# Patient Record
Sex: Female | Born: 1945 | Race: White | Hispanic: No | Marital: Married | Smoking: Never smoker
Health system: Southern US, Community
[De-identification: ages and names within clinical notes are randomized; demographics above are authoritative.]

## PROBLEM LIST (undated history)

## (undated) DIAGNOSIS — I671 Cerebral aneurysm, nonruptured: Secondary | ICD-10-CM

## (undated) DIAGNOSIS — Z9889 Other specified postprocedural states: Secondary | ICD-10-CM

## (undated) DIAGNOSIS — M1990 Unspecified osteoarthritis, unspecified site: Secondary | ICD-10-CM

## (undated) DIAGNOSIS — E119 Type 2 diabetes mellitus without complications: Secondary | ICD-10-CM

## (undated) DIAGNOSIS — F419 Anxiety disorder, unspecified: Secondary | ICD-10-CM

## (undated) DIAGNOSIS — D649 Anemia, unspecified: Secondary | ICD-10-CM

## (undated) DIAGNOSIS — I10 Essential (primary) hypertension: Secondary | ICD-10-CM

## (undated) DIAGNOSIS — I639 Cerebral infarction, unspecified: Secondary | ICD-10-CM

## (undated) DIAGNOSIS — K469 Unspecified abdominal hernia without obstruction or gangrene: Secondary | ICD-10-CM

## (undated) HISTORY — PX: HERNIA REPAIR: SHX51

---

## 1999-08-30 ENCOUNTER — Emergency Department (HOSPITAL_COMMUNITY): Admission: EM | Admit: 1999-08-30 | Discharge: 1999-08-30 | Payer: Self-pay | Admitting: Emergency Medicine

## 1999-09-02 ENCOUNTER — Emergency Department (HOSPITAL_COMMUNITY): Admission: EM | Admit: 1999-09-02 | Discharge: 1999-09-02 | Payer: Self-pay | Admitting: Emergency Medicine

## 1999-10-23 ENCOUNTER — Encounter: Payer: Self-pay | Admitting: Internal Medicine

## 1999-10-23 ENCOUNTER — Ambulatory Visit (HOSPITAL_COMMUNITY): Admission: RE | Admit: 1999-10-23 | Discharge: 1999-10-23 | Payer: Self-pay | Admitting: Internal Medicine

## 2002-05-28 ENCOUNTER — Encounter: Payer: Self-pay | Admitting: Internal Medicine

## 2005-01-20 ENCOUNTER — Ambulatory Visit: Payer: Self-pay | Admitting: Physical Medicine & Rehabilitation

## 2005-01-20 ENCOUNTER — Inpatient Hospital Stay (HOSPITAL_COMMUNITY): Admission: EM | Admit: 2005-01-20 | Discharge: 2005-01-29 | Payer: Self-pay | Admitting: Internal Medicine

## 2005-01-20 ENCOUNTER — Encounter: Payer: Self-pay | Admitting: *Deleted

## 2005-01-20 ENCOUNTER — Ambulatory Visit: Payer: Self-pay | Admitting: Internal Medicine

## 2005-01-21 ENCOUNTER — Encounter (INDEPENDENT_AMBULATORY_CARE_PROVIDER_SITE_OTHER): Payer: Self-pay | Admitting: Cardiology

## 2005-03-25 ENCOUNTER — Ambulatory Visit: Payer: Self-pay | Admitting: Internal Medicine

## 2005-04-08 ENCOUNTER — Ambulatory Visit: Payer: Self-pay | Admitting: Internal Medicine

## 2005-05-07 ENCOUNTER — Ambulatory Visit (HOSPITAL_COMMUNITY): Admission: RE | Admit: 2005-05-07 | Discharge: 2005-05-07 | Payer: Self-pay | Admitting: Interventional Radiology

## 2005-12-20 ENCOUNTER — Ambulatory Visit (HOSPITAL_COMMUNITY): Admission: RE | Admit: 2005-12-20 | Discharge: 2005-12-20 | Payer: Self-pay | Admitting: Interventional Radiology

## 2006-03-12 ENCOUNTER — Ambulatory Visit: Payer: Self-pay | Admitting: Internal Medicine

## 2006-11-19 ENCOUNTER — Ambulatory Visit (HOSPITAL_COMMUNITY): Admission: RE | Admit: 2006-11-19 | Discharge: 2006-11-19 | Payer: Self-pay | Admitting: Interventional Radiology

## 2006-12-09 ENCOUNTER — Encounter (HOSPITAL_BASED_OUTPATIENT_CLINIC_OR_DEPARTMENT_OTHER): Payer: Self-pay | Admitting: General Surgery

## 2006-12-10 ENCOUNTER — Inpatient Hospital Stay (HOSPITAL_COMMUNITY): Admission: EM | Admit: 2006-12-10 | Discharge: 2006-12-13 | Payer: Self-pay | Admitting: Emergency Medicine

## 2007-01-30 ENCOUNTER — Ambulatory Visit: Payer: Self-pay | Admitting: Internal Medicine

## 2007-01-30 DIAGNOSIS — I10 Essential (primary) hypertension: Secondary | ICD-10-CM | POA: Insufficient documentation

## 2007-01-30 DIAGNOSIS — Z8679 Personal history of other diseases of the circulatory system: Secondary | ICD-10-CM | POA: Insufficient documentation

## 2007-01-30 DIAGNOSIS — I671 Cerebral aneurysm, nonruptured: Secondary | ICD-10-CM | POA: Insufficient documentation

## 2007-01-30 LAB — CONVERTED CEMR LAB
Bilirubin Urine: NEGATIVE
Glucose, Urine, Semiquant: NEGATIVE
Nitrite: NEGATIVE
Protein, U semiquant: NEGATIVE
Specific Gravity, Urine: 1.01
Urobilinogen, UA: 0.2
WBC Urine, dipstick: NEGATIVE

## 2007-02-06 ENCOUNTER — Ambulatory Visit: Payer: Self-pay | Admitting: Internal Medicine

## 2007-02-06 LAB — CONVERTED CEMR LAB
Cholesterol, target level: 200 mg/dL
HDL goal, serum: 40 mg/dL
LDL Goal: 130 mg/dL

## 2007-02-11 LAB — CONVERTED CEMR LAB
ALT: 44 units/L — ABNORMAL HIGH (ref 0–35)
AST: 43 units/L — ABNORMAL HIGH (ref 0–37)
Albumin: 4.1 g/dL (ref 3.5–5.2)
Alkaline Phosphatase: 43 units/L (ref 39–117)
BUN: 5 mg/dL — ABNORMAL LOW (ref 6–23)
Basophils Absolute: 0 10*3/uL (ref 0.0–0.1)
Basophils Relative: 1.1 % — ABNORMAL HIGH (ref 0.0–1.0)
Bilirubin, Direct: 0.2 mg/dL (ref 0.0–0.3)
CO2: 28 meq/L (ref 19–32)
Calcium: 9.6 mg/dL (ref 8.4–10.5)
Chloride: 106 meq/L (ref 96–112)
Cholesterol: 168 mg/dL (ref 0–200)
Creatinine, Ser: 0.8 mg/dL (ref 0.4–1.2)
Eosinophils Absolute: 0.3 10*3/uL (ref 0.0–0.6)
Eosinophils Relative: 6 % — ABNORMAL HIGH (ref 0.0–5.0)
GFR calc Af Amer: 95 mL/min
GFR calc non Af Amer: 78 mL/min
Glucose, Bld: 103 mg/dL — ABNORMAL HIGH (ref 70–99)
HCT: 39 % (ref 36.0–46.0)
HDL: 68.4 mg/dL (ref >39.0)
Hemoglobin: 13.5 g/dL (ref 12.0–15.0)
LDL Cholesterol: 82 mg/dL (ref 0–99)
Lymphocytes Relative: 28.9 % (ref 12.0–46.0)
MCHC: 34.7 g/dL (ref 30.0–36.0)
MCV: 94.5 fL (ref 78.0–100.0)
Monocytes Absolute: 0.4 10*3/uL (ref 0.2–0.7)
Monocytes Relative: 9.1 % (ref 3.0–11.0)
Neutro Abs: 2.5 10*3/uL (ref 1.4–7.7)
Neutrophils Relative %: 54.9 % (ref 43.0–77.0)
Platelets: 202 10*3/uL (ref 150–400)
Potassium: 3.9 meq/L (ref 3.5–5.1)
RBC: 4.13 M/uL (ref 3.87–5.11)
RDW: 12 % (ref 11.5–14.6)
Sodium: 143 meq/L (ref 135–145)
TSH: 3.46 microintl units/mL (ref 0.35–5.50)
Total Bilirubin: 1.8 mg/dL — ABNORMAL HIGH (ref 0.3–1.2)
Total CHOL/HDL Ratio: 2.5
Total Protein: 6.8 g/dL (ref 6.0–8.3)
Triglycerides: 86 mg/dL (ref 0–149)
VLDL: 17 mg/dL (ref 0–40)
WBC: 4.5 10*3/uL (ref 4.5–10.5)

## 2007-05-26 ENCOUNTER — Ambulatory Visit: Payer: Self-pay | Admitting: Gastroenterology

## 2007-07-20 ENCOUNTER — Ambulatory Visit: Payer: Self-pay | Admitting: Gastroenterology

## 2007-07-20 ENCOUNTER — Encounter: Payer: Self-pay | Admitting: Internal Medicine

## 2007-07-20 DIAGNOSIS — K5730 Diverticulosis of large intestine without perforation or abscess without bleeding: Secondary | ICD-10-CM | POA: Insufficient documentation

## 2008-08-08 ENCOUNTER — Telehealth: Payer: Self-pay | Admitting: Internal Medicine

## 2008-10-10 ENCOUNTER — Ambulatory Visit: Payer: Self-pay | Admitting: Internal Medicine

## 2008-10-10 LAB — CONVERTED CEMR LAB
ALT: 28 units/L (ref 0–35)
Albumin: 4.2 g/dL (ref 3.5–5.2)
Basophils Absolute: 0 10*3/uL (ref 0.0–0.1)
Blood in Urine, dipstick: NEGATIVE
CO2: 24 meq/L (ref 19–32)
Chloride: 105 meq/L (ref 96–112)
Creatinine, Ser: 0.9 mg/dL (ref 0.4–1.2)
Eosinophils Absolute: 0.3 10*3/uL (ref 0.0–0.7)
Eosinophils Relative: 5.7 % — ABNORMAL HIGH (ref 0.0–5.0)
GFR calc non Af Amer: 67.73 mL/min (ref >60)
HCT: 40.1 % (ref 36.0–46.0)
Hemoglobin: 14.1 g/dL (ref 12.0–15.0)
Ketones, urine, test strip: NEGATIVE
LDL Cholesterol: 98 mg/dL (ref 0–99)
Lymphs Abs: 1 10*3/uL (ref 0.7–4.0)
Monocytes Absolute: 0.5 10*3/uL (ref 0.1–1.0)
Neutro Abs: 3.5 10*3/uL (ref 1.4–7.7)
Platelets: 188 10*3/uL (ref 150.0–400.0)
Specific Gravity, Urine: <1.005
Urobilinogen, UA: 0.2
VLDL: 22.4 mg/dL (ref 0.0–40.0)
WBC: 5.3 10*3/uL (ref 4.5–10.5)

## 2008-10-14 ENCOUNTER — Ambulatory Visit: Payer: Self-pay | Admitting: Internal Medicine

## 2008-10-14 DIAGNOSIS — R7309 Other abnormal glucose: Secondary | ICD-10-CM

## 2009-10-16 ENCOUNTER — Encounter: Payer: Self-pay | Admitting: Internal Medicine

## 2010-05-23 ENCOUNTER — Ambulatory Visit: Payer: Self-pay | Admitting: Internal Medicine

## 2010-05-23 LAB — CONVERTED CEMR LAB
BUN: 17 mg/dL (ref 6–23)
Basophils Relative: 0.5 % (ref 0.0–3.0)
Bilirubin Urine: NEGATIVE
Bilirubin, Direct: 0.2 mg/dL (ref 0.0–0.3)
Blood in Urine, dipstick: NEGATIVE
CO2: 21 meq/L (ref 19–32)
Calcium: 9 mg/dL (ref 8.4–10.5)
Cholesterol: 233 mg/dL — ABNORMAL HIGH (ref 0–200)
Creatinine, Ser: 0.7 mg/dL (ref 0.4–1.2)
Direct LDL: 125.3 mg/dL
Eosinophils Relative: 2.9 % (ref 0.0–5.0)
GFR calc non Af Amer: 85.77 mL/min (ref >60)
Glucose, Urine, Semiquant: NEGATIVE
HCT: 41.6 % (ref 36.0–46.0)
Ketones, urine, test strip: NEGATIVE
Lymphocytes Relative: 15 % (ref 12.0–46.0)
MCHC: 34.4 g/dL (ref 30.0–36.0)
MCV: 98.3 fL (ref 78.0–100.0)
Monocytes Absolute: 0.7 10*3/uL (ref 0.1–1.0)
Monocytes Relative: 9.8 % (ref 3.0–12.0)
Neutro Abs: 5 10*3/uL (ref 1.4–7.7)
Neutrophils Relative %: 71.8 % (ref 43.0–77.0)
Platelets: 205 10*3/uL (ref 150.0–400.0)
RDW: 13.3 % (ref 11.5–14.6)
Sodium: 134 meq/L — ABNORMAL LOW (ref 135–145)
Specific Gravity, Urine: 1.015
Urobilinogen, UA: 0.2
WBC Urine, dipstick: NEGATIVE

## 2010-05-28 ENCOUNTER — Ambulatory Visit: Payer: Self-pay | Admitting: Internal Medicine

## 2010-07-29 ENCOUNTER — Encounter: Payer: Self-pay | Admitting: Interventional Radiology

## 2010-08-07 NOTE — Assessment & Plan Note (Signed)
Summary: cpx//lch   Vital Signs:  Patient profile:   65 year old female Height:      61.5 inches Weight:      177 pounds BMI:     33.02 Temp:     99.0 degrees F oral Pulse rate:   80 / minute Pulse rhythm:   regular BP sitting:   134 / 88  (left arm) Cuff size:   regular  Vitals Entered By: Alfred Levins, CMA (2010/06/25 10:21 AM)  History of Present Illness: CPX has not lost weight as instructed.   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 One Two Times A Day--Needs Office Visit For Additional Refills 2)  Simvastatin 40 Mg  Tabs (Simvastatin) .... One By Mouth Daily 3)  Aspirin 325 Mg  Tbec (Aspirin) .... One Daily 4)  Benazepril Hcl 20 Mg  Tabs (Benazepril Hcl) .... One Daily--Needs Office Visit For Additional Refills 5)  Alprazolam 0.25 Mg Tabs (Alprazolam) .Marland Kitchen.. 1 By Mouth Once Daily As Needed Anxiety 6)  Rogaine Extra Strength 5 %  Soln (Minoxidil) .... Appy Three Times A Day  Allergies (verified): 1)  ! Keflex (Cephalexin) 2)  ! Phenobarbital 3)  ! Penicillin 4)  ! Erythromycin  Past History:  Past Medical History: Last updated: 02/06/2007 Hyperlipidemia Hypertension Cerebrovascular accident, hx of-hemorrhagic cerebral aneurysm abdominal hernia--strangulated hernia  Past Surgical History: Last updated: 08/17/2007 cerebral aneurysm-coiling after stroke strangulated hernia-abdomen-peritonitis required partial colectomy  Family History: Last updated: 06/25/2010 father deceased pnacreatic CA-84 yo mother dementia (living at age 52)  Social History: Last  updated: 02/06/2007 Married Alcohol use-no--she has drunk too much before Regular exercise-yes  Risk Factors: Exercise: yes (02/06/2007)  Risk Factors: Smoking Status: never (01/30/2007)  Family History: father deceased pnacreatic CA-84 yo mother dementia (living at age 40)  Physical Exam  General:  overweight female in no acute distress. HEENT exam atraumatic, normocephalic symmetric her muscles are intact. Neck supple without lymphadenopathy or thyromegaly. Chest auscultation. Cardiac exam S1-S2 are regular. Abdominal exam active bowel sounds, soft, nontender, nondistended. She is overweight. Extremities no clubbing cyanosis or edema. Neurologic exam she is alert and oriented without any motor or sensory deficits. speech is normal. Gait is normal.   Impression & Recommendations:  Problem # 1:  WELL ADULT EXAM (ICD-V70.0) health maint utd needs to exercise regularly lose weight low calorie diet.   Problem # 2:  HYPERGLYCEMIA (ICD-790.29) will need followup  Problem # 3:  CEREBROVASCULAR ACCIDENT, HX OF (ICD-V12.50) no recurrence.  Problem # 4:  DYSARTHRIA-STROKE (ICD-784.5) has essentially resolved.  Problem # 5:  HYPERTENSION (ICD-401.9) care control. Will dramatically improve with weight loss. Her updated medication list for this problem includes:    Labetalol Hcl 100 Mg Tabs (Labetalol hcl) ..... One two times a day--needs office visit for additional refills    Benazepril Hcl 20 Mg Tabs (Benazepril hcl) ..... One daily--needs office visit for additional refills  BP today: 134/88 Prior BP: 148/98 (10/14/2008)  Prior 10 Yr Risk Heart Disease: Not enough information (02/06/2007)  Labs Reviewed: K+: 4.3 (05/23/2010) Creat: : 0.7 (05/23/2010)   Chol: 233 (05/23/2010)   HDL:  99.30 (05/23/2010)   LDL: 98 (10/10/2008)   TG: 98.0 (05/23/2010) discussed weight loss at length. She'll followup with me in 3 months with a goal of a 10-15 pound weight lossduring that  time.  Complete Medication List: 1)  Labetalol Hcl 100 Mg Tabs (Labetalol hcl) .... One two times a day--needs office visit for additional refills 2)  Simvastatin 40 Mg Tabs (Simvastatin) .... One by mouth daily 3)  Aspirin 325 Mg Tbec (Aspirin) .... One daily 4)  Benazepril Hcl 20 Mg Tabs (Benazepril hcl) .... One daily--needs office visit for additional refills 5)  Alprazolam 0.25 Mg Tabs (Alprazolam) .Marland Kitchen.. 1 by mouth once daily as needed anxiety 6)  Rogaine Extra Strength 5 % Soln (Minoxidil) .... Appy three times a day  Preventive Care Screening  Mammogram:    Date:  07/08/2009    Next Due:  05/2012    Results:  normal   Pap Smear:    Date:  05/08/2010    Next Due:  05/2013    Results:  normal-pt's report   Colonoscopy:    Next Due:  07/2017   Patient Instructions: 1)  Please schedule a follow-up appointment in 3 months. 2)  labs one week prior to visit 3)  lipids---272.4 4)  lfts-995.2 5)  bmet-995.2 6)  A1C-250.02 7)       Orders Added: 1)  Est. Patient 40-64 years [99396]    Preventive Care Screening  Mammogram:    Date:  07/08/2009    Next Due:  05/2012    Results:  normal   Pap Smear:    Date:  05/08/2010    Next Due:  05/2013    Results:  normal-pt's report   Colonoscopy:    Next Due:  07/2017

## 2010-08-07 NOTE — Letter (Signed)
Summary: Medical Clearance for Exercise Program  Medical Clearance for Exercise Program   Imported By: Maryln Gottron 10/17/2009 15:39:06  _____________________________________________________________________  External Attachment:    Type:   Image     Comment:   External Document

## 2010-08-22 ENCOUNTER — Other Ambulatory Visit: Payer: Self-pay

## 2010-08-29 ENCOUNTER — Ambulatory Visit: Payer: Self-pay | Admitting: Internal Medicine

## 2010-09-25 ENCOUNTER — Other Ambulatory Visit: Payer: Self-pay

## 2010-10-02 ENCOUNTER — Ambulatory Visit: Payer: Self-pay | Admitting: Internal Medicine

## 2010-10-19 ENCOUNTER — Other Ambulatory Visit: Payer: Self-pay | Admitting: Internal Medicine

## 2010-10-22 ENCOUNTER — Other Ambulatory Visit: Payer: Self-pay | Admitting: *Deleted

## 2010-10-22 NOTE — Telephone Encounter (Signed)
Opened in error

## 2010-10-26 ENCOUNTER — Other Ambulatory Visit: Payer: Self-pay

## 2010-11-01 ENCOUNTER — Other Ambulatory Visit: Payer: Self-pay | Admitting: Internal Medicine

## 2010-11-02 ENCOUNTER — Ambulatory Visit: Payer: Self-pay | Admitting: Internal Medicine

## 2010-11-16 ENCOUNTER — Other Ambulatory Visit: Payer: Self-pay | Admitting: Internal Medicine

## 2010-11-20 NOTE — Op Note (Signed)
NAME:  [name] [name], [name] [name]           ACCOUNT NO.:  [id number]   MEDICAL RECORD NO.:  [id number]          PATIENT TYPE:  INP   LOCATION:  1527                         FACILITY:  [facility]   PHYSICIAN:  Leonie Man, M.D.   DATE OF BIRTH:  11/23/1945   DATE OF PROCEDURE:  12/09/2006  DATE OF DISCHARGE:  12/13/2006                               OPERATIVE REPORT   PREOPERATIVE DIAGNOSIS:  Strangulated umbilical hernia.   POSTOPERATIVE DIAGNOSIS:  Strangulated umbilical hernia.   PROCEDURE:  Exploratory laparotomy, small bowel resection with primary  anastomosis and primary repair of umbilical hernia.   SURGEON:  Leonie Man, M.D.   ASSISTANT:  Alfonse Ras, MD.   ANESTHESIA:  General.   INDICATIONS:  The patient is a very pleasant 65 year old female  presenting to the hospital with a three-day history of worsening  abdominal pain and an incarcerated umbilical hernia.  The patient had  the hernia for approximately four years, did not choose to have it  paired.  She comes in with nausea and vomiting over the past 24 hours.  I was called to evaluate the patient and noted her to have a large  incarcerated hernia with a rather darkened skin overlying the hernia.  The patient is prepared and taken to the operating room after risks and  potential benefits of surgery have been discussed with her.  All  questions answered, consent obtained.   PROCEDURE:  The patient is positioned supinely following the induction  of satisfactory general anesthesia and the abdomen is prepped and draped  to be included in a sterile operative field.  An elliptical incision is  carried down around the umbilicus, deepened through the skin and  subcutaneous tissue, carrying the dissection down to the fascia and the  anterior abdominal wall whereby this was further opened up as to release  the hernia.  Within the been the hernia sac there was a portion of  necrotic small intestine which was mobilized, brought  out into the wound  and this area was divided and transected back to normal small intestine  the intervening mesentery was taken between clamps and secured with ties  of 2-0 silk.  A functional end-to-end anastomosis was carried out with a  GIA and TA-60 stapling device.  Mesenteric defect closed with  interrupted 3-0 silk sutures.  This area was returned to the abdominal  cavity and the abdominal cavity was thoroughly irrigated with multiple  aliquots of normal saline.  No other areas of necrosis were found within  the abdomen.  We chose not to put any mesh repair in because of the  presence of necrotic bowel.  The fascia was then closed transversely  with interrupted sutures of #1 Novofil.  Subcutaneous  tissues were irrigated and the skin was closed with a running  subcuticular stitch of 4-0 Monocryl.  Sterile dressings were applied.  The anesthetic reversed.  The patient removed from the operating room to  the recovery room in stable condition.  Tolerated the procedure well.      Leonie Man, M.D.  Electronically Signed     PB/MEDQ  D:  12/31/2006  T:  12/31/2006  Job:  [id number]   cc:   [facility]

## 2010-11-20 NOTE — H&P (Signed)
NAME:  [name] [name], [name] [name]           ACCOUNT NO.:  [id number]   MEDICAL RECORD NO.:  [id number]          PATIENT TYPE:  OBV   LOCATION:  0110                         FACILITY:  [facility]   PHYSICIAN:  Alfonse Ras, MD   DATE OF BIRTH:  28-Nov-1945   DATE OF ADMISSION:  12/09/2006  DATE OF DISCHARGE:                              HISTORY & PHYSICAL   ADMISSION DIAGNOSES:  Incarcerated umbilical hernia.   REFERRING PHYSICIAN:  Bethann Berkshire, MD.   HISTORY OF PRESENT ILLNESS:  The patient is a very pleasant 65 year old  white female with a 3 to 4-day history of worsening abdominal discomfort  and umbilical hernia.  The patient has had this hernia for about 4 years  and has chosen not to have it repaired. She has had nausea and vomiting,  particularly over the last 24 hours.  She was seen by Dr. Valeria Batman,  and I was called to evaluate the patient.   PAST MEDICAL HISTORY:  Significant for CVA in July 2006, and she had a  coil angioplasty done at that time.   ALLERGIES:  IV DYE.   MEDICATIONS:  1. Labetalol 100 mg daily.  2. Simvastatin.  3. Benazepril.   PHYSICAL EXAMINATION:  VITAL SIGNS:  On admission, her blood pressure  was 77/34; however, now it is 142/80.  Temperature was 98.6 on  admission. Heart rate was 118 and is now down to 86.  Respiratory rate  is 16.  HEENT:  Exam benign, normocephalic and atraumatic.  Pupils equal, round,  and reactive to light.  NECK:  Supple and soft without thyromegaly or cervical adenopathy.  LUNGS:  Clear to auscultation and percussion x2.  ABDOMEN:  Shows a very large umbilical hernia which is incarcerated.  Even with conscious sedation, I am unable to reduce it.  EXTREMITIES:  No clubbing, cyanosis, or edema.   White blood cell count 9.1.  Electrolytes are all within normal limits.   KUB does not show evidence of small-bowel obstruction.   IMPRESSION:  Incarcerated umbilical hernia.   PLAN:  Repair with possible mesh during this  hospitalization.      Alfonse Ras, MD  Electronically Signed     KRE/MEDQ  D:  12/09/2006  T:  12/09/2006  Job:  [id number]

## 2010-11-20 NOTE — Discharge Summary (Signed)
NAME:  [name] [name], [name] [name]           ACCOUNT NO.:  [id number]   MEDICAL RECORD NO.:  [id number]          PATIENT TYPE:  INP   LOCATION:  1527                         FACILITY:  [facility]   PHYSICIAN:  Leonie Man, M.D.   DATE OF BIRTH:  Jan 14, 1946   DATE OF ADMISSION:  12/09/2006  DATE OF DISCHARGE:  12/13/2006                               DISCHARGE SUMMARY   ADMISSION DIAGNOSIS:  Strangulated umbilical hernia.   DISCHARGE DIAGNOSIS:  Strangulated umbilical hernia.   PROCEDURES IN HOSPITAL:  1. Small bowel resection.  2. Repair of strangulated umbilical hernia.   COMPLICATIONS:  None.   CONDITION ON DISCHARGE:  Improved.   HISTORY AND HOSPITAL COURSE:  The patient is a 65 year old female with a  history of worsening abdominal pain and known umbilical hernia, which  she chose not to get fixed over the past several years.  Upon admission,  the patient was noted to be having nausea and vomiting for the ensuing  24 hours.  On seeing the patient, she was noted to have a very red,  angry umbilicus, and she was taken to the operating room immediately for  emergent surgery.  At the time of surgery, she was noted to have  gangrenous bowel within the strangulated portion of the hernia.  This  was resected at the time of surgery, and the hernia was repaired.  Her  postoperative course has been benign with normal resumption of diet and  activity, and she is being discharged now to be followed up in the  office in two weeks.   DISCHARGE MEDICATIONS:  Mepergan one to two every four hours p.r.n.   ACTIVITY:  As tolerated.   DIET:  Unrestricted.      Leonie Man, M.D.  Electronically Signed     PB/MEDQ  D:  02/05/2007  T:  02/06/2007  Job:  [id number]

## 2010-11-20 NOTE — Assessment & Plan Note (Signed)
[facility]                         GASTROENTEROLOGY OFFICE NOTE   NAME:[name], [name] PAVONE                  MRN:          [id number]  DATE:05/26/2007                            DOB:          [birth year]    REFERRING PHYSICIAN:  Valetta Mole. Swords, MD   REASON FOR REFERRAL:  Dr. Cato Mulligan asked me to evaluate [name] [name] in  consultation regarding colorectal cancer screening with colonoscopy.   HISTORY OF PRESENT ILLNESS:  [name] [name] is a very pleasant, 65 year old  woman who had an incarcerated and strangulated umbilical hernia this  past summer. She was admitted with abdominal pain, nausea and vomiting  and was found to have an incarcerated umbilical hernia and went for  emergency surgery. She had 12 cm of small bowel resected, a primary  anastomosis and a repair of her umbilical hernia. Her postoperative  course was fairly routine although she did have a small hematoma and  some infection that required 2-3 weeks of antibiotics. The past 2-3  months she has felt very well. She has no troubles with constipation,  diarrhea or rectal bleeding. She never had a colonoscopy for colorectal  cancer screening.   REVIEW OF SYSTEMS:  Notable for an 80 pound weight loss in the past 2  years. This is all intentional. She had an aneurysm in her brain in 2006  and since then has really been very health conscious. The rest of her  review of systems essentially normal and is available on her nursing  intake sheet.   PAST MEDICAL HISTORY:  Cranial aneurysm which bled in 2006 treated with  coiling. Hypertension, elevated cholesterol, anxiety, umbilical hernia  repaired after strangulation.   CURRENT MEDICATIONS:  Labetalol, simvastatin, aspirin, benazepril and  alprazolam.   SOCIAL HISTORY:  Married with 2 children, works as a Runner, broadcasting/film/video, nonsmoker,  drinks wine occasionally.   FAMILY HISTORY:  Sister with Crohn's disease. No colon cancer in family.   PHYSICAL  EXAMINATION:  VITAL SIGNS:  5 foot 5 inches, 149 pounds, blood  pressure 108/68, pulse 72.  CONSTITUTIONAL:  Generally well-appearing.  NEUROLOGIC:  Alert and oriented x3.  EYES:  Extraocular movements intact.  MOUTH:  Oropharynx moist, no lesions.  NECK:  Supple, no lymphadenopathy.  CARDIOVASCULAR:  Heart regular rate and rhythm.  LUNGS:  Clear to auscultation bilaterally.  ABDOMEN:  Soft, nontender, nondistended, normal bowel sounds. Small  horizontal low abdominal incision is healing nicely.  SKIN:  No rashes or lesions on visible extremities.  EXTREMITIES:  No lower extremity edema.   ASSESSMENT/PLAN:  A 65 year old woman at routine risk for colorectal  cancer.   I do not think that her recent strangulated umbilical hernia surgery  should impact a colonoscopy. We will therefore arrange for her to have a  colonoscopy performed at her soonest convenience. She wants to wait  until after her son's wedding which is about 3 weeks from now. I see no  reason for any further lab tests or imaging studies prior to then.     Rachael Fee, MD  Electronically Signed    DPJ/MedQ  DD: 05/26/2007  DT: 05/27/2007  Job #:  [id number]   cc:   Valetta Mole. Swords, MD

## 2010-11-23 ENCOUNTER — Other Ambulatory Visit: Payer: Self-pay

## 2010-11-23 NOTE — Consult Note (Signed)
NAME:  [name] [name], [name] [name]           ACCOUNT NO.:  [id number]   MEDICAL RECORD NO.:  [id number]          PATIENT TYPE:  OUT   LOCATION:  XRAY                         FACILITY:  [facility]   PHYSICIAN:  Sanjeev K. Deveshwar, M.D.DATE OF BIRTH:  [birth year]   DATE OF CONSULTATION:  02/18/2005  DATE OF DISCHARGE:                                   CONSULTATION   This is a very pleasant 65 year old female who was admitted to [facility] on January 20, 2005, by Dr. Felicity Coyer after the patient presented with  stroke-like symptoms consisting of mild aphasia and right facial weakness as  well as some dysarthria.  A CT scan was performed that showed a possible  subacute left CVA.  The patient was seen by Dr. Orlin Hilding and admitted.  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  Dr. Corliss Skains was asked to see the patient in consultation. She was seen on  January 25, 2005, and arrangements were made for coiling of the aneurysm to be  performed January 28, 2005.  The patient did undergo coiling of the aneurysm on  the above date.  She tolerated this well.  The aneurysm was successfully  obliterated, and the patient was discharged from the hospital on January 29, 2005, in improved and stable condition.   On admission to the hospital initially, the patient's blood pressure was  severely elevated at 234/122.  This was treated during her hospital stay.  She was also noted to have some spasm of her cerebral arteries during her  angiogram, and this was treated with nimodipine.   PAST MEDICAL HISTORY:  Significant for:  1.  Hypertension.  2.  Hyperlipidemia.  3.  Above-noted aneurysm with possible CVA.   ALLERGIES:  PENICILLIN and ERYTHROMYCIN.   CURRENT MEDICATIONS:  1.  Labetalol 100 mg b.i.d.  2.  Aspirin 325 mg daily.  3.  Lotensin 10 mg daily.  4.  Nimodipine 60 mg q.4h.  5.  Zocor 20 mg at bedtime.   In the office today, the patient is accompanied by her husband.  She  states  she is doing well.  She still has some residual aphasia with some word-  finding problems on occasion.  Overall, she feels her speech is improving.  She did meet with a speech therapist who felt that no formal speech therapy  was indicated at this time; however, if the patient did not show  considerable improvement, she felt that speech therapy might be a  consideration in the not too distant future.   The patient apparently had appointment to see Dr. Cato Mulligan in followup;  however, she missed this as she was at the beach.  She has been having her  blood pressure monitored by a home health nurse, and apparently her blood  pressure has been under good control.  She is due to see Dr. Pearlean Brownie back  September 25. Dr. Corliss Skains has recommended that the patient have  transcranial Dopplers performed to look for further spasm and to see if the  patient could come off the nimodipine.   Dr. Corliss Skains also  thought the patient might benefit from MRI/MRA.  He also  recommended a repeat angiogram three months following her initial coiling on  July 24.  Overall, the patient was felt to be doing well at this time.  Called Dr. Marlis Edelson office today to confirm her followup appointment with  him.  We have also recommended that she reschedule with Dr. Cato Mulligan.   It should be noted that greater than 30 minutes was spent on this consult  today.      Markus.Osmond   DR/MEDQ  D:  02/18/2005  T:  02/18/2005  Job:  [id number]   cc:   Valetta Mole. Swords, M.D. [facility]   Pramod P. Pearlean Brownie, MD  Fax: [phone]

## 2010-11-23 NOTE — H&P (Signed)
NAME:  [name] [name], [name] [name] NO.:  [id number]   MEDICAL RECORD NO.:  [id number]          PATIENT TYPE:  EMS   LOCATION:  ED                           FACILITY[facility]  [facility]   PHYSICIAN:  Wanda Plump, MD [facility]    DATE OF BIRTH:  1945-07-23   DATE OF ADMISSION:  01/20/2005  DATE OF DISCHARGE:                                HISTORY & PHYSICAL   ADDENDUM:  I have discussed the case of [name] with the stroke team.  We will  transfer her to [facility] We will aim toward blood pressure goal of around  160/100 until we rule out tight intracranial arterial stenosis.       JEP/MEDQ  D:  01/20/2005  T:  01/21/2005  Job:  [id number]

## 2010-11-23 NOTE — Consult Note (Signed)
NAME:  [name] [name], [name] [name]           ACCOUNT NO.:  [id number]   MEDICAL RECORD NO.:  [id number]          PATIENT TYPE:  INP   LOCATION:  3022                         FACILITY:  [facility]   PHYSICIAN:  Sanjeev K. Deveshwar, M.D.DATE OF BIRTH:  1945/07/25   DATE OF CONSULTATION:  01/25/2005  DATE OF DISCHARGE:                                   CONSULTATION   BRIEF HISTORY:  This is a pleasant 65 year old female who has a previous  history of hypertension but has, otherwise, been fairly healthy.  She  presented to [facility] Emergency Room on January 20, 2005, and was  admitted by Dr. Drue Novel with stroke like symptoms consisting of mild aphasia,  right facial weakness, and dysarthria.  Her symptoms resolved within the  hour except for the aphasia, although in the emergency room, CT scan showed  a possible subacute left CVA.  The patient was seen in consultation by Dr.  Orlin Hilding who felt that the patient may have suffered a left CVA, as well.  The patient had an MRA as well as an MRI performed.  This showed marked  tortuosity of both proximal internal carotid arteries as well as the  proximal vertebral arteries, there was focal stenosis of the proximal left  A1 segment, there were atherosclerotic changes of the M1 segment with  diminished flow distally and focal hemorrhage at the M2 bifurcation.  The  patient was seen by Dr. 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  It was felt that this  could possibly represent vasospasm.  The findings were discussed with the  patient and a decision has been made to proceed with coiling of the  aneurysm.  This has been scheduled for January 28, 2005.   PAST MEDICAL HISTORY:   Significant for hypertension, the patient has  previously been on an anti-hypertensive medication, however, she had stopped  this on her own and at the time of admission, her blood pressure was  initially 234/122.  She has had a lipid profile during her stay which  reveals hyperlipidemia.  She had a 2D echo performed July 17 that reveals  ejection fraction of 55-65%.  There was also heavy mitral annular  calcification of the posterior mitral valve annulus and mild to moderate  calcification of the anterior mitral valve annulus.  She apparently is going  through menopause at this time, as well.   ALLERGIES:  Penicillin and erythromycin.   CURRENT MEDICATIONS:  Labetalol, aspirin, Lotensin, Nimodipine, and Zocor.   PAST SURGICAL HISTORY:  The patient has had no surgeries.   SOCIAL HISTORY:  The patient is married, she lives in [place].  She has  two children.  She has never smoked.  She drinks alcohol occasionally.  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   FAMILY HISTORY:  Both parents are alive and in their 12s, her mother has  Alzheimer's dementia, her father is healthy and still works.   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  The aphasia continues.   PHYSICAL EXAMINATION:  GENERAL:  Pleasant 65 year old white female in no acute distress.  VITAL SIGNS:  Blood pressure 145/86, pulse 70, respirations 20, temperature  98.2, oxygen saturation 97% on room air.  HEENT:  Unremarkable.  NECK:  No bruits, no jugular venous distention.  HEART:  Regular rate and rhythm without murmur.  LUNGS:  Clear.  ABDOMEN:  Slightly obese, soft, nontender.  EXTREMITIES:  Pulses to be weak but intact, there is no  significant edema.  SKIN:  Cool and dry.  NEUROLOGICAL:  The patient is alert and oriented, follows commands.  She has  some word finding difficulties.  Cranial nerves 2-12 are grossly intact.  Sensation is intact to light touch.  Motor strength is approximately 4-5/5,  equal and symmetrical, upper and lower extremities and right and left.  Cerebellar testing is intact.  Her airway is rated at a 2 to 3.  Her ASA  scale is a 2.   IMPRESSION:  1.  Recent stroke like symptoms resolved except for mild residual aphasia.  2.  Cerebral angiogram January 22, 2005, revealing a left middle cerebral      artery aneurysm with question of vasospasm, please see details as noted      above.  3.  History of hypertension recently untreated.  4.  Hyperlipidemia.  5.  Recent echo with normal ejection fraction with mitral annular      calcification.  6.  Menopause.  7.  Allergies to penicillin and erythromycin.   PLAN:  As noted, the risks and benefits of aneurysm coiling have been  discussed with the patient.  She has agreed to proceed.  This has been  scheduled for January 28, 2005, to be performed by Dr. Corliss Skains under general  anesthesia.      Markus.Osmond   DR/MEDQ  D:  01/25/2005  T:  01/25/2005  Job:  [id number]   cc:   Valetta Mole. Swords, M.D. [facility] A. Orlin Hilding, M.D.  [address]  [address]  [place]  [place] [address]  Fax: [phone]

## 2010-11-23 NOTE — Consult Note (Signed)
NAME:  [name] [name], [name] [name]           ACCOUNT NO.:  [id number]   MEDICAL RECORD NO.:  [id number]          PATIENT TYPE:  INP   LOCATION:  3102                         FACILITY:  [facility]   PHYSICIAN:  Gustavus Messing. Orlin Hilding, M.D.DATE OF BIRTH:  03-17-1946   DATE OF CONSULTATION:  01/20/2005  DATE OF DISCHARGE:                                   CONSULTATION   CHIEF COMPLAINT:  Right facial droop and garbled speech.   HISTORY OF PRESENT ILLNESS:  Ms. Uber is a 65 year old right-handed white  woman with a history of hypertension which has not been treated for the last  2 years with about a 24-hour history of right facial weakness and garbled  speech and problems with writing.  She denies headache, vision changes, or  extremity weakness.   REVIEW OF SYSTEMS:  Positive for some flu-like symptoms earlier this week.   PAST MEDICAL HISTORY:  Significant for the hypertension which has not been  treated for the last week.  She has obesity.  No history of diabetes,  coronary artery disease.  No surgery, no previous stroke.   MEDICATIONS:  She is not taking any routinely.   ALLERGIES:  PENICILLIN, possibly ERYTHROMYCIN.   SOCIAL HISTORY:  She is married.  She does drink alcohol, two drinks per  day.  No cigarette use.   FAMILY HISTORY:  Noncontributory.   PHYSICAL EXAMINATION:  VITAL SIGNS:  Temperature 99.4, pulse 80,  respirations 18, blood pressure 212/120 currently.  HEENT:  Head is normocephalic and atraumatic.  NECK:  Supple without bruits.  HEART:  Regular rate and rhythm.  NEUROLOGY:  Mental status; she is awake and alert.  She has fluent speech,  but with many paraphasic errors.  Poor name, poor repetition, and mildly  impaired comprehension.  Clearly aphasic.  Cranial nerves; her pupils are  equal and reactive.  Visual fields are full.  Extraocular movements are  intact.  Facial sensation is normal at present without any obvious facial  droop.  Hearing is intact.  Palate is  symmetric and tongue is midline.  Motor examination; there is no drift in the upper or lower extremities.  She  has normal rapid eye movement.  No orbiting.  She has normal strength of 5/5  strength in all four extremities.  Deep tendon reflexes are 1+ with  downgoing toes bilaterally.  Coordination; finger-to-nose and heel-to-shin  are intact.  Sensory is normal.   CT scan of the brain shows no definite acute abnormalities, but there is a  question of a posterior left temporal hypodensity.   IMPRESSION:  1.  Left brain event, likely infarct with aphasia.  No weakness.  2.  Hypertension.  It may be that she has a tight stenosis in the distal      left ICA or MCA and requires high perfusion pressures.   RECOMMENDATIONS:  Aspirin 325 mg daily until workup is complete.  MRI, MRA,  two-dimensional echocardiogram.  I would not be too aggressive at lowering  the blood pressure as she may require higher perfusion pressures, but would  certainly address this more aggressively once the etiology of her stroke  is  obvious.       CAW/MEDQ  D:  01/20/2005  T:  01/21/2005  Job:  [id number]

## 2010-11-23 NOTE — Discharge Summary (Signed)
NAME:  KEIANNA, SIGNER           ACCOUNT NO.:  [id number]   MEDICAL RECORD NO.:  [id number]          PATIENT TYPE:  INP   LOCATION:  3107                         FACILITY:  [facility]   PHYSICIAN:  Rene Paci, M.D. LHCDATE OF BIRTH:  19-Feb-1946   DATE OF ADMISSION:  01/20/2005  DATE OF DISCHARGE:  01/29/2005                                 DISCHARGE SUMMARY   DISCHARGE DIAGNOSES:  1.  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  2.  Dyslipidemia.  3.  Hypertension.   HISTORY OF PRESENT ILLNESS:  The patient is a 65 year old female with past  medical history including hypertension who presented to [facility] Emergency  Room on January 20, 2005, with stroke-like symptoms consisting of mild aphagia,  right facial weakness and dysarthria.  The patient underwent the CT scan in  the ED which showed possible subacute left CVA.  The patient also described  at the time of admission flu-like symptoms for 7-8 days prior to admission.  The patient was admitted for further evaluation.   PAST MEDICAL HISTORY:  Hypertension.   HOSPITAL COURSE:  #1 - LMCA BRANCH INFARCT FROM LMCA ANEURYSM STATUS POST  EMBOLIZATION January 28, 2005.  Patient underwent CT of the head in the  emergency room which showed an area of low attenuation in the posterior left  temporal region consistent with acute to subacute ischemia.  A neurology  consult was obtained, and the patient also underwent an MRA of the head and  neck which revealed a diffuse irregularity of the left M2 segments with  attenuating flow in the distal M2-M3 branches with focal hemorrhage of the  M2 bifurcation.  Neurology felt that the patient likely had an embolic LMCA  branch infarct.  However, a 2D echo was performed which should not reveal  any thrombus and revealed a normal ejection fraction of 55-65%.  The patient  underwent carotid artery Duplex which showed no stenosis.  Interventional  radiology was  consulted and an angiogram was performed which revealed a 9  mmx67mm saccular aneurysm in the left MCA trifurcation with irregularity in  the fundus.   It was suggested that the patient undergo an embolization of the aneurysm.  However, it was necessary for the patient to wait 7-10 days from time of  initial event prior to embolization.  As a result, the patient was  maintained for optimization of blood pressure control and close monitoring.  The patient underwent embolization on January 28, 2005, with Dr. Corliss Skains from  Interventional Radiology.  At time of discharge, the patient has no focal  neurological deficits.   #2 - HYPERTENSION.  The patient was placed on Labetalol, Lotensin and  Nimodipine for blood pressure control.  Per recommendation of Neurology, the  patient is to be maintained on current medications for blood pressure  management.  Systolic blood pressure goal is 120-140.  In addition, the  patient will be discharged home on aspirin per request of Neurology.   #3 - DYSLIPIDEMIA.  The patient was started on Statin.  Will be discharged  home on Zocor  20 mg q.h.s.   DISCHARGE MEDICATIONS:  1.  Labetalol 100 mg p.o. b.i.d., prescription given for 60 tablets and one      refill.  2.  Aspirin 325 mg p.o. daily.  3.  Lotensin 10 mg p.o. daily.  Prescription given for 30 tablets with one      refill.  4.  Nimodipine 60 mg p.o. q.4h.  Prescription given for 180 tablets with one      refill.  5.  Zocor 20 mg p.o. q.h.s.  Prescription given for 30 tablets with one      refill.   LABORATORY DATA:  At time of discharge, hemoglobin 11.8, hematocrit 34.2,  white blood cell count 6.3, platelets 280,000.  BUN 4, creatinine 0.9,  glucose 136.   FOLLOWUP:  1.  The patient is to follow up with Dr. Corliss Skains on August 14 at 2:00 p.m.  2.  The patient is to follow up with Dr. Birdie Sons on August 10 at 10:15      a.m.  3.  The patient is to follow up with Dr. Pearlean Brownie from Neurology on  September      25 at 12 noon.  She is to arrive at 11:30 a.m. for that appointment.   DISCHARGE INSTRUCTIONS:  The patient will need very close monitoring of her  blood pressure as an outpatient.       MSO/MEDQ  D:  01/29/2005  T:  01/29/2005  Job:  [id number]   cc:   Grandville Silos. Corliss Skains, M.D.  [address] [place]  [place]  [place] [address]  Fax: [phone]   Pramod P. Pearlean Brownie, MD  Fax: [phone]   Valetta Mole. Swords, M.D. [facility]

## 2010-11-23 NOTE — H&P (Signed)
NAME:  [name], [name] NO.:  [id number]   MEDICAL RECORD NO.:  [id number]          PATIENT TYPE:  EMS   LOCATION:  ED                           FACILITY[facility]  [facility]   PHYSICIAN:  Wanda Plump, MD [facility]    DATE OF BIRTH:  [birth year]   DATE OF ADMISSION:  01/20/2005  DATE OF DISCHARGE:                                HISTORY & PHYSICAL   PRIMARY CARE PHYSICIAN:  Dr. Cato Mulligan at [facility].   CHIEF COMPLAINT:  Stroke.   HISTORY OF PRESENT ILLNESS:  [name] is a 65 year old white female who  came to the emergency room for evaluation of a possible stroke.  She is here  with her husband.  They state that, yesterday, she developed right facial  weakness along with difficulty finding her words, what seems to be  expressive aphagia.  The facial weakness resolved within an hour and the  difficulty finding her words improved gradually to a point today that it is  almost gone.  At the emergency room, she was found to have an acute to  subacute stroke on the CT and she will be admitted.   PAST MEDICAL HISTORY:  1.  Hypertension.  The E-chart showed that she had a negative captopril      renal scan in 2001.  2.  She is G2, P2.  3.  She is on her menopause.   FAMILY HISTORY:  1.  Sister has diabetes and cardiovascular diseases.  2.  Grandmother had breast cancer.  3.  No history of colon cancer.  4.  Questionable history of lung cancer.   SOCIAL HISTORY:  Does not smoke and drinks socially.   REVIEW OF SYSTEMS:  The patient and her husband had flu-like symptoms for  the last 7-8 days characterized by fever, on and off, nausea.  They had  vomiting and diarrhea but that had subsided.  Overall, she is feeling  better.  She did take naproxen yesterday for above symptoms.  She denies any  recent headache, dizziness, diplopia or bladder or bowel incontinence.  No  chest pain, shortness of breath or cough.   MEDICATIONS:  She was supposed to be on hypertensive medicine  but she quit a  long time ago.   ALLERGIES:  1.  PENICILLIN.  2.  Questionable ERYTHROMYCIN allergies.   PHYSICAL EXAMINATION:  VITAL SIGNS:  The patient has a temperature of 100.4.  Pulse initially was 102, after labetalol is 74.  Blood pressure initially  was 234/122 and repeated blood pressures is 186/121.  Respirations 20.  O2  saturation 100% on room air.  GENERAL:  The patient is alert, oriented, and in no apparent distress.  NECK:  She has good carotid pulses bilaterally.  LUNGS:  Clear to auscultation bilaterally.  CARDIOVASCULAR:  Regular rate and rhythm without a murmur.  ABDOMEN:  Not distended, soft, good bowel sounds and no organomegaly.  EXTREMITIES:  She has good bilateral femoral and pedal pulses.  No edema.  NEUROLOGIC:  Extraocular movements are intact.  Pupils are equal and  reactive to light and accommodation.  Motor exam is normal including  face  and tongue.  Her memory seems intact.  Her speech seems normal at this time.   LABORATORY DATA:  CT of the head showed wedge-like changes at the left  temporal area consistent with acute to subacute ischemic stroke with some  edema.  White count is 6.2, hemoglobin 14.3, platelets 259, creatinine 0.9,  potassium 3.2.  Blood sugar 114.  LFTs are normal.  INR is normal.  EKG is  negative.   ASSESSMENT AND PLAN:  1.  The patient has developed a stroke.  She will be admitted.  We will get      an echo and carotid ultrasound as well as a fasting lipid profile.  Will      start aspirin at some point.  Will get her blood pressure under better      control.  At this time, the short-term goal will be to be around 140/90.  2.  Hypertension.  The patient is aware that from now on she needs good      blood pressure control to prevent another stroke.  Long-term blood      pressure goal is 120/80.  3.  She does have a low-grade temperature with recent flu-like symptoms      that, according to the patient, are resolving.   Consequently, we will      simply monitor her temperature.  The white count is normal.  4.  Will consult neurology today.       JEP/MEDQ  D:  01/20/2005  T:  01/21/2005  Job:  [id number]   cc:   Valetta Mole. Swords, M.D. [facility]

## 2010-11-30 ENCOUNTER — Ambulatory Visit: Payer: Self-pay | Admitting: Internal Medicine

## 2011-04-25 LAB — CBC
HCT: 36.9
HCT: 50.5 — ABNORMAL HIGH
MCHC: 34.9
MCV: 93.7
Platelets: 181
RBC: 5.39 — ABNORMAL HIGH
RDW: 11.9
WBC: 9.1

## 2011-04-25 LAB — DIFFERENTIAL
Basophils Absolute: 0
Eosinophils Relative: 1
Lymphocytes Relative: 16
Lymphocytes Relative: 6 — ABNORMAL LOW
Lymphs Abs: 0.5 — ABNORMAL LOW
Monocytes Absolute: 0.7
Monocytes Relative: 8
Neutro Abs: 6.5
Neutro Abs: 7.4

## 2011-04-25 LAB — CULTURE, BLOOD (ROUTINE X 2)

## 2011-04-25 LAB — COMPREHENSIVE METABOLIC PANEL
AST: 39 — ABNORMAL HIGH
Albumin: 3.1 — ABNORMAL LOW
Alkaline Phosphatase: 29 — ABNORMAL LOW
BUN: 19
BUN: 36 — ABNORMAL HIGH
CO2: 21
Calcium: 8.8
Chloride: 91 — ABNORMAL LOW
Creatinine, Ser: 1.18
Creatinine, Ser: 3.25 — ABNORMAL HIGH
GFR calc non Af Amer: 15 — ABNORMAL LOW
Potassium: 4.3
Total Bilirubin: 2.4 — ABNORMAL HIGH
Total Protein: 5.5 — ABNORMAL LOW

## 2011-06-21 ENCOUNTER — Other Ambulatory Visit: Payer: Self-pay | Admitting: Internal Medicine

## 2011-07-25 ENCOUNTER — Other Ambulatory Visit: Payer: Self-pay

## 2011-08-05 ENCOUNTER — Encounter: Payer: Self-pay | Admitting: Internal Medicine

## 2011-12-05 ENCOUNTER — Other Ambulatory Visit: Payer: Self-pay | Admitting: Internal Medicine

## 2012-01-13 ENCOUNTER — Other Ambulatory Visit: Payer: Self-pay

## 2012-01-13 ENCOUNTER — Ambulatory Visit: Payer: Self-pay | Admitting: Internal Medicine

## 2012-01-20 ENCOUNTER — Inpatient Hospital Stay (HOSPITAL_COMMUNITY)
Admission: EM | Admit: 2012-01-20 | Discharge: 2012-01-28 | DRG: 581 | Disposition: A | Discharge status: Home / Self Care | Payer: BC Managed Care – PPO | Attending: Internal Medicine | Admitting: Internal Medicine

## 2012-01-20 ENCOUNTER — Inpatient Hospital Stay (HOSPITAL_COMMUNITY): Payer: BC Managed Care – PPO

## 2012-01-20 ENCOUNTER — Emergency Department (HOSPITAL_COMMUNITY): Payer: BC Managed Care – PPO

## 2012-01-20 ENCOUNTER — Encounter (HOSPITAL_COMMUNITY): Payer: Self-pay | Admitting: Emergency Medicine

## 2012-01-20 ENCOUNTER — Encounter: Payer: Self-pay | Admitting: Internal Medicine

## 2012-01-20 DIAGNOSIS — E876 Hypokalemia: Secondary | ICD-10-CM | POA: Diagnosis present

## 2012-01-20 DIAGNOSIS — K439 Ventral hernia without obstruction or gangrene: Secondary | ICD-10-CM | POA: Diagnosis present

## 2012-01-20 DIAGNOSIS — I959 Hypotension, unspecified: Secondary | ICD-10-CM

## 2012-01-20 DIAGNOSIS — A419 Sepsis, unspecified organism: Principal | ICD-10-CM

## 2012-01-20 DIAGNOSIS — R109 Unspecified abdominal pain: Secondary | ICD-10-CM

## 2012-01-20 DIAGNOSIS — R6520 Severe sepsis without septic shock: Secondary | ICD-10-CM | POA: Diagnosis present

## 2012-01-20 DIAGNOSIS — R112 Nausea with vomiting, unspecified: Secondary | ICD-10-CM

## 2012-01-20 DIAGNOSIS — D649 Anemia, unspecified: Secondary | ICD-10-CM

## 2012-01-20 DIAGNOSIS — Z8673 Personal history of transient ischemic attack (TIA), and cerebral infarction without residual deficits: Secondary | ICD-10-CM

## 2012-01-20 DIAGNOSIS — I671 Cerebral aneurysm, nonruptured: Secondary | ICD-10-CM

## 2012-01-20 DIAGNOSIS — I10 Essential (primary) hypertension: Secondary | ICD-10-CM

## 2012-01-20 DIAGNOSIS — W08XXXA Fall from other furniture, initial encounter: Secondary | ICD-10-CM | POA: Diagnosis present

## 2012-01-20 DIAGNOSIS — N19 Unspecified kidney failure: Secondary | ICD-10-CM

## 2012-01-20 DIAGNOSIS — D62 Acute posthemorrhagic anemia: Secondary | ICD-10-CM

## 2012-01-20 DIAGNOSIS — E870 Hyperosmolality and hypernatremia: Secondary | ICD-10-CM | POA: Diagnosis present

## 2012-01-20 DIAGNOSIS — E872 Acidosis, unspecified: Secondary | ICD-10-CM

## 2012-01-20 DIAGNOSIS — E039 Hypothyroidism, unspecified: Secondary | ICD-10-CM | POA: Diagnosis present

## 2012-01-20 DIAGNOSIS — R6521 Severe sepsis with septic shock: Secondary | ICD-10-CM

## 2012-01-20 DIAGNOSIS — K432 Incisional hernia without obstruction or gangrene: Secondary | ICD-10-CM

## 2012-01-20 DIAGNOSIS — K56609 Unspecified intestinal obstruction, unspecified as to partial versus complete obstruction: Secondary | ICD-10-CM

## 2012-01-20 DIAGNOSIS — E785 Hyperlipidemia, unspecified: Secondary | ICD-10-CM

## 2012-01-20 DIAGNOSIS — Z8679 Personal history of other diseases of the circulatory system: Secondary | ICD-10-CM

## 2012-01-20 DIAGNOSIS — N179 Acute kidney failure, unspecified: Secondary | ICD-10-CM

## 2012-01-20 DIAGNOSIS — R7309 Other abnormal glucose: Secondary | ICD-10-CM

## 2012-01-20 DIAGNOSIS — K5730 Diverticulosis of large intestine without perforation or abscess without bleeding: Secondary | ICD-10-CM

## 2012-01-20 DIAGNOSIS — E878 Other disorders of electrolyte and fluid balance, not elsewhere classified: Secondary | ICD-10-CM | POA: Diagnosis present

## 2012-01-20 DIAGNOSIS — Z79899 Other long term (current) drug therapy: Secondary | ICD-10-CM

## 2012-01-20 DIAGNOSIS — K565 Intestinal adhesions [bands], unspecified as to partial versus complete obstruction: Secondary | ICD-10-CM | POA: Diagnosis present

## 2012-01-20 HISTORY — DX: Essential (primary) hypertension: I10

## 2012-01-20 HISTORY — DX: Cerebral aneurysm, nonruptured: I67.1

## 2012-01-20 HISTORY — DX: Other specified postprocedural states: Z98.890

## 2012-01-20 LAB — BASIC METABOLIC PANEL
BUN: 40 mg/dL — ABNORMAL HIGH (ref 6–23)
Chloride: 93 mEq/L — ABNORMAL LOW (ref 96–112)
GFR calc Af Amer: 15 mL/min — ABNORMAL LOW (ref >90)
GFR calc non Af Amer: 13 mL/min — ABNORMAL LOW (ref >90)
Glucose, Bld: 143 mg/dL — ABNORMAL HIGH (ref 70–99)
Potassium: 3.2 mEq/L — ABNORMAL LOW (ref 3.5–5.1)
Sodium: 136 mEq/L (ref 135–145)

## 2012-01-20 LAB — URINALYSIS, ROUTINE W REFLEX MICROSCOPIC
Specific Gravity, Urine: 1.017 (ref 1.005–1.030)
Urobilinogen, UA: 0.2 mg/dL (ref 0.0–1.0)
pH: 5.5 (ref 5.0–8.0)

## 2012-01-20 LAB — CARDIAC PANEL(CRET KIN+CKTOT+MB+TROPI)
CK, MB: 7.5 ng/mL (ref 0.3–4.0)
Total CK: 374 U/L — ABNORMAL HIGH (ref 7–177)
Troponin I: <0.3 ng/mL (ref <0.30)

## 2012-01-20 LAB — COMPREHENSIVE METABOLIC PANEL
ALT: 21 U/L (ref 0–35)
AST: 50 U/L — ABNORMAL HIGH (ref 0–37)
CO2: 18 mEq/L — ABNORMAL LOW (ref 19–32)
Calcium: 10.2 mg/dL (ref 8.4–10.5)
Chloride: 80 mEq/L — ABNORMAL LOW (ref 96–112)
GFR calc non Af Amer: 13 mL/min — ABNORMAL LOW (ref >90)
Potassium: 3.4 mEq/L — ABNORMAL LOW (ref 3.5–5.1)
Sodium: 131 mEq/L — ABNORMAL LOW (ref 135–145)

## 2012-01-20 LAB — LACTIC ACID, PLASMA: Lactic Acid, Venous: 10.8 mmol/L — ABNORMAL HIGH (ref 0.5–2.2)

## 2012-01-20 LAB — CBC WITH DIFFERENTIAL/PLATELET
Basophils Absolute: 0 10*3/uL (ref 0.0–0.1)
Eosinophils Relative: 0 % (ref 0–5)
HCT: 32.6 % — ABNORMAL LOW (ref 36.0–46.0)
Hemoglobin: 9.5 g/dL — ABNORMAL LOW (ref 12.0–15.0)
Lymphocytes Relative: 8 % — ABNORMAL LOW (ref 12–46)
MCH: 23.7 pg — ABNORMAL LOW (ref 26.0–34.0)
MCHC: 29.1 g/dL — ABNORMAL LOW (ref 30.0–36.0)
MCV: 81.3 fL (ref 78.0–100.0)
RBC: 4.01 MIL/uL (ref 3.87–5.11)

## 2012-01-20 LAB — URINE MICROSCOPIC-ADD ON

## 2012-01-20 LAB — GLUCOSE, CAPILLARY: Glucose-Capillary: 148 mg/dL — ABNORMAL HIGH (ref 70–99)

## 2012-01-20 MED ORDER — SODIUM CHLORIDE 0.9 % IV SOLN
Freq: Once | INTRAVENOUS | Status: AC
  Administered 2012-01-20: 999 mL/h via INTRAVENOUS

## 2012-01-20 MED ORDER — POTASSIUM CHLORIDE 10 MEQ/50ML IV SOLN
10.0000 meq | INTRAVENOUS | Status: AC
  Administered 2012-01-20 – 2012-01-21 (×6): 10 meq via INTRAVENOUS
  Filled 2012-01-20 (×6): qty 50

## 2012-01-20 MED ORDER — ONDANSETRON HCL 4 MG/2ML IJ SOLN
4.0000 mg | Freq: Once | INTRAMUSCULAR | Status: AC
  Administered 2012-01-20: 4 mg via INTRAVENOUS

## 2012-01-20 MED ORDER — ONDANSETRON HCL 4 MG/2ML IJ SOLN
4.0000 mg | Freq: Once | INTRAMUSCULAR | Status: AC
  Administered 2012-01-20: 4 mg via INTRAVENOUS
  Filled 2012-01-20: qty 2

## 2012-01-20 MED ORDER — CHLORHEXIDINE GLUCONATE 0.12 % MT SOLN
15.0000 mL | Freq: Two times a day (BID) | OROMUCOSAL | Status: DC
  Administered 2012-01-20 – 2012-01-21 (×2): 15 mL via OROMUCOSAL
  Filled 2012-01-20 (×2): qty 15

## 2012-01-20 MED ORDER — SODIUM CHLORIDE 0.9 % IV SOLN: 250.0000 mL | INTRAVENOUS | Status: DC | PRN

## 2012-01-20 MED ORDER — PANTOPRAZOLE SODIUM 40 MG IV SOLR: 40.0000 mg | Freq: Every day | INTRAVENOUS | Status: DC

## 2012-01-20 MED ORDER — SODIUM CHLORIDE 0.9 % IV SOLN
INTRAVENOUS | Status: DC
  Administered 2012-01-20: 18:00:00 via INTRAVENOUS
  Administered 2012-01-20: 100 mL/h via INTRAVENOUS
  Administered 2012-01-21 (×2): via INTRAVENOUS
  Administered 2012-01-22: 100 mL/h via INTRAVENOUS
  Administered 2012-01-23: 20:00:00 via INTRAVENOUS

## 2012-01-20 MED ORDER — PANTOPRAZOLE SODIUM 40 MG IV SOLR
40.0000 mg | INTRAVENOUS | Status: DC
  Administered 2012-01-20: 40 mg via INTRAVENOUS
  Filled 2012-01-20 (×2): qty 40

## 2012-01-20 MED ORDER — DOPAMINE-DEXTROSE 3.2-5 MG/ML-% IV SOLN
2.0000 ug/kg/min | Freq: Once | INTRAVENOUS | Status: AC
  Administered 2012-01-20: 5 ug/kg/min via INTRAVENOUS
  Filled 2012-01-20: qty 250

## 2012-01-20 MED ORDER — SODIUM CHLORIDE 0.9 % IV SOLN
250.0000 mg | Freq: Two times a day (BID) | INTRAVENOUS | Status: DC
  Administered 2012-01-20 – 2012-01-22 (×4): 250 mg via INTRAVENOUS
  Filled 2012-01-20 (×6): qty 250

## 2012-01-20 MED ORDER — SODIUM CHLORIDE 0.9 % IV BOLUS (SEPSIS): 2000.0000 mL | Freq: Once | INTRAVENOUS | Status: DC

## 2012-01-20 MED ORDER — ONDANSETRON HCL 4 MG/2ML IJ SOLN
INTRAMUSCULAR | Status: AC
  Filled 2012-01-20: qty 2

## 2012-01-20 MED ORDER — SODIUM CHLORIDE 0.9 % IV SOLN
500.0000 mg | Freq: Three times a day (TID) | INTRAVENOUS | Status: DC
  Filled 2012-01-20 (×3): qty 500

## 2012-01-20 MED ORDER — SODIUM CHLORIDE 0.9 % IV BOLUS (SEPSIS)
2000.0000 mL | Freq: Once | INTRAVENOUS | Status: AC
  Administered 2012-01-20: 2000 mL via INTRAVENOUS

## 2012-01-20 MED ORDER — BIOTENE DRY MOUTH MT LIQD: 15.0000 mL | Freq: Two times a day (BID) | OROMUCOSAL | Status: DC

## 2012-01-20 MED ORDER — NOREPINEPHRINE BITARTRATE 1 MG/ML IJ SOLN
2.0000 ug/min | INTRAVENOUS | Status: DC
  Administered 2012-01-20: 12 ug/min via INTRAVENOUS
  Administered 2012-01-20: 8 ug/min via INTRAVENOUS
  Administered 2012-01-21: 12 ug/min via INTRAVENOUS
  Filled 2012-01-20 (×5): qty 4

## 2012-01-20 MED ORDER — PROMETHAZINE HCL 25 MG/ML IJ SOLN
12.5000 mg | INTRAMUSCULAR | Status: AC
  Administered 2012-01-20: 25 mg via INTRAVENOUS
  Filled 2012-01-20: qty 1

## 2012-01-20 NOTE — ED Notes (Signed)
Dr Preston Fleeting into speak to pt about kidney function and staying in er due to low bp

## 2012-01-20 NOTE — Procedures (Signed)
I reviewed indication with pt, discussed risks & supervised procedure  Khanh Cordner V.

## 2012-01-20 NOTE — ED Notes (Signed)
Pt placed on oxygen [place] (2L) stating at 99%

## 2012-01-20 NOTE — ED Notes (Signed)
Pt undressed, in gown, on monitor, continuous pulse oximetry and blood pressure cuff 

## 2012-01-20 NOTE — ED Notes (Signed)
Left central line placed by steve minor orders noted for admit to ICU pt still vomiting green bile  phenergan ordered from pharm

## 2012-01-20 NOTE — Consult Note (Signed)
Her abdomen is nontender.  I think lab abnormalities and bp are likely due to significant dehydration.  I think further resuscitation by ccm, ng tube, npo, check films and labs in am is best course.  i think this is adhesive and not hernias.  We discussed role of operation for no improvement or worsening.

## 2012-01-20 NOTE — ED Notes (Addendum)
Pt has 20left forearm and has had ~400cc bolus from ems pt staes did take 2 of her bp meds this am and a xanax

## 2012-01-20 NOTE — Consult Note (Signed)
Reason for Consult:Abdomial pain with N/V Referring Physician: R. Amarissa [name] is an 66 y.o. female.  HPI:65/F adm 7/15 with shock, lactic acidosis & acute renal failure on benazepril.  She presented with vomiting x 4ds, husband found her in a couch after a fall, with garbled speech, she reports lightheadedness but no LOC.She hit her head once and hit her knee another time. Husband thought that she had a left facial droop and that her speech was slurred.  This resolved by the time of ED arrival. Found to be hypotenisve, required dopamine after 3 L fluid. Labs showed lactate 10, BUN/ cr 40/3.4 Patient has s/p of abdominal surgery for " resection" not clear if this was large or small bowel resection. Surgery was 6 years ago per patient. Patient states that she has had a 2 week history of N/V and reports that the last time she passed flatus was [place]. No recent BM reported. We are asked toi see the patient for evaluation of her SBO.   CT results: Dilated loops of small bowel concerning for small bowel  obstruction, though an exact transition point is not identified  presumably secondary to either an adhesion or adjacent ventral wall  abdominal hernias.  2. Scattered foci of nondependent air within dilated loopd of  small bowel within the right mid hemiabdomen, while presumably  intraluminal, a small amount of pneumatosis is not excluded. No  definite portal venous gas or pneumoperitoneum.    Past Medical History  Diagnosis Date  . Hypertension   . H/O brain surgery   . Aneurysm of anterior cerebral artery     Past Surgical History  Procedure Date  . Bowel resection     No family history on file.  Social History:  reports that she has never smoked. She does not have any smokeless tobacco history on file. She reports that she drinks alcohol. Her drug history not on file.  Allergies:  Allergies  Allergen Reactions  . Cephalexin Rash  . Penicillins Rash    Medications:  I have reviewed the patient's current medications.  Results for orders placed during the hospital encounter of 01/20/12 (from the past 48 hour(s))  CBC WITH DIFFERENTIAL     Status: Abnormal   Collection Time   01/20/12 11:01 AM      Component Value Range Comment   WBC 10.6 (*) 4.0 - 10.5 K/uL    RBC 4.01  3.87 - 5.11 MIL/uL    Hemoglobin 9.5 (*) 12.0 - 15.0 g/dL    HCT 16.1 (*) 09.6 - 46.0 %    MCV 81.3  78.0 - 100.0 fL    MCH 23.7 (*) 26.0 - 34.0 pg    MCHC 29.1 (*) 30.0 - 36.0 g/dL    RDW 04.5 (*) 40.9 - 15.5 %    Platelets 303  150 - 400 K/uL    Neutrophils Relative 81 (*) 43 - 77 %    Neutro Abs 8.6 (*) 1.7 - 7.7 K/uL    Lymphocytes Relative 8 (*) 12 - 46 %    Lymphs Abs 0.8  0.7 - 4.0 K/uL    Monocytes Relative 11  3 - 12 %    Monocytes Absolute 1.2 (*) 0.1 - 1.0 K/uL    Eosinophils Relative 0  0 - 5 %    Eosinophils Absolute 0.0  0.0 - 0.7 K/uL    Basophils Relative 0  0 - 1 %    Basophils Absolute 0.0  0.0 - 0.1  K/uL   COMPREHENSIVE METABOLIC PANEL     Status: Abnormal   Collection Time   01/20/12 11:01 AM      Component Value Range Comment   Sodium 131 (*) 135 - 145 mEq/L    Potassium 3.4 (*) 3.5 - 5.1 mEq/L    Chloride 80 (*) 96 - 112 mEq/L    CO2 18 (*) 19 - 32 mEq/L    Glucose, Bld 155 (*) 70 - 99 mg/dL    BUN 40 (*) 6 - 23 mg/dL    Creatinine, Ser 1.61 (*) 0.50 - 1.10 mg/dL    Calcium 09.6  8.4 - 10.5 mg/dL    Total Protein 8.6 (*) 6.0 - 8.3 g/dL    Albumin 3.6  3.5 - 5.2 g/dL    AST 50 (*) 0 - 37 U/L    ALT 21  0 - 35 U/L    Alkaline Phosphatase 79  39 - 117 U/L    Total Bilirubin 2.3 (*) 0.3 - 1.2 mg/dL    GFR calc non Af Amer 13 (*) >90 mL/min    GFR calc Af Amer 15 (*) >90 mL/min   TROPONIN I     Status: Normal   Collection Time   01/20/12 11:01 AM      Component Value Range Comment   Troponin I <0.30  <0.30 ng/mL   LACTIC ACID, PLASMA     Status: Abnormal   Collection Time   01/20/12 11:05 AM      Component Value Range Comment   Lactic Acid, Venous  10.8 (*) 0.5 - 2.2 mmol/L   CARDIAC PANEL(CRET KIN+CKTOT+MB+TROPI)     Status: Abnormal   Collection Time   01/20/12  1:52 PM      Component Value Range Comment   Total CK 374 (*) 7 - 177 U/L    CK, MB 7.5 (*) 0.3 - 4.0 ng/mL    Troponin I <0.30  <0.30 ng/mL    Relative Index 2.0  0.0 - 2.5   BASIC METABOLIC PANEL     Status: Abnormal   Collection Time   01/20/12  3:03 PM      Component Value Range Comment   Sodium 136  135 - 145 mEq/L    Potassium 3.2 (*) 3.5 - 5.1 mEq/L    Chloride 93 (*) 96 - 112 mEq/L DELTA CHECK NOTED   CO2 23  19 - 32 mEq/L    Glucose, Bld 143 (*) 70 - 99 mg/dL    BUN 40 (*) 6 - 23 mg/dL    Creatinine, Ser 0.45 (*) 0.50 - 1.10 mg/dL    Calcium 8.2 (*) 8.4 - 10.5 mg/dL    GFR calc non Af Amer 13 (*) >90 mL/min    GFR calc Af Amer 15 (*) >90 mL/min   LACTIC ACID, PLASMA     Status: Normal   Collection Time   01/20/12  3:04 PM      Component Value Range Comment   Lactic Acid, Venous 2.1  0.5 - 2.2 mmol/L   GLUCOSE, CAPILLARY     Status: Abnormal   Collection Time   01/20/12  4:19 PM      Component Value Range Comment   Glucose-Capillary 148 (*) 70 - 99 mg/dL     Ct Abdomen Pelvis Wo Contrast  01/20/2012  *RADIOLOGY REPORT*  Clinical Data: Nausea and vomiting for 4 days, post fall, abdominal distension, reducible hernia, shock, lactic acidosis  CT ABDOMEN AND PELVIS WITHOUT CONTRAST  Technique:  Multidetector CT imaging of the abdomen and pelvis was performed following the standard protocol without intravenous contrast.  Comparison: Chest radiograph - earlier same day; abdominal radiographic series - 12/09/2006  Findings:  The lack of intravenous contrast limits the ability to evaluate solid abdominal organs.  Normal hepatic contour. There is an ill defined approximate 1.6 x 2.1 cm hypoattenuating lesion with the dome of the left lobe of the liver (image 90, series 2) which is too small which is incompletely evaluated without intravenous contrast.  Normal  noncontrast appearance of the gallbladder.  No ascites.  Normal noncontrast appearance of the bilateral kidneys.  No urinary obstruction.  Note is made of opacities measuring approximately 6 mm (image 62, series two) and 4 mm (image 51) adjacent to the expected location of the right ureter.  These findings are without associated upstream ureterectasis and favored to be within the adjacent right gonadal vein (gonadal veins phleboliths).  The urinary bladder is decompressed with a Foley catheter.  Normal noncontrast appearance of the bilateral adrenal glands and pancreas.  Scattered punctate calcifications with an otherwise normal-appearing spleen, favored to be the sequela of prior granulomas infection.  There are two adjacent anterior ventral wall abdominal hernias, one of which demonstrates a wide neck measuring approximately 8.7 cm in greatest transverse axial dimension (image 59) the other of which is narrow neck measuring approximately 3.5 cm (image 52).  Both of these hernias containing dilated loops of small bowel however do not definitely appear to be a transition point.  Of note, the narrow neck hernia does have a minimal amount of mesenteric stranding about its origin (image 52).  The wide neck hernia does contain a short segment of nondilated transverse colon.  The small bowel is diffusely distended to the level of the cecum. The colon is largely decompressed to the level of the cecum (which is incidentally noted within the midline of the lower abdomen (image 59, series 2). Overall findings are compatible small bowel obstruction, though exact transition point is not identified. Postsurgical change within a loop of small bowel within the anterior aspect of the right mid hemiabdomen (images 37 through 44).  There are multiple scattered foci of air within a dilated loop of small bowel within the right mid hemiabdomen.  No pneumoperitoneum or portal venous gas.  Scattered atherosclerotic calcifications within  a mildly tortuous but normal caliber abdominal aorta.  There is a cluster of dilated presumable venous collaterals within the lower abdomen (image 57, series 2) of uncertain etiology or clinical significance.  Scattered colonic diverticulosis without evidence of diverticulitis.  Limited visualization of the lower thorax demonstrates small bilateral pleural effusions, left greater than right. Calcified granuloma within the left lower lobe (image four), the sequela of prior granulomatous infection.  Minimal bibasilar dependent opacities favored to represent atelectasis.  Normal heart size. Small amount of pericardial fluid, presumably physiologic. Extensive calcifications within the mitral valve annulus.  No acute or aggressive osseous abnormalities.  Moderate multilevel lumbar spine degenerative change, worst at L3 - L4, L4 - L5 and L5 - S1.  Bilateral moderate to severe facet degenerative change at L5 - S1.  IMPRESSION:  1. Dilated loops of small bowel concerning for small bowel obstruction, though an exact transition point is not identified presumably secondary to either an adhesion or adjacent ventral wall abdominal hernias. 2.  Scattered foci of nondependent air within dilated loopd of small bowel within the right mid hemiabdomen, while presumably intraluminal, a small amount of pneumatosis is not excluded.  No definite portal venous gas or pneumoperitoneum.  3. Indeterminate 2.1 cm hypoattenuating lesion within the dome of the left lobe the liver, a fully evaluated without intravenous contrast.  Comparison to prior examinations (if available) is recommended.  If no comparisons exist, further evaluation with non emergent contrast enhanced CT or MRI may be performed as clinically indicated.  4.  Likely right-sided gonadal vein phleboliths without evidence of urinary obstruction.  5.  Dilated presumably venous collaterals within the inferior aspect of the abdomen of uncertain etiology or clinical significance.  6.   Lumbar spine degenerative change.  Above findings discussed with Dr. Jamesetta So at [phone].  Original Report Authenticated By: Waynard Reeds, M.D.   Ct Head Wo Contrast  01/20/2012  *RADIOLOGY REPORT*  Clinical Data: Fall.  Slurred speech.  Left facial droop.  CT HEAD WITHOUT CONTRAST  Technique:  Contiguous axial images were obtained from the base of the skull through the vertex without contrast.  Comparison: 01/20/2005.  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 Taking this limitation into account, no evidence of intracranial hemorrhage or CT evidence of large acute infarct.  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  Mild global atrophy without hydrocephalus.  No skull fracture.  Vascular calcifications.  Orbital structures appear to be grossly intact.  Visualized sinuses and mastoid air cells are clear.  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  Taking this limitation into account, no evidence of intracranial hemorrhage or CT evidence of large acute infarct.  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  Original Report Authenticated By: Fuller Canada, M.D.   Dg Chest Port 1 View  01/20/2012  *RADIOLOGY REPORT*  Clinical Data: Central line placement, history hypertension  PORTABLE CHEST - 1 VIEW  Comparison: Portable exam 1347 hours compared to 01/20/2012  Findings: Left jugular line, tip projecting over SVC. Upper normal heart size. Mitral annular calcification noted. Mediastinal contours and pulmonary vascularity normal. Question mild atelectasis at left base. Lungs otherwise clear. No pneumothorax. Bones appear demineralized.  IMPRESSION: No pneumothorax following left jugular line placement. Left basilar atelectasis.  Original Report Authenticated By: Lollie Marrow, M.D.   Dg Chest Portable 1  View  01/20/2012  *RADIOLOGY REPORT*  Clinical Data: Dizziness, hypotension  PORTABLE CHEST - 1 VIEW  Comparison: Portable exam 1052 hours repeated at 1058 hours compared to 12/09/2006  Findings: Rotated to left on both images. Upper normal heart size with mitral annular calcification. Atherosclerotic calcification aorta. Mediastinal contours and pulmonary vascularity normal. Left basilar atelectasis. Lungs otherwise clear. No pleural effusion or pneumothorax. Bones demineralized.  IMPRESSION: Left basilar atelectasis.  Original Report Authenticated By: Lollie Marrow, M.D.    Review of Systems  Constitutional: Negative.   HENT: Negative.   Eyes: Negative.   Respiratory: Negative.   Cardiovascular: Negative.   Gastrointestinal: Positive for nausea, vomiting and constipation. Negative for heartburn, abdominal pain, diarrhea, blood in stool and melena.  Genitourinary: Negative.   Musculoskeletal: Negative.   Skin: Negative.   Neurological: Negative.   Endo/Heme/Allergies: Negative.   Psychiatric/Behavioral: Negative.    Blood pressure 88/41, pulse 96, temperature 97.8 F (36.6 C), temperature source Oral, resp. rate 21, height 5' 1.42" (1.56 m), weight 177 lb 0.5 oz (80.3 kg), SpO2 97.00%. Physical Exam  Constitutional: She is oriented to person, place, and time. She appears well-developed and well-nourished. No distress.  HENT:  Head:  Normocephalic and atraumatic.  Mouth/Throat: No oropharyngeal exudate.  Eyes: Pupils are equal, round, and reactive to light. Right eye exhibits no discharge. Left eye exhibits no discharge. No scleral icterus.  Neck: Normal range of motion. Neck supple. No JVD present. No tracheal deviation present. No thyromegaly present.  Cardiovascular: Normal rate, regular rhythm and normal heart sounds.  Exam reveals no gallop and no friction rub.   No murmur heard. Respiratory: Effort normal and breath sounds normal. No stridor. No respiratory distress. She has no  wheezes. She has no rales. She exhibits no tenderness.  GI: Soft. She exhibits distension. She exhibits no mass. There is no tenderness. There is no rebound and no guarding.  Musculoskeletal: Normal range of motion. She exhibits no edema and no tenderness.  Lymphadenopathy:    She has no cervical adenopathy.  Neurological: She is alert and oriented to person, place, and time.  Skin: Skin is warm and dry. No rash noted. She is not diaphoretic. No erythema. No pallor.  Psychiatric: She has a normal mood and affect.    Assessment/Plan: 1. S/P parital lobe infarct 2. HTN, Hyperlipidemia 3. S/P bowel resection  Plan:  1.Medical management per CCM 2. NPO, NG tube to intermittent sxn. 3. Abdominal films in am.   Delesa Kawa 01/20/2012, 4:55 PM

## 2012-01-20 NOTE — Progress Notes (Signed)
ANTIBIOTIC CONSULT NOTE - INITIAL  Pharmacy Consult for adjustment of antbx for renal function  Indication: possible shock/renal failure  Allergies  Allergen Reactions  . Cephalexin Rash  . Penicillins Rash    Patient Measurements:   Vital Signs: Temp: 97.8 F (36.6 C) (07/15 1036) Temp src: Oral (07/15 1036) BP: 88/41 mmHg (07/15 1348) Pulse Rate: 94  (07/15 1348) Intake/Output from previous day:   Intake/Output from this shift:    Labs:  Basename 01/20/12 1101  WBC 10.6*  HGB 9.5*  PLT 303  LABCREA --  CREATININE 3.41*   Estimated Creatinine Clearance: 16.4 ml/min (by C-G formula based on Cr of 3.41). No results found for this basename: VANCOTROUGH:2,VANCOPEAK:2,VANCORANDOM:2,GENTTROUGH:2,GENTPEAK:2,GENTRANDOM:2,TOBRATROUGH:2,TOBRAPEAK:2,TOBRARND:2,AMIKACINPEAK:2,AMIKACINTROU:2,AMIKACIN:2, in the last 72 hours   Microbiology: No results found for this or any previous visit (from the past 720 hour(s)).  Medical History: Past Medical History  Diagnosis Date  . Hypertension   . H/O brain surgery   . Aneurysm of anterior cerebral artery     Medications:  Scheduled:    . sodium chloride   Intravenous Once  . DOPamine  2-20 mcg/kg/min Intravenous Once  . imipenem-cilastatin  500 mg Intravenous Q8H  . ondansetron (ZOFRAN) IV  4 mg Intravenous Once  . ondansetron (ZOFRAN) IV  4 mg Intravenous Once  . pantoprazole (PROTONIX) IV  40 mg Intravenous Q24H  . pantoprazole (PROTONIX) IV  40 mg Intravenous QHS  . promethazine  12.5 mg Intravenous To ER   Assessment: 66 yr old female presenting with shock,lactic acidosis an acute renal failure on primaxin.     Plan:  Change Primaxin to 250mg  IV q12hrs. F/u culture data, renal func  Marylouise Stacks 01/20/2012,1:49 PM

## 2012-01-20 NOTE — ED Notes (Signed)
Blood cultures drawn.

## 2012-01-20 NOTE — H&P (Signed)
Name: [name] [name] MRN: [id number] DOB: [birth year]    LOS: 0  Referring Provider:  Preston Fleeting, ED Reason for Referral:  shock, lactic acidosis & acute renal failure   PULMONARY / CRITICAL CARE MEDICINE  HPI:   65/F adm 7/15 with shock, lactic acidosis & acute renal failure on benazepril. She presented with vomiting x 4ds, husband found her in a couch after a fall, with garbled speech, she reports lightheadedness but no LOC.She hit her head once and hit her knee another time. Husband thought that she had a left facial droop and that her speech was slurred. This resolved by the time of ED arrival. Found to be hypotenisve, required dopamine after 3 L fluid. Labs showed lactate 10, BUN/ cr 40/3.4  PMH - reducible ventral hernia, aneurysm s/p coil 2006  Past Medical History  Diagnosis Date  . Hypertension   . H/O brain surgery   . Aneurysm of anterior cerebral artery    Past Surgical History  Procedure Date  . Bowel resection    Prior to Admission medications   Medication Sig Start Date End Date Taking? Authorizing Provider  ALPRAZolam (XANAX) 0.25 MG tablet Take 0.25 mg by mouth daily as needed. For anxiety   Yes Historical Provider, MD  benazepril (LOTENSIN) 20 MG tablet Take 20 mg by mouth daily.   Yes Historical Provider, MD  labetalol (NORMODYNE) 100 MG tablet Take 100 mg by mouth 2 (two) times daily.   Yes Historical Provider, MD  simvastatin (ZOCOR) 40 MG tablet Take 40 mg by mouth every evening.   Yes Historical Provider, MD   Allergies Allergies  Allergen Reactions  . Cephalexin Rash  . Penicillins Rash    Family History No family history on file. Social History  reports that she has never smoked. She does not have any smokeless tobacco history on file. She reports that she drinks alcohol. Her drug history not on file.  Review Of Systems:   Constitutional: negative for anorexia, fevers and sweats  Eyes: negative for irritation, redness and visual disturbance    Ears, nose, mouth, throat, and face: negative for earaches, epistaxis, nasal congestion and sore throat  Respiratory: negative for cough, dyspnea on exertion, sputum and wheezing  Cardiovascular: negative for chest pain, dyspnea, lower extremity edema, orthopnea, palpitations and syncope  Gastrointestinal: negative for abdominal pain, constipation, diarrhea, melena, nausea and vomiting  Genitourinary:negative for dysuria, frequency and hematuria  Hematologic/lymphatic: negative for bleeding, easy bruising and lymphadenopathy  Musculoskeletal:negative for arthralgias, muscle weakness and stiff joints  Neurological: negative for coordination problems, gait problems, headaches and weakness , POS for lightheadedness, fall Endocrine: negative for diabetic symptoms including polydipsia, polyuria and weight loss  Events Since Admission:   Current Status:  Vital Signs: Temp:  [97.8 F (36.6 C)] 97.8 F (36.6 C) (07/15 1036) Pulse Rate:  [77-88] 81  (07/15 1200) Resp:  [13-20] 19  (07/15 1200) BP: (59-83)/(30-40) 83/39 mmHg (07/15 1200) SpO2:  [96 %-100 %] 99 % (07/15 1200) Weight:  [177 lb 0.5 oz (80.3 kg)] 177 lb 0.5 oz (80.3 kg) (07/15 1127)  Physical Examination: General:  [id number] Neuro:  intact HEENT: no adenopathy Neck:  n jvd Cardiovascular:  hsr rrr Lungs:  cta Abdomen:  Distended, old incisional herina Musculoskeletal:  Intact  Skin:  Lt facial bruise, lt knee bruise and rt abrasions   Active Problems:  * No active hospital problems. *    ASSESSMENT AND PLAN  PULMONARY No results found for this basename: PHART:5,PCO2:5,PCO2ART:5,PO2ART:5,HCO3:5,O2SAT:5 in the last  168 hours   A:  No acute issue P:   monitor  CARDIOVASCULAR  Lab 01/20/12 1105 01/20/12 1101  TROPONINI -- <0.30  LATICACIDVEN 10.8* --  PROBNP -- --   ECG:  SINUS RHYTHM ~ normal P axis, V-rate 50- 99 EARLY PRECORDIAL R/S TRANSITION ~ QRS area positive in V2 BORDERLINE T ABNORMALITIES, INFERIOR  LEADS ~ T flat/neg, II III aVF PROLONGED QT INTERVAL ~ QTc >522mS  Lines: 7/15 Lt I J CVL>>  A: Shock from presumed dehydration with continued antihypertensive use in setting of 4 days of nausea. P:  Place cvl Pressors Admit to ICU Fluids Stop antihypertensives Rpt EKG in am for Qtc - avoid qt prolonging drugs  RENAL  Lab 01/20/12 1101  NA 131*  K 3.4*  CL 80*  CO2 18*  BUN 40*  CREATININE 3.41*  CALCIUM 10.2  MG --  PHOS --   Intake/Output    None    Foley:  7/15  A:  Acute renal failure P:   Place cvl Hydrate Stop all antihypertensives incl benazepril Renal adjust all meds  GASTROINTESTINAL  Lab 01/20/12 1101  AST 50*  ALT 21  ALKPHOS 79  BILITOT 2.3*  PROT 8.6*  ALBUMIN 3.6    A: Abd distension, lactic acidosis, hx of adhesion with blockage P:   -ct abd without IV contrast, doubt dead bowel, rpt lactate  HEMATOLOGIC  Lab 01/20/12 1101  HGB 9.5*  HCT 32.6*  PLT 303  INR --  APTT --   A: Anemia P:  -moniotor  INFECTIOUS  Lab 01/20/12 1101  WBC 10.6*  PROCALCITON --   Cultures: 7/15 pan culture Antibiotics: 7/15 primaxin with renal adjustment.   A:  Note elevated WBC P:   See flows  ENDOCRINE No results found for this basename: GLUCAP:5 in the last 168 hours A: Hypothyroidism P:   Check TSH Check cortisol level  NEUROLOGIC  A:  Dizziness most likely related to hypotension but has Hx of SAH with IR coiling 4 years ago P:   -CT head for completeness, non focal exam  BEST PRACTICE / DISPOSITION Level of Care:  ICU Primary Service: PCCM Consultants:  GI Code Status:  Full Diet:  NPO DVT Px: PAS GI Px:  PPI Skin Integrity:  Rt knee abrasion Social / Family:  Husband updated at bedside  [facility] Minor ACNP Adolph Pollack PCCM Pager [phone] till 3 pm If no answer page [phone] 01/20/2012, 12:55 PM   Independently examined pt, evaluated data & formulated above care plan with NP  Cyril Mourning MD. FCCP. [facility] Pager [phone] If no response call [phone]

## 2012-01-20 NOTE — ED Provider Notes (Signed)
History     CSN: [id number]  Arrival date & time 01/20/12  1026   First MD Initiated Contact with Patient 01/20/12 1028      Chief Complaint  Patient presents with  . Dizziness    (Consider location/radiation/quality/duration/timing/severity/associated sxs/prior treatment) The history is provided by the patient.   66 year old female has had nausea and vomiting for the last 4 days. She has a history of bowel resection and has hernia that intermittently causes problems with vomiting. Today, as she felt lightheaded and fell twice. She denies passing out. She hit her head once and hit her knee another time. Husband thought that she had a left facial droop and that her speech was slurred. Those have resolved. In triage, she was noted to be hypotensive. She denies chest pain, heaviness, tightness, pressure. Denies palpitations. She denies abdominal pain.  Past Medical History  Diagnosis Date  . Hypertension     No past surgical history on file.  No family history on file.  History  Substance Use Topics  . Smoking status: Not on file  . Smokeless tobacco: Not on file  . Alcohol Use:     OB History    Grav Para Term Preterm Abortions TAB SAB Ect Mult Living                  Review of Systems  All other systems reviewed and are negative.    Allergies  Cephalexin and Penicillins  Home Medications   Current Outpatient Rx  Name Route Sig Dispense Refill  . ALPRAZOLAM 0.25 MG PO TABS Oral Take 0.25 mg by mouth daily as needed. For anxiety      BP 62/40  Pulse 88  Temp 97.8 F (36.6 C) (Oral)  Resp 20  SpO2 96%  Physical Exam  Nursing note and vitals reviewed.  66 year old female is resting comfortably and is in no acute distress. Vital signs are significant for hypotension with blood pressure 62/40. Oxygen saturation is 96% which is normal. Head is normocephalic. Ecchymosis is seen on the left side of the forehead. PERRLA, EOMI. Neck is nontender and supple without  adenopathy or bruit. Lungs are clear without rales, wheezes, rhonchi. Heart has regular rate rhythm without murmur. Abdomen is soft, flat, nontender without hepatosplenomegaly. A ventral hernia is present which is reducible and nontender. Extremities have full range of motion, no cyanosis or edema. Minor ecchymosis is seen over the anterior aspect of the left knee. Skin is warm and dry without other rash. Neurologic: Mental status is normal, cranial nerves are intact, there are no motor or sensory deficits.  ED Course  Procedures (including critical care time)  Results for orders placed during the hospital encounter of 01/20/12  CBC WITH DIFFERENTIAL      Component Value Range   WBC 10.6 (*) 4.0 - 10.5 K/uL   RBC 4.01  3.87 - 5.11 MIL/uL   Hemoglobin 9.5 (*) 12.0 - 15.0 g/dL   HCT 29.5 (*) 28.4 - 13.2 %   MCV 81.3  78.0 - 100.0 fL   MCH 23.7 (*) 26.0 - 34.0 pg   MCHC 29.1 (*) 30.0 - 36.0 g/dL   RDW 44.0 (*) 10.2 - 72.5 %   Platelets 303  150 - 400 K/uL   Neutrophils Relative 81 (*) 43 - 77 %   Neutro Abs 8.6 (*) 1.7 - 7.7 K/uL   Lymphocytes Relative 8 (*) 12 - 46 %   Lymphs Abs 0.8  0.7 - 4.0 K/uL  Monocytes Relative 11  3 - 12 %   Monocytes Absolute 1.2 (*) 0.1 - 1.0 K/uL   Eosinophils Relative 0  0 - 5 %   Eosinophils Absolute 0.0  0.0 - 0.7 K/uL   Basophils Relative 0  0 - 1 %   Basophils Absolute 0.0  0.0 - 0.1 K/uL  COMPREHENSIVE METABOLIC PANEL      Component Value Range   Sodium 131 (*) 135 - 145 mEq/L   Potassium 3.4 (*) 3.5 - 5.1 mEq/L   Chloride 80 (*) 96 - 112 mEq/L   CO2 18 (*) 19 - 32 mEq/L   Glucose, Bld 155 (*) 70 - 99 mg/dL   BUN 40 (*) 6 - 23 mg/dL   Creatinine, Ser 1.61 (*) 0.50 - 1.10 mg/dL   Calcium 09.6  8.4 - 04.5 mg/dL   Total Protein 8.6 (*) 6.0 - 8.3 g/dL   Albumin 3.6  3.5 - 5.2 g/dL   AST 50 (*) 0 - 37 U/L   ALT 21  0 - 35 U/L   Alkaline Phosphatase 79  39 - 117 U/L   Total Bilirubin 2.3 (*) 0.3 - 1.2 mg/dL   GFR calc non Af Amer 13 (*) >90  mL/min   GFR calc Af Amer 15 (*) >90 mL/min  TROPONIN I      Component Value Range   Troponin I <0.30  <0.30 ng/mL  LACTIC ACID, PLASMA      Component Value Range   Lactic Acid, Venous 10.8 (*) 0.5 - 2.2 mmol/L   Dg Chest Portable 1 View  01/20/2012  *RADIOLOGY REPORT*  Clinical Data: Dizziness, hypotension  PORTABLE CHEST - 1 VIEW  Comparison: Portable exam 1052 hours repeated at 1058 hours compared to 12/09/2006  Findings: Rotated to left on both images. Upper normal heart size with mitral annular calcification. Atherosclerotic calcification aorta. Mediastinal contours and pulmonary vascularity normal. Left basilar atelectasis. Lungs otherwise clear. No pleural effusion or pneumothorax. Bones demineralized.  IMPRESSION: Left basilar atelectasis.  Original Report Authenticated By: Lollie Marrow, M.D.      Date: 01/20/2012  Rate: 89  Rhythm: normal sinus rhythm  QRS Axis: normal  Intervals: QT prolonged  ST/T Wave abnormalities: normal  Conduction Disutrbances:none  Narrative Interpretation: Prolonged QT interval. When compared with ECG of 01/21/2005, QT interval has lengthened.  Old EKG Reviewed: changes noted    1. Hypotension   2. Renal failure   3. Lactic acidosis   4. Anemia   5. Incisional hernia    CRITICAL CARE Performed by: WUJWJ,XBJYN   Total critical care time: 120 minutes  Critical care time was exclusive of separately billable procedures and treating other patients.  Critical care was necessary to treat or prevent imminent or life-threatening deterioration.  Critical care was time spent personally by me on the following activities: development of treatment plan with patient and/or surrogate as well as nursing, discussions with consultants, evaluation of patient's response to treatment, examination of patient, obtaining history from patient or surrogate, ordering and performing treatments and interventions, ordering and review of laboratory studies, ordering and  review of radiographic studies, pulse oximetry and re-evaluation of patient's condition.    MDM  Hypotension with nausea and vomiting. Hypertension is most likely secondary to dehydration from vomiting. Compensatory tachycardia is being blocked by beta-blockade from labetalol. There is no evidence of bowel ischemia since the hernia it was reducible and abdomen is nontender. She'll be given IV fluids to correct her blood pressure. She could conceivably  have had a TIA although I am not certain that her symptoms were not purely do to hypotension.  She is being given aggressive IV hydration and blood pressure has only come up to 75 systolic. This is after 2-1/2 L have been given. She is going to be given additional fluid and is started on dopamine drip. Blood work has come back showing anemia and renal failure. BUN/creatinine ratio is only slightly higher than 10 which would seem to indicate this is intrinsic renal disease rather than prerenal azotemia. Anemia would certainly be consistent with renal disease. Her medications include an ACE inhibitor which may be making her renal failure worse. Anion gap is markedly elevated at 32, some of which may be due 2 renal failure and some from lactic acidosis. Although lactate is significantly elevated and she has nausea, she has no abdominal pain or tenderness that would suggest ischemic bowel. She was also noted to have a reversed A/G ratio of which could indicate multiple myeloma which could cause anemia and renal failure. Case is discussed with critical care who will evaluate the patient and she will be admitted to intensive care. Blood pressure is currently in the mid 80s systolic.     Dione Booze, MD 01/20/12 1229

## 2012-01-20 NOTE — Procedures (Signed)
Central Venous Catheter Insertion Procedure Note [name] [name] [id number] 1945-08-29  Procedure: Insertion of Central Venous Catheter Indications: Drug and/or fluid administration  Procedure Details Consent: Risks of procedure as well as the alternatives and risks of each were explained to the (patient/caregiver).  Consent for procedure obtained. Time Out: Verified patient identification, verified procedure, site/side was marked, verified correct patient position, special equipment/implants available, medications/allergies/relevent history reviewed, required imaging and test results available.  Performed  Maximum sterile technique was used including antiseptics, cap, gloves, gown, hand hygiene, mask and sheet. Skin prep: Chlorhexidine; local anesthetic administered A antimicrobial bonded/coated triple lumen catheter was placed in the left internal jugular vein using the Seldinger technique. Ultrasound guidance used.yes Catheter placed to 20 cm. Blood aspirated via all 3 ports and then flushed x 3. Line sutured x 2 and dressing applied.  Evaluation Blood flow good Complications: No apparent complications Patient did tolerate procedure well. Chest X-ray ordered to verify placement.  CXR: pending.  [name] [name] [name] ACNP [facility] PCCM Pager [phone] till 3 pm If no answer page [phone] 01/20/2012, 1:37 PM

## 2012-01-20 NOTE — Progress Notes (Signed)
Name: [name] [name] MRN: [id number] DOB: [birth year]  ELECTRONIC ICU PHYSICIAN NOTE  Problem:  Persistent hypotension on levophed   Intake/Output Summary (Last 24 hours) at 01/20/12 1926 Last data filed at 01/20/12 1800  Gross per 24 hour  Intake 161.17 ml  Output   2600 ml  Net -2438.83 ml    CVP:  [1 mmHg] 1 mmHg   Therefore not keeping up with GI losses with > 2.5 liters of UGI output  Intervention:  NS x 2liters then recheck cvp  Sandrea Hughs 01/20/2012, 7:25 PM

## 2012-01-20 NOTE — ED Notes (Signed)
Pt states has had n/v x 4 days and today she fell ( has bump over left eye ) when husband found her she had slurred speech and  Left facial droop which has since resolved bu pt still has residual slurred speech. Pt is pale and cool to touch has good grips and neuro pt is aaox4 cbg  was 188

## 2012-01-21 ENCOUNTER — Inpatient Hospital Stay (HOSPITAL_COMMUNITY): Payer: BC Managed Care – PPO

## 2012-01-21 DIAGNOSIS — I959 Hypotension, unspecified: Secondary | ICD-10-CM

## 2012-01-21 DIAGNOSIS — K56609 Unspecified intestinal obstruction, unspecified as to partial versus complete obstruction: Secondary | ICD-10-CM | POA: Diagnosis present

## 2012-01-21 DIAGNOSIS — K432 Incisional hernia without obstruction or gangrene: Secondary | ICD-10-CM

## 2012-01-21 LAB — BASIC METABOLIC PANEL
BUN: 42 mg/dL — ABNORMAL HIGH (ref 6–23)
CO2: 19 mEq/L (ref 19–32)
Calcium: 7.7 mg/dL — ABNORMAL LOW (ref 8.4–10.5)
Creatinine, Ser: 3.51 mg/dL — ABNORMAL HIGH (ref 0.50–1.10)

## 2012-01-21 LAB — CBC
HCT: 25.8 % — ABNORMAL LOW (ref 36.0–46.0)
MCH: 23.7 pg — ABNORMAL LOW (ref 26.0–34.0)
MCV: 81.4 fL (ref 78.0–100.0)
Platelets: 201 10*3/uL (ref 150–400)
RBC: 3.17 MIL/uL — ABNORMAL LOW (ref 3.87–5.11)

## 2012-01-21 LAB — URINE CULTURE: Colony Count: NO GROWTH

## 2012-01-21 LAB — MAGNESIUM: Magnesium: 1.8 mg/dL (ref 1.5–2.5)

## 2012-01-21 LAB — CARDIAC PANEL(CRET KIN+CKTOT+MB+TROPI): Relative Index: 0.9 (ref 0.0–2.5)

## 2012-01-21 MED ORDER — CHLORHEXIDINE GLUCONATE 0.12 % MT SOLN: 15.0000 mL | Freq: Two times a day (BID) | OROMUCOSAL | Status: DC

## 2012-01-21 MED ORDER — INSULIN ASPART 100 UNIT/ML ~~LOC~~ SOLN: 0.0000 [IU] | SUBCUTANEOUS | Status: DC

## 2012-01-21 MED ORDER — BIOTENE DRY MOUTH MT LIQD
15.0000 mL | Freq: Two times a day (BID) | OROMUCOSAL | Status: DC
  Administered 2012-01-21: 15 mL via OROMUCOSAL

## 2012-01-21 MED ORDER — CHLORHEXIDINE GLUCONATE 0.12 % MT SOLN
15.0000 mL | Freq: Two times a day (BID) | OROMUCOSAL | Status: DC
  Administered 2012-01-21 – 2012-01-26 (×10): 15 mL via OROMUCOSAL
  Filled 2012-01-21 (×14): qty 15

## 2012-01-21 MED ORDER — BIOTENE DRY MOUTH MT LIQD
15.0000 mL | Freq: Two times a day (BID) | OROMUCOSAL | Status: DC
  Administered 2012-01-23 – 2012-01-24 (×4): 15 mL via OROMUCOSAL

## 2012-01-21 MED ORDER — SODIUM CHLORIDE 0.9 % IV BOLUS (SEPSIS)
1000.0000 mL | Freq: Once | INTRAVENOUS | Status: AC
  Administered 2012-01-21: 1000 mL via INTRAVENOUS

## 2012-01-21 NOTE — Progress Notes (Signed)
She feels better after large volume decompression of bilious material, abdomen nontender this am hernias present but nontender, no flatus still I think we can give her another 24 hours to see if this resolves, if not may need to go to or tomorrow

## 2012-01-21 NOTE — Progress Notes (Signed)
INITIAL ADULT NUTRITION ASSESSMENT Date: 01/21/2012   Time: 1:51 PM  Reason for Assessment: Nutrition Risk report (difficulty swallowing, vomiting)  INTERVENTION:  None at this time.    RD to monitor for ability to advance diet and follow-up to add supplements as needed.  No indication for TPN at this time; if remains NPO for 8-9 more days, may need to consider TPN.   DOCUMENTATION CODES Per approved criteria  -Obesity Unspecified     ASSESSMENT: Female 66 y.o.  Dx: SBO, likely secondary to adhesions; Shock, lactic acidosis & acute renal failure   Hx:  Past Medical History  Diagnosis Date  . Hypertension   . H/O brain surgery   . Aneurysm of anterior cerebral artery    Past Surgical History  Procedure Date  . Bowel resection     Related Meds:  Scheduled Meds:   . antiseptic oral rinse  15 mL Mouth Rinse q12n4p  . chlorhexidine  15 mL Mouth Rinse BID  . imipenem-cilastatin  250 mg Intravenous Q12H  . insulin aspart  0-15 Units Subcutaneous Q4H  . potassium chloride  10 mEq Intravenous Q1 Hr x 6  . sodium chloride  1,000 mL Intravenous Once  . sodium chloride  2,000 mL Intravenous Once  . DISCONTD: antiseptic oral rinse  15 mL Mouth Rinse q12n4p  . DISCONTD: chlorhexidine  15 mL Mouth Rinse BID  . DISCONTD: imipenem-cilastatin  500 mg Intravenous Q8H  . DISCONTD: pantoprazole (PROTONIX) IV  40 mg Intravenous Q24H  . DISCONTD: pantoprazole (PROTONIX) IV  40 mg Intravenous QHS  . DISCONTD: sodium chloride  2,000 mL Intravenous Once  . DISCONTD: sodium chloride  2,000 mL Intravenous Once   Continuous Infusions:   . sodium chloride 100 mL/hr at 01/21/12 1211  . DISCONTD: norepinephrine (LEVOPHED) Adult infusion Stopped (01/21/12 1037)   PRN Meds:.sodium chloride   Ht: 5\' 5"  (165.1 cm)  Wt: 214 lb 8.1 oz (97.3 kg)  Ideal Wt: 56.8 kg % Ideal Wt: 171%  Wt Readings from Last 12 Encounters:  01/21/12 214 lb 8.1 oz (97.3 kg)  05/28/10 177 lb (80.287 kg)    10/14/08 177 lb (80.287 kg)  02/06/07 152 lb (68.947 kg)    Usual Wt: 177 lb (1.5 years ago) % Usual Wt: 136%  Body mass index is 35.70 kg/(m^2).  Food/Nutrition Related Hx: Trouble swallowing, vomiting per admission nutrition screen; class 2 obesity with BMI=35.7.  Labs:  CMP     Component Value Date/Time   NA 134* 01/21/2012 0512   K 3.9 01/21/2012 0512   CL 101 01/21/2012 0512   CO2 19 01/21/2012 0512   GLUCOSE 159* 01/21/2012 0512   BUN 42* 01/21/2012 0512   CREATININE 3.51* 01/21/2012 0512   CALCIUM 7.7* 01/21/2012 0512   PROT 8.6* 01/20/2012 1101   ALBUMIN 3.6 01/20/2012 1101   AST 50* 01/20/2012 1101   ALT 21 01/20/2012 1101   ALKPHOS 79 01/20/2012 1101   BILITOT 2.3* 01/20/2012 1101   GFRNONAA 13* 01/21/2012 0512   GFRAA 15* 01/21/2012 0512    CBG (last 3)   Basename 01/20/12 1619  GLUCAP 148*    Sodium  Date/Time Value Range Status  01/21/2012  5:12 AM 134* 135 - 145 mEq/L Final  01/20/2012  3:03 PM 136  135 - 145 mEq/L Final   Magnesium  Date/Time Value Range Status  01/21/2012  5:12 AM 1.8  1.5 - 2.5 mg/dL Final   Potassium  Date/Time Value Range Status  01/21/2012  5:12 AM 3.9  3.5 - 5.1 mEq/L Final  01/20/2012  3:03 PM 3.2* 3.5 - 5.1 mEq/L Final  01/20/2012 11:01 AM 3.4* 3.5 - 5.1 mEq/L Final     Intake/Output Summary (Last 24 hours) at 01/21/12 1359 Last data filed at 01/21/12 1300  Gross per 24 hour  Intake 5071.07 ml  Output   5470 ml  Net -398.93 ml    Diet Order: NPO   IVF:    sodium chloride Last Rate: 100 mL/hr at 01/21/12 1211  DISCONTD: norepinephrine (LEVOPHED) Adult infusion Last Rate: Stopped (01/21/12 1037)    Estimated Nutritional Needs:   Kcal: 1700-1800 Protein: 85-100 grams Fluid: 1.8-2 liters  Patient remains NPO due to suspected SBO related to adhesions.  Patient well-nourished on admission.  Surgery is following, if symptoms do not resolve by tomorrow, may need surgery.  NUTRITION DIAGNOSIS: -Inadequate oral intake  (NI-2.1).  Status: Ongoing  RELATED TO: altered GI function  AS EVIDENCED BY: NPO status  MONITORING/EVALUATION(Goals):  Goal:  Intake to meet >90% of estimated nutrition needs.  Monitor:  Diet advancement, labs, weight trend.  EDUCATION NEEDS: -Education not appropriate at this time   Joaquin Courts, RD, CNSC, [place] Pager# [phone] After Hours Pager# [phone] 01/21/2012, 1:51 PM

## 2012-01-21 NOTE — Progress Notes (Signed)
Subjective: Resting comfortably this morning, no further c/o of N/V, NG remains in place.  Output recorded as (2600 ml initially and then another 1100 ml over past 24 hrs)   Objective: Vital signs in last 24 hours: Temp:  [97.8 F (36.6 C)-99.3 F (37.4 C)] 99.3 F (37.4 C) (07/16 0410) Pulse Rate:  [77-106] 91  (07/16 0715) Resp:  [13-26] 17  (07/16 0715) BP: (59-134)/(28-115) 120/59 mmHg (07/16 0715) SpO2:  [93 %-100 %] 97 % (07/16 0715) Weight:  [177 lb 0.5 oz (80.3 kg)-214 lb 8.1 oz (97.3 kg)] 214 lb 8.1 oz (97.3 kg) (07/16 0500) Last BM Date: 01/17/12  Intake/Output from previous day: 07/15 0701 - 07/16 0700 In: 3349.7 [I.V.:849.7; IV Piggyback:2500] Out: 4360 [Urine:660; Emesis/NG output:3700] Intake/Output this shift:    General appearance: alert, cooperative, appears stated age, no distress and moderately obese Abdomen: remains soft, there is a firm area that s tender to palpation in the umbilical area, no pulsation or erythema noted. Abdominal films have been done and report is pending. Chest: CTA Extremities: no edema  Lab Results:   Basename 01/21/12 0512 01/20/12 1101  WBC 8.4 10.6*  HGB 7.5* 9.5*  HCT 25.8* 32.6*  PLT 201 303   BMET  Basename 01/21/12 0512 01/20/12 1503  NA 134* 136  K 3.9 3.2*  CL 101 93*  CO2 19 23  GLUCOSE 159* 143*  BUN 42* 40*  CREATININE 3.51* 3.49*  CALCIUM 7.7* 8.2*   PT/INR No results found for this basename: LABPROT:2,INR:2 in the last 72 hours ABG No results found for this basename: PHART:2,PCO2:2,PO2:2,HCO3:2 in the last 72 hours  Studies/Results: Ct Abdomen Pelvis Wo Contrast  01/20/2012  *RADIOLOGY REPORT*  Clinical Data: Nausea and vomiting for 4 days, post fall, abdominal distension, reducible hernia, shock, lactic acidosis  CT ABDOMEN AND PELVIS WITHOUT CONTRAST  Technique:  Multidetector CT imaging of the abdomen and pelvis was performed following the standard protocol without intravenous contrast.  Comparison:  Chest radiograph - earlier same day; abdominal radiographic series - 12/09/2006  Findings:  The lack of intravenous contrast limits the ability to evaluate solid abdominal organs.  Normal hepatic contour. There is an ill defined approximate 1.6 x 2.1 cm hypoattenuating lesion with the dome of the left lobe of the liver (image 90, series 2) which is too small which is incompletely evaluated without intravenous contrast.  Normal noncontrast appearance of the gallbladder.  No ascites.  Normal noncontrast appearance of the bilateral kidneys.  No urinary obstruction.  Note is made of opacities measuring approximately 6 mm (image 62, series two) and 4 mm (image 51) adjacent to the expected location of the right ureter.  These findings are without associated upstream ureterectasis and favored to be within the adjacent right gonadal vein (gonadal veins phleboliths).  The urinary bladder is decompressed with a Foley catheter.  Normal noncontrast appearance of the bilateral adrenal glands and pancreas.  Scattered punctate calcifications with an otherwise normal-appearing spleen, favored to be the sequela of prior granulomas infection.  There are two adjacent anterior ventral wall abdominal hernias, one of which demonstrates a wide neck measuring approximately 8.7 cm in greatest transverse axial dimension (image 59) the other of which is narrow neck measuring approximately 3.5 cm (image 52).  Both of these hernias containing dilated loops of small bowel however do not definitely appear to be a transition point.  Of note, the narrow neck hernia does have a minimal amount of mesenteric stranding about its origin (image 52).  The  wide neck hernia does contain a short segment of nondilated transverse colon.  The small bowel is diffusely distended to the level of the cecum. The colon is largely decompressed to the level of the cecum (which is incidentally noted within the midline of the lower abdomen (image 59, series 2). Overall  findings are compatible small bowel obstruction, though exact transition point is not identified. Postsurgical change within a loop of small bowel within the anterior aspect of the right mid hemiabdomen (images 37 through 44).  There are multiple scattered foci of air within a dilated loop of small bowel within the right mid hemiabdomen.  No pneumoperitoneum or portal venous gas.  Scattered atherosclerotic calcifications within a mildly tortuous but normal caliber abdominal aorta.  There is a cluster of dilated presumable venous collaterals within the lower abdomen (image 57, series 2) of uncertain etiology or clinical significance.  Scattered colonic diverticulosis without evidence of diverticulitis.  Limited visualization of the lower thorax demonstrates small bilateral pleural effusions, left greater than right. Calcified granuloma within the left lower lobe (image four), the sequela of prior granulomatous infection.  Minimal bibasilar dependent opacities favored to represent atelectasis.  Normal heart size. Small amount of pericardial fluid, presumably physiologic. Extensive calcifications within the mitral valve annulus.  No acute or aggressive osseous abnormalities.  Moderate multilevel lumbar spine degenerative change, worst at L3 - L4, L4 - L5 and L5 - S1.  Bilateral moderate to severe facet degenerative change at L5 - S1.  IMPRESSION:  1. Dilated loops of small bowel concerning for small bowel obstruction, though an exact transition point is not identified presumably secondary to either an adhesion or adjacent ventral wall abdominal hernias. 2.  Scattered foci of nondependent air within dilated loopd of small bowel within the right mid hemiabdomen, while presumably intraluminal, a small amount of pneumatosis is not excluded.  No definite portal venous gas or pneumoperitoneum.  3. Indeterminate 2.1 cm hypoattenuating lesion within the dome of the left lobe the liver, a fully evaluated without intravenous  contrast.  Comparison to prior examinations (if available) is recommended.  If no comparisons exist, further evaluation with non emergent contrast enhanced CT or MRI may be performed as clinically indicated.  4.  Likely right-sided gonadal vein phleboliths without evidence of urinary obstruction.  5.  Dilated presumably venous collaterals within the inferior aspect of the abdomen of uncertain etiology or clinical significance.  6.  Lumbar spine degenerative change.  Above findings discussed with Dr. Jamesetta So at [phone].  Original Report Authenticated By: Waynard Reeds, M.D.   Ct Head Wo Contrast  01/20/2012  *RADIOLOGY REPORT*  Clinical Data: Fall.  Slurred speech.  Left facial droop.  CT HEAD WITHOUT CONTRAST  Technique:  Contiguous axial images were obtained from the base of the skull through the vertex without contrast.  Comparison: 01/20/2005.  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 Taking this limitation into account, no evidence of intracranial hemorrhage or CT evidence of large acute infarct.  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  Mild global atrophy without hydrocephalus.  No skull fracture.  Vascular calcifications.  Orbital structures appear to be grossly intact.  Visualized sinuses and mastoid air cells are clear.  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  Taking this limitation into account, no evidence of intracranial hemorrhage or CT evidence of large acute infarct.  Remote posterior left temporal -  parietal lobe infarct.  Original Report Authenticated By: Fuller Canada, M.D.   Dg Chest Port 1 View  01/20/2012  *RADIOLOGY REPORT*  Clinical Data: Central line placement, history hypertension  PORTABLE CHEST - 1 VIEW  Comparison: Portable exam 1347 hours compared to 01/20/2012  Findings: Left jugular line,  tip projecting over SVC. Upper normal heart size. Mitral annular calcification noted. Mediastinal contours and pulmonary vascularity normal. Question mild atelectasis at left base. Lungs otherwise clear. No pneumothorax. Bones appear demineralized.  IMPRESSION: No pneumothorax following left jugular line placement. Left basilar atelectasis.  Original Report Authenticated By: Lollie Marrow, M.D.   Dg Chest Portable 1 View  01/20/2012  *RADIOLOGY REPORT*  Clinical Data: Dizziness, hypotension  PORTABLE CHEST - 1 VIEW  Comparison: Portable exam 1052 hours repeated at 1058 hours compared to 12/09/2006  Findings: Rotated to left on both images. Upper normal heart size with mitral annular calcification. Atherosclerotic calcification aorta. Mediastinal contours and pulmonary vascularity normal. Left basilar atelectasis. Lungs otherwise clear. No pleural effusion or pneumothorax. Bones demineralized.  IMPRESSION: Left basilar atelectasis.  Original Report Authenticated By: Lollie Marrow, M.D.   Dg Abd Portable 1v  01/20/2012  *RADIOLOGY REPORT*  Clinical Data: A NG tube placement.  PORTABLE ABDOMEN - 1 VIEW  Comparison: CT of abdomen and pelvis 01/20/2012.  Findings: Tip of nasogastric tube is in the stomach, and the side port is within the proximal stomach.  Multiple dilated loops of gas- filled small bowel are noted, measuring up to 5.4 cm in diameter. The abdomen is incompletely visualized on this single-view examination.  No gross evidence of pneumoperitoneum is identified at this time.  IMPRESSION: 1.  Feeding tube is located within the stomach. 2.  Findings again consistent with small bowel obstruction.  Original Report Authenticated By: Florencia Reasons, M.D.    Anti-infectives: Anti-infectives     Start     Dose/Rate Route Frequency Ordered Stop   01/20/12 1500   imipenem-cilastatin (PRIMAXIN) 250 mg in sodium chloride 0.9 % 100 mL IVPB        250 mg 200 mL/hr over 30 Minutes Intravenous Every 12  hours 01/20/12 1400     01/20/12 1400   imipenem-cilastatin (PRIMAXIN) 500 mg in sodium chloride 0.9 % 100 mL IVPB  Status:  Discontinued        500 mg 200 mL/hr over 30 Minutes Intravenous 3 times per day 01/20/12 1331 01/20/12 1358          Assessment/Plan: s/p * No surgery found * 1. SBO  2. Acute renal failure 3. S/P parital lobe infarct 4. HTN, Hyperlipidemia  5. S/P bowel resection  Plan:  1. Continue with NPO, NG to Low intermittent sxn 2. Await abdominal film results 3. Medical management per CCM for her other medical issues.   LOS: 1 day    Piccola A[place] 01/21/2012

## 2012-01-21 NOTE — Progress Notes (Signed)
Name: [name] [name] MRN: [id number] DOB: [birth year]    LOS: 1  Referring Provider:  Preston Fleeting, ED Reason for Referral:  Shock, lactic acidosis & acute renal failure   PULMONARY / CRITICAL CARE MEDICINE  Brief patient description: 65/F admitted 7/15 with shock, lactic acidosis & acute renal failure on benazepril. She presented with vomiting x 4ds, husband found her in a couch after a fall, with garbled speech, she reports lightheadedness but no LOC.She hit her head once and hit her knee another time. Husband thought that she had a left facial droop and that her speech was slurred. This resolved by the time of ED arrival. Found to be hypotenisve, required dopamine after 3 L fluid. Labs showed lactate 10, BUN/ cr 40/3.4  Events Since Admission: 7/15  Admitted for shock, lactic acidosis and acute renal failure  Current Status:  Reports feeling much better.  Now is off pressors.  Vital Signs: Temp:  [97.8 F (36.6 C)-99.3 F (37.4 C)] 99.1 F (37.3 C) (07/16 1124) Pulse Rate:  [87-106] 93  (07/16 1200) Resp:  [16-26] 25  (07/16 1200) BP: (65-134)/(38-115) 101/56 mmHg (07/16 1200) SpO2:  [95 %-100 %] 100 % (07/16 1200) Weight:  [91.5 kg (201 lb 11.5 oz)-97.3 kg (214 lb 8.1 oz)] 97.3 kg (214 lb 8.1 oz) (07/16 0500)  Physical Examination: General:  No acute distress Neuro:  Awake, alert, cooperative HEENT: PERRL Neck:  No JVD Cardiovascular:  RRR, no murmurs Lungs:  CTAB Abdomen:  Distended, old incisional hernia, mildly tender, no rebound Musculoskeletal:  Intact  Skin:  Lt facial bruise, lt knee bruise and rt abrasions   Active Problems:  HYPERLIPIDEMIA  HYPERTENSION  Septic shock  Acute renal failure  Small bowel obstruction  ASSESSMENT AND PLAN  PULMONARY No results found for this basename: PHART:5,PCO2:5,PCO2ART:5,PO2ART:5,HCO3:5,O2SAT:5 in the last 168 hours  A:  No acute issue P:   Supplemental oxygen, keep SpO2>92%  CARDIOVASCULAR  Lab 01/21/12 0512 01/20/12  2126 01/20/12 1504 01/20/12 1352 01/20/12 1105 01/20/12 1101  TROPONINI <0.30 <0.30 -- <0.30 -- <0.30  LATICACIDVEN -- -- 2.1 -- 10.8* --  PROBNP -- -- -- -- -- --   ECG:  7/15 >>> NSR, no ST-T changes Lines: 7/15 Lt I J CVL>>  A: Shock from presumed dehydration with continued antihypertensive use in setting of large volume emesis.  Resolved.  Now normotensive, off pressors.  History of hyperlipidemia / hypertension. P:  Telemetry Hold preadmission Rx  RENAL  Lab 01/21/12 0512 01/20/12 1503 01/20/12 1101  NA 134* 136 131*  K 3.9 3.2* --  CL 101 93* 80*  CO2 19 23 18*  BUN 42* 40* 40*  CREATININE 3.51* 3.49* 3.41*  CALCIUM 7.7* 8.2* 10.2  MG 1.8 -- --  PHOS -- -- --   Intake/Output      07/15 0701 - 07/16 0700 07/16 0701 - 07/17 0700   I.V. (mL/kg) 994.7 (10.2) 376.4 (3.9)   IV Piggyback 2500    Total Intake(mL/kg) 3494.7 (35.9) 376.4 (3.9)   Urine (mL/kg/hr) 660 (0.3) 585 (1)   Emesis/NG output 3750 475   Total Output 4410 1060   Net -915.3 -683.6         Foley:  7/15  A:  Acute renal failure without hyperkalemia, likely AKI. P:   IVF Trend BMP  GASTROINTESTINAL  Lab 01/20/12 1101  AST 50*  ALT 21  ALKPHOS 79  BILITOT 2.3*  PROT 8.6*  ALBUMIN 3.6   CT abdomen:  7/15 >>> Dilated loops of  small bowel concerning for small bowel obstruction, though an exact transition point is not identified presumably secondary to either an adhesion or adjacent ventral wall abdominal hernias. Scattered foci of nondependent air within dilated loopd of small bowel within the right mid hemiabdomen, while presumably intraluminal, a small amount of pneumatosis is not excluded. No definite portal venous gas or pneumoperitoneum. Indeterminate 2.1 cm hypoattenuating lesion within the dome of the left lobe the liver, a fully evaluated without intravenous contrast. Comparison to prior examinations (if available) is recommended. If no comparisons exist, further evaluation with non emergent  contrast enhanced CT or MRI may be performed as clinically indicated. Likely right-sided gonadal vein phleboliths without evidence of urinary obstruction. Dilated presumably venous collaterals within the inferior aspect of the abdomen of uncertain etiology or clinical significance. Lumbar spine degenerative change.  A: SBO, likely secondary to adhesions.  Hernias.  P:   NPO NGT to Sx Surgery following Possible OR tomorrow  HEMATOLOGIC  Lab 01/21/12 0512 01/20/12 1101  HGB 7.5* 9.5*  HCT 25.8* 32.6*  PLT 201 303  INR -- --  APTT -- --   A: Anemia P:  Trend CBC  INFECTIOUS  Lab 01/21/12 0512 01/20/12 1101  WBC 8.4 10.6*  PROCALCITON -- --   Cultures: 7/15  Blood >>> 7/15  Urine >>>  Antibiotics: 7/15 Primaxin >>>  A:  Possible peritonitis. P:   Antibiotics / cultures as above  ENDOCRINE  Lab 01/20/12 1619  GLUCAP 148*   A: Hyperglycemia. 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 Taking this limitation into account, no evidence of intracranial hemorrhage or CT evidence of large acute infarct. 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  A:  Dizziness most likely related to hypotension but has Hx of SAH with IR coiling 4 years ago, resolved.  Acute encephalopathy resolved. P:   Monitor  BEST PRACTICE / DISPOSITION Level of Care:  ICU Primary Service: PCCM Consultants:  Surgery Code Status:  Full Diet:  NPO DVT Px: SCDs GI Px: Not indicated Skin Integrity:  Rt knee abrasion Social / Family:  Family not available for rounds  Orlean Bradford, M.D., F.C.C.P. Pulmonary and Critical Care Medicine [facility] Cell: [phone] Pager: [phone]

## 2012-01-22 ENCOUNTER — Encounter (HOSPITAL_COMMUNITY): Payer: Self-pay | Admitting: Anesthesiology

## 2012-01-22 ENCOUNTER — Inpatient Hospital Stay (HOSPITAL_COMMUNITY): Payer: BC Managed Care – PPO

## 2012-01-22 ENCOUNTER — Inpatient Hospital Stay (HOSPITAL_COMMUNITY): Payer: BC Managed Care – PPO | Admitting: Anesthesiology

## 2012-01-22 ENCOUNTER — Encounter (HOSPITAL_COMMUNITY)
Admission: EM | Disposition: A | Discharge status: Home / Self Care | Payer: Self-pay | Source: Home / Self Care | Attending: Internal Medicine

## 2012-01-22 DIAGNOSIS — K565 Intestinal adhesions [bands], unspecified as to partial versus complete obstruction: Secondary | ICD-10-CM

## 2012-01-22 DIAGNOSIS — K430 Incisional hernia with obstruction, without gangrene: Secondary | ICD-10-CM

## 2012-01-22 HISTORY — PX: LAPAROTOMY: SHX154

## 2012-01-22 LAB — BASIC METABOLIC PANEL
CO2: 22 mEq/L (ref 19–32)
Chloride: 107 mEq/L (ref 96–112)
GFR calc non Af Amer: 35 mL/min — ABNORMAL LOW (ref >90)
Glucose, Bld: 93 mg/dL (ref 70–99)
Potassium: 3.4 mEq/L — ABNORMAL LOW (ref 3.5–5.1)
Sodium: 140 mEq/L (ref 135–145)

## 2012-01-22 LAB — CBC
Hemoglobin: 7 g/dL — ABNORMAL LOW (ref 12.0–15.0)
RBC: 3.01 MIL/uL — ABNORMAL LOW (ref 3.87–5.11)
WBC: 6.8 10*3/uL (ref 4.0–10.5)

## 2012-01-22 LAB — GLUCOSE, CAPILLARY: Glucose-Capillary: 119 mg/dL — ABNORMAL HIGH (ref 70–99)

## 2012-01-22 SURGERY — LAPAROTOMY, EXPLORATORY
Anesthesia: General | Site: Abdomen | Wound class: Clean

## 2012-01-22 MED ORDER — BACITRACIN ZINC 500 UNIT/GM EX OINT
TOPICAL_OINTMENT | CUTANEOUS | Status: AC
  Filled 2012-01-22: qty 15

## 2012-01-22 MED ORDER — LACTATED RINGERS IV SOLN
INTRAVENOUS | Status: DC | PRN
  Administered 2012-01-22 (×2): via INTRAVENOUS

## 2012-01-22 MED ORDER — SUCCINYLCHOLINE CHLORIDE 20 MG/ML IJ SOLN
INTRAMUSCULAR | Status: DC | PRN
  Administered 2012-01-22: 100 mg via INTRAVENOUS

## 2012-01-22 MED ORDER — LACTATED RINGERS IV SOLN
INTRAVENOUS | Status: DC
  Administered 2012-01-22: 11:00:00 via INTRAVENOUS

## 2012-01-22 MED ORDER — ONDANSETRON HCL 4 MG/2ML IJ SOLN: 4.0000 mg | Freq: Four times a day (QID) | INTRAMUSCULAR | Status: DC | PRN

## 2012-01-22 MED ORDER — ROCURONIUM BROMIDE 100 MG/10ML IV SOLN
INTRAVENOUS | Status: DC | PRN
  Administered 2012-01-22: 10 mg via INTRAVENOUS
  Administered 2012-01-22: 30 mg via INTRAVENOUS

## 2012-01-22 MED ORDER — MIDAZOLAM HCL 5 MG/5ML IJ SOLN
INTRAMUSCULAR | Status: DC | PRN
  Administered 2012-01-22: 2 mg via INTRAVENOUS

## 2012-01-22 MED ORDER — MORPHINE SULFATE (PF) 1 MG/ML IV SOLN
INTRAVENOUS | Status: AC
  Filled 2012-01-22: qty 25

## 2012-01-22 MED ORDER — SODIUM CHLORIDE 0.9 % IJ SOLN: 9.0000 mL | INTRAMUSCULAR | Status: DC | PRN

## 2012-01-22 MED ORDER — ONDANSETRON HCL 4 MG/2ML IJ SOLN
INTRAMUSCULAR | Status: DC | PRN
  Administered 2012-01-22: 4 mg via INTRAVENOUS

## 2012-01-22 MED ORDER — 0.9 % SODIUM CHLORIDE (POUR BTL) OPTIME
TOPICAL | Status: DC | PRN
  Administered 2012-01-22 (×2): 1000 mL
  Administered 2012-01-22: 2000 mL

## 2012-01-22 MED ORDER — POTASSIUM CHLORIDE 10 MEQ/50ML IV SOLN
10.0000 meq | INTRAVENOUS | Status: AC
  Administered 2012-01-22 (×3): 10 meq via INTRAVENOUS
  Filled 2012-01-22 (×3): qty 50

## 2012-01-22 MED ORDER — ONDANSETRON HCL 4 MG/2ML IJ SOLN: 4.0000 mg | Freq: Once | INTRAMUSCULAR | Status: AC | PRN

## 2012-01-22 MED ORDER — HYDROMORPHONE HCL PF 1 MG/ML IJ SOLN
0.2500 mg | INTRAMUSCULAR | Status: DC | PRN
  Administered 2012-01-22 (×4): 0.5 mg via INTRAVENOUS

## 2012-01-22 MED ORDER — PHENYLEPHRINE HCL 10 MG/ML IJ SOLN
INTRAMUSCULAR | Status: DC | PRN
  Administered 2012-01-22 (×2): 80 ug via INTRAVENOUS

## 2012-01-22 MED ORDER — DIPHENHYDRAMINE HCL 12.5 MG/5ML PO ELIX
12.5000 mg | ORAL_SOLUTION | Freq: Four times a day (QID) | ORAL | Status: DC | PRN
  Filled 2012-01-22: qty 5

## 2012-01-22 MED ORDER — GLYCOPYRROLATE 0.2 MG/ML IJ SOLN
INTRAMUSCULAR | Status: DC | PRN
  Administered 2012-01-22: .7 mg via INTRAVENOUS

## 2012-01-22 MED ORDER — HYDROMORPHONE HCL PF 1 MG/ML IJ SOLN
INTRAMUSCULAR | Status: AC
  Filled 2012-01-22: qty 1

## 2012-01-22 MED ORDER — MORPHINE SULFATE (PF) 1 MG/ML IV SOLN
INTRAVENOUS | Status: DC
  Administered 2012-01-22: 3 mg via INTRAVENOUS
  Administered 2012-01-22: 18:00:00 via INTRAVENOUS
  Administered 2012-01-23: 25 mL via INTRAVENOUS
  Administered 2012-01-23: 22:00:00 via INTRAVENOUS
  Administered 2012-01-23: 10.5 mg via INTRAVENOUS
  Administered 2012-01-24: 8.3 mg via INTRAVENOUS
  Administered 2012-01-24: 4.5 mg via INTRAVENOUS
  Administered 2012-01-24: 4.9 mg via INTRAVENOUS
  Administered 2012-01-24: 1.5 mg via INTRAVENOUS
  Administered 2012-01-24: 9 mg via INTRAVENOUS
  Administered 2012-01-24: 4.5 mg via INTRAVENOUS
  Administered 2012-01-24: 2 mg via INTRAVENOUS
  Administered 2012-01-25: 1.5 mL via INTRAVENOUS
  Filled 2012-01-22 (×3): qty 25

## 2012-01-22 MED ORDER — NEOSTIGMINE METHYLSULFATE 1 MG/ML IJ SOLN
INTRAMUSCULAR | Status: DC | PRN
  Administered 2012-01-22: 4 mg via INTRAVENOUS

## 2012-01-22 MED ORDER — DIPHENHYDRAMINE HCL 50 MG/ML IJ SOLN: 12.5000 mg | Freq: Four times a day (QID) | INTRAMUSCULAR | Status: DC | PRN

## 2012-01-22 MED ORDER — LIDOCAINE HCL (CARDIAC) 20 MG/ML IV SOLN
INTRAVENOUS | Status: DC | PRN
  Administered 2012-01-22: 100 mg via INTRAVENOUS

## 2012-01-22 MED ORDER — PROPOFOL 10 MG/ML IV EMUL
INTRAVENOUS | Status: DC | PRN
  Administered 2012-01-22: 150 mg via INTRAVENOUS

## 2012-01-22 MED ORDER — FENTANYL CITRATE 0.05 MG/ML IJ SOLN
INTRAMUSCULAR | Status: DC | PRN
  Administered 2012-01-22 (×3): 50 ug via INTRAVENOUS
  Administered 2012-01-22: 100 ug via INTRAVENOUS

## 2012-01-22 MED ORDER — PANTOPRAZOLE SODIUM 40 MG IV SOLR
40.0000 mg | Freq: Every day | INTRAVENOUS | Status: DC
  Administered 2012-01-22 – 2012-01-26 (×5): 40 mg via INTRAVENOUS
  Filled 2012-01-22 (×7): qty 40

## 2012-01-22 MED ORDER — ALBUMIN HUMAN 5 % IV SOLN
INTRAVENOUS | Status: DC | PRN
  Administered 2012-01-22 (×2): via INTRAVENOUS

## 2012-01-22 MED ORDER — NALOXONE HCL 0.4 MG/ML IJ SOLN: 0.4000 mg | INTRAMUSCULAR | Status: DC | PRN

## 2012-01-22 MED ORDER — SODIUM CHLORIDE 0.9 % IV SOLN
500.0000 mg | Freq: Three times a day (TID) | INTRAVENOUS | Status: DC
  Administered 2012-01-22 – 2012-01-24 (×4): 500 mg via INTRAVENOUS
  Filled 2012-01-22 (×8): qty 500

## 2012-01-22 SURGICAL SUPPLY — 52 items
BLADE SURG ROTATE 9660 (MISCELLANEOUS) IMPLANT
CANISTER SUCTION 2500CC (MISCELLANEOUS) ×3 IMPLANT
CHLORAPREP W/TINT 26ML (MISCELLANEOUS) ×2 IMPLANT
CLOTH BEACON ORANGE TIMEOUT ST (SAFETY) ×2 IMPLANT
COVER SURGICAL LIGHT HANDLE (MISCELLANEOUS) ×2 IMPLANT
DRAIN CHANNEL 19F RND (DRAIN) ×2 IMPLANT
DRAPE LAPAROSCOPIC ABDOMINAL (DRAPES) ×2 IMPLANT
DRAPE WARM FLUID 44X44 (DRAPE) ×2 IMPLANT
DRSG PAD ABDOMINAL 8X10 ST (GAUZE/BANDAGES/DRESSINGS) ×1 IMPLANT
ELECT BLADE 6.5 EXT (BLADE) IMPLANT
ELECT REM PT RETURN 9FT ADLT (ELECTROSURGICAL) ×2
ELECTRODE REM PT RTRN 9FT ADLT (ELECTROSURGICAL) ×1 IMPLANT
EVACUATOR SILICONE 100CC (DRAIN) ×2 IMPLANT
GLOVE BIO SURGEON STRL SZ7 (GLOVE) ×3 IMPLANT
GLOVE BIO SURGEON STRL SZ7.5 (GLOVE) ×1 IMPLANT
GLOVE BIOGEL PI IND STRL 6.5 (GLOVE) IMPLANT
GLOVE BIOGEL PI IND STRL 7.0 (GLOVE) IMPLANT
GLOVE BIOGEL PI IND STRL 7.5 (GLOVE) ×1 IMPLANT
GLOVE BIOGEL PI INDICATOR 6.5 (GLOVE) ×2
GLOVE BIOGEL PI INDICATOR 7.0 (GLOVE) ×2
GLOVE BIOGEL PI INDICATOR 7.5 (GLOVE) ×2
GLOVE ECLIPSE 6.5 STRL STRAW (GLOVE) ×3 IMPLANT
GLOVE SURG SS PI 7.0 STRL IVOR (GLOVE) ×3 IMPLANT
GOWN STRL NON-REIN LRG LVL3 (GOWN DISPOSABLE) ×8 IMPLANT
KIT BASIN OR (CUSTOM PROCEDURE TRAY) ×2 IMPLANT
KIT ROOM TURNOVER OR (KITS) ×2 IMPLANT
LIGASURE IMPACT 36 18CM CVD LR (INSTRUMENTS) IMPLANT
NS IRRIG 1000ML POUR BTL (IV SOLUTION) ×6 IMPLANT
PACK GENERAL/GYN (CUSTOM PROCEDURE TRAY) ×2 IMPLANT
PAD ARMBOARD 7.5X6 YLW CONV (MISCELLANEOUS) ×2 IMPLANT
SPECIMEN JAR LARGE (MISCELLANEOUS) IMPLANT
SPONGE GAUZE 4X4 12PLY (GAUZE/BANDAGES/DRESSINGS) ×2 IMPLANT
SPONGE LAP 18X18 X RAY DECT (DISPOSABLE) IMPLANT
STAPLER VISISTAT 35W (STAPLE) ×2 IMPLANT
SUCTION POOLE TIP (SUCTIONS) ×2 IMPLANT
SUT ETHILON 2 0 FS 18 (SUTURE) ×2 IMPLANT
SUT NOVA 1 T20/GS 25DT (SUTURE) ×4 IMPLANT
SUT PDS AB 1 TP1 96 (SUTURE) ×4 IMPLANT
SUT SILK 2 0 (SUTURE) ×2
SUT SILK 2 0 SH CR/8 (SUTURE) ×2 IMPLANT
SUT SILK 2-0 18XBRD TIE 12 (SUTURE) ×1 IMPLANT
SUT SILK 3 0 (SUTURE) ×2
SUT SILK 3 0 SH CR/8 (SUTURE) ×2 IMPLANT
SUT SILK 3-0 18XBRD TIE 12 (SUTURE) ×1 IMPLANT
SUT VIC AB 3-0 SH 27 (SUTURE)
SUT VIC AB 3-0 SH 27X BRD (SUTURE) IMPLANT
TAPE CLOTH SURG 6X10 WHT LF (GAUZE/BANDAGES/DRESSINGS) ×1 IMPLANT
TOWEL OR 17X24 6PK STRL BLUE (TOWEL DISPOSABLE) ×2 IMPLANT
TOWEL OR 17X26 10 PK STRL BLUE (TOWEL DISPOSABLE) ×2 IMPLANT
TRAY FOLEY CATH 14FRSI W/METER (CATHETERS) ×1 IMPLANT
WATER STERILE IRR 1000ML POUR (IV SOLUTION) ×1 IMPLANT
YANKAUER SUCT BULB TIP NO VENT (SUCTIONS) ×1 IMPLANT

## 2012-01-22 NOTE — Op Note (Addendum)
Preoperative diagnosis: #1 small bowel obstruction #2 incisional hernia Postoperative diagnosis: Incarcerated incisional hernia with small bowel obstruction Procedure: #1 exploratory laparotomy with lysis of adhesions x1 hour #2primary ventral hernia repair with suture Surgeon: Dr. Harden Mo Assistant: Ms. Magnus Ivan Anesthesia: Gen. Estimated blood loss: 50 cc Specimens: None Complications: None Sponge and needle count was correct x2 at end of operation Disposition to ICU in stable condition  Indications: This is a 66 year old female presented 48 hours ago with nausea and vomiting as well as profound dehydration and renal failure. Her CT scans show what appeared to be an adhesive bowel obstruction. She also has 2 hernias that were apparent on her exam as well as her CT scan. She is improved from the last 48 hours she and I discussed today due to the fact if she is no improved with her bowel obstruction and I'm concerned these may involve her hernias. I recommended going to the operating room for laparotomy today. We discussed risk and benefits of surgery prior to beginning.  Procedure: After informed consent was obtained the patient was taken to the operative. She has sequential compression devices placed. She was then placed under general endotracheal anesthesia without complication. She had a Foley catheter and a nasogastric tube already in place. Her abdomen was then prepped and draped in the standard sterile surgical fashion. Surgical timeout was performed.  She had a prior transverse incision around her umbilicus. I made a midline incision. I then carried this out to her fascia taking care to avoid the hernias I entered into a peritoneum superiorly where her hernias were not. I then proceeded to dissect down and identify what was a fairly large Swiss cheese defect around her umbilicus and her prior incision. There was both small bowel and colon incarcerated in this. I think she  actually had a colonic obstruction in her right colon that was incarcerated including the terminal ileum. This was reduced with some difficulty. I spent about an hour lysing adhesions. Once I had done this I then inspected her entire bowel from the ligament of Treitz all the way through her transverse colon. This was all intact and viable. She had a couple small areas of bleeding that I oversewed with 3-0 silk sutures. The bowel had a couple of dusky spots on it but was viable. There was also fluid in her abdomen when I opened. I was concerned that due to her current state as well as the operative findings even though that I did not enter the bowel that I did not want to place a piece of mesh. She understands that she is likely have another hernia but I felt that it safer thing to do would be to posterior primarily and not place a piece of mesh due to the risk of infection. I then irrigated copiously. I ran her bowel a couple of times and there were no evidence of any injury. I then placed the omentum overlying the bowel. I then made flaps on both sides overlying the external oblique. I then close her fascia primarily without any tension with #1 Novafil in an interrupted fashion. I then obtained hemostasis. More irrigation was performed. I placed 2 19 [place] Blake drains and secured these with 2-0 nylon sutures. I then approximated her subcutaneous tissue with 3-0 Vicryl sutures and closed with staples. Bacitracin was placed over the wounds. I then placed bandages and an abdominal binder. She was extubated and transferred to recovery.

## 2012-01-22 NOTE — Anesthesia Preprocedure Evaluation (Signed)
Anesthesia Evaluation  Patient identified by MRN, date of birth, ID band Patient awake    Reviewed: Allergy & Precautions, H&P , NPO status , Patient's Chart, lab work & pertinent test results  Airway Mallampati: I TM Distance: >3 FB Neck ROM: full    Dental   Pulmonary          Cardiovascular hypertension, Rhythm:regular Rate:Normal     Neuro/Psych    GI/Hepatic   Endo/Other    Renal/GU      Musculoskeletal   Abdominal   Peds  Hematology   Anesthesia Other Findings Bowel obstruction  Reproductive/Obstetrics                           Anesthesia Physical Anesthesia Plan  ASA: III  Anesthesia Plan: General   Post-op Pain Management:    Induction: Intravenous  Airway Management Planned: Oral ETT  Additional Equipment:   Intra-op Plan:   Post-operative Plan: Possible Post-op intubation/ventilation  Informed Consent: I have reviewed the patients History and Physical, chart, labs and discussed the procedure including the risks, benefits and alternatives for the proposed anesthesia with the patient or authorized representative who has indicated his/her understanding and acceptance.     Plan Discussed with: CRNA, Anesthesiologist and Surgeon  Anesthesia Plan Comments:         Anesthesia Quick Evaluation

## 2012-01-22 NOTE — Progress Notes (Signed)
Name: [name] [name] MRN: [id number] DOB: 10/23/1945    LOS: 2  Referring Provider:  Preston Fleeting, ED Reason for Referral:  Shock, lactic acidosis & acute renal failure   PULMONARY / CRITICAL CARE MEDICINE  Brief patient description: 65/F admitted 7/15 with shock, lactic acidosis & acute renal failure on benazepril. She presented with vomiting x 4ds, husband found her in a couch after a fall, with garbled speech, she reports lightheadedness but no LOC.She hit her head once and hit her knee another time. Husband thought that she had a left facial droop and that her speech was slurred. This resolved by the time of ED arrival. Found to be hypotenisve, required dopamine after 3 L fluid. Labs showed lactate 10, BUN/ cr 40/3.4  Events Since Admission: 7/15  Admitted for shock, lactic acidosis and acute renal failure 7/17  OR  Current Status:  No acute overnight events.  Per surgery OR later on today.  Vital Signs: Temp:  [99 F (37.2 C)-100.2 F (37.9 C)] 99 F (37.2 C) (07/17 0746) Pulse Rate:  [92-105] 96  (07/17 0700) Resp:  [18-25] 18  (07/17 0700) BP: (93-125)/(43-66) 125/53 mmHg (07/17 0700) SpO2:  [96 %-100 %] 100 % (07/17 0700) Weight:  [91.173 kg (201 lb)] 91.173 kg (201 lb) (07/17 0444)  Physical Examination: General:  Comfortable, no distress Neuro:  Awake, alert, cooperative, non-focal HEENT: PERRL, NGT / bilious fluid Neck:  No JVD Cardiovascular:  RRR, no murmurs Lungs:  Clear bilateral air entry Abdomen:  Distended, old incisional hernia, mildly tender, no rebound Musculoskeletal:  Intact  Skin:  Lt facial bruise, lt knee bruise and rt abrasions   Active Problems:  HYPERLIPIDEMIA  HYPERTENSION  Septic shock  Acute renal failure  Small bowel obstruction  ASSESSMENT AND PLAN  PULMONARY No results found for this basename: PHART:5,PCO2:5,PCO2ART:5,PO2ART:5,HCO3:5,O2SAT:5 in the last 168 hours  A:  No acute issue P:   Supplemental oxygen, keep  SpO2>92%  CARDIOVASCULAR  Lab 01/21/12 0512 01/20/12 2126 01/20/12 1504 01/20/12 1352 01/20/12 1105 01/20/12 1101  TROPONINI <0.30 <0.30 -- <0.30 -- <0.30  LATICACIDVEN -- -- 2.1 -- 10.8* --  PROBNP -- -- -- -- -- --   ECG:  7/15 >>> NSR, no ST-T changes Lines: 7/15 Lt I J CVL>>  A: Shock from presumed dehydration with continued antihypertensive use in setting of large volume emesis.  Resolved.  Now normotensive, off pressors.  History of hyperlipidemia / hypertension. P:  Telemetry Hold preadmission Rx  RENAL  Lab 01/22/12 0445 01/21/12 0512 01/20/12 1503 01/20/12 1101  NA 140 134* 136 131*  K 3.4* 3.9 -- --  CL 107 101 93* 80*  CO2 22 19 23  18*  BUN 30* 42* 40* 40*  CREATININE 1.54* 3.51* 3.49* 3.41*  CALCIUM 8.2* 7.7* 8.2* 10.2  MG -- 1.8 -- --  PHOS -- -- -- --   Intake/Output      07/16 0701 - 07/17 0700 07/17 0701 - 07/18 0700   I.V. (mL/kg) 3376.4 (37) 100 (1.1)   IV Piggyback 250 100   Total Intake(mL/kg) 3626.4 (39.8) 200 (2.2)   Urine (mL/kg/hr) 1710 (0.8) 150   Emesis/NG output 2425    Total Output 4135 150   Net -508.6 +50         Foley:  7/15 >>>  A:  Acute renal failure without hyperkalemia, likely AKI, improving with hydration. Hypokalemia. P:   K replacement IVF Trend BMP  GASTROINTESTINAL  Lab 01/20/12 1101  AST 50*  ALT 21  ALKPHOS 79  BILITOT 2.3*  PROT 8.6*  ALBUMIN 3.6   CT abdomen:  7/15 >>> Dilated loops of small bowel concerning for small bowel obstruction, though an exact transition point is not identified presumably secondary to either an adhesion or adjacent ventral wall abdominal hernias. Scattered foci of nondependent air within dilated loopd of small bowel within the right mid hemiabdomen, while presumably intraluminal, a small amount of pneumatosis is not excluded. No definite portal venous gas or pneumoperitoneum. Indeterminate 2.1 cm hypoattenuating lesion within the dome of the left lobe the liver, a fully evaluated without  intravenous contrast. Comparison to prior examinations (if available) is recommended. If no comparisons exist, further evaluation with non emergent contrast enhanced CT or MRI may be performed as clinically indicated. Likely right-sided gonadal vein phleboliths without evidence of urinary obstruction. Dilated presumably venous collaterals within the inferior aspect of the abdomen of uncertain etiology or clinical significance. Lumbar spine degenerative change.  A: SBO, likely secondary to adhesions.  Hernias.  P:   NPO NGT to Sx Surgery following OR later on today  HEMATOLOGIC  Lab 01/22/12 0445 01/21/12 0512 01/20/12 1101  HGB 7.0* 7.5* 9.5*  HCT 25.1* 25.8* 32.6*  PLT 184 201 303  INR -- -- --  APTT -- -- --   A: Anemia P:  Trend CBC May need transfusion expecting surgical blood loss Type and screen  INFECTIOUS  Lab 01/22/12 0445 01/21/12 0512 01/20/12 1101  WBC 6.8 8.4 10.6*  PROCALCITON -- -- --   Cultures: 7/15  Blood >>> 7/15  Urine >>>  Antibiotics: 7/15 Primaxin >>>  A:  Possible peritonitis. P:   Antibiotics / cultures as above  ENDOCRINE  Lab 01/22/12 0358 01/22/12 0026 01/21/12 1951 01/21/12 1531 01/20/12 1619  GLUCAP 87 93 96 114* 148*   A: Hyperglycemia, improved. 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 Taking this limitation into account, no evidence of intracranial hemorrhage or CT evidence of large acute infarct. 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  A:  Dizziness most likely related to hypotension but has Hx of SAH with IR coiling 4 years ago, resolved.  Acute encephalopathy resolved. P:   Monitor  [facility] PRACTICE / DISPOSITION Level of Care:  ICU as expected to go to OR today Primary Service: PCCM Consultants:  Surgery Code Status:  Full Diet:  NPO DVT Px: SCDs GI Px: Not indicated Skin Integrity:   Rt knee abrasion Social / Family:  Family not available for rounds  Orlean Bradford, M.D., F.C.C.P. Pulmonary and Critical Care Medicine [facility] Cell: [phone] Pager: [phone]

## 2012-01-22 NOTE — Progress Notes (Signed)
ANTIBIOTIC CONSULT NOTE - FOLLOW UP  Pharmacy Consult for Primaxin Indication: empiric abdominal source  Allergies  Allergen Reactions  . Cephalexin Rash  . Penicillins Rash    Patient Measurements: Height: 5\' 5"  (165.1 cm) Weight: 201 lb (91.173 kg) IBW/kg (Calculated) : 57   Vital Signs: Temp: 97.8 F (36.6 C) (07/17 1127) Temp src: Oral (07/17 1127) BP: 122/64 mmHg (07/17 1000) Pulse Rate: 93  (07/17 1000) Intake/Output from previous day: 07/16 0701 - 07/17 0700 In: 3626.4 [I.V.:3376.4; IV Piggyback:250] Out: 4135 [Urine:1710; Emesis/NG output:2425] Intake/Output from this shift: Total I/O In: 330 [I.V.:200; NG/GT:30; IV Piggyback:100] Out: 150 [Urine:150]  Labs:  Basename 01/22/12 0445 01/21/12 0512 01/20/12 1503 01/20/12 1101  WBC 6.8 8.4 -- 10.6*  HGB 7.0* 7.5* -- 9.5*  PLT 184 201 -- 303  LABCREA -- -- -- --  CREATININE 1.54* 3.51* 3.49* --   Estimated Creatinine Clearance: 41.7 ml/min (by C-G formula based on Cr of 1.54). No results found for this basename: VANCOTROUGH:2,VANCOPEAK:2,VANCORANDOM:2,GENTTROUGH:2,GENTPEAK:2,GENTRANDOM:2,TOBRATROUGH:2,TOBRAPEAK:2,TOBRARND:2,AMIKACINPEAK:2,AMIKACINTROU:2,AMIKACIN:2, in the last 72 hours   Microbiology: Recent Results (from the past 720 hour(s))  CULTURE, BLOOD (ROUTINE X 2)     Status: Normal (Preliminary result)   Collection Time   01/20/12  1:50 PM      Component Value Range Status Comment   Specimen Description BLOOD ARM LEFT   Final    Special Requests BOTTLES DRAWN AEROBIC AND ANAEROBIC 10CC   Final    Culture  Setup Time 01/20/2012 23:17   Final    Culture     Final    Value:        BLOOD CULTURE RECEIVED NO GROWTH TO DATE CULTURE WILL BE HELD FOR 5 DAYS BEFORE ISSUING A FINAL NEGATIVE REPORT   Report Status PENDING   Incomplete   CULTURE, BLOOD (ROUTINE X 2)     Status: Normal (Preliminary result)   Collection Time   01/20/12  2:03 PM      Component Value Range Status Comment   Specimen Description  BLOOD HAND LEFT   Final    Special Requests BOTTLES DRAWN AEROBIC ONLY 10CC   Final    Culture  Setup Time 01/20/2012 23:18   Final    Culture     Final    Value:        BLOOD CULTURE RECEIVED NO GROWTH TO DATE CULTURE WILL BE HELD FOR 5 DAYS BEFORE ISSUING A FINAL NEGATIVE REPORT   Report Status PENDING   Incomplete   SURGICAL PCR SCREEN     Status: Normal   Collection Time   01/20/12  4:34 PM      Component Value Range Status Comment   MRSA, PCR NEGATIVE  NEGATIVE Final    Staphylococcus aureus NEGATIVE  NEGATIVE Final   URINE CULTURE     Status: Normal   Collection Time   01/20/12  7:15 PM      Component Value Range Status Comment   Specimen Description URINE, CATHETERIZED   Final    Special Requests NONE   Final    Culture  Setup Time 01/20/2012 20:07   Final    Colony Count NO GROWTH   Final    Culture NO GROWTH   Final    Report Status 01/21/2012 FINAL   Final     Anti-infectives     Start     Dose/Rate Route Frequency Ordered Stop   01/22/12 1400   imipenem-cilastatin (PRIMAXIN) 500 mg in sodium chloride 0.9 % 100 mL IVPB  500 mg 200 mL/hr over 30 Minutes Intravenous 3 times per day 01/22/12 1147     01/20/12 1500   imipenem-cilastatin (PRIMAXIN) 250 mg in sodium chloride 0.9 % 100 mL IVPB  Status:  Discontinued        250 mg 200 mL/hr over 30 Minutes Intravenous Every 12 hours 01/20/12 1400 01/22/12 1147   01/20/12 1400   imipenem-cilastatin (PRIMAXIN) 500 mg in sodium chloride 0.9 % 100 mL IVPB  Status:  Discontinued        500 mg 200 mL/hr over 30 Minutes Intravenous 3 times per day 01/20/12 1331 01/20/12 1358          Assessment: 66 yo F admitted 7/15 with shock.  To OR today for SBO.  Started on Primaxin empirically for abdominal source.  Cultures have not revealed anything.  Renal function continues to improve.    Goal of Therapy:  resolution of infection  Plan:  1.  Increase Primaxin to 500 mg IV q8hr 2.  F/up cultures, renal function  Rolland Porter, Pharm.D., BCPS Clinical Pharmacist Pager: [phone] 01/22/2012,11:49 AM

## 2012-01-22 NOTE — Anesthesia Procedure Notes (Signed)
Procedure Name: Intubation Date/Time: 01/22/2012 12:28 PM Performed by: Rossie Muskrat L Pre-anesthesia Checklist: Patient identified, Timeout performed, Emergency Drugs available, Suction available and Patient being monitored Patient Re-evaluated:Patient Re-evaluated prior to inductionOxygen Delivery Method: Circle system utilized Preoxygenation: Pre-oxygenation with 100% oxygen Intubation Type: IV induction Ventilation: Mask ventilation without difficulty Laryngoscope Size: Miller and 2 Tube type: Oral Number of attempts: 1 Airway Equipment and Method: Stylet Placement Confirmation: ETT inserted through vocal cords under direct vision,  breath sounds checked- equal and bilateral and positive ETCO2 Secured at: 21 cm Tube secured with: Tape Dental Injury: Teeth and Oropharynx as per pre-operative assessment

## 2012-01-22 NOTE — Transfer of Care (Signed)
Immediate Anesthesia Transfer of Care Note  Patient: [name] [name]  Procedure(s) Performed: Procedure(s) (LRB): EXPLORATORY LAPAROTOMY (N/A)  Patient Location: PACU  Anesthesia Type: General  Level of Consciousness: awake, alert, oriented  Airway & Oxygen Therapy: Patient Spontanous Breathing and Patient connected to face mask oxygen  Post-op Assessment: Report given to PACU RN, Post -op Vital signs reviewed and stable and Patient moving all extremities  Post vital signs: Reviewed and stable  Complications: No apparent anesthesia complications

## 2012-01-22 NOTE — Anesthesia Postprocedure Evaluation (Signed)
  Anesthesia Post-op Note  Patient: [name] [name]  Procedure(s) Performed: Procedure(s) (LRB): EXPLORATORY LAPAROTOMY (N/A)  Patient Location: PACU  Anesthesia Type: General  Level of Consciousness: awake, alert  and oriented  Airway and Oxygen Therapy: Patient Spontanous Breathing  Post-op Pain: mild  Post-op Assessment: Post-op Vital signs reviewed and Patient's Cardiovascular Status Stable  Post-op Vital Signs: stable  Complications: No apparent anesthesia complications

## 2012-01-22 NOTE — Progress Notes (Signed)
Report given to shelly rn as caregiver 

## 2012-01-22 NOTE — Progress Notes (Signed)
Call placed to [facility] for request for pain medication for pt.

## 2012-01-22 NOTE — Progress Notes (Signed)
Subjective: Feels ok, still with bilious ng output, no flatus/bm  Objective: Vital signs in last 24 hours: Temp:  [98.1 F (36.7 C)-100.2 F (37.9 C)] 99 F (37.2 C) (07/17 0746) Pulse Rate:  [92-105] 96  (07/17 0700) Resp:  [18-25] 18  (07/17 0700) BP: (93-125)/(43-66) 125/53 mmHg (07/17 0700) SpO2:  [96 %-100 %] 100 % (07/17 0700) Weight:  [201 lb (91.173 kg)] 201 lb (91.173 kg) (07/17 0444) Last BM Date: 01/17/12  Intake/Output from previous day: 07/16 0701 - 07/17 0700 In: 3626.4 [I.V.:3376.4; IV Piggyback:250] Out: 4135 [Urine:1710; Emesis/NG output:2425] Intake/Output this shift:    General appearance: no distress GI: distended, bottom hernia reducible, top hernia not reducible but not really tender, no real bs  Lab Results:   Basename 01/22/12 0445 01/21/12 0512  WBC 6.8 8.4  HGB 7.0* 7.5*  HCT 25.1* 25.8*  PLT 184 201   BMET  Basename 01/22/12 0445 01/21/12 0512  NA 140 134*  K 3.4* 3.9  CL 107 101  CO2 22 19  GLUCOSE 93 159*  BUN 30* 42*  CREATININE 1.54* 3.51*  CALCIUM 8.2* 7.7*   PT/INR No results found for this basename: LABPROT:2,INR:2 in the last 72 hours ABG No results found for this basename: PHART:2,PCO2:2,PO2:2,HCO3:2 in the last 72 hours  Studies/Results: Ct Abdomen Pelvis Wo Contrast  01/20/2012  *RADIOLOGY REPORT*  Clinical Data: Nausea and vomiting for 4 days, post fall, abdominal distension, reducible hernia, shock, lactic acidosis  CT ABDOMEN AND PELVIS WITHOUT CONTRAST  Technique:  Multidetector CT imaging of the abdomen and pelvis was performed following the standard protocol without intravenous contrast.  Comparison: Chest radiograph - earlier same day; abdominal radiographic series - 12/09/2006  Findings:  The lack of intravenous contrast limits the ability to evaluate solid abdominal organs.  Normal hepatic contour. There is an ill defined approximate 1.6 x 2.1 cm hypoattenuating lesion with the dome of the left lobe of the liver  (image 90, series 2) which is too small which is incompletely evaluated without intravenous contrast.  Normal noncontrast appearance of the gallbladder.  No ascites.  Normal noncontrast appearance of the bilateral kidneys.  No urinary obstruction.  Note is made of opacities measuring approximately 6 mm (image 62, series two) and 4 mm (image 51) adjacent to the expected location of the right ureter.  These findings are without associated upstream ureterectasis and favored to be within the adjacent right gonadal vein (gonadal veins phleboliths).  The urinary bladder is decompressed with a Foley catheter.  Normal noncontrast appearance of the bilateral adrenal glands and pancreas.  Scattered punctate calcifications with an otherwise normal-appearing spleen, favored to be the sequela of prior granulomas infection.  There are two adjacent anterior ventral wall abdominal hernias, one of which demonstrates a wide neck measuring approximately 8.7 cm in greatest transverse axial dimension (image 59) the other of which is narrow neck measuring approximately 3.5 cm (image 52).  Both of these hernias containing dilated loops of small bowel however do not definitely appear to be a transition point.  Of note, the narrow neck hernia does have a minimal amount of mesenteric stranding about its origin (image 52).  The wide neck hernia does contain a short segment of nondilated transverse colon.  The small bowel is diffusely distended to the level of the cecum. The colon is largely decompressed to the level of the cecum (which is incidentally noted within the midline of the lower abdomen (image 59, series 2). Overall findings are compatible small bowel obstruction,  though exact transition point is not identified. Postsurgical change within a loop of small bowel within the anterior aspect of the right mid hemiabdomen (images 37 through 44).  There are multiple scattered foci of air within a dilated loop of small bowel within the right  mid hemiabdomen.  No pneumoperitoneum or portal venous gas.  Scattered atherosclerotic calcifications within a mildly tortuous but normal caliber abdominal aorta.  There is a cluster of dilated presumable venous collaterals within the lower abdomen (image 57, series 2) of uncertain etiology or clinical significance.  Scattered colonic diverticulosis without evidence of diverticulitis.  Limited visualization of the lower thorax demonstrates small bilateral pleural effusions, left greater than right. Calcified granuloma within the left lower lobe (image four), the sequela of prior granulomatous infection.  Minimal bibasilar dependent opacities favored to represent atelectasis.  Normal heart size. Small amount of pericardial fluid, presumably physiologic. Extensive calcifications within the mitral valve annulus.  No acute or aggressive osseous abnormalities.  Moderate multilevel lumbar spine degenerative change, worst at L3 - L4, L4 - L5 and L5 - S1.  Bilateral moderate to severe facet degenerative change at L5 - S1.  IMPRESSION:  1. Dilated loops of small bowel concerning for small bowel obstruction, though an exact transition point is not identified presumably secondary to either an adhesion or adjacent ventral wall abdominal hernias. 2.  Scattered foci of nondependent air within dilated loopd of small bowel within the right mid hemiabdomen, while presumably intraluminal, a small amount of pneumatosis is not excluded.  No definite portal venous gas or pneumoperitoneum.  3. Indeterminate 2.1 cm hypoattenuating lesion within the dome of the left lobe the liver, a fully evaluated without intravenous contrast.  Comparison to prior examinations (if available) is recommended.  If no comparisons exist, further evaluation with non emergent contrast enhanced CT or MRI may be performed as clinically indicated.  4.  Likely right-sided gonadal vein phleboliths without evidence of urinary obstruction.  5.  Dilated presumably  venous collaterals within the inferior aspect of the abdomen of uncertain etiology or clinical significance.  6.  Lumbar spine degenerative change.  Above findings discussed with Dr. Jamesetta So at [phone].  Original Report Authenticated By: Waynard Reeds, M.D.   Ct Head Wo Contrast  01/20/2012  *RADIOLOGY REPORT*  Clinical Data: Fall.  Slurred speech.  Left facial droop.  CT HEAD WITHOUT CONTRAST  Technique:  Contiguous axial images were obtained from the base of the skull through the vertex without contrast.  Comparison: 01/20/2005.  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 Taking this limitation into account, no evidence of intracranial hemorrhage or CT evidence of large acute infarct.  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  Mild global atrophy without hydrocephalus.  No skull fracture.  Vascular calcifications.  Orbital structures appear to be grossly intact.  Visualized sinuses and mastoid air cells are clear.  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  Taking this limitation into account, no evidence of intracranial hemorrhage or CT evidence of large acute infarct.  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  Original Report Authenticated By: Fuller Canada, M.D.   Dg Chest Port 1 View  01/20/2012  *RADIOLOGY REPORT*  Clinical Data: Central line placement, history hypertension  PORTABLE CHEST - 1 VIEW  Comparison: Portable exam 1347 hours compared to 01/20/2012  Findings: Left jugular line, tip projecting over SVC. Upper  normal heart size. Mitral annular calcification noted. Mediastinal contours and pulmonary vascularity normal. Question mild atelectasis at left base. Lungs otherwise clear. No pneumothorax. Bones appear demineralized.  IMPRESSION: No pneumothorax following left jugular line placement. Left  basilar atelectasis.  Original Report Authenticated By: Lollie Marrow, M.D.   Dg Chest Portable 1 View  01/20/2012  *RADIOLOGY REPORT*  Clinical Data: Dizziness, hypotension  PORTABLE CHEST - 1 VIEW  Comparison: Portable exam 1052 hours repeated at 1058 hours compared to 12/09/2006  Findings: Rotated to left on both images. Upper normal heart size with mitral annular calcification. Atherosclerotic calcification aorta. Mediastinal contours and pulmonary vascularity normal. Left basilar atelectasis. Lungs otherwise clear. No pleural effusion or pneumothorax. Bones demineralized.  IMPRESSION: Left basilar atelectasis.  Original Report Authenticated By: Lollie Marrow, M.D.   Dg Abd Portable 1v  01/20/2012  *RADIOLOGY REPORT*  Clinical Data: A NG tube placement.  PORTABLE ABDOMEN - 1 VIEW  Comparison: CT of abdomen and pelvis 01/20/2012.  Findings: Tip of nasogastric tube is in the stomach, and the side port is within the proximal stomach.  Multiple dilated loops of gas- filled small bowel are noted, measuring up to 5.4 cm in diameter. The abdomen is incompletely visualized on this single-view examination.  No gross evidence of pneumoperitoneum is identified at this time.  IMPRESSION: 1.  Feeding tube is located within the stomach. 2.  Findings again consistent with small bowel obstruction.  Original Report Authenticated By: Florencia Reasons, M.D.   Dg Abd Portable 2v  01/22/2012  *RADIOLOGY REPORT*  Clinical Data: 66 year old female with bowel obstruction.  Renal failure. Nausea and vomiting.  PORTABLE ABDOMEN - 2 VIEW  Comparison: 01/21/2012 and earlier.  Findings: Portable upright view of the abdomen.  Left pleural effusion.  Enteric tube in place, side hole the level of the gastric body.  No pneumoperitoneum.  Continued disproportionate small bowel gas.  Small bowel loops now measure up to 50 mm diameter (unchanged).  Overall, the gas pattern is not significantly changed since 01/20/2012. No acute osseous  abnormality identified.  IMPRESSION: 1.  Gas pattern compatible with partial small bowel obstruction and unchanged since 01/20/2012.  No free air. 2.  Left pleural effusion and left lung base pulmonary opacity. 3.  NG tube in place at the level of the stomach.  Original Report Authenticated By: Harley Hallmark, M.D.   Dg Abd Portable 2v  01/21/2012  *RADIOLOGY REPORT*  Clinical Data: Abdominal pain.  Bowel obstruction.  PORTABLE ABDOMEN - 2 VIEW  Comparison: 01/20/2012  Findings: NG tube remains in stable position within the stomach. Decreasing distention of small bowel loops.  No evidence of free air.  No organomegaly.  Degenerative changes in the thoracic spine. No acute bony abnormality.  IMPRESSION: Slight decreased gaseous distention of small bowel.  Original Report Authenticated By: Cyndie Chime, M.D.    Anti-infectives: Anti-infectives     Start     Dose/Rate Route Frequency Ordered Stop   01/20/12 1500   imipenem-cilastatin (PRIMAXIN) 250 mg in sodium chloride 0.9 % 100 mL IVPB        250 mg 200 mL/hr over 30 Minutes Intravenous Every 12 hours 01/20/12 1400     01/20/12 1400   imipenem-cilastatin (PRIMAXIN) 500 mg in sodium chloride 0.9 % 100 mL IVPB  Status:  Discontinued        500 mg 200 mL/hr over 30 Minutes Intravenous 3 times per day 01/20/12 1331 01/20/12 1358  Assessment/Plan: SBO  Clinically she has complete sbo that I cannot definitively say is not related to hernia.  I think it is adhesive in nature. She has had prior sbr for perforation and infection postop.  I think she needs to go to or today and I recommended this to her.  She is agreeable.  We discussed indication being ongoing obstruction without improvement as well as hernias.  Risks include but are not limited to bleeding, infection, reoperation, bowel resection, fistula, anastomotic leak, likely recurrent hernia as I will probably not repair with mesh. She understands goal is to relieve obstruction and  not to fix hernias today.  Husband is on way in and will discuss with him when he arrives.   LOS: 2 days    [facility] 01/22/2012

## 2012-01-22 NOTE — Progress Notes (Signed)
Call to dr. Noreene Larsson who said he would sign patient out and ok for pt to go to 2100

## 2012-01-23 ENCOUNTER — Encounter (HOSPITAL_COMMUNITY): Payer: Self-pay | Admitting: General Surgery

## 2012-01-23 DIAGNOSIS — D649 Anemia, unspecified: Secondary | ICD-10-CM

## 2012-01-23 LAB — BASIC METABOLIC PANEL
BUN: 18 mg/dL (ref 6–23)
Creatinine, Ser: 1.03 mg/dL (ref 0.50–1.10)
GFR calc Af Amer: 66 mL/min — ABNORMAL LOW (ref >90)
GFR calc non Af Amer: 57 mL/min — ABNORMAL LOW (ref >90)

## 2012-01-23 LAB — GLUCOSE, CAPILLARY
Glucose-Capillary: 101 mg/dL — ABNORMAL HIGH (ref 70–99)
Glucose-Capillary: 111 mg/dL — ABNORMAL HIGH (ref 70–99)

## 2012-01-23 LAB — CBC: Platelets: 188 10*3/uL (ref 150–400)

## 2012-01-23 MED ORDER — METOPROLOL TARTRATE 1 MG/ML IV SOLN
2.5000 mg | Freq: Four times a day (QID) | INTRAVENOUS | Status: DC
  Administered 2012-01-23 – 2012-01-24 (×6): 2.5 mg via INTRAVENOUS
  Filled 2012-01-23 (×3): qty 5
  Filled 2012-01-23: qty 10
  Filled 2012-01-23 (×4): qty 5

## 2012-01-23 NOTE — Progress Notes (Signed)
1 Day Post-Op  Subjective: Feels ok, tachycardic, no flatus/bm  Objective: Vital signs in last 24 hours: Temp:  [96.8 F (36 C)-99.9 F (37.7 C)] 99.3 F (37.4 C) (07/18 0425) Pulse Rate:  [90-127] 122  (07/18 0100) Resp:  [12-31] 17  (07/18 0100) BP: (110-173)/(60-97) 123/64 mmHg (07/18 0100) SpO2:  [85 %-100 %] 98 % (07/18 0100) Last BM Date: 01/17/12  Intake/Output from previous day: 07/17 0701 - 07/18 0700 In: 3740 [I.V.:3000; NG/GT:30; IV Piggyback:710] Out: 1490 [Urine:1340; Drains:50; Blood:100] Intake/Output this shift:    General appearance: no distress GI: mildly distended, no bs, drains with serosang fluid, dressing dry  Lab Results:   [facility] 01/23/12 0550 01/22/12 0445  WBC 6.0 6.8  HGB 7.1* 7.0*  HCT 25.5* 25.1*  PLT 188 184   BMET  Basename 01/23/12 0550 01/22/12 0445  NA 141 140  K 4.0 3.4*  CL 109 107  CO2 22 22  GLUCOSE 106* 93  BUN 18 30*  CREATININE 1.03 1.54*  CALCIUM 8.2* 8.2*   PT/INR No results found for this basename: LABPROT:2,INR:2 in the last 72 hours ABG No results found for this basename: PHART:2,PCO2:2,PO2:2,HCO3:2 in the last 72 hours  Studies/Results: Dg Abd Portable 2v  01/22/2012  *RADIOLOGY REPORT*  Clinical Data: 66 year old female with bowel obstruction.  Renal failure. Nausea and vomiting.  PORTABLE ABDOMEN - 2 VIEW  Comparison: 01/21/2012 and earlier.  Findings: Portable upright view of the abdomen.  Left pleural effusion.  Enteric tube in place, side hole the level of the gastric body.  No pneumoperitoneum.  Continued disproportionate small bowel gas.  Small bowel loops now measure up to 50 mm diameter (unchanged).  Overall, the gas pattern is not significantly changed since 01/20/2012. No acute osseous abnormality identified.  IMPRESSION: 1.  Gas pattern compatible with partial small bowel obstruction and unchanged since 01/20/2012.  No free air. 2.  Left pleural effusion and left lung base pulmonary opacity. 3.  NG tube  in place at the level of the stomach.  Original Report Authenticated By: Harley Hallmark, M.D.     Assessment/Plan: POD 1 exlap, loa for sbo 1. Cont iv pain meds 2. Npo, ngt until ileus resolves 3. May start pharm proph for dvt today   LOS: 3 days    [facility] 01/23/2012

## 2012-01-23 NOTE — Progress Notes (Signed)
Name: [name] [name] MRN: [id number] DOB: Aug 19, 1945    LOS: 3  Referring Provider:  Preston Fleeting, ED Reason for Referral:  Shock, lactic acidosis & acute renal failure   PULMONARY / CRITICAL CARE MEDICINE  Brief patient description: 65/F admitted 7/15 with shock, lactic acidosis & acute renal failure on benazepril. She presented with vomiting x 4ds, husband found her in a couch after a fall, with garbled speech, she reports lightheadedness but no LOC.She hit her head once and hit her knee another time. Husband thought that she had a left facial droop and that her speech was slurred. This resolved by the time of ED arrival. Found to be hypotenisve, required dopamine after 3 L fluid. Labs showed lactate 10, BUN/ cr 40/3.4  Events Since Admission: 7/15  Admitted for shock, lactic acidosis and acute renal failure 7/17  Lysis of adhesions, extubated in PACU  Current Status:  No acute overnight events.  Per surgery OR later on today.  Vital Signs: Temp:  [96.8 F (36 C)-99.9 F (37.7 C)] 98.9 F (37.2 C) (07/18 0801) Pulse Rate:  [90-133] 118  (07/18 1000) Resp:  [12-31] 17  (07/18 1000) BP: (107-173)/(55-97) 115/78 mmHg (07/18 1000) SpO2:  [85 %-100 %] 98 % (07/18 1000) Weight:  [94.2 kg (207 lb 10.8 oz)] 94.2 kg (207 lb 10.8 oz) (07/18 0600)  Physical Examination: General:  Comfortable, no distress Neuro:  Awake, alert, cooperative, non-focal HEENT: PERRL, NGT / decreased output Neck:  No JVD Cardiovascular:  RRR, no murmurs Lungs:  Clear bilateral air entry Abdomen:  Surgical dressing c/d/i, diminished breath sounds Musculoskeletal:  Intact  Skin:  Lt facial bruise, lt knee bruise and rt abrasions   Active Problems:  HYPERLIPIDEMIA  HYPERTENSION  Septic shock  Acute renal failure  Small bowel obstruction  ASSESSMENT AND PLAN  PULMONARY No results found for this basename: PHART:5,PCO2:5,PCO2ART:5,PO2ART:5,HCO3:5,O2SAT:5 in the last 168 hours  A:  No acute issue P:     Supplemental oxygen, keep SpO2>92%  CARDIOVASCULAR  Lab 01/21/12 0512 01/20/12 2126 01/20/12 1504 01/20/12 1352 01/20/12 1105 01/20/12 1101  TROPONINI <0.30 <0.30 -- <0.30 -- <0.30  LATICACIDVEN -- -- 2.1 -- 10.8* --  PROBNP -- -- -- -- -- --   ECG:  7/15 >>> NSR, no ST-T changes Lines: 7/15 Lt I J CVL>>  A: Shock from dehydration in the setting of large volume emesis / taking HTN Rx .  Resolved.  History of hyperlipidemia / hypertension. Tachycardia. P:  Start Metoprolol 2.5 mg IV q6h (on beta blocker preadmission)  RENAL  Lab 01/23/12 0550 01/22/12 0445 01/21/12 0512 01/20/12 1503 01/20/12 1101  NA 141 140 134* 136 131*  K 4.0 3.4* -- -- --  CL 109 107 101 93* 80*  CO2 22 22 19 23  18*  BUN 18 30* 42* 40* 40*  CREATININE 1.03 1.54* 3.51* 3.49* 3.41*  CALCIUM 8.2* 8.2* 7.7* 8.2* 10.2  MG -- -- 1.8 -- --  PHOS -- -- -- -- --   Intake/Output      07/17 0701 - 07/18 0700 07/18 0701 - 07/19 0700   I.V. (mL/kg) 3200 (34) 319.5 (3.4)   NG/GT 30 30   IV Piggyback 710    Total Intake(mL/kg) 3940 (41.8) 349.5 (3.7)   Urine (mL/kg/hr) 1400 (0.6) 135   Emesis/NG output     Drains 70    Blood 100    Total Output 1570 135   Net +2370 +214.5         Foley:  7/15 >>>  A:  Acute renal failure without hyperkalemia, likely AKI, resolved. P:   Maintenance IVF Trend BMP  GASTROINTESTINAL  Lab 01/20/12 1101  AST 50*  ALT 21  ALKPHOS 79  BILITOT 2.3*  PROT 8.6*  ALBUMIN 3.6   CT abdomen:  7/15 >>> Dilated loops of small bowel concerning for small bowel obstruction, though an exact transition point is not identified presumably secondary to either an adhesion or adjacent ventral wall abdominal hernias. Scattered foci of nondependent air within dilated loopd of small bowel within the right mid hemiabdomen, while presumably intraluminal, a small amount of pneumatosis is not excluded. No definite portal venous gas or pneumoperitoneum. Indeterminate 2.1 cm hypoattenuating lesion  within the dome of the left lobe the liver, a fully evaluated without intravenous contrast. Comparison to prior examinations (if available) is recommended. If no comparisons exist, further evaluation with non emergent contrast enhanced CT or MRI may be performed as clinically indicated. Likely right-sided gonadal vein phleboliths without evidence of urinary obstruction. Dilated presumably venous collaterals within the inferior aspect of the abdomen of uncertain etiology or clinical significance. Lumbar spine degenerative change.  A: SBO, s/p lisis of adhesions.  Ventral hernia, s/p repair. P:   NPO NGT to Sx Surgery following  HEMATOLOGIC  Lab 01/23/12 0550 01/22/12 0445 01/21/12 0512 01/20/12 1101  HGB 7.1* 7.0* 7.5* 9.5*  HCT 25.5* 25.1* 25.8* 32.6*  PLT 188 184 201 303  INR -- -- -- --  APTT -- -- -- --   A: Anemia, stable. P:  Trend CBC  INFECTIOUS  Lab 01/23/12 0550 01/22/12 0445 01/21/12 0512 01/20/12 1101  WBC 6.0 6.8 8.4 10.6*  PROCALCITON -- -- -- --   Cultures: 7/15  Blood >>> 7/15  Urine >>>  Antibiotics: 7/15 Primaxin >>>  A:  Possible peritonitis. P:   Antibiotics / cultures as above Duration of therapy per Surgery  ENDOCRINE  Lab 01/23/12 0800 01/23/12 0424 01/23/12 0024 01/22/12 1956 01/22/12 1626  GLUCAP 111* 101* 112* 103* 119*   A: Hyperglycemia, improved. 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 Taking this limitation into account, no evidence of intracranial hemorrhage or CT evidence of large acute infarct. 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  A:  Dizziness most likely related to hypotension but has Hx of SAH with IR coiling 4 years ago, resolved.  Acute encephalopathy resolved. P:   Monitor  BEST PRACTICE / DISPOSITION Level of Care:  Downgrade to SDU Primary Service: TRH. PCCM will sign  off. Consultants:  Surgery Code Status:  Full Diet:  NPO DVT Px: SCDs GI Px: Not indicated Skin Integrity:  Rt knee abrasion Social / Family:  Family updated in rounds.  Orlean Bradford, M.D., F.C.C.P. Pulmonary and Critical Care Medicine [facility] Cell: [phone] Pager: [phone]

## 2012-01-24 DIAGNOSIS — I10 Essential (primary) hypertension: Secondary | ICD-10-CM

## 2012-01-24 DIAGNOSIS — K5730 Diverticulosis of large intestine without perforation or abscess without bleeding: Secondary | ICD-10-CM

## 2012-01-24 DIAGNOSIS — D62 Acute posthemorrhagic anemia: Secondary | ICD-10-CM | POA: Diagnosis not present

## 2012-01-24 LAB — CBC
MCV: 85.5 fL (ref 78.0–100.0)
Platelets: 177 10*3/uL (ref 150–400)
Platelets: 185 10*3/uL (ref 150–400)
RBC: 2.76 MIL/uL — ABNORMAL LOW (ref 3.87–5.11)
RBC: 3.52 MIL/uL — ABNORMAL LOW (ref 3.87–5.11)
RDW: 18.2 % — ABNORMAL HIGH (ref 11.5–15.5)
RDW: 16.8 % — ABNORMAL HIGH (ref 11.5–15.5)
WBC: 5.9 10*3/uL (ref 4.0–10.5)
WBC: 6.6 10*3/uL (ref 4.0–10.5)

## 2012-01-24 LAB — GLUCOSE, CAPILLARY
Glucose-Capillary: 100 mg/dL — ABNORMAL HIGH (ref 70–99)
Glucose-Capillary: 90 mg/dL (ref 70–99)
Glucose-Capillary: 91 mg/dL (ref 70–99)
Glucose-Capillary: 92 mg/dL (ref 70–99)
Glucose-Capillary: 94 mg/dL (ref 70–99)

## 2012-01-24 LAB — PREPARE RBC (CROSSMATCH)

## 2012-01-24 LAB — BASIC METABOLIC PANEL
CO2: 23 mEq/L (ref 19–32)
Chloride: 113 mEq/L — ABNORMAL HIGH (ref 96–112)
GFR calc Af Amer: 84 mL/min — ABNORMAL LOW (ref >90)
Potassium: 3.9 mEq/L (ref 3.5–5.1)
Sodium: 146 mEq/L — ABNORMAL HIGH (ref 135–145)

## 2012-01-24 MED ORDER — SODIUM CHLORIDE 0.45 % IV SOLN
INTRAVENOUS | Status: DC
  Administered 2012-01-24 – 2012-01-25 (×2): via INTRAVENOUS
  Administered 2012-01-25: 100 mL via INTRAVENOUS
  Administered 2012-01-25: 06:00:00 via INTRAVENOUS
  Administered 2012-01-26: 50 mL via INTRAVENOUS

## 2012-01-24 MED ORDER — SODIUM CHLORIDE 0.9 % IV SOLN
500.0000 mg | Freq: Four times a day (QID) | INTRAVENOUS | Status: DC
  Administered 2012-01-24 – 2012-01-27 (×11): 500 mg via INTRAVENOUS
  Filled 2012-01-24 (×17): qty 500

## 2012-01-24 MED ORDER — METOPROLOL TARTRATE 1 MG/ML IV SOLN
5.0000 mg | Freq: Four times a day (QID) | INTRAVENOUS | Status: DC
  Administered 2012-01-24 – 2012-01-26 (×6): 5 mg via INTRAVENOUS
  Filled 2012-01-24 (×10): qty 5

## 2012-01-24 NOTE — Care Management Note (Signed)
    Page 1 of 1   01/28/2012     3:37:44 PM   CARE MANAGEMENT NOTE 01/28/2012  Patient:  [name] [name], [name] [name]   Account Number:  [id number]  Date Initiated:  01/21/2012  Documentation initiated by:  [name] [name]  Subjective/Objective Assessment:   Shock -  Has spouse.     Action/Plan:   pt eval- no pt needs.   Anticipated DC Date:  01/28/2012   Anticipated DC Plan:  HOME/SELF CARE      DC Planning Services  CM consult      Choice offered to / List presented to:             Status of service:  Completed, signed off Medicare Important Message given?   (If response is "NO", the following Medicare IM given date fields will be blank) Date Medicare IM given:   Date Additional Medicare IM given:    Discharge Disposition:  HOME/SELF CARE  Per UR Regulation:  Reviewed for med. necessity/level of care/duration of stay  If discussed at Long Length of Stay Meetings, dates discussed:    Comments:  Contact:  [name] [name], [name] [name] [phone]  01/28/12 15:36 Letha Cape RN BSN  [phone] patient dc to home, per physical therapy , pt has no pt needs.  01-24-12 11am [name] [name], RNBSN   [phone] Confirmed lives with husband - awake alert and very jolly. Family in room.  Independent prior to admission - No needs at this time.  Blood infusing - has NG tube in.

## 2012-01-24 NOTE — Progress Notes (Signed)
[facility] PROGRESS NOTE  KARE DADO ZOX:[id number] DOB: 11-Jan-1946 DOA: 01/20/2012 PCP: Judie Petit, MD  Assessment/Plan:   *Septic shock Due to bowel obstruction and possible peritonitis Cont Imipenem per surgery.     Small bowel obstruction S/p lysis of adhesions on 7/17 Being managed by surgery   Acute renal failure Due to sepsis, poor po intake, hypotension- now resolved   Anemia due to blood loss, acute Replaced with 2 units PRBC so far- hgb improved - will need iron profile prior to d/c- will hold off on it for now in case she receives more blood   HTN (hypertension) bP rising- will increase Metoprolol to 5 mg Q 6hr with holding parameters   HYPERLIPIDEMIA PO meds on hold due to post op ileus  Hypernatremia and hyperchloremia Mild- will switch to 1/2 NS- may need to change to D5- f/u Bmet  Code Status: Full code Family Communication: daughter and son at bedside Disposition Plan: transfer to surgical floor   Brief narrative: 66/F admitted 7/15 with shock, lactic acidosis & acute renal failure on benazepril. She presented with vomiting x 4ds, husband found her in a couch after a fall, with garbled speech, she reports lightheadedness but no LOC.She hit her head once and hit her knee another time. Husband thought that she had a left facial droop and that her speech was slurred. This resolved by the time of ED arrival. Found to be hypotenisve, required dopamine after 3 L fluid. Labs showed lactate 10, BUN/ cr 40/3.4   Consultants:  Surgery   Procedures: 7/17 Lysis of adhesions, extubated in PACU   Antibiotics: 7/15 Primaxin >>>  HPI/Subjective: Pt in good spirits. Pan is controlled. Has received 2 units of blood- no dyspnea currently. No new cough.   Objective: Filed Vitals:   01/24/12 1453 01/24/12 1559 01/24/12 2000 01/24/12 2130  BP: 161/90   179/96  Pulse: 93   90  Temp: 97.7 F (36.5 C)   99.2 F (37.3 C)  TempSrc: Oral   Oral   Resp: 20 18 22 18   Height: 5\' 5"  (1.651 m)     Weight:      SpO2: 98%  96% 95%    Intake/Output Summary (Last 24 hours) at 01/24/12 2239 Last data filed at 01/24/12 1850  Gross per 24 hour  Intake 2948.42 ml  Output   1350 ml  Net 1598.42 ml    Exam:   General:  Alert/ no distress  Cardiovascular: RRR, no murmurs  Respiratory: mild expiratory wheeze  Abdomen: soft, staple present in vertical incision which is clean and healing well, BS negative, mild diffuse tenderness  Ext- no c/c/e  Data Reviewed: Basic Metabolic Panel:  Lab 01/24/12 8119 01/23/12 0550 01/22/12 0445 01/21/12 0512 01/20/12 1503  NA 146* 141 140 134* 136  K 3.9 4.0 3.4* 3.9 3.2*  CL 113* 109 107 101 93*  CO2 23 22 22 19 23   GLUCOSE 105* 106* 93 159* 143*  BUN 12 18 30* 42* 40*  CREATININE 0.84 1.03 1.54* 3.51* 3.49*  CALCIUM 8.5 8.2* 8.2* 7.7* 8.2*  MG -- -- -- 1.8 --  PHOS -- -- -- -- --   Liver Function Tests:  Lab 01/20/12 1101  AST 50*  ALT 21  ALKPHOS 79  BILITOT 2.3*  PROT 8.6*  ALBUMIN 3.6   No results found for this basename: LIPASE:5,AMYLASE:5 in the last 168 hours No results found for this basename: AMMONIA:5 in the last 168 hours CBC:  Lab 01/24/12 1347  01/24/12 0730 01/23/12 0550 01/22/12 0445 01/21/12 0512 01/20/12 1101  WBC 6.6 5.9 6.0 6.8 8.4 --  NEUTROABS -- -- -- -- -- 8.6*  HGB 8.7* 6.5* 7.1* 7.0* 7.5* --  HCT 29.8* 23.6* 25.5* 25.1* 25.8* --  MCV 84.7 85.5 84.7 83.4 81.4 --  PLT 185 177 188 184 201 --   Cardiac Enzymes:  Lab 01/21/12 0512 01/20/12 2126 01/20/12 1352 01/20/12 1101  CKTOTAL 814* 812* 374* --  CKMB 7.7* 9.6* 7.5* --  CKMBINDEX -- -- -- --  TROPONINI <0.30 <0.30 <0.30 <0.30   BNP (last 3 results) No results found for this basename: PROBNP:3 in the last 8760 hours CBG:  Lab 01/24/12 1207 01/24/12 0756 01/24/12 0345 01/23/12 2346 01/23/12 2029  GLUCAP 100* 91 94 92 90    Recent Results (from the past 240 hour(s))  CULTURE, BLOOD (ROUTINE  X 2)     Status: Normal (Preliminary result)   Collection Time   01/20/12  1:50 PM      Component Value Range Status Comment   Specimen Description BLOOD ARM LEFT   Final    Special Requests BOTTLES DRAWN AEROBIC AND ANAEROBIC 10CC   Final    Culture  Setup Time 01/20/2012 23:17   Final    Culture     Final    Value:        BLOOD CULTURE RECEIVED NO GROWTH TO DATE CULTURE WILL BE HELD FOR 5 DAYS BEFORE ISSUING A FINAL NEGATIVE REPORT   Report Status PENDING   Incomplete   CULTURE, BLOOD (ROUTINE X 2)     Status: Normal (Preliminary result)   Collection Time   01/20/12  2:03 PM      Component Value Range Status Comment   Specimen Description BLOOD HAND LEFT   Final    Special Requests BOTTLES DRAWN AEROBIC ONLY 10CC   Final    Culture  Setup Time 01/20/2012 23:18   Final    Culture     Final    Value:        BLOOD CULTURE RECEIVED NO GROWTH TO DATE CULTURE WILL BE HELD FOR 5 DAYS BEFORE ISSUING A FINAL NEGATIVE REPORT   Report Status PENDING   Incomplete   SURGICAL PCR SCREEN     Status: Normal   Collection Time   01/20/12  4:34 PM      Component Value Range Status Comment   MRSA, PCR NEGATIVE  NEGATIVE Final    Staphylococcus aureus NEGATIVE  NEGATIVE Final   URINE CULTURE     Status: Normal   Collection Time   01/20/12  7:15 PM      Component Value Range Status Comment   Specimen Description URINE, CATHETERIZED   Final    Special Requests NONE   Final    Culture  Setup Time 01/20/2012 20:07   Final    Colony Count NO GROWTH   Final    Culture NO GROWTH   Final    Report Status 01/21/2012 FINAL   Final      Studies: Ct Abdomen Pelvis Wo Contrast  01/20/2012  *RADIOLOGY REPORT*  Clinical Data: Nausea and vomiting for 4 days, post fall, abdominal distension, reducible hernia, shock, lactic acidosis  CT ABDOMEN AND PELVIS WITHOUT CONTRAST  Technique:  Multidetector CT imaging of the abdomen and pelvis was performed following the standard protocol without intravenous contrast.   Comparison: Chest radiograph - earlier same day; abdominal radiographic series - 12/09/2006  Findings:  The lack of intravenous  contrast limits the ability to evaluate solid abdominal organs.  Normal hepatic contour. There is an ill defined approximate 1.6 x 2.1 cm hypoattenuating lesion with the dome of the left lobe of the liver (image 90, series 2) which is too small which is incompletely evaluated without intravenous contrast.  Normal noncontrast appearance of the gallbladder.  No ascites.  Normal noncontrast appearance of the bilateral kidneys.  No urinary obstruction.  Note is made of opacities measuring approximately 6 mm (image 62, series two) and 4 mm (image 51) adjacent to the expected location of the right ureter.  These findings are without associated upstream ureterectasis and favored to be within the adjacent right gonadal vein (gonadal veins phleboliths).  The urinary bladder is decompressed with a Foley catheter.  Normal noncontrast appearance of the bilateral adrenal glands and pancreas.  Scattered punctate calcifications with an otherwise normal-appearing spleen, favored to be the sequela of prior granulomas infection.  There are two adjacent anterior ventral wall abdominal hernias, one of which demonstrates a wide neck measuring approximately 8.7 cm in greatest transverse axial dimension (image 59) the other of which is narrow neck measuring approximately 3.5 cm (image 52).  Both of these hernias containing dilated loops of small bowel however do not definitely appear to be a transition point.  Of note, the narrow neck hernia does have a minimal amount of mesenteric stranding about its origin (image 52).  The wide neck hernia does contain a short segment of nondilated transverse colon.  The small bowel is diffusely distended to the level of the cecum. The colon is largely decompressed to the level of the cecum (which is incidentally noted within the midline of the lower abdomen (image 59, series  2). Overall findings are compatible small bowel obstruction, though exact transition point is not identified. Postsurgical change within a loop of small bowel within the anterior aspect of the right mid hemiabdomen (images 37 through 44).  There are multiple scattered foci of air within a dilated loop of small bowel within the right mid hemiabdomen.  No pneumoperitoneum or portal venous gas.  Scattered atherosclerotic calcifications within a mildly tortuous but normal caliber abdominal aorta.  There is a cluster of dilated presumable venous collaterals within the lower abdomen (image 57, series 2) of uncertain etiology or clinical significance.  Scattered colonic diverticulosis without evidence of diverticulitis.  Limited visualization of the lower thorax demonstrates small bilateral pleural effusions, left greater than right. Calcified granuloma within the left lower lobe (image four), the sequela of prior granulomatous infection.  Minimal bibasilar dependent opacities favored to represent atelectasis.  Normal heart size. Small amount of pericardial fluid, presumably physiologic. Extensive calcifications within the mitral valve annulus.  No acute or aggressive osseous abnormalities.  Moderate multilevel lumbar spine degenerative change, worst at L3 - L4, L4 - L5 and L5 - S1.  Bilateral moderate to severe facet degenerative change at L5 - S1.  IMPRESSION:  1. Dilated loops of small bowel concerning for small bowel obstruction, though an exact transition point is not identified presumably secondary to either an adhesion or adjacent ventral wall abdominal hernias. 2.  Scattered foci of nondependent air within dilated loopd of small bowel within the right mid hemiabdomen, while presumably intraluminal, a small amount of pneumatosis is not excluded.  No definite portal venous gas or pneumoperitoneum.  3. Indeterminate 2.1 cm hypoattenuating lesion within the dome of the left lobe the liver, a fully evaluated without  intravenous contrast.  Comparison to prior examinations (if available)  is recommended.  If no comparisons exist, further evaluation with non emergent contrast enhanced CT or MRI may be performed as clinically indicated.  4.  Likely right-sided gonadal vein phleboliths without evidence of urinary obstruction.  5.  Dilated presumably venous collaterals within the inferior aspect of the abdomen of uncertain etiology or clinical significance.  6.  Lumbar spine degenerative change.  Above findings discussed with Dr. Jamesetta So at [phone].  Original Report Authenticated By: Waynard Reeds, M.D.   Ct Head Wo Contrast  01/20/2012  *RADIOLOGY REPORT*  Clinical Data: Fall.  Slurred speech.  Left facial droop.  CT HEAD WITHOUT CONTRAST  Technique:  Contiguous axial images were obtained from the base of the skull through the vertex without contrast.  Comparison: 01/20/2005.  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 Taking this limitation into account, no evidence of intracranial hemorrhage or CT evidence of large acute infarct.  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  Mild global atrophy without hydrocephalus.  No skull fracture.  Vascular calcifications.  Orbital structures appear to be grossly intact.  Visualized sinuses and mastoid air cells are clear.  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  Taking this limitation into account, no evidence of intracranial hemorrhage or CT evidence of large acute infarct.  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  Original Report Authenticated By: Fuller Canada, M.D.   Dg Chest Port 1 View  01/20/2012  *RADIOLOGY REPORT*  Clinical Data: Central line placement, history hypertension  PORTABLE CHEST - 1 VIEW  Comparison: Portable exam 1347 hours compared to 01/20/2012  Findings: Left  jugular line, tip projecting over SVC. Upper normal heart size. Mitral annular calcification noted. Mediastinal contours and pulmonary vascularity normal. Question mild atelectasis at left base. Lungs otherwise clear. No pneumothorax. Bones appear demineralized.  IMPRESSION: No pneumothorax following left jugular line placement. Left basilar atelectasis.  Original Report Authenticated By: Lollie Marrow, M.D.   Dg Chest Portable 1 View  01/20/2012  *RADIOLOGY REPORT*  Clinical Data: Dizziness, hypotension  PORTABLE CHEST - 1 VIEW  Comparison: Portable exam 1052 hours repeated at 1058 hours compared to 12/09/2006  Findings: Rotated to left on both images. Upper normal heart size with mitral annular calcification. Atherosclerotic calcification aorta. Mediastinal contours and pulmonary vascularity normal. Left basilar atelectasis. Lungs otherwise clear. No pleural effusion or pneumothorax. Bones demineralized.  IMPRESSION: Left basilar atelectasis.  Original Report Authenticated By: Lollie Marrow, M.D.   Dg Abd Portable 1v  01/20/2012  *RADIOLOGY REPORT*  Clinical Data: A NG tube placement.  PORTABLE ABDOMEN - 1 VIEW  Comparison: CT of abdomen and pelvis 01/20/2012.  Findings: Tip of nasogastric tube is in the stomach, and the side port is within the proximal stomach.  Multiple dilated loops of gas- filled small bowel are noted, measuring up to 5.4 cm in diameter. The abdomen is incompletely visualized on this single-view examination.  No gross evidence of pneumoperitoneum is identified at this time.  IMPRESSION: 1.  Feeding tube is located within the stomach. 2.  Findings again consistent with small bowel obstruction.  Original Report Authenticated By: Florencia Reasons, M.D.   Dg Abd Portable 2v  01/22/2012  *RADIOLOGY REPORT*  Clinical Data: 66 year old female with bowel obstruction.  Renal failure. Nausea and vomiting.  PORTABLE ABDOMEN - 2 VIEW  Comparison: 01/21/2012 and earlier.  Findings: Portable  upright view of  the abdomen.  Left pleural effusion.  Enteric tube in place, side hole the level of the gastric body.  No pneumoperitoneum.  Continued disproportionate small bowel gas.  Small bowel loops now measure up to 50 mm diameter (unchanged).  Overall, the gas pattern is not significantly changed since 01/20/2012. No acute osseous abnormality identified.  IMPRESSION: 1.  Gas pattern compatible with partial small bowel obstruction and unchanged since 01/20/2012.  No free air. 2.  Left pleural effusion and left lung base pulmonary opacity. 3.  NG tube in place at the level of the stomach.  Original Report Authenticated By: Harley Hallmark, M.D.   Dg Abd Portable 2v  01/21/2012  *RADIOLOGY REPORT*  Clinical Data: Abdominal pain.  Bowel obstruction.  PORTABLE ABDOMEN - 2 VIEW  Comparison: 01/20/2012  Findings: NG tube remains in stable position within the stomach. Decreasing distention of small bowel loops.  No evidence of free air.  No organomegaly.  Degenerative changes in the thoracic spine. No acute bony abnormality.  IMPRESSION: Slight decreased gaseous distention of small bowel.  Original Report Authenticated By: Cyndie Chime, M.D.    Scheduled Meds:    . antiseptic oral rinse  15 mL Mouth Rinse q12n4p  . chlorhexidine  15 mL Mouth Rinse BID  . imipenem-cilastatin  500 mg Intravenous Q6H  . metoprolol  2.5 mg Intravenous Q6H  . morphine   Intravenous Q4H  . pantoprazole (PROTONIX) IV  40 mg Intravenous QHS  . DISCONTD: imipenem-cilastatin  500 mg Intravenous Q8H  . DISCONTD: insulin aspart  0-15 Units Subcutaneous Q4H   Continuous Infusions:    . sodium chloride 125 mL/hr at 01/24/12 1315  . DISCONTD: sodium chloride 100 mL/hr at 01/23/12 1951    Time spent: 35 min    [facility]  [facility] Pager [phone]. If 8PM-8AM, please contact night-coverage at [email or web address], password [facility] 01/24/2012, 10:39 PM  LOS: 4 days

## 2012-01-24 NOTE — Progress Notes (Signed)
ANTIBIOTIC CONSULT NOTE - FOLLOW UP  Pharmacy Consult for Imipenem Indication: Empiric Abdominal Source  Allergies  Allergen Reactions  . Cephalexin Rash  . Penicillins Rash   Admit Complaint: 66 yo F admitted 7/15 with shock (presumed dehyration w/ continued antihypertensives and nausea), lactic acidosis, and ARF. Pharmacy to dose Imipenem.  Assessment: Anticoagulation: DVT Px, SCDs, Consider pharmacological methodology once ABLA stabilizes, for transfuion today  Infectious Disease: Empiric abdominal source, WBC wnl, , afeb  Primaxin 7/15>>  7/15 Blood x 2: NGTD 7/15 Urine: Neg  Cardiovascular: HTN, trop neg (CK elevated), BP up to 148, but mostly at goal, HR 80-90s, start iv metoprolol 2.5 mg iv q6h (on labetolol po PTA - IV started for SCIP) -  Endo: No hx, glucose good on bmet  GI/Nutr: h/o bowel resection; CT w/ SBO and adhesions or hernias; OR 7/17 for exp lap w/ LOA  Neuro: Reducible ventral hernia, aneurysm s/p coil 2006  Renal: ARI - SCr improved   Heme/Onc: H/H/plt ongoing decline, ABLA, for transfusion PTA Meds: Xanax prn, Lotensin, labetalol, Zocor BP: SCDs, MC, ptx iv  Plan:  - Increase Primaxin 500mg  IV change to q6h given ongoing renal function improvement.  Patient Measurements: Height: 5\' 5"  (165.1 cm) Weight: 200 lb 13.4 oz (91.1 kg) IBW/kg (Calculated) : 57   Vital Signs: Temp: 99 F (37.2 C) (07/19 1045) Temp src: Oral (07/19 1045) BP: 132/81 mmHg (07/19 1100) Pulse Rate: 90  (07/19 1100) Intake/Output from previous day: 07/18 0701 - 07/19 0700 In: 2441.5 [I.V.:2231.5; NG/GT:110; IV Piggyback:100] Out: 2340 [Urine:675; Emesis/NG output:1600; Drains:65] Intake/Output from this shift: Total I/O In: 863 [I.V.:160; Blood:703] Out: 10 [Drains:10]  Labs:  [facility] 01/24/12 0730 01/23/12 0550 01/22/12 0445  WBC 5.9 6.0 6.8  HGB 6.5* 7.1* 7.0*  PLT 177 188 184  LABCREA -- -- --  CREATININE 0.84 1.03 1.54*   Estimated Creatinine  Clearance: 76.4 ml/min (by C-G formula based on Cr of 0.84). No results found for this basename: VANCOTROUGH:2,VANCOPEAK:2,VANCORANDOM:2,GENTTROUGH:2,GENTPEAK:2,GENTRANDOM:2,TOBRATROUGH:2,TOBRAPEAK:2,TOBRARND:2,AMIKACINPEAK:2,AMIKACINTROU:2,AMIKACIN:2, in the last 72 hours   Microbiology: Recent Results (from the past 720 hour(s))  CULTURE, BLOOD (ROUTINE X 2)     Status: Normal (Preliminary result)   Collection Time   01/20/12  1:50 PM      Component Value Range Status Comment   Specimen Description BLOOD ARM LEFT   Final    Special Requests BOTTLES DRAWN AEROBIC AND ANAEROBIC 10CC   Final    Culture  Setup Time 01/20/2012 23:17   Final    Culture     Final    Value:        BLOOD CULTURE RECEIVED NO GROWTH TO DATE CULTURE WILL BE HELD FOR 5 DAYS BEFORE ISSUING A FINAL NEGATIVE REPORT   Report Status PENDING   Incomplete   CULTURE, BLOOD (ROUTINE X 2)     Status: Normal (Preliminary result)   Collection Time   01/20/12  2:03 PM      Component Value Range Status Comment   Specimen Description BLOOD HAND LEFT   Final    Special Requests BOTTLES DRAWN AEROBIC ONLY 10CC   Final    Culture  Setup Time 01/20/2012 23:18   Final    Culture     Final    Value:        BLOOD CULTURE RECEIVED NO GROWTH TO DATE CULTURE WILL BE HELD FOR 5 DAYS BEFORE ISSUING A FINAL NEGATIVE REPORT   Report Status PENDING   Incomplete   SURGICAL PCR SCREEN  Status: Normal   Collection Time   01/20/12  4:34 PM      Component Value Range Status Comment   MRSA, PCR NEGATIVE  NEGATIVE Final    Staphylococcus aureus NEGATIVE  NEGATIVE Final   URINE CULTURE     Status: Normal   Collection Time   01/20/12  7:15 PM      Component Value Range Status Comment   Specimen Description URINE, CATHETERIZED   Final    Special Requests NONE   Final    Culture  Setup Time 01/20/2012 20:07   Final    Colony Count NO GROWTH   Final    Culture NO GROWTH   Final    Report Status 01/21/2012 FINAL   Final     Anti-infectives      Start     Dose/Rate Route Frequency Ordered Stop   01/22/12 1400   imipenem-cilastatin (PRIMAXIN) 500 mg in sodium chloride 0.9 % 100 mL IVPB        500 mg 200 mL/hr over 30 Minutes Intravenous 3 times per day 01/22/12 1147     01/20/12 1500   imipenem-cilastatin (PRIMAXIN) 250 mg in sodium chloride 0.9 % 100 mL IVPB  Status:  Discontinued        250 mg 200 mL/hr over 30 Minutes Intravenous Every 12 hours 01/20/12 1400 01/22/12 1147   01/20/12 1400   imipenem-cilastatin (PRIMAXIN) 500 mg in sodium chloride 0.9 % 100 mL IVPB  Status:  Discontinued        500 mg 200 mL/hr over 30 Minutes Intravenous 3 times per day 01/20/12 1331 01/20/12 1358          Thank you for allowing pharmacy to be a part of this patients care team.  Lovenia Kim Pharm.D., BCPS Clinical Pharmacist 01/24/2012 11:39 AM Pager: [phone] Phone: [phone]

## 2012-01-24 NOTE — Progress Notes (Signed)
2 Days Post-Op  Subjective: No flatus/bm, ng output clear, no real complaints  Objective: Vital signs in last 24 hours: Temp:  [98.5 F (36.9 C)-98.9 F (37.2 C)] 98.5 F (36.9 C) (07/18 1600) Pulse Rate:  [89-122] 97  (07/19 0600) Resp:  [9-28] 15  (07/19 0600) BP: (113-151)/(55-121) 125/65 mmHg (07/19 0600) SpO2:  [98 %-100 %] 98 % (07/19 0600) Weight:  [200 lb 13.4 oz (91.1 kg)] 200 lb 13.4 oz (91.1 kg) (07/19 0500) Last BM Date: 01/17/12  Intake/Output from previous day: 07/18 0701 - 07/19 0700 In: 2341.5 [I.V.:2131.5; NG/GT:110; IV Piggyback:100] Out: 2340 [Urine:675; Emesis/NG output:1600; Drains:65] Intake/Output this shift:    General appearance: no distress GI: soft, approp tender, no bs wound clean, drains serosang  Lab Results:   Basename 01/23/12 0550 01/22/12 0445  WBC 6.0 6.8  HGB 7.1* 7.0*  HCT 25.5* 25.1*  PLT 188 184   BMET  Basename 01/23/12 0550 01/22/12 0445  NA 141 140  K 4.0 3.4*  CL 109 107  CO2 22 22  GLUCOSE 106* 93  BUN 18 30*  CREATININE 1.03 1.54*  CALCIUM 8.2* 8.2*   Studies/Results: No results found.  Anti-infectives: Anti-infectives     Start     Dose/Rate Route Frequency Ordered Stop   01/22/12 1400   imipenem-cilastatin (PRIMAXIN) 500 mg in sodium chloride 0.9 % 100 mL IVPB        500 mg 200 mL/hr over 30 Minutes Intravenous 3 times per day 01/22/12 1147     01/20/12 1500   imipenem-cilastatin (PRIMAXIN) 250 mg in sodium chloride 0.9 % 100 mL IVPB  Status:  Discontinued        250 mg 200 mL/hr over 30 Minutes Intravenous Every 12 hours 01/20/12 1400 01/22/12 1147   01/20/12 1400   imipenem-cilastatin (PRIMAXIN) 500 mg in sodium chloride 0.9 % 100 mL IVPB  Status:  Discontinued        500 mg 200 mL/hr over 30 Minutes Intravenous 3 times per day 01/20/12 1331 01/20/12 1358          Assessment/Plan: POD 2 ex lap/loa 1. pca 2. pulm toilet, oob 3. Npo, ngt until bowel function returns 4. scds lovenox    LOS: 4  days    [facility] 01/24/2012

## 2012-01-24 NOTE — Progress Notes (Signed)
CRITICAL VALUE ALERT  Critical value received:  Hbg 6.5  Date of notification:  01/24/12  Time of notification:  0750  Critical value read back:yes  Nurse who received alert:  D. Robb Matar   MD notified (1st page):  A. Rennis Harding  Time of first page:  0800  MD notified (2nd page):  Time of second page:  Responding MD:  A. Rennis Harding  Time MD responded:  [phone]

## 2012-01-25 DIAGNOSIS — K56609 Unspecified intestinal obstruction, unspecified as to partial versus complete obstruction: Secondary | ICD-10-CM

## 2012-01-25 LAB — TYPE AND SCREEN
ABO/RH(D): O NEG
Antibody Screen: NEGATIVE

## 2012-01-25 LAB — BASIC METABOLIC PANEL
BUN: 9 mg/dL (ref 6–23)
Chloride: 107 mEq/L (ref 96–112)
GFR calc Af Amer: >90 mL/min (ref >90)
GFR calc non Af Amer: >90 mL/min (ref >90)
Glucose, Bld: 89 mg/dL (ref 70–99)
Potassium: 3.6 mEq/L (ref 3.5–5.1)
Sodium: 143 mEq/L (ref 135–145)

## 2012-01-25 LAB — CBC
HCT: 30.4 % — ABNORMAL LOW (ref 36.0–46.0)
Hemoglobin: 9 g/dL — ABNORMAL LOW (ref 12.0–15.0)
MCHC: 29.6 g/dL — ABNORMAL LOW (ref 30.0–36.0)
WBC: 6.6 10*3/uL (ref 4.0–10.5)

## 2012-01-25 MED ORDER — MORPHINE SULFATE 2 MG/ML IJ SOLN
1.0000 mg | INTRAMUSCULAR | Status: DC | PRN
  Administered 2012-01-25 – 2012-01-26 (×3): 1 mg via INTRAVENOUS
  Filled 2012-01-25 (×3): qty 1

## 2012-01-25 MED ORDER — WHITE PETROLATUM GEL
Status: AC
  Filled 2012-01-25: qty 5

## 2012-01-25 NOTE — Progress Notes (Signed)
3 Days Post-Op  Subjective: Ng accidentally out last night, no flatus/bm but no n/v  Objective: Vital signs in last 24 hours: Temp:  [97.7 F (36.5 C)-99.3 F (37.4 C)] 99.3 F (37.4 C) (07/20 0446) Pulse Rate:  [80-98] 80  (07/20 0446) Resp:  [11-22] 20  (07/20 0800) BP: (131-179)/(62-139) 162/80 mmHg (07/20 0446) SpO2:  [93 %-99 %] 98 % (07/20 0800) Last BM Date: 01/17/12  Intake/Output from previous day: 07/19 0701 - 07/20 0700 In: 3598.4 [I.V.:2695.4; Blood:703; IV Piggyback:200] Out: 1510 [Urine:1100; Emesis/NG output:320; Drains:90] Intake/Output this shift:    General appearance: no distress GI: approp tender, drains with expected output, few bs, wound clean without infection  Lab Results:   Basename 01/25/12 0500 01/24/12 1347  WBC 6.6 6.6  HGB 9.0* 8.7*  HCT 30.4* 29.8*  PLT 188 185   BMET  Basename 01/25/12 0500 01/24/12 0730  NA 143 146*  K 3.6 3.9  CL 107 113*  CO2 22 23  GLUCOSE 89 105*  BUN 9 12  CREATININE 0.65 0.84  CALCIUM 8.2* 8.5    Assessment/Plan: POD 3 ex lap/loa  1. pca  2. pulm toilet, oob, needs to walk today 3. Npo, will leave ng out as she pulled it and give her trial 4. scds lovenox   LOS: 5 days    [facility] 01/25/2012

## 2012-01-25 NOTE — Progress Notes (Signed)
PATIENT DETAILS Name: [name] [name] Age: 66 y.o. Sex: female Date of Birth: 1945-07-25 Admit Date: 01/20/2012 Admitting Physician Calvert Cantor, MD AVW:[id number] Sherilyn Cooter, MD  Subjective: No major issues overnight-except for some sun downing last pm  Assessment/Plan: Principal Problem:  *Septic shock -2/2 to bowel obstruction/peritonitis -c/w Imipenem for now -resolved  Active Problems: Bowel Obstruction -s/p laparotomy and lysis of adhesions on 7/17 -still NPO -claims no flatus or BM yet -d/c PCA morphine today -will defer post-op management to [facility]  ARF -resolved -2/2 sepsis, poor po intake, hypotension  Anemia due to blood loss, acute  Replaced with 2 units PRBC so far- hgb steady-monitor periodically  HTN -Moderate control -c/w IV Lopressor Q6 hrs   HYPERLIPIDEMIA -resume Statins when able  Disposition: Remain inpatient  DVT Prophylaxis: Prophylactic Lovenox   Code Status: Full code   Procedures:  Laparotomy on 7/17  CONSULTS:  general surgery  PHYSICAL EXAM: Vital signs in last 24 hours: Filed Vitals:   01/24/12 2316 01/25/12 0400 01/25/12 0446 01/25/12 0800  BP:   162/80   Pulse:   80   Temp:   99.3 F (37.4 C)   TempSrc:   Oral   Resp: 18 15 20 20   Height:      Weight:      SpO2: 97% 99% 99% 98%    Weight change:  Body mass index is 33.42 kg/(m^2).   Gen Exam: Awake and alert with clear speech.   Neck: Supple, No JVD.   Chest: B/L Clear.   CVS: S1 S2 Regular, no murmurs.  Abdomen: soft, BS +, non tender, non distended. Incision site looks clean Extremities: no edema, lower extremities warm to touch. Neurologic: Non Focal.   Skin: No Rash.   Wounds: N/A.   Intake/Output from previous day:  Intake/Output Summary (Last 24 hours) at 01/25/12 1113 Last data filed at 01/25/12 0505  Gross per 24 hour  Intake 2435.42 ml  Output   1500 ml  Net 935.42 ml     LAB RESULTS: CBC  Lab 01/25/12 0500 01/24/12 1347 01/24/12  0730 01/23/12 0550 01/22/12 0445 01/20/12 1101  WBC 6.6 6.6 5.9 6.0 6.8 --  HGB 9.0* 8.7* 6.5* 7.1* 7.0* --  HCT 30.4* 29.8* 23.6* 25.5* 25.1* --  PLT 188 185 177 188 184 --  MCV 83.7 84.7 85.5 84.7 83.4 --  MCH 24.8* 24.7* 23.6* 23.6* 23.3* --  MCHC 29.6* 29.2* 27.5* 27.8* 27.9* --  RDW 17.1* 16.8* 18.2* 19.0* 19.2* --  LYMPHSABS -- -- -- -- -- 0.8  MONOABS -- -- -- -- -- 1.2*  EOSABS -- -- -- -- -- 0.0  BASOSABS -- -- -- -- -- 0.0  BANDABS -- -- -- -- -- --    Chemistries   Lab 01/25/12 0500 01/24/12 0730 01/23/12 0550 01/22/12 0445 01/21/12 0512  NA 143 146* 141 140 134*  K 3.6 3.9 4.0 3.4* 3.9  CL 107 113* 109 107 101  CO2 22 23 22 22 19   GLUCOSE 89 105* 106* 93 159*  BUN 9 12 18  30* 42*  CREATININE 0.65 0.84 1.03 1.54* 3.51*  CALCIUM 8.2* 8.5 8.2* 8.2* 7.7*  MG -- -- -- -- 1.8    CBG:  Lab 01/24/12 1207 01/24/12 0756 01/24/12 0345 01/23/12 2346 01/23/12 2029  GLUCAP 100* 91 94 92 90    GFR Estimated Creatinine Clearance: 80.2 ml/min (by C-G formula based on Cr of 0.65).  Coagulation profile No results found for this basename: INR:5,PROTIME:5 in the last  168 hours  Cardiac Enzymes  Lab 01/21/12 0512 01/20/12 2126 01/20/12 1352  CKMB 7.7* 9.6* 7.5*  TROPONINI <0.30 <0.30 <0.30  MYOGLOBIN -- -- --    No components found with this basename: POCBNP:3 No results found for this basename: DDIMER:2 in the last 72 hours No results found for this basename: HGBA1C:2 in the last 72 hours No results found for this basename: CHOL:2,HDL:2,LDLCALC:2,TRIG:2,CHOLHDL:2,LDLDIRECT:2 in the last 72 hours No results found for this basename: TSH,T4TOTAL,FREET3,T3FREE,THYROIDAB in the last 72 hours No results found for this basename: VITAMINB12:2,FOLATE:2,FERRITIN:2,TIBC:2,IRON:2,RETICCTPCT:2 in the last 72 hours No results found for this basename: LIPASE:2,AMYLASE:2 in the last 72 hours  Urine Studies No results found for this basename:  UACOL:2,UAPR:2,USPG:2,UPH:2,UTP:2,UGL:2,UKET:2,UBIL:2,UHGB:2,UNIT:2,UROB:2,ULEU:2,UEPI:2,UWBC:2,URBC:2,UBAC:2,CAST:2,CRYS:2,UCOM:2,BILUA:2 in the last 72 hours  MICROBIOLOGY: Recent Results (from the past 240 hour(s))  CULTURE, BLOOD (ROUTINE X 2)     Status: Normal (Preliminary result)   Collection Time   01/20/12  1:50 PM      Component Value Range Status Comment   Specimen Description BLOOD ARM LEFT   Final    Special Requests BOTTLES DRAWN AEROBIC AND ANAEROBIC 10CC   Final    Culture  Setup Time 01/20/2012 23:17   Final    Culture     Final    Value:        BLOOD CULTURE RECEIVED NO GROWTH TO DATE CULTURE WILL BE HELD FOR 5 DAYS BEFORE ISSUING A FINAL NEGATIVE REPORT   Report Status PENDING   Incomplete   CULTURE, BLOOD (ROUTINE X 2)     Status: Normal (Preliminary result)   Collection Time   01/20/12  2:03 PM      Component Value Range Status Comment   Specimen Description BLOOD HAND LEFT   Final    Special Requests BOTTLES DRAWN AEROBIC ONLY 10CC   Final    Culture  Setup Time 01/20/2012 23:18   Final    Culture     Final    Value:        BLOOD CULTURE RECEIVED NO GROWTH TO DATE CULTURE WILL BE HELD FOR 5 DAYS BEFORE ISSUING A FINAL NEGATIVE REPORT   Report Status PENDING   Incomplete   SURGICAL PCR SCREEN     Status: Normal   Collection Time   01/20/12  4:34 PM      Component Value Range Status Comment   MRSA, PCR NEGATIVE  NEGATIVE Final    Staphylococcus aureus NEGATIVE  NEGATIVE Final   URINE CULTURE     Status: Normal   Collection Time   01/20/12  7:15 PM      Component Value Range Status Comment   Specimen Description URINE, CATHETERIZED   Final    Special Requests NONE   Final    Culture  Setup Time 01/20/2012 20:07   Final    Colony Count NO GROWTH   Final    Culture NO GROWTH   Final    Report Status 01/21/2012 FINAL   Final     RADIOLOGY STUDIES/RESULTS: Ct Abdomen Pelvis Wo Contrast  01/20/2012  *RADIOLOGY REPORT*  Clinical Data: Nausea and vomiting for 4  days, post fall, abdominal distension, reducible hernia, shock, lactic acidosis  CT ABDOMEN AND PELVIS WITHOUT CONTRAST  Technique:  Multidetector CT imaging of the abdomen and pelvis was performed following the standard protocol without intravenous contrast.  Comparison: Chest radiograph - earlier same day; abdominal radiographic series - 12/09/2006  Findings:  The lack of intravenous contrast limits the ability to evaluate solid abdominal organs.  Normal hepatic contour. There is an ill defined approximate 1.6 x 2.1 cm hypoattenuating lesion with the dome of the left lobe of the liver (image 90, series 2) which is too small which is incompletely evaluated without intravenous contrast.  Normal noncontrast appearance of the gallbladder.  No ascites.  Normal noncontrast appearance of the bilateral kidneys.  No urinary obstruction.  Note is made of opacities measuring approximately 6 mm (image 62, series two) and 4 mm (image 51) adjacent to the expected location of the right ureter.  These findings are without associated upstream ureterectasis and favored to be within the adjacent right gonadal vein (gonadal veins phleboliths).  The urinary bladder is decompressed with a Foley catheter.  Normal noncontrast appearance of the bilateral adrenal glands and pancreas.  Scattered punctate calcifications with an otherwise normal-appearing spleen, favored to be the sequela of prior granulomas infection.  There are two adjacent anterior ventral wall abdominal hernias, one of which demonstrates a wide neck measuring approximately 8.7 cm in greatest transverse axial dimension (image 59) the other of which is narrow neck measuring approximately 3.5 cm (image 52).  Both of these hernias containing dilated loops of small bowel however do not definitely appear to be a transition point.  Of note, the narrow neck hernia does have a minimal amount of mesenteric stranding about its origin (image 52).  The wide neck hernia does contain a  short segment of nondilated transverse colon.  The small bowel is diffusely distended to the level of the cecum. The colon is largely decompressed to the level of the cecum (which is incidentally noted within the midline of the lower abdomen (image 59, series 2). Overall findings are compatible small bowel obstruction, though exact transition point is not identified. Postsurgical change within a loop of small bowel within the anterior aspect of the right mid hemiabdomen (images 37 through 44).  There are multiple scattered foci of air within a dilated loop of small bowel within the right mid hemiabdomen.  No pneumoperitoneum or portal venous gas.  Scattered atherosclerotic calcifications within a mildly tortuous but normal caliber abdominal aorta.  There is a cluster of dilated presumable venous collaterals within the lower abdomen (image 57, series 2) of uncertain etiology or clinical significance.  Scattered colonic diverticulosis without evidence of diverticulitis.  Limited visualization of the lower thorax demonstrates small bilateral pleural effusions, left greater than right. Calcified granuloma within the left lower lobe (image four), the sequela of prior granulomatous infection.  Minimal bibasilar dependent opacities favored to represent atelectasis.  Normal heart size. Small amount of pericardial fluid, presumably physiologic. Extensive calcifications within the mitral valve annulus.  No acute or aggressive osseous abnormalities.  Moderate multilevel lumbar spine degenerative change, worst at L3 - L4, L4 - L5 and L5 - S1.  Bilateral moderate to severe facet degenerative change at L5 - S1.  IMPRESSION:  1. Dilated loops of small bowel concerning for small bowel obstruction, though an exact transition point is not identified presumably secondary to either an adhesion or adjacent ventral wall abdominal hernias. 2.  Scattered foci of nondependent air within dilated loopd of small bowel within the right mid  hemiabdomen, while presumably intraluminal, a small amount of pneumatosis is not excluded.  No definite portal venous gas or pneumoperitoneum.  3. Indeterminate 2.1 cm hypoattenuating lesion within the dome of the left lobe the liver, a fully evaluated without intravenous contrast.  Comparison to prior examinations (if available) is recommended.  If no comparisons exist, further evaluation with  non emergent contrast enhanced CT or MRI may be performed as clinically indicated.  4.  Likely right-sided gonadal vein phleboliths without evidence of urinary obstruction.  5.  Dilated presumably venous collaterals within the inferior aspect of the abdomen of uncertain etiology or clinical significance.  6.  Lumbar spine degenerative change.  Above findings discussed with Dr. Jamesetta So at [phone].  Original Report Authenticated By: Waynard Reeds, M.D.   Ct Head Wo Contrast  01/20/2012  *RADIOLOGY REPORT*  Clinical Data: Fall.  Slurred speech.  Left facial droop.  CT HEAD WITHOUT CONTRAST  Technique:  Contiguous axial images were obtained from the base of the skull through the vertex without contrast.  Comparison: 01/20/2005.  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 Taking this limitation into account, no evidence of intracranial hemorrhage or CT evidence of large acute infarct.  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  Mild global atrophy without hydrocephalus.  No skull fracture.  Vascular calcifications.  Orbital structures appear to be grossly intact.  Visualized sinuses and mastoid air cells are clear.  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  Taking this limitation into account, no evidence of intracranial hemorrhage or CT evidence of large acute infarct.  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  Original Report  Authenticated By: Fuller Canada, M.D.   Dg Chest Port 1 View  01/20/2012  *RADIOLOGY REPORT*  Clinical Data: Central line placement, history hypertension  PORTABLE CHEST - 1 VIEW  Comparison: Portable exam 1347 hours compared to 01/20/2012  Findings: Left jugular line, tip projecting over SVC. Upper normal heart size. Mitral annular calcification noted. Mediastinal contours and pulmonary vascularity normal. Question mild atelectasis at left base. Lungs otherwise clear. No pneumothorax. Bones appear demineralized.  IMPRESSION: No pneumothorax following left jugular line placement. Left basilar atelectasis.  Original Report Authenticated By: Lollie Marrow, M.D.   Dg Chest Portable 1 View  01/20/2012  *RADIOLOGY REPORT*  Clinical Data: Dizziness, hypotension  PORTABLE CHEST - 1 VIEW  Comparison: Portable exam 1052 hours repeated at 1058 hours compared to 12/09/2006  Findings: Rotated to left on both images. Upper normal heart size with mitral annular calcification. Atherosclerotic calcification aorta. Mediastinal contours and pulmonary vascularity normal. Left basilar atelectasis. Lungs otherwise clear. No pleural effusion or pneumothorax. Bones demineralized.  IMPRESSION: Left basilar atelectasis.  Original Report Authenticated By: Lollie Marrow, M.D.   Dg Abd Portable 1v  01/20/2012  *RADIOLOGY REPORT*  Clinical Data: A NG tube placement.  PORTABLE ABDOMEN - 1 VIEW  Comparison: CT of abdomen and pelvis 01/20/2012.  Findings: Tip of nasogastric tube is in the stomach, and the side port is within the proximal stomach.  Multiple dilated loops of gas- filled small bowel are noted, measuring up to 5.4 cm in diameter. The abdomen is incompletely visualized on this single-view examination.  No gross evidence of pneumoperitoneum is identified at this time.  IMPRESSION: 1.  Feeding tube is located within the stomach. 2.  Findings again consistent with small bowel obstruction.  Original Report Authenticated By:  Florencia Reasons, M.D.   Dg Abd Portable 2v  01/22/2012  *RADIOLOGY REPORT*  Clinical Data: 66 year old female with bowel obstruction.  Renal failure. Nausea and vomiting.  PORTABLE ABDOMEN - 2 VIEW  Comparison: 01/21/2012 and earlier.  Findings: Portable upright view of the abdomen.  Left pleural effusion.  Enteric tube in  place, side hole the level of the gastric body.  No pneumoperitoneum.  Continued disproportionate small bowel gas.  Small bowel loops now measure up to 50 mm diameter (unchanged).  Overall, the gas pattern is not significantly changed since 01/20/2012. No acute osseous abnormality identified.  IMPRESSION: 1.  Gas pattern compatible with partial small bowel obstruction and unchanged since 01/20/2012.  No free air. 2.  Left pleural effusion and left lung base pulmonary opacity. 3.  NG tube in place at the level of the stomach.  Original Report Authenticated By: Harley Hallmark, M.D.   Dg Abd Portable 2v  01/21/2012  *RADIOLOGY REPORT*  Clinical Data: Abdominal pain.  Bowel obstruction.  PORTABLE ABDOMEN - 2 VIEW  Comparison: 01/20/2012  Findings: NG tube remains in stable position within the stomach. Decreasing distention of small bowel loops.  No evidence of free air.  No organomegaly.  Degenerative changes in the thoracic spine. No acute bony abnormality.  IMPRESSION: Slight decreased gaseous distention of small bowel.  Original Report Authenticated By: Cyndie Chime, M.D.    MEDICATIONS: Scheduled Meds:   . antiseptic oral rinse  15 mL Mouth Rinse q12n4p  . chlorhexidine  15 mL Mouth Rinse BID  . imipenem-cilastatin  500 mg Intravenous Q6H  . metoprolol  5 mg Intravenous Q6H  . morphine   Intravenous Q4H  . pantoprazole (PROTONIX) IV  40 mg Intravenous QHS  . DISCONTD: imipenem-cilastatin  500 mg Intravenous Q8H  . DISCONTD: insulin aspart  0-15 Units Subcutaneous Q4H  . DISCONTD: metoprolol  2.5 mg Intravenous Q6H   Continuous Infusions:   . sodium chloride 125 mL/hr  at 01/25/12 0627  . DISCONTD: sodium chloride 100 mL/hr at 01/23/12 1951   PRN Meds:.diphenhydrAMINE, diphenhydrAMINE, naloxone, ondansetron, sodium chloride  Antibiotics: Anti-infectives     Start     Dose/Rate Route Frequency Ordered Stop   01/24/12 1200   imipenem-cilastatin (PRIMAXIN) 500 mg in sodium chloride 0.9 % 100 mL IVPB        500 mg 200 mL/hr over 30 Minutes Intravenous 4 times per day 01/24/12 1141     01/22/12 1400   imipenem-cilastatin (PRIMAXIN) 500 mg in sodium chloride 0.9 % 100 mL IVPB  Status:  Discontinued        500 mg 200 mL/hr over 30 Minutes Intravenous 3 times per day 01/22/12 1147 01/24/12 1141   01/20/12 1500   imipenem-cilastatin (PRIMAXIN) 250 mg in sodium chloride 0.9 % 100 mL IVPB  Status:  Discontinued        250 mg 200 mL/hr over 30 Minutes Intravenous Every 12 hours 01/20/12 1400 01/22/12 1147   01/20/12 1400   imipenem-cilastatin (PRIMAXIN) 500 mg in sodium chloride 0.9 % 100 mL IVPB  Status:  Discontinued        500 mg 200 mL/hr over 30 Minutes Intravenous 3 times per day 01/20/12 1331 01/20/12 1358           Jeoffrey Massed, MD  [facility] Pager:[phone]  If 7PM-7AM, please contact night-coverage [email or web address] Password [email or web address] 01/25/2012, 11:13 AM   LOS: 5 days

## 2012-01-25 NOTE — Progress Notes (Signed)
Wasted 18 ml of morphine from PCA pump. Wasted medication in sink, observed by RN Radonna Ricker. Saysha Menta Ronette Deter RN/ Valeda Malm

## 2012-01-26 DIAGNOSIS — E785 Hyperlipidemia, unspecified: Secondary | ICD-10-CM

## 2012-01-26 LAB — CBC
HCT: 34.2 % — ABNORMAL LOW (ref 36.0–46.0)
Hemoglobin: 10.4 g/dL — ABNORMAL LOW (ref 12.0–15.0)
MCH: 25.4 pg — ABNORMAL LOW (ref 26.0–34.0)
MCHC: 30.4 g/dL (ref 30.0–36.0)
RBC: 4.1 MIL/uL (ref 3.87–5.11)

## 2012-01-26 LAB — CULTURE, BLOOD (ROUTINE X 2): Culture: NO GROWTH

## 2012-01-26 LAB — CLOSTRIDIUM DIFFICILE BY PCR: Toxigenic C. Difficile by PCR: NEGATIVE

## 2012-01-26 MED ORDER — LABETALOL HCL 100 MG PO TABS
100.0000 mg | ORAL_TABLET | Freq: Two times a day (BID) | ORAL | Status: DC
  Administered 2012-01-26 – 2012-01-28 (×5): 100 mg via ORAL
  Filled 2012-01-26 (×8): qty 1

## 2012-01-26 MED ORDER — SIMVASTATIN 40 MG PO TABS
40.0000 mg | ORAL_TABLET | Freq: Every evening | ORAL | Status: DC
  Administered 2012-01-27: 40 mg via ORAL
  Filled 2012-01-26 (×4): qty 1

## 2012-01-26 MED ORDER — ENOXAPARIN SODIUM 40 MG/0.4ML ~~LOC~~ SOLN
40.0000 mg | SUBCUTANEOUS | Status: DC
  Administered 2012-01-26 – 2012-01-27 (×2): 40 mg via SUBCUTANEOUS
  Filled 2012-01-26 (×3): qty 0.4

## 2012-01-26 MED ORDER — BENAZEPRIL HCL 20 MG PO TABS
20.0000 mg | ORAL_TABLET | Freq: Every day | ORAL | Status: DC
  Administered 2012-01-26 – 2012-01-28 (×3): 20 mg via ORAL
  Filled 2012-01-26 (×4): qty 1

## 2012-01-26 MED ORDER — ALPRAZOLAM 0.25 MG PO TABS: 0.2500 mg | ORAL_TABLET | Freq: Every day | ORAL | Status: DC | PRN

## 2012-01-26 MED ORDER — HYDRALAZINE HCL 20 MG/ML IJ SOLN
10.0000 mg | Freq: Four times a day (QID) | INTRAMUSCULAR | Status: DC | PRN
  Administered 2012-01-26: 10 mg via INTRAVENOUS
  Filled 2012-01-26: qty 0.5

## 2012-01-26 MED ORDER — SACCHAROMYCES BOULARDII 250 MG PO CAPS
250.0000 mg | ORAL_CAPSULE | Freq: Two times a day (BID) | ORAL | Status: DC
  Administered 2012-01-26 – 2012-01-28 (×5): 250 mg via ORAL
  Filled 2012-01-26 (×8): qty 1

## 2012-01-26 NOTE — Progress Notes (Signed)
PATIENT DETAILS Name: [name] [name] Age: 66 y.o. Sex: female Date of Birth: 03/19/1946 Admit Date: 01/20/2012 Admitting Physician Calvert Cantor, MD ZOX:[id number] Sherilyn Cooter, MD  Subjective: No major issues overnight-except for some sun downing last pm  Assessment/Plan: Principal Problem:  *Septic shock -2/2 to bowel obstruction/peritonitis -c/w Imipenem for now-?probably to d/c-will wait for [facility]CS input -resolved  Active Problems: Bowel Obstruction -s/p laparotomy and lysis of adhesions on 7/17 -now having loose stools-will likely start clears today.PCR C Diff neg -NG tube pulled out accidentally 7/20-tolerating it well. -d/c PCA morphine 7/20 -will defer post-op management to [facility]  ARF -resolved -2/2 sepsis, poor po intake, hypotension  Anemia due to blood loss, acute  Replaced with 2 units PRBC so far- hgb steady-monitor periodically  HTN -Moderate control -c/w IV Lopressor Q6 hrs -as needed Hydralazine   HYPERLIPIDEMIA -resume Statins when able  Disposition: Remain inpatient  DVT Prophylaxis: Prophylactic Lovenox   Code Status: Full code   Procedures:  Laparotomy on 7/17  CONSULTS:  general surgery  PHYSICAL EXAM: Vital signs in last 24 hours: Filed Vitals:   01/26/12 0223 01/26/12 0248 01/26/12 0454 01/26/12 0635  BP: 182/98 179/96 182/97 175/98  Pulse: 81  86   Temp: 98.8 F (37.1 C)  98.7 F (37.1 C)   TempSrc: Oral  Oral   Resp: 20  18   Height:      Weight:   95.7 kg (210 lb 15.7 oz)   SpO2: 95%  97%     Weight change:  Body mass index is 35.11 kg/(m^2).   Gen Exam: Awake and alert with clear speech.   Neck: Supple, No JVD.   Chest: B/L Clear.   CVS: S1 S2 Regular, no murmurs.  Abdomen: soft, BS +, non tender, non distended. Incision site looks clean Extremities: no edema, lower extremities warm to touch. Neurologic: Non Focal.   Skin: No Rash.   Wounds: N/A.   Intake/Output from previous day:  Intake/Output Summary (Last  24 hours) at 01/26/12 1020 Last data filed at 01/26/12 0000  Gross per 24 hour  Intake 2219.17 ml  Output     35 ml  Net 2184.17 ml     LAB RESULTS: CBC  Lab 01/26/12 0919 01/25/12 0500 01/24/12 1347 01/24/12 0730 01/23/12 0550 01/20/12 1101  WBC 7.1 6.6 6.6 5.9 6.0 --  HGB 10.4* 9.0* 8.7* 6.5* 7.1* --  HCT 34.2* 30.4* 29.8* 23.6* 25.5* --  PLT 252 188 185 177 188 --  MCV 83.4 83.7 84.7 85.5 84.7 --  MCH 25.4* 24.8* 24.7* 23.6* 23.6* --  MCHC 30.4 29.6* 29.2* 27.5* 27.8* --  RDW 17.7* 17.1* 16.8* 18.2* 19.0* --  LYMPHSABS -- -- -- -- -- 0.8  MONOABS -- -- -- -- -- 1.2*  EOSABS -- -- -- -- -- 0.0  BASOSABS -- -- -- -- -- 0.0  BANDABS -- -- -- -- -- --    Chemistries   Lab 01/25/12 0500 01/24/12 0730 01/23/12 0550 01/22/12 0445 01/21/12 0512  NA 143 146* 141 140 134*  K 3.6 3.9 4.0 3.4* 3.9  CL 107 113* 109 107 101  CO2 22 23 22 22 19   GLUCOSE 89 105* 106* 93 159*  BUN 9 12 18  30* 42*  CREATININE 0.65 0.84 1.03 1.54* 3.51*  CALCIUM 8.2* 8.5 8.2* 8.2* 7.7*  MG -- -- -- -- 1.8    CBG:  Lab 01/24/12 1207 01/24/12 0756 01/24/12 0345 01/23/12 2346 01/23/12 2029  GLUCAP 100* 91 94 92 90  GFR Estimated Creatinine Clearance: 82.4 ml/min (by C-G formula based on Cr of 0.65).  Coagulation profile No results found for this basename: INR:5,PROTIME:5 in the last 168 hours  Cardiac Enzymes  Lab 01/21/12 0512 01/20/12 2126 01/20/12 1352  CKMB 7.7* 9.6* 7.5*  TROPONINI <0.30 <0.30 <0.30  MYOGLOBIN -- -- --    No components found with this basename: POCBNP:3 No results found for this basename: DDIMER:2 in the last 72 hours No results found for this basename: HGBA1C:2 in the last 72 hours No results found for this basename: CHOL:2,HDL:2,LDLCALC:2,TRIG:2,CHOLHDL:2,LDLDIRECT:2 in the last 72 hours No results found for this basename: TSH,T4TOTAL,FREET3,T3FREE,THYROIDAB in the last 72 hours No results found for this basename:  VITAMINB12:2,FOLATE:2,FERRITIN:2,TIBC:2,IRON:2,RETICCTPCT:2 in the last 72 hours No results found for this basename: LIPASE:2,AMYLASE:2 in the last 72 hours  Urine Studies No results found for this basename: UACOL:2,UAPR:2,USPG:2,UPH:2,UTP:2,UGL:2,UKET:2,UBIL:2,UHGB:2,UNIT:2,UROB:2,ULEU:2,UEPI:2,UWBC:2,URBC:2,UBAC:2,CAST:2,CRYS:2,UCOM:2,BILUA:2 in the last 72 hours  MICROBIOLOGY: Recent Results (from the past 240 hour(s))  CULTURE, BLOOD (ROUTINE X 2)     Status: Normal (Preliminary result)   Collection Time   01/20/12  1:50 PM      Component Value Range Status Comment   Specimen Description BLOOD ARM LEFT   Final    Special Requests BOTTLES DRAWN AEROBIC AND ANAEROBIC 10CC   Final    Culture  Setup Time 01/20/2012 23:17   Final    Culture     Final    Value:        BLOOD CULTURE RECEIVED NO GROWTH TO DATE CULTURE WILL BE HELD FOR 5 DAYS BEFORE ISSUING A FINAL NEGATIVE REPORT   Report Status PENDING   Incomplete   CULTURE, BLOOD (ROUTINE X 2)     Status: Normal (Preliminary result)   Collection Time   01/20/12  2:03 PM      Component Value Range Status Comment   Specimen Description BLOOD HAND LEFT   Final    Special Requests BOTTLES DRAWN AEROBIC ONLY 10CC   Final    Culture  Setup Time 01/20/2012 23:18   Final    Culture     Final    Value:        BLOOD CULTURE RECEIVED NO GROWTH TO DATE CULTURE WILL BE HELD FOR 5 DAYS BEFORE ISSUING A FINAL NEGATIVE REPORT   Report Status PENDING   Incomplete   SURGICAL PCR SCREEN     Status: Normal   Collection Time   01/20/12  4:34 PM      Component Value Range Status Comment   MRSA, PCR NEGATIVE  NEGATIVE Final    Staphylococcus aureus NEGATIVE  NEGATIVE Final   URINE CULTURE     Status: Normal   Collection Time   01/20/12  7:15 PM      Component Value Range Status Comment   Specimen Description URINE, CATHETERIZED   Final    Special Requests NONE   Final    Culture  Setup Time 01/20/2012 20:07   Final    Colony Count NO GROWTH   Final     Culture NO GROWTH   Final    Report Status 01/21/2012 FINAL   Final   CLOSTRIDIUM DIFFICILE BY PCR     Status: Normal   Collection Time   01/26/12  8:25 AM      Component Value Range Status Comment   C difficile by pcr NEGATIVE  NEGATIVE Final     RADIOLOGY STUDIES/RESULTS: Ct Abdomen Pelvis Wo Contrast  01/20/2012  *RADIOLOGY REPORT*  Clinical Data: Nausea and vomiting  for 4 days, post fall, abdominal distension, reducible hernia, shock, lactic acidosis  CT ABDOMEN AND PELVIS WITHOUT CONTRAST  Technique:  Multidetector CT imaging of the abdomen and pelvis was performed following the standard protocol without intravenous contrast.  Comparison: Chest radiograph - earlier same day; abdominal radiographic series - 12/09/2006  Findings:  The lack of intravenous contrast limits the ability to evaluate solid abdominal organs.  Normal hepatic contour. There is an ill defined approximate 1.6 x 2.1 cm hypoattenuating lesion with the dome of the left lobe of the liver (image 90, series 2) which is too small which is incompletely evaluated without intravenous contrast.  Normal noncontrast appearance of the gallbladder.  No ascites.  Normal noncontrast appearance of the bilateral kidneys.  No urinary obstruction.  Note is made of opacities measuring approximately 6 mm (image 62, series two) and 4 mm (image 51) adjacent to the expected location of the right ureter.  These findings are without associated upstream ureterectasis and favored to be within the adjacent right gonadal vein (gonadal veins phleboliths).  The urinary bladder is decompressed with a Foley catheter.  Normal noncontrast appearance of the bilateral adrenal glands and pancreas.  Scattered punctate calcifications with an otherwise normal-appearing spleen, favored to be the sequela of prior granulomas infection.  There are two adjacent anterior ventral wall abdominal hernias, one of which demonstrates a wide neck measuring approximately 8.7 cm in  greatest transverse axial dimension (image 59) the other of which is narrow neck measuring approximately 3.5 cm (image 52).  Both of these hernias containing dilated loops of small bowel however do not definitely appear to be a transition point.  Of note, the narrow neck hernia does have a minimal amount of mesenteric stranding about its origin (image 52).  The wide neck hernia does contain a short segment of nondilated transverse colon.  The small bowel is diffusely distended to the level of the cecum. The colon is largely decompressed to the level of the cecum (which is incidentally noted within the midline of the lower abdomen (image 59, series 2). Overall findings are compatible small bowel obstruction, though exact transition point is not identified. Postsurgical change within a loop of small bowel within the anterior aspect of the right mid hemiabdomen (images 37 through 44).  There are multiple scattered foci of air within a dilated loop of small bowel within the right mid hemiabdomen.  No pneumoperitoneum or portal venous gas.  Scattered atherosclerotic calcifications within a mildly tortuous but normal caliber abdominal aorta.  There is a cluster of dilated presumable venous collaterals within the lower abdomen (image 57, series 2) of uncertain etiology or clinical significance.  Scattered colonic diverticulosis without evidence of diverticulitis.  Limited visualization of the lower thorax demonstrates small bilateral pleural effusions, left greater than right. Calcified granuloma within the left lower lobe (image four), the sequela of prior granulomatous infection.  Minimal bibasilar dependent opacities favored to represent atelectasis.  Normal heart size. Small amount of pericardial fluid, presumably physiologic. Extensive calcifications within the mitral valve annulus.  No acute or aggressive osseous abnormalities.  Moderate multilevel lumbar spine degenerative change, worst at L3 - L4, L4 - L5 and L5 -  S1.  Bilateral moderate to severe facet degenerative change at L5 - S1.  IMPRESSION:  1. Dilated loops of small bowel concerning for small bowel obstruction, though an exact transition point is not identified presumably secondary to either an adhesion or adjacent ventral wall abdominal hernias. 2.  Scattered foci of nondependent air  within dilated loopd of small bowel within the right mid hemiabdomen, while presumably intraluminal, a small amount of pneumatosis is not excluded.  No definite portal venous gas or pneumoperitoneum.  3. Indeterminate 2.1 cm hypoattenuating lesion within the dome of the left lobe the liver, a fully evaluated without intravenous contrast.  Comparison to prior examinations (if available) is recommended.  If no comparisons exist, further evaluation with non emergent contrast enhanced CT or MRI may be performed as clinically indicated.  4.  Likely right-sided gonadal vein phleboliths without evidence of urinary obstruction.  5.  Dilated presumably venous collaterals within the inferior aspect of the abdomen of uncertain etiology or clinical significance.  6.  Lumbar spine degenerative change.  Above findings discussed with Dr. Jamesetta So at [phone].  Original Report Authenticated By: Waynard Reeds, M.D.   Ct Head Wo Contrast  01/20/2012  *RADIOLOGY REPORT*  Clinical Data: Fall.  Slurred speech.  Left facial droop.  CT HEAD WITHOUT CONTRAST  Technique:  Contiguous axial images were obtained from the base of the skull through the vertex without contrast.  Comparison: 01/20/2005.  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 Taking this limitation into account, no evidence of intracranial hemorrhage or CT evidence of large acute infarct.  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  Mild global atrophy without hydrocephalus.  No skull fracture.  Vascular calcifications.  Orbital structures appear to  be grossly intact.  Visualized sinuses and mastoid air cells are clear.  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  Taking this limitation into account, no evidence of intracranial hemorrhage or CT evidence of large acute infarct.  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  Original Report Authenticated By: Fuller Canada, M.D.   Dg Chest Port 1 View  01/20/2012  *RADIOLOGY REPORT*  Clinical Data: Central line placement, history hypertension  PORTABLE CHEST - 1 VIEW  Comparison: Portable exam 1347 hours compared to 01/20/2012  Findings: Left jugular line, tip projecting over SVC. Upper normal heart size. Mitral annular calcification noted. Mediastinal contours and pulmonary vascularity normal. Question mild atelectasis at left base. Lungs otherwise clear. No pneumothorax. Bones appear demineralized.  IMPRESSION: No pneumothorax following left jugular line placement. Left basilar atelectasis.  Original Report Authenticated By: Lollie Marrow, M.D.   Dg Chest Portable 1 View  01/20/2012  *RADIOLOGY REPORT*  Clinical Data: Dizziness, hypotension  PORTABLE CHEST - 1 VIEW  Comparison: Portable exam 1052 hours repeated at 1058 hours compared to 12/09/2006  Findings: Rotated to left on both images. Upper normal heart size with mitral annular calcification. Atherosclerotic calcification aorta. Mediastinal contours and pulmonary vascularity normal. Left basilar atelectasis. Lungs otherwise clear. No pleural effusion or pneumothorax. Bones demineralized.  IMPRESSION: Left basilar atelectasis.  Original Report Authenticated By: Lollie Marrow, M.D.   Dg Abd Portable 1v  01/20/2012  *RADIOLOGY REPORT*  Clinical Data: A NG tube placement.  PORTABLE ABDOMEN - 1 VIEW  Comparison: CT of abdomen and pelvis 01/20/2012.  Findings: Tip of nasogastric tube is in the stomach, and the side port is within the proximal stomach.   Multiple dilated loops of gas- filled small bowel are noted, measuring up to 5.4 cm in diameter. The abdomen is incompletely visualized on this single-view examination.  No gross evidence of pneumoperitoneum is identified at this time.  IMPRESSION: 1.  Feeding tube is located within the stomach. 2.  Findings again consistent with small bowel obstruction.  Original Report Authenticated By: Florencia Reasons, M.D.   Dg Abd Portable 2v  01/22/2012  *RADIOLOGY REPORT*  Clinical Data: 66 year old female with bowel obstruction.  Renal failure. Nausea and vomiting.  PORTABLE ABDOMEN - 2 VIEW  Comparison: 01/21/2012 and earlier.  Findings: Portable upright view of the abdomen.  Left pleural effusion.  Enteric tube in place, side hole the level of the gastric body.  No pneumoperitoneum.  Continued disproportionate small bowel gas.  Small bowel loops now measure up to 50 mm diameter (unchanged).  Overall, the gas pattern is not significantly changed since 01/20/2012. No acute osseous abnormality identified.  IMPRESSION: 1.  Gas pattern compatible with partial small bowel obstruction and unchanged since 01/20/2012.  No free air. 2.  Left pleural effusion and left lung base pulmonary opacity. 3.  NG tube in place at the level of the stomach.  Original Report Authenticated By: Harley Hallmark, M.D.   Dg Abd Portable 2v  01/21/2012  *RADIOLOGY REPORT*  Clinical Data: Abdominal pain.  Bowel obstruction.  PORTABLE ABDOMEN - 2 VIEW  Comparison: 01/20/2012  Findings: NG tube remains in stable position within the stomach. Decreasing distention of small bowel loops.  No evidence of free air.  No organomegaly.  Degenerative changes in the thoracic spine. No acute bony abnormality.  IMPRESSION: Slight decreased gaseous distention of small bowel.  Original Report Authenticated By: Cyndie Chime, M.D.    MEDICATIONS: Scheduled Meds:    . antiseptic oral rinse  15 mL Mouth Rinse q12n4p  . chlorhexidine  15 mL Mouth Rinse BID    . enoxaparin (LOVENOX) injection  40 mg Subcutaneous Q24H  . imipenem-cilastatin  500 mg Intravenous Q6H  . metoprolol  5 mg Intravenous Q6H  . pantoprazole (PROTONIX) IV  40 mg Intravenous QHS  . white petrolatum      . DISCONTD: morphine   Intravenous Q4H   Continuous Infusions:    . sodium chloride 100 mL/hr at 01/25/12 2336   PRN Meds:.hydrALAZINE, morphine injection, ondansetron, DISCONTD: diphenhydrAMINE, DISCONTD: diphenhydrAMINE, DISCONTD: naloxone, DISCONTD: sodium chloride  Antibiotics: Anti-infectives     Start     Dose/Rate Route Frequency Ordered Stop   01/24/12 1200   imipenem-cilastatin (PRIMAXIN) 500 mg in sodium chloride 0.9 % 100 mL IVPB        500 mg 200 mL/hr over 30 Minutes Intravenous 4 times per day 01/24/12 1141     01/22/12 1400   imipenem-cilastatin (PRIMAXIN) 500 mg in sodium chloride 0.9 % 100 mL IVPB  Status:  Discontinued        500 mg 200 mL/hr over 30 Minutes Intravenous 3 times per day 01/22/12 1147 01/24/12 1141   01/20/12 1500   imipenem-cilastatin (PRIMAXIN) 250 mg in sodium chloride 0.9 % 100 mL IVPB  Status:  Discontinued        250 mg 200 mL/hr over 30 Minutes Intravenous Every 12 hours 01/20/12 1400 01/22/12 1147   01/20/12 1400   imipenem-cilastatin (PRIMAXIN) 500 mg in sodium chloride 0.9 % 100 mL IVPB  Status:  Discontinued        500 mg 200 mL/hr over 30 Minutes Intravenous 3 times per day 01/20/12 1331 01/20/12 1358           Jeoffrey Massed, MD  [facility] Pager:[phone]  If 7PM-7AM, please contact night-coverage [email or web address] Password [email or web address] 01/26/2012, 10:20 AM   LOS: 6 days

## 2012-01-26 NOTE — Progress Notes (Signed)
Patient ID: [name] [name], female   DOB: Feb 24, 1946, 66 y.o.   MRN: [id number] [facility] Surgery Progress Note:   4 Days Post-Op  Subjective: Mental status is clear.  Sitting up and eating ice.  Passed much flatus last night Objective: Vital signs in last 24 hours: Temp:  [98.6 F (37 C)-99.6 F (37.6 C)] 98.7 F (37.1 C) (07/21 0454) Pulse Rate:  [80-88] 86  (07/21 0454) Resp:  [18-20] 18  (07/21 0454) BP: (166-182)/(77-98) 175/98 mmHg (07/21 0635) SpO2:  [94 %-97 %] 97 % (07/21 0454) Weight:  [210 lb 15.7 oz (95.7 kg)] 210 lb 15.7 oz (95.7 kg) (07/21 0454)  Intake/Output from previous day: 07/20 0701 - 07/21 0700 In: 2219.2 [I.V.:1719.2; IV Piggyback:500] Out: 260 [Emesis/NG output:225; Drains:35] Intake/Output this shift:    Physical Exam: Work of breathing is  Normal.  Abdomen is not painful.   Lab Results:  Results for orders placed during the hospital encounter of 01/20/12 (from the past 48 hour(s))  GLUCOSE, CAPILLARY     Status: Abnormal   Collection Time   01/24/12 12:07 PM      Component Value Range Comment   Glucose-Capillary 100 (*) 70 - 99 mg/dL   CBC     Status: Abnormal   Collection Time   01/24/12  1:47 PM      Component Value Range Comment   WBC 6.6  4.0 - 10.5 K/uL    RBC 3.52 (*) 3.87 - 5.11 MIL/uL    Hemoglobin 8.7 (*) 12.0 - 15.0 g/dL POST TRANSFUSION SPECIMEN   HCT 29.8 (*) 36.0 - 46.0 %    MCV 84.7  78.0 - 100.0 fL    MCH 24.7 (*) 26.0 - 34.0 pg    MCHC 29.2 (*) 30.0 - 36.0 g/dL    RDW 40.9 (*) 81.1 - 15.5 %    Platelets 185  150 - 400 K/uL   CBC     Status: Abnormal   Collection Time   01/25/12  5:00 AM      Component Value Range Comment   WBC 6.6  4.0 - 10.5 K/uL    RBC 3.63 (*) 3.87 - 5.11 MIL/uL    Hemoglobin 9.0 (*) 12.0 - 15.0 g/dL    HCT 91.4 (*) 78.2 - 46.0 %    MCV 83.7  78.0 - 100.0 fL    MCH 24.8 (*) 26.0 - 34.0 pg    MCHC 29.6 (*) 30.0 - 36.0 g/dL    RDW 95.6 (*) 21.3 - 15.5 %    Platelets 188  150 - 400 K/uL   BASIC  METABOLIC PANEL     Status: Abnormal   Collection Time   01/25/12  5:00 AM      Component Value Range Comment   Sodium 143  135 - 145 mEq/L    Potassium 3.6  3.5 - 5.1 mEq/L    Chloride 107  96 - 112 mEq/L    CO2 22  19 - 32 mEq/L    Glucose, Bld 89  70 - 99 mg/dL    BUN 9  6 - 23 mg/dL    Creatinine, Ser 0.86  0.50 - 1.10 mg/dL    Calcium 8.2 (*) 8.4 - 10.5 mg/dL    GFR calc non Af Amer >90  >90 mL/min    GFR calc Af Amer >90  >90 mL/min   CLOSTRIDIUM DIFFICILE BY PCR     Status: Normal   Collection Time   01/26/12  8:25 AM  Component Value Range Comment   C difficile by pcr NEGATIVE  NEGATIVE   CBC     Status: Abnormal   Collection Time   01/26/12  9:19 AM      Component Value Range Comment   WBC 7.1  4.0 - 10.5 K/uL    RBC 4.10  3.87 - 5.11 MIL/uL    Hemoglobin 10.4 (*) 12.0 - 15.0 g/dL    HCT 16.1 (*) 09.6 - 46.0 %    MCV 83.4  78.0 - 100.0 fL    MCH 25.4 (*) 26.0 - 34.0 pg    MCHC 30.4  30.0 - 36.0 g/dL    RDW 04.5 (*) 40.9 - 15.5 %    Platelets 252  150 - 400 K/uL     Radiology/Results: No results found.  Anti-infectives: Anti-infectives     Start     Dose/Rate Route Frequency Ordered Stop   01/24/12 1200   imipenem-cilastatin (PRIMAXIN) 500 mg in sodium chloride 0.9 % 100 mL IVPB        500 mg 200 mL/hr over 30 Minutes Intravenous 4 times per day 01/24/12 1141     01/22/12 1400   imipenem-cilastatin (PRIMAXIN) 500 mg in sodium chloride 0.9 % 100 mL IVPB  Status:  Discontinued        500 mg 200 mL/hr over 30 Minutes Intravenous 3 times per day 01/22/12 1147 01/24/12 1141   01/20/12 1500   imipenem-cilastatin (PRIMAXIN) 250 mg in sodium chloride 0.9 % 100 mL IVPB  Status:  Discontinued        250 mg 200 mL/hr over 30 Minutes Intravenous Every 12 hours 01/20/12 1400 01/22/12 1147   01/20/12 1400   imipenem-cilastatin (PRIMAXIN) 500 mg in sodium chloride 0.9 % 100 mL IVPB  Status:  Discontinued        500 mg 200 mL/hr over 30 Minutes Intravenous 3 times per  day 01/20/12 1331 01/20/12 1358          Assessment/Plan: Problem List: Patient Active Problem List  Diagnosis  . HYPERLIPIDEMIA  . HYPERTENSION  . ANEURYSM, NON-RUPTURED CEREBRAL  . DIVERTICULOSIS OF COLON  . HYPERGLYCEMIA  . CEREBROVASCULAR ACCIDENT, HX OF  . Septic shock  . Acute renal failure  . Small bowel obstruction  . Anemia due to blood loss, acute  . HTN (hypertension)    Begin clear liquid diet. Doing well 4 Days Post-Op    LOS: 6 days   Matt B. Daphine Deutscher, MD, [facility] [phone] beeper [phone]  01/26/2012 10:59 AM

## 2012-01-26 NOTE — Evaluation (Signed)
Physical Therapy Evaluation Patient Details Name: [name] [name] MRN: [id number] DOB: 1945/11/05 Today's Date: 01/26/2012 Time: 4010-2725 PT Time Calculation (min): 13 min  PT Assessment / Plan / Recommendation Clinical Impression  Patient is a 66 yo female s/p laparotomy due to bowel obstruction.  Patient did fairly well with mobility today.  Anticipate she will progress well with increased mobillity.  Do not anticipate any f/u PT or equipment needs.  Will follow acutely for mobility/gait training and education    PT Assessment  Patient needs continued PT services    Follow Up Recommendations  No PT follow up;Supervision/Assistance - 24 hour    Barriers to Discharge None      Equipment Recommendations  None recommended by PT    Recommendations for Other Services     Frequency Min 3X/week    Precautions / Restrictions Precautions Precautions: None Restrictions Weight Bearing Restrictions: No         Mobility  Transfers Transfers: Sit to Stand;Stand to Sit Sit to Stand: 5: Supervision;With upper extremity assist Stand to Sit: 5: Supervision;With upper extremity assist;With armrests;To chair/3-in-1 Details for Transfer Assistance: Cues for safe hand placement for transitions. Ambulation/Gait Ambulation/Gait Assistance: 5: Supervision Ambulation Distance (Feet): 180 Feet Assistive device: None (Occasionally uses rail in hallway) Ambulation/Gait Assistance Details: Slow steady gait.  Shuffling steps at times.  Cues to try to stand tall - flexed trunk due to pain with surgery Gait Pattern: Step-through pattern;Decreased stride length;Shuffle;Trunk flexed    Exercises     PT Diagnosis: Difficulty walking;Generalized weakness;Acute pain  PT Problem List: Decreased strength;Decreased activity tolerance;Decreased mobility;Pain PT Treatment Interventions: Gait training;Functional mobility training;Therapeutic activities;Patient/family education   PT Goals Acute Rehab PT  Goals PT Goal Formulation: With patient Time For Goal Achievement: 02/02/12 Potential to Achieve Goals: Good Pt will Roll Supine to Left Side: Independently PT Goal: Rolling Supine to Left Side - Progress: Goal set today Pt will go Supine/Side to Sit: Independently PT Goal: Supine/Side to Sit - Progress: Goal set today Pt will go Sit to Supine/Side: Independently PT Goal: Sit to Supine/Side - Progress: Goal set today Pt will go Sit to Stand: Independently;with upper extremity assist PT Goal: Sit to Stand - Progress: Goal set today Pt will go Stand to Sit: Independently;with upper extremity assist PT Goal: Stand to Sit - Progress: Goal set today Pt will Ambulate: >150 feet;Independently PT Goal: Ambulate - Progress: Goal set today  Visit Information  Last PT Received On: 01/26/12 Assistance Needed: +1    Subjective Data  Subjective: This was wonderful (walking). 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 Communication Communication: No difficulties    Cognition  Overall Cognitive Status: Appears within functional limits for tasks assessed/performed Arousal/Alertness: Awake/alert Orientation Level: Appears intact for tasks assessed Behavior During Session: [facility] for tasks performed    Extremity/Trunk Assessment Right Upper Extremity  Assessment RUE ROM/Strength/Tone: Within functional levels Left Upper Extremity Assessment LUE ROM/Strength/Tone: Within functional levels Right Lower Extremity Assessment RLE ROM/Strength/Tone: [facility]  for tasks assessed Left Lower Extremity Assessment LLE ROM/Strength/Tone: [facility] for tasks assessed   Balance    End of Session PT - End of Session Equipment Utilized During Treatment: Gait belt (Abdominal binder in place) Activity Tolerance: Patient limited by fatigue Patient left: in chair;with call bell/phone within reach;with family/visitor present Nurse Communication: Mobility status  GP     Vena Austria 01/26/2012, 11:10 AM  Durenda Hurt. Renaldo Fiddler, P [facility] Pager [phone]

## 2012-01-27 MED ORDER — PANTOPRAZOLE SODIUM 40 MG PO TBEC
40.0000 mg | DELAYED_RELEASE_TABLET | Freq: Every day | ORAL | Status: DC
  Administered 2012-01-27 – 2012-01-28 (×2): 40 mg via ORAL
  Filled 2012-01-27 (×2): qty 1

## 2012-01-27 MED ORDER — OXYCODONE-ACETAMINOPHEN 5-325 MG PO TABS: 1.0000 | ORAL_TABLET | ORAL | Status: DC | PRN

## 2012-01-27 NOTE — Progress Notes (Signed)
PATIENT DETAILS Name: [name] [name] Age: 65 y.o. Sex: female Date of Birth: 1946-06-12 Admit Date: 01/20/2012 Admitting Physician Calvert Cantor, MD EAV:[id number] Sherilyn Cooter, MD  Subjective: 3 loose BM's yesterday  Assessment/Plan: Principal Problem:  *Septic shock -2/2 to bowel obstruction/peritonitis -will stop Imipenem today -resolved  Active Problems: Bowel Obstruction -s/p laparotomy and lysis of adhesions on 7/17 -now having loose stools-PCR C Diff neg -diet advanced -NG tube pulled out accidentally 7/20-tolerating it well. -d/c PCA morphine 7/20 -will defer post-op management to [facility]  ARF -resolved -2/2 sepsis, poor po intake, hypotension  Anemia due to blood loss, acute  Replaced with 2 units PRBC so far- hgb steady-monitor periodically  HTN -Moderate control -controlled with Lotensin and Labetolol -as needed Hydralazine   HYPERLIPIDEMIA -resume Statins   Disposition: Remain inpatient  DVT Prophylaxis: Prophylactic Lovenox   Code Status: Full code   Procedures:  Laparotomy on 7/17  CONSULTS:  general surgery  PHYSICAL EXAM: Vital signs in last 24 hours: Filed Vitals:   01/26/12 1500 01/26/12 1616 01/26/12 2100 01/27/12 0523  BP: 128/77 139/80 124/72 124/72  Pulse: 80  101 90  Temp: 98.4 F (36.9 C)  98.8 F (37.1 C) 99 F (37.2 C)  TempSrc: Oral  Oral Oral  Resp: 18  18 18   Height:      Weight:      SpO2: 96%  93% 95%    Weight change:  Body mass index is 35.11 kg/(m^2).   Gen Exam: Awake and alert with clear speech.   Neck: Supple, No JVD.   Chest: B/L Clear.   CVS: S1 S2 Regular, no murmurs.  Abdomen: soft, BS +, non tender, non distended. Incision site looks clean Extremities: no edema, lower extremities warm to touch. Neurologic: Non Focal.   Skin: No Rash.   Wounds: N/A.   Intake/Output from previous day:  Intake/Output Summary (Last 24 hours) at 01/27/12 1227 Last data filed at 01/27/12 0903  Gross per 24 hour    Intake    720 ml  Output    180 ml  Net    540 ml     LAB RESULTS: CBC  Lab 01/26/12 0919 01/25/12 0500 01/24/12 1347 01/24/12 0730 01/23/12 0550  WBC 7.1 6.6 6.6 5.9 6.0  HGB 10.4* 9.0* 8.7* 6.5* 7.1*  HCT 34.2* 30.4* 29.8* 23.6* 25.5*  PLT 252 188 185 177 188  MCV 83.4 83.7 84.7 85.5 84.7  MCH 25.4* 24.8* 24.7* 23.6* 23.6*  MCHC 30.4 29.6* 29.2* 27.5* 27.8*  RDW 17.7* 17.1* 16.8* 18.2* 19.0*  LYMPHSABS -- -- -- -- --  MONOABS -- -- -- -- --  EOSABS -- -- -- -- --  BASOSABS -- -- -- -- --  BANDABS -- -- -- -- --    Chemistries   Lab 01/25/12 0500 01/24/12 0730 01/23/12 0550 01/22/12 0445 01/21/12 0512  NA 143 146* 141 140 134*  K 3.6 3.9 4.0 3.4* 3.9  CL 107 113* 109 107 101  CO2 22 23 22 22 19   GLUCOSE 89 105* 106* 93 159*  BUN 9 12 18  30* 42*  CREATININE 0.65 0.84 1.03 1.54* 3.51*  CALCIUM 8.2* 8.5 8.2* 8.2* 7.7*  MG -- -- -- -- 1.8    CBG:  Lab 01/24/12 1207 01/24/12 0756 01/24/12 0345 01/23/12 2346 01/23/12 2029  GLUCAP 100* 91 94 92 90    GFR Estimated Creatinine Clearance: 82.4 ml/min (by C-G formula based on Cr of 0.65).  Coagulation profile No results found for this basename:  INR:5,PROTIME:5 in the last 168 hours  Cardiac Enzymes  Lab 01/21/12 0512 01/20/12 2126 01/20/12 1352  CKMB 7.7* 9.6* 7.5*  TROPONINI <0.30 <0.30 <0.30  MYOGLOBIN -- -- --    No components found with this basename: POCBNP:3 No results found for this basename: DDIMER:2 in the last 72 hours No results found for this basename: HGBA1C:2 in the last 72 hours No results found for this basename: CHOL:2,HDL:2,LDLCALC:2,TRIG:2,CHOLHDL:2,LDLDIRECT:2 in the last 72 hours No results found for this basename: TSH,T4TOTAL,FREET3,T3FREE,THYROIDAB in the last 72 hours No results found for this basename: VITAMINB12:2,FOLATE:2,FERRITIN:2,TIBC:2,IRON:2,RETICCTPCT:2 in the last 72 hours No results found for this basename: LIPASE:2,AMYLASE:2 in the last 72 hours  Urine Studies No results  found for this basename: UACOL:2,UAPR:2,USPG:2,UPH:2,UTP:2,UGL:2,UKET:2,UBIL:2,UHGB:2,UNIT:2,UROB:2,ULEU:2,UEPI:2,UWBC:2,URBC:2,UBAC:2,CAST:2,CRYS:2,UCOM:2,BILUA:2 in the last 72 hours  MICROBIOLOGY: Recent Results (from the past 240 hour(s))  CULTURE, BLOOD (ROUTINE X 2)     Status: Normal   Collection Time   01/20/12  1:50 PM      Component Value Range Status Comment   Specimen Description BLOOD ARM LEFT   Final    Special Requests BOTTLES DRAWN AEROBIC AND ANAEROBIC 10CC   Final    Culture  Setup Time 01/20/2012 23:17   Final    Culture NO GROWTH 5 DAYS   Final    Report Status 01/26/2012 FINAL   Final   CULTURE, BLOOD (ROUTINE X 2)     Status: Normal   Collection Time   01/20/12  2:03 PM      Component Value Range Status Comment   Specimen Description BLOOD HAND LEFT   Final    Special Requests BOTTLES DRAWN AEROBIC ONLY 10CC   Final    Culture  Setup Time 01/20/2012 23:18   Final    Culture NO GROWTH 5 DAYS   Final    Report Status 01/26/2012 FINAL   Final   SURGICAL PCR SCREEN     Status: Normal   Collection Time   01/20/12  4:34 PM      Component Value Range Status Comment   MRSA, PCR NEGATIVE  NEGATIVE Final    Staphylococcus aureus NEGATIVE  NEGATIVE Final   URINE CULTURE     Status: Normal   Collection Time   01/20/12  7:15 PM      Component Value Range Status Comment   Specimen Description URINE, CATHETERIZED   Final    Special Requests NONE   Final    Culture  Setup Time 01/20/2012 20:07   Final    Colony Count NO GROWTH   Final    Culture NO GROWTH   Final    Report Status 01/21/2012 FINAL   Final   CLOSTRIDIUM DIFFICILE BY PCR     Status: Normal   Collection Time   01/26/12  8:25 AM      Component Value Range Status Comment   C difficile by pcr NEGATIVE  NEGATIVE Final     RADIOLOGY STUDIES/RESULTS: Ct Abdomen Pelvis Wo Contrast  01/20/2012  *RADIOLOGY REPORT*  Clinical Data: Nausea and vomiting for 4 days, post fall, abdominal distension, reducible hernia,  shock, lactic acidosis  CT ABDOMEN AND PELVIS WITHOUT CONTRAST  Technique:  Multidetector CT imaging of the abdomen and pelvis was performed following the standard protocol without intravenous contrast.  Comparison: Chest radiograph - earlier same day; abdominal radiographic series - 12/09/2006  Findings:  The lack of intravenous contrast limits the ability to evaluate solid abdominal organs.  Normal hepatic contour. There is an ill defined approximate 1.6 x 2.1  cm hypoattenuating lesion with the dome of the left lobe of the liver (image 90, series 2) which is too small which is incompletely evaluated without intravenous contrast.  Normal noncontrast appearance of the gallbladder.  No ascites.  Normal noncontrast appearance of the bilateral kidneys.  No urinary obstruction.  Note is made of opacities measuring approximately 6 mm (image 62, series two) and 4 mm (image 51) adjacent to the expected location of the right ureter.  These findings are without associated upstream ureterectasis and favored to be within the adjacent right gonadal vein (gonadal veins phleboliths).  The urinary bladder is decompressed with a Foley catheter.  Normal noncontrast appearance of the bilateral adrenal glands and pancreas.  Scattered punctate calcifications with an otherwise normal-appearing spleen, favored to be the sequela of prior granulomas infection.  There are two adjacent anterior ventral wall abdominal hernias, one of which demonstrates a wide neck measuring approximately 8.7 cm in greatest transverse axial dimension (image 59) the other of which is narrow neck measuring approximately 3.5 cm (image 52).  Both of these hernias containing dilated loops of small bowel however do not definitely appear to be a transition point.  Of note, the narrow neck hernia does have a minimal amount of mesenteric stranding about its origin (image 52).  The wide neck hernia does contain a short segment of nondilated transverse colon.  The small  bowel is diffusely distended to the level of the cecum. The colon is largely decompressed to the level of the cecum (which is incidentally noted within the midline of the lower abdomen (image 59, series 2). Overall findings are compatible small bowel obstruction, though exact transition point is not identified. Postsurgical change within a loop of small bowel within the anterior aspect of the right mid hemiabdomen (images 37 through 44).  There are multiple scattered foci of air within a dilated loop of small bowel within the right mid hemiabdomen.  No pneumoperitoneum or portal venous gas.  Scattered atherosclerotic calcifications within a mildly tortuous but normal caliber abdominal aorta.  There is a cluster of dilated presumable venous collaterals within the lower abdomen (image 57, series 2) of uncertain etiology or clinical significance.  Scattered colonic diverticulosis without evidence of diverticulitis.  Limited visualization of the lower thorax demonstrates small bilateral pleural effusions, left greater than right. Calcified granuloma within the left lower lobe (image four), the sequela of prior granulomatous infection.  Minimal bibasilar dependent opacities favored to represent atelectasis.  Normal heart size. Small amount of pericardial fluid, presumably physiologic. Extensive calcifications within the mitral valve annulus.  No acute or aggressive osseous abnormalities.  Moderate multilevel lumbar spine degenerative change, worst at L3 - L4, L4 - L5 and L5 - S1.  Bilateral moderate to severe facet degenerative change at L5 - S1.  IMPRESSION:  1. Dilated loops of small bowel concerning for small bowel obstruction, though an exact transition point is not identified presumably secondary to either an adhesion or adjacent ventral wall abdominal hernias. 2.  Scattered foci of nondependent air within dilated loopd of small bowel within the right mid hemiabdomen, while presumably intraluminal, a small amount of  pneumatosis is not excluded.  No definite portal venous gas or pneumoperitoneum.  3. Indeterminate 2.1 cm hypoattenuating lesion within the dome of the left lobe the liver, a fully evaluated without intravenous contrast.  Comparison to prior examinations (if available) is recommended.  If no comparisons exist, further evaluation with non emergent contrast enhanced CT or MRI may be performed as clinically  indicated.  4.  Likely right-sided gonadal vein phleboliths without evidence of urinary obstruction.  5.  Dilated presumably venous collaterals within the inferior aspect of the abdomen of uncertain etiology or clinical significance.  6.  Lumbar spine degenerative change.  Above findings discussed with Dr. Jamesetta So at [phone].  Original Report Authenticated By: Waynard Reeds, M.D.   Ct Head Wo Contrast  01/20/2012  *RADIOLOGY REPORT*  Clinical Data: Fall.  Slurred speech.  Left facial droop.  CT HEAD WITHOUT CONTRAST  Technique:  Contiguous axial images were obtained from the base of the skull through the vertex without contrast.  Comparison: 01/20/2005.  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 Taking this limitation into account, no evidence of intracranial hemorrhage or CT evidence of large acute infarct.  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  Mild global atrophy without hydrocephalus.  No skull fracture.  Vascular calcifications.  Orbital structures appear to be grossly intact.  Visualized sinuses and mastoid air cells are clear.  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  Taking this limitation into account, no evidence of intracranial hemorrhage or CT evidence of large acute infarct.  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  Original Report Authenticated By: Fuller Canada, M.D.   Dg Chest Port 1  View  01/20/2012  *RADIOLOGY REPORT*  Clinical Data: Central line placement, history hypertension  PORTABLE CHEST - 1 VIEW  Comparison: Portable exam 1347 hours compared to 01/20/2012  Findings: Left jugular line, tip projecting over SVC. Upper normal heart size. Mitral annular calcification noted. Mediastinal contours and pulmonary vascularity normal. Question mild atelectasis at left base. Lungs otherwise clear. No pneumothorax. Bones appear demineralized.  IMPRESSION: No pneumothorax following left jugular line placement. Left basilar atelectasis.  Original Report Authenticated By: Lollie Marrow, M.D.   Dg Chest Portable 1 View  01/20/2012  *RADIOLOGY REPORT*  Clinical Data: Dizziness, hypotension  PORTABLE CHEST - 1 VIEW  Comparison: Portable exam 1052 hours repeated at 1058 hours compared to 12/09/2006  Findings: Rotated to left on both images. Upper normal heart size with mitral annular calcification. Atherosclerotic calcification aorta. Mediastinal contours and pulmonary vascularity normal. Left basilar atelectasis. Lungs otherwise clear. No pleural effusion or pneumothorax. Bones demineralized.  IMPRESSION: Left basilar atelectasis.  Original Report Authenticated By: Lollie Marrow, M.D.   Dg Abd Portable 1v  01/20/2012  *RADIOLOGY REPORT*  Clinical Data: A NG tube placement.  PORTABLE ABDOMEN - 1 VIEW  Comparison: CT of abdomen and pelvis 01/20/2012.  Findings: Tip of nasogastric tube is in the stomach, and the side port is within the proximal stomach.  Multiple dilated loops of gas- filled small bowel are noted, measuring up to 5.4 cm in diameter. The abdomen is incompletely visualized on this single-view examination.  No gross evidence of pneumoperitoneum is identified at this time.  IMPRESSION: 1.  Feeding tube is located within the stomach. 2.  Findings again consistent with small bowel obstruction.  Original Report Authenticated By: Florencia Reasons, M.D.   Dg Abd Portable 2v  01/22/2012   *RADIOLOGY REPORT*  Clinical Data: 66 year old female with bowel obstruction.  Renal failure. Nausea and vomiting.  PORTABLE ABDOMEN - 2 VIEW  Comparison: 01/21/2012 and earlier.  Findings: Portable upright view of the abdomen.  Left pleural effusion.  Enteric tube in place, side hole the level of the gastric body.  No pneumoperitoneum.  Continued disproportionate small bowel gas.  Small bowel loops now measure up to 50 mm diameter (unchanged).  Overall, the gas pattern is not significantly changed since 01/20/2012. No acute osseous abnormality identified.  IMPRESSION: 1.  Gas pattern compatible with partial small bowel obstruction and unchanged since 01/20/2012.  No free air. 2.  Left pleural effusion and left lung base pulmonary opacity. 3.  NG tube in place at the level of the stomach.  Original Report Authenticated By: Harley Hallmark, M.D.   Dg Abd Portable 2v  01/21/2012  *RADIOLOGY REPORT*  Clinical Data: Abdominal pain.  Bowel obstruction.  PORTABLE ABDOMEN - 2 VIEW  Comparison: 01/20/2012  Findings: NG tube remains in stable position within the stomach. Decreasing distention of small bowel loops.  No evidence of free air.  No organomegaly.  Degenerative changes in the thoracic spine. No acute bony abnormality.  IMPRESSION: Slight decreased gaseous distention of small bowel.  Original Report Authenticated By: Cyndie Chime, M.D.    MEDICATIONS: Scheduled Meds:    . benazepril  20 mg Oral Daily  . enoxaparin (LOVENOX) injection  40 mg Subcutaneous Q24H  . labetalol  100 mg Oral BID  . pantoprazole  40 mg Oral Q1200  . saccharomyces boulardii  250 mg Oral BID  . simvastatin  40 mg Oral QPM  . DISCONTD: antiseptic oral rinse  15 mL Mouth Rinse q12n4p  . DISCONTD: chlorhexidine  15 mL Mouth Rinse BID  . DISCONTD: imipenem-cilastatin  500 mg Intravenous Q6H  . DISCONTD: pantoprazole (PROTONIX) IV  40 mg Intravenous QHS   Continuous Infusions:    . DISCONTD: sodium chloride 50 mL (01/26/12  1128)   PRN Meds:.ALPRAZolam, hydrALAZINE, morphine injection, ondansetron, oxyCODONE-acetaminophen  Antibiotics: Anti-infectives     Start     Dose/Rate Route Frequency Ordered Stop   01/24/12 1200   imipenem-cilastatin (PRIMAXIN) 500 mg in sodium chloride 0.9 % 100 mL IVPB  Status:  Discontinued        500 mg 200 mL/hr over 30 Minutes Intravenous 4 times per day 01/24/12 1141 01/27/12 0958   01/22/12 1400   imipenem-cilastatin (PRIMAXIN) 500 mg in sodium chloride 0.9 % 100 mL IVPB  Status:  Discontinued        500 mg 200 mL/hr over 30 Minutes Intravenous 3 times per day 01/22/12 1147 01/24/12 1141   01/20/12 1500   imipenem-cilastatin (PRIMAXIN) 250 mg in sodium chloride 0.9 % 100 mL IVPB  Status:  Discontinued        250 mg 200 mL/hr over 30 Minutes Intravenous Every 12 hours 01/20/12 1400 01/22/12 1147   01/20/12 1400   imipenem-cilastatin (PRIMAXIN) 500 mg in sodium chloride 0.9 % 100 mL IVPB  Status:  Discontinued        500 mg 200 mL/hr over 30 Minutes Intravenous 3 times per day 01/20/12 1331 01/20/12 1358           Jeoffrey Massed, MD  [facility] Pager:[phone]  If 7PM-7AM, please contact night-coverage [email or web address] Password [facility] 01/27/2012, 12:27 PM   LOS: 7 days

## 2012-01-27 NOTE — Progress Notes (Signed)
5 Days Post-Op  Subjective: Looks great.  Having BM'S.  C Diff negative. Objective: Vital signs in last 24 hours: Temp:  [98.4 F (36.9 C)-99 F (37.2 C)] 99 F (37.2 C) (07/22 0523) Pulse Rate:  [80-113] 90  (07/22 0523) Resp:  [18] 18  (07/22 0523) BP: (124-158)/(72-100) 124/72 mmHg (07/22 0523) SpO2:  [93 %-96 %] 95 % (07/22 0523) Last BM Date: 01/26/12  Intake/Output from previous day: 07/21 0701 - 07/22 0700 In: 580 [P.O.:180; IV Piggyback:400] Out: -  Intake/Output this shift: Total I/O In: -  Out: 70 [Drains:70]  Incision/Wound:clean dry intact soft non tender.  Lab Results:   Basename 01/26/12 0919 01/25/12 0500  WBC 7.1 6.6  HGB 10.4* 9.0*  HCT 34.2* 30.4*  PLT 252 188   BMET  Basename 01/25/12 0500  NA 143  K 3.6  CL 107  CO2 22  GLUCOSE 89  BUN 9  CREATININE 0.65  CALCIUM 8.2*   PT/INR No results found for this basename: LABPROT:2,INR:2 in the last 72 hours ABG No results found for this basename: PHART:2,PCO2:2,PO2:2,HCO3:2 in the last 72 hours  Studies/Results: No results found.  Anti-infectives: Anti-infectives     Start     Dose/Rate Route Frequency Ordered Stop   01/24/12 1200   imipenem-cilastatin (PRIMAXIN) 500 mg in sodium chloride 0.9 % 100 mL IVPB        500 mg 200 mL/hr over 30 Minutes Intravenous 4 times per day 01/24/12 1141     01/22/12 1400   imipenem-cilastatin (PRIMAXIN) 500 mg in sodium chloride 0.9 % 100 mL IVPB  Status:  Discontinued        500 mg 200 mL/hr over 30 Minutes Intravenous 3 times per day 01/22/12 1147 01/24/12 1141   01/20/12 1500   imipenem-cilastatin (PRIMAXIN) 250 mg in sodium chloride 0.9 % 100 mL IVPB  Status:  Discontinued        250 mg 200 mL/hr over 30 Minutes Intravenous Every 12 hours 01/20/12 1400 01/22/12 1147   01/20/12 1400   imipenem-cilastatin (PRIMAXIN) 500 mg in sodium chloride 0.9 % 100 mL IVPB  Status:  Discontinued        500 mg 200 mL/hr over 30 Minutes Intravenous 3 times per day  01/20/12 1331 01/20/12 1358          Assessment/Plan: s/p Procedure(s) (LRB): EXPLORATORY LAPAROTOMY (N/A) Advance diet  LOS: 7 days    [name] [name] A. 01/27/2012

## 2012-01-28 ENCOUNTER — Other Ambulatory Visit: Payer: Self-pay | Admitting: Internal Medicine

## 2012-01-28 MED ORDER — OXYCODONE-ACETAMINOPHEN 5-325 MG PO TABS: 1.0000 | ORAL_TABLET | Freq: Four times a day (QID) | ORAL | Status: AC | PRN

## 2012-01-28 MED ORDER — SACCHAROMYCES BOULARDII 250 MG PO CAPS: 250.0000 mg | ORAL_CAPSULE | Freq: Two times a day (BID) | ORAL | Status: AC

## 2012-01-28 NOTE — Progress Notes (Signed)
Physical Therapy Treatment Patient Details Name: [name] [name] MRN: [id number] DOB: Nov 06, 1945 Today's Date: 01/28/2012 Time: 8295-6213 PT Time Calculation (min): 17 min  PT Assessment / Plan / Recommendation Comments on Treatment Session  Pt s/p SBO and laparotomy who is progressing with mobility but continues to be fatigued with long hall ambulation. Pt encouraged to ambulate 3x/day and continue bil LE HEP. Pt very pleasant and eager to return home.     Follow Up Recommendations       Barriers to Discharge        Equipment Recommendations       Recommendations for Other Services    Frequency     Plan Discharge plan remains appropriate;Frequency remains appropriate    Precautions / Restrictions Precautions Precautions: None   Pertinent Vitals/Pain No pain    Mobility  Bed Mobility Bed Mobility: Not assessed Transfers Transfers: Sit to Stand;Stand to Sit Sit to Stand: 6: Modified independent (Device/Increase time);From chair/3-in-1;From toilet Stand to Sit: To chair/3-in-1;To toilet;6: Modified independent (Device/Increase time) Ambulation/Gait Ambulation/Gait Assistance: 5: Supervision Ambulation Distance (Feet): 300 Feet Assistive device: None Ambulation/Gait Assistance Details: increased sway with gt with steady gait but pt fatigues and 2/4 dyspnea with hall ambulation Gait Pattern: Step-through pattern;Decreased stride length Stairs: Yes Stairs Assistance: 6: Modified independent (Device/Increase time) Stair Management Technique: One rail Right Number of Stairs: 3     Exercises General Exercises - Lower Extremity Long Arc Quad: AROM;Both;Other reps (comment);Seated (25reps) Hip Flexion/Marching: AROM;Both;Other reps (comment);Seated (25reps)   PT Diagnosis:    PT Problem List:   PT Treatment Interventions:     PT Goals Acute Rehab PT Goals PT Goal: Sit to Stand - Progress: Met PT Goal: Stand to Sit - Progress: Met PT Goal: Ambulate - Progress:  Progressing toward goal  Visit Information  Last PT Received On: 01/28/12 Assistance Needed: +1    Subjective Data  Subjective: I'm ready to go home   Cognition  Overall Cognitive Status: Appears within functional limits for tasks assessed/performed Arousal/Alertness: Awake/alert Orientation Level: Appears intact for tasks assessed Behavior During Session: [facility] for tasks performed    Balance     End of Session PT - End of Session Equipment Utilized During Treatment: Other (comment) (abdominal binder) Activity Tolerance: Patient tolerated treatment well Patient left: in chair;with call bell/phone within reach   GP     Delorse Lek 01/28/2012, 9:27 AM Delaney Meigs, PT [phone]

## 2012-01-28 NOTE — Progress Notes (Signed)
Discharge instructions  reviewed with patient  Verbalize and understands. Teaching was done with patient on emptying JP drains verbalize and understands with return demonstration. Staples intact to abdomen with abdominal binder intact. Skin with ecchymotic areas scattered arms , knees, and legs.

## 2012-01-28 NOTE — Discharge Summary (Signed)
PATIENT DETAILS Name: [name] [name] Age: 66 y.o. Sex: female Date of Birth: Feb 16, 1946 MRN: [id number]. Admit Date: 01/20/2012 Admitting Physician: Calvert Cantor, MD WUJ:[id number],NWGNF Sherilyn Cooter, MD  Recommendations for Outpatient Follow-up:  1. [facility] [place] [facility]-will call for followup appointment within a week 2. Followup with primary care practitioner as noted below  PRIMARY DISCHARGE DIAGNOSIS:  Principal Problem:  *Septic shock Active Problems:  HYPERLIPIDEMIA  HYPERTENSION  Acute renal failure  Small bowel obstruction  Anemia due to blood loss, acute  HTN (hypertension)      PAST MEDICAL HISTORY: Past Medical History  Diagnosis Date  . Hypertension   . H/O brain surgery   . Aneurysm of anterior cerebral artery     DISCHARGE MEDICATIONS: Medication List  As of 01/28/2012  9:36 AM   TAKE these medications         ALPRAZolam 0.25 MG tablet   Commonly known as: XANAX   Take 0.25 mg by mouth daily as needed. For anxiety      benazepril 20 MG tablet   Commonly known as: LOTENSIN   Take 20 mg by mouth daily.      labetalol 100 MG tablet   Commonly known as: NORMODYNE   Take 100 mg by mouth 2 (two) times daily.      oxyCODONE-acetaminophen 5-325 MG per tablet   Commonly known as: PERCOCET/ROXICET   Take 1 tablet by mouth every 6 (six) hours as needed.      saccharomyces boulardii 250 MG capsule   Commonly known as: FLORASTOR   Take 1 capsule (250 mg total) by mouth 2 (two) times daily.      simvastatin 40 MG tablet   Commonly known as: ZOCOR   Take 40 mg by mouth every evening.           BRIEF HPI:  See H&P, Labs, Consult and Test reports for all details in brief, patient was admitted for vomiting. Apparently patient was vomiting 4 days prior to admission, on the day of admission her husband found her on the couch after a fall with garbled speech. In the ED patient was found to be hypotensive and in acute renal failure. She was subsequently seen by  pulmonary critical care medicine and admitted to the ICU.  CONSULTATIONS:   pulmonary/intensive care and general surgery  PERTINENT RADIOLOGIC STUDIES: Ct Abdomen Pelvis Wo Contrast  01/20/2012  *RADIOLOGY REPORT*  Clinical Data: Nausea and vomiting for 4 days, post fall, abdominal distension, reducible hernia, shock, lactic acidosis  CT ABDOMEN AND PELVIS WITHOUT CONTRAST  Technique:  Multidetector CT imaging of the abdomen and pelvis was performed following the standard protocol without intravenous contrast.  Comparison: Chest radiograph - earlier same day; abdominal radiographic series - 12/09/2006  Findings:  The lack of intravenous contrast limits the ability to evaluate solid abdominal organs.  Normal hepatic contour. There is an ill defined approximate 1.6 x 2.1 cm hypoattenuating lesion with the dome of the left lobe of the liver (image 90, series 2) which is too small which is incompletely evaluated without intravenous contrast.  Normal noncontrast appearance of the gallbladder.  No ascites.  Normal noncontrast appearance of the bilateral kidneys.  No urinary obstruction.  Note is made of opacities measuring approximately 6 mm (image 62, series two) and 4 mm (image 51) adjacent to the expected location of the right ureter.  These findings are without associated upstream ureterectasis and favored to be within the adjacent right gonadal vein (gonadal veins phleboliths).  The urinary bladder  is decompressed with a Foley catheter.  Normal noncontrast appearance of the bilateral adrenal glands and pancreas.  Scattered punctate calcifications with an otherwise normal-appearing spleen, favored to be the sequela of prior granulomas infection.  There are two adjacent anterior ventral wall abdominal hernias, one of which demonstrates a wide neck measuring approximately 8.7 cm in greatest transverse axial dimension (image 59) the other of which is narrow neck measuring approximately 3.5 cm (image 52).  Both of  these hernias containing dilated loops of small bowel however do not definitely appear to be a transition point.  Of note, the narrow neck hernia does have a minimal amount of mesenteric stranding about its origin (image 52).  The wide neck hernia does contain a short segment of nondilated transverse colon.  The small bowel is diffusely distended to the level of the cecum. The colon is largely decompressed to the level of the cecum (which is incidentally noted within the midline of the lower abdomen (image 59, series 2). Overall findings are compatible small bowel obstruction, though exact transition point is not identified. Postsurgical change within a loop of small bowel within the anterior aspect of the right mid hemiabdomen (images 37 through 44).  There are multiple scattered foci of air within a dilated loop of small bowel within the right mid hemiabdomen.  No pneumoperitoneum or portal venous gas.  Scattered atherosclerotic calcifications within a mildly tortuous but normal caliber abdominal aorta.  There is a cluster of dilated presumable venous collaterals within the lower abdomen (image 57, series 2) of uncertain etiology or clinical significance.  Scattered colonic diverticulosis without evidence of diverticulitis.  Limited visualization of the lower thorax demonstrates small bilateral pleural effusions, left greater than right. Calcified granuloma within the left lower lobe (image four), the sequela of prior granulomatous infection.  Minimal bibasilar dependent opacities favored to represent atelectasis.  Normal heart size. Small amount of pericardial fluid, presumably physiologic. Extensive calcifications within the mitral valve annulus.  No acute or aggressive osseous abnormalities.  Moderate multilevel lumbar spine degenerative change, worst at L3 - L4, L4 - L5 and L5 - S1.  Bilateral moderate to severe facet degenerative change at L5 - S1.  IMPRESSION:  1. Dilated loops of small bowel concerning for  small bowel obstruction, though an exact transition point is not identified presumably secondary to either an adhesion or adjacent ventral wall abdominal hernias. 2.  Scattered foci of nondependent air within dilated loopd of small bowel within the right mid hemiabdomen, while presumably intraluminal, a small amount of pneumatosis is not excluded.  No definite portal venous gas or pneumoperitoneum.  3. Indeterminate 2.1 cm hypoattenuating lesion within the dome of the left lobe the liver, a fully evaluated without intravenous contrast.  Comparison to prior examinations (if available) is recommended.  If no comparisons exist, further evaluation with non emergent contrast enhanced CT or MRI may be performed as clinically indicated.  4.  Likely right-sided gonadal vein phleboliths without evidence of urinary obstruction.  5.  Dilated presumably venous collaterals within the inferior aspect of the abdomen of uncertain etiology or clinical significance.  6.  Lumbar spine degenerative change.  Above findings discussed with Dr. Jamesetta So at [phone].  Original Report Authenticated By: Waynard Reeds, M.D.   Ct Head Wo Contrast  01/20/2012  *RADIOLOGY REPORT*  Clinical Data: Fall.  Slurred speech.  Left facial droop.  CT HEAD WITHOUT CONTRAST  Technique:  Contiguous axial images were obtained from the base of the skull through the  vertex without contrast.  Comparison: 01/20/2005.  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 Taking this limitation into account, no evidence of intracranial hemorrhage or CT evidence of large acute infarct.  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  Mild global atrophy without hydrocephalus.  No skull fracture.  Vascular calcifications.  Orbital structures appear to be grossly intact.  Visualized sinuses and mastoid air cells are clear.  IMPRESSION: Significant streak artifact from coiling material  utilized for treatment of left middle cerebral artery aneurysm.  Taking this limitation into account, no evidence of intracranial hemorrhage or CT evidence of large acute infarct.  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  Original Report Authenticated By: Fuller Canada, M.D.   Dg Chest Port 1 View  01/20/2012  *RADIOLOGY REPORT*  Clinical Data: Central line placement, history hypertension  PORTABLE CHEST - 1 VIEW  Comparison: Portable exam 1347 hours compared to 01/20/2012  Findings: Left jugular line, tip projecting over SVC. Upper normal heart size. Mitral annular calcification noted. Mediastinal contours and pulmonary vascularity normal. Question mild atelectasis at left base. Lungs otherwise clear. No pneumothorax. Bones appear demineralized.  IMPRESSION: No pneumothorax following left jugular line placement. Left basilar atelectasis.  Original Report Authenticated By: Lollie Marrow, M.D.   Dg Chest Portable 1 View  01/20/2012  *RADIOLOGY REPORT*  Clinical Data: Dizziness, hypotension  PORTABLE CHEST - 1 VIEW  Comparison: Portable exam 1052 hours repeated at 1058 hours compared to 12/09/2006  Findings: Rotated to left on both images. Upper normal heart size with mitral annular calcification. Atherosclerotic calcification aorta. Mediastinal contours and pulmonary vascularity normal. Left basilar atelectasis. Lungs otherwise clear. No pleural effusion or pneumothorax. Bones demineralized.  IMPRESSION: Left basilar atelectasis.  Original Report Authenticated By: Lollie Marrow, M.D.   Dg Abd Portable 1v  01/20/2012  *RADIOLOGY REPORT*  Clinical Data: A NG tube placement.  PORTABLE ABDOMEN - 1 VIEW  Comparison: CT of abdomen and pelvis 01/20/2012.  Findings: Tip of nasogastric tube is in the stomach, and the side port is within the proximal stomach.  Multiple dilated loops of gas- filled small bowel are noted, measuring up to 5.4 cm in diameter. The abdomen is incompletely visualized on  this single-view examination.  No gross evidence of pneumoperitoneum is identified at this time.  IMPRESSION: 1.  Feeding tube is located within the stomach. 2.  Findings again consistent with small bowel obstruction.  Original Report Authenticated By: Florencia Reasons, M.D.   Dg Abd Portable 2v  01/22/2012  *RADIOLOGY REPORT*  Clinical Data: 66 year old female with bowel obstruction.  Renal failure. Nausea and vomiting.  PORTABLE ABDOMEN - 2 VIEW  Comparison: 01/21/2012 and earlier.  Findings: Portable upright view of the abdomen.  Left pleural effusion.  Enteric tube in place, side hole the level of the gastric body.  No pneumoperitoneum.  Continued disproportionate small bowel gas.  Small bowel loops now measure up to 50 mm diameter (unchanged).  Overall, the gas pattern is not significantly changed since 01/20/2012. No acute osseous abnormality identified.  IMPRESSION: 1.  Gas pattern compatible with partial small bowel obstruction and unchanged since 01/20/2012.  No free air. 2.  Left pleural effusion and left lung base pulmonary opacity. 3.  NG tube in place at the level of the stomach.  Original Report Authenticated By: Harley Hallmark, M.D.   Dg Abd Portable 2v  01/21/2012  *RADIOLOGY REPORT*  Clinical Data: Abdominal pain.  Bowel obstruction.  PORTABLE ABDOMEN - 2 VIEW  Comparison: 01/20/2012  Findings: NG tube remains in stable position within the stomach. Decreasing distention of small bowel loops.  No evidence of free air.  No organomegaly.  Degenerative changes in the thoracic spine. No acute bony abnormality.  IMPRESSION: Slight decreased gaseous distention of small bowel.  Original Report Authenticated By: Cyndie Chime, M.D.     PERTINENT LAB RESULTS: CBC:  Basename 01/26/12 0919  WBC 7.1  HGB 10.4*  HCT 34.2*  PLT 252   CMET CMP     Component Value Date/Time   NA 143 01/25/2012 0500   K 3.6 01/25/2012 0500   CL 107 01/25/2012 0500   CO2 22 01/25/2012 0500   GLUCOSE 89  01/25/2012 0500   BUN 9 01/25/2012 0500   CREATININE 0.65 01/25/2012 0500   CALCIUM 8.2* 01/25/2012 0500   PROT 8.6* 01/20/2012 1101   ALBUMIN 3.6 01/20/2012 1101   AST 50* 01/20/2012 1101   ALT 21 01/20/2012 1101   ALKPHOS 79 01/20/2012 1101   BILITOT 2.3* 01/20/2012 1101   GFRNONAA >90 01/25/2012 0500   GFRAA >90 01/25/2012 0500    GFR Estimated Creatinine Clearance: 82.4 ml/min (by C-G formula based on Cr of 0.65). No results found for this basename: LIPASE:2,AMYLASE:2 in the last 72 hours No results found for this basename: CKTOTAL:3,CKMB:3,CKMBINDEX:3,TROPONINI:3 in the last 72 hours No components found with this basename: POCBNP:3 No results found for this basename: DDIMER:2 in the last 72 hours No results found for this basename: HGBA1C:2 in the last 72 hours No results found for this basename: CHOL:2,HDL:2,LDLCALC:2,TRIG:2,CHOLHDL:2,LDLDIRECT:2 in the last 72 hours No results found for this basename: TSH,T4TOTAL,FREET3,T3FREE,THYROIDAB in the last 72 hours No results found for this basename: VITAMINB12:2,FOLATE:2,FERRITIN:2,TIBC:2,IRON:2,RETICCTPCT:2 in the last 72 hours Coags: No results found for this basename: PT:2,INR:2 in the last 72 hours Microbiology: Recent Results (from the past 240 hour(s))  CULTURE, BLOOD (ROUTINE X 2)     Status: Normal   Collection Time   01/20/12  1:50 PM      Component Value Range Status Comment   Specimen Description BLOOD ARM LEFT   Final    Special Requests BOTTLES DRAWN AEROBIC AND ANAEROBIC 10CC   Final    Culture  Setup Time 01/20/2012 23:17   Final    Culture NO GROWTH 5 DAYS   Final    Report Status 01/26/2012 FINAL   Final   CULTURE, BLOOD (ROUTINE X 2)     Status: Normal   Collection Time   01/20/12  2:03 PM      Component Value Range Status Comment   Specimen Description BLOOD HAND LEFT   Final    Special Requests BOTTLES DRAWN AEROBIC ONLY 10CC   Final    Culture  Setup Time 01/20/2012 23:18   Final    Culture NO GROWTH 5 DAYS   Final     Report Status 01/26/2012 FINAL   Final   SURGICAL PCR SCREEN     Status: Normal   Collection Time   01/20/12  4:34 PM      Component Value Range Status Comment   MRSA, PCR NEGATIVE  NEGATIVE Final    Staphylococcus aureus NEGATIVE  NEGATIVE Final   URINE CULTURE     Status: Normal   Collection Time   01/20/12  7:15 PM      Component Value Range Status Comment   Specimen Description URINE, CATHETERIZED   Final    Special Requests NONE  Final    Culture  Setup Time 01/20/2012 20:07   Final    Colony Count NO GROWTH   Final    Culture NO GROWTH   Final    Report Status 01/21/2012 FINAL   Final   CLOSTRIDIUM DIFFICILE BY PCR     Status: Normal   Collection Time   01/26/12  8:25 AM      Component Value Range Status Comment   C difficile by pcr NEGATIVE  NEGATIVE Final      BRIEF HOSPITAL COURSE:   Principal Problem:  *Septic shock -This is secondary to bowel obstruction/peritonitis, she was admitted by critical care medicine to the ICU and had evidence of lactic acidosis and acute renal failure on admission. Was given IV fluids and required dopamine as well. Clinical improvement her blood pressure stabilized. Empirically started on imipenem, this is now subsequently been discontinued. She had evidence of bowel obstruction and peritonitis, she was seen by [facility] [place] [facility] and taken for laparotomy. Initially the patient was managed by critical care medicine, however on clinical stability she was transferred to try the hospitalist service.  Active Problems: Bowel obstruction -During her initial stay, [facility] was consulted. -She was subsequently taken for a laparotomy and lysis of adhesions on 01/22/2012. She did require any NG tube during the perioperative period, this was subsequently accidentally put out on 01/25/2012. Her postoperative period was more or less unremarkable, she slowly made improvement, she tolerated removal of the NG tube. She started having bowel  movements, and her diet was slowly advanced. She is now tolerating a regular diet, and has been cleared by surgery to be discharged home later today. [facility], and as a result she'll be discharged with her drain and staples remain in place. She continues to have 3-4 loose stools a day, however she is clinically not dehydrated, C. difficile PCR was negative. Per Dr. Luisa Hart this is not unusual after this type of surgery, I will discharge her on full probiotics and I have recommended that she keep up with the fluids.  Acute renal failure -This is in a setting of septic shock, this resolved with hydration and pressor support.  Anemia This is secondary to 2 but blood loss. -She was transfused 2 units of PRBC this admission -Hemoglobin and hematocrit remained stable  Hypertension -This is controlled -As noted above during her initial hospital stay she was hypotensive from septic shock. Also blood pressure medications were held initially. However they have now been resumed, and the blood pressure remains optimal.  HYPERLIPIDEMIA  -resume Stat[employer]    TODAY-DAY OF DISCHARGE:  Subjective:   Chelsi Warr today has no headache,no chest abdominal pain,no new weakness tingling or numbness, feels much better wants to go home today.   Objective:   Blood pressure 116/70, pulse 79, temperature 98.7 F (37.1 C), temperature source Oral, resp. rate 20, height 5\' 5"  (1.651 m), weight 95.7 kg (210 lb 15.7 oz), SpO2 95.00%.  Intake/Output Summary (Last 24 hours) at 01/28/12 0936 Last data filed at 01/27/12 1905  Gross per 24 hour  Intake    380 ml  Output     60 ml  Net    320 ml    Exam Awake Alert, Oriented *3, No new F.N deficits, Normal affect [place].AT,PERRAL Supple Neck,No JVD, No cervical lymphadenopathy appriciated.  Symmetrical Chest wall movement, Good air movement bilaterally, CTAB RRR,No Gallops,Rubs or new Murmurs, No Parasternal  Heave +ve  B.Sounds, Abd Soft, Non tender, No organomegaly appriciated, No rebound -guarding or rigidity. No Cyanosis, Clubbing or edema, No new Rash or bruise  DISCHARGE CONDITION: Stable  DISPOSITION: HOME  DISCHARGE INSTRUCTIONS:    Activity:  As tolerated with Full fall precautions use walker/cane & assistance as needed  Diet recommendation:  Heart Healthy diet   Follow-up Information    Follow up with Judie Petit, MD. Schedule an appointment as soon as possible for a visit in 1 week.   Contact information:   [address] [place] [address] [phone]       Follow up with CORNETT,THOMAS A., MD. Schedule an appointment as soon as possible for a visit in 7 weeks.   Contact information:   [employer] [place] [address], [address] [place] [address] [phone]         Total Time spent on discharge equals 45 minutes.  SignedJeoffrey Massed 01/28/2012 9:36 AM

## 2012-01-28 NOTE — Progress Notes (Signed)
6 Days Post-Op  Subjective: Feels well. Few loose stools.  Objective: Vital signs in last 24 hours: Temp:  [98 F (36.7 C)-98.8 F (37.1 C)] 98.7 F (37.1 C) (07/23 0538) Pulse Rate:  [79-90] 79  (07/23 0538) Resp:  [18-20] 20  (07/23 0538) BP: (103-116)/(69-70) 116/70 mmHg (07/23 0538) SpO2:  [95 %-99 %] 95 % (07/23 0538) Last BM Date: 01/26/12  Intake/Output from previous day: 07/22 0701 - 07/23 0700 In: 520 [P.O.:520] Out: 130 [Drains:130] Intake/Output this shift:    Incision/Wound:clean  Dry intact.  Drain serosanguinous 150 cc.  Lab Results:   [facility] 01/26/12 0919  WBC 7.1  HGB 10.4*  HCT 34.2*  PLT 252   BMET No results found for this basename: NA:2,K:2,CL:2,CO2:2,GLUCOSE:2,BUN:2,CREATININE:2,CALCIUM:2 in the last 72 hours PT/INR No results found for this basename: LABPROT:2,INR:2 in the last 72 hours ABG No results found for this basename: PHART:2,PCO2:2,PO2:2,HCO3:2 in the last 72 hours  Studies/Results: No results found.  Anti-infectives: Anti-infectives     Start     Dose/Rate Route Frequency Ordered Stop   01/24/12 1200   imipenem-cilastatin (PRIMAXIN) 500 mg in sodium chloride 0.9 % 100 mL IVPB  Status:  Discontinued        500 mg 200 mL/hr over 30 Minutes Intravenous 4 times per day 01/24/12 1141 01/27/12 0958   01/22/12 1400   imipenem-cilastatin (PRIMAXIN) 500 mg in sodium chloride 0.9 % 100 mL IVPB  Status:  Discontinued        500 mg 200 mL/hr over 30 Minutes Intravenous 3 times per day 01/22/12 1147 01/24/12 1141   01/20/12 1500   imipenem-cilastatin (PRIMAXIN) 250 mg in sodium chloride 0.9 % 100 mL IVPB  Status:  Discontinued        250 mg 200 mL/hr over 30 Minutes Intravenous Every 12 hours 01/20/12 1400 01/22/12 1147   01/20/12 1400   imipenem-cilastatin (PRIMAXIN) 500 mg in sodium chloride 0.9 % 100 mL IVPB  Status:  Discontinued        500 mg 200 mL/hr over 30 Minutes Intravenous 3 times per day 01/20/12 1331 01/20/12 1358            Assessment/Plan: s/p Procedure(s) (LRB): EXPLORATORY LAPAROTOMY (N/A) OK for discharge from our standpoint.  Keep staple and drain in for now.  Office will contact her for return visit for staples and drain removal.  LOS: 8 days    Aiden Rao A. 01/28/2012

## 2012-01-29 MED FILL — Sodium Chloride Inj 0.9%: INTRAMUSCULAR | Qty: 10 | Status: AC

## 2012-01-29 MED FILL — Labetalol HCl Tab 100 MG: ORAL | Qty: 1 | Status: AC

## 2012-01-29 MED FILL — Chlorhexidine Gluconate Soln 0.12%: OROMUCOSAL | Qty: 15 | Status: AC

## 2012-01-29 MED FILL — Saccharomyces boulardii Cap 250 MG: ORAL | Qty: 1 | Status: AC

## 2012-01-29 MED FILL — Pantoprazole Sodium For IV Soln 40 MG (Base Equiv): INTRAVENOUS | Qty: 40 | Status: AC

## 2012-01-30 ENCOUNTER — Telehealth: Payer: Self-pay | Admitting: Internal Medicine

## 2012-01-30 ENCOUNTER — Telehealth (INDEPENDENT_AMBULATORY_CARE_PROVIDER_SITE_OTHER): Payer: Self-pay | Admitting: General Surgery

## 2012-01-30 NOTE — Telephone Encounter (Signed)
Per Dr Cato Mulligan, see if Oran Rein can see her late next week

## 2012-01-30 NOTE — Telephone Encounter (Signed)
Pt of Dr. Dwain Sarna calling for post op appt; has been discharged home with two JP drains and instructions for appt in 1 week.  MW on vacation next week.  JP's are still draining quite a lot.  Please call daughter, Idelle Jo with appt.

## 2012-01-30 NOTE — Telephone Encounter (Signed)
Pt called. Was last seen in Nov 2011 for cpx. Pt has been in hosp and had 2 hernia surgeries. Needs to come in for hosp fup to see Dr Cato Mulligan in the next week or two. Pls advise if pt needs 30 min ov or if 15 min ok?

## 2012-01-31 NOTE — Telephone Encounter (Signed)
Let message with husband to call back and schedule

## 2012-02-03 NOTE — Telephone Encounter (Signed)
Pt has an appt with surgeon next Tuesday 02/11/12.  She is going to wait and see what he says and if she needs an appt with Dr Cato Mulligan she will call back

## 2012-02-03 NOTE — Telephone Encounter (Signed)
Spoke to pt's husband last week and left a message with him that his wife needed to set up a hospital fup. Pt husband seemed aggravated by the call. As of today no appt has been scheduled.

## 2012-02-11 ENCOUNTER — Telehealth (INDEPENDENT_AMBULATORY_CARE_PROVIDER_SITE_OTHER): Payer: Self-pay

## 2012-02-11 ENCOUNTER — Encounter (INDEPENDENT_AMBULATORY_CARE_PROVIDER_SITE_OTHER): Payer: Self-pay | Admitting: General Surgery

## 2012-02-11 ENCOUNTER — Ambulatory Visit (INDEPENDENT_AMBULATORY_CARE_PROVIDER_SITE_OTHER): Payer: BC Managed Care – PPO | Admitting: General Surgery

## 2012-02-11 VITALS — BP 120/82 | HR 84 | Temp 97.1°F | Resp 20 | Ht 65.0 in | Wt 184.8 lb

## 2012-02-11 DIAGNOSIS — Z09 Encounter for follow-up examination after completed treatment for conditions other than malignant neoplasm: Secondary | ICD-10-CM

## 2012-02-11 NOTE — Telephone Encounter (Signed)
LMOM for someone in scheduling to call me back so I can schedule an order in epic for bilateral lower venous u/s for this week.

## 2012-02-11 NOTE — Progress Notes (Signed)
Subjective:     Patient ID: [name] [name], female   DOB: [birth year], 66 y.o.   MRN: [id number]  HPI 28 yof who was admitted recently with sepsis and renal failure who was noted to have a bowel obstruction with an incarcerated ventral hernia. I took her to or and did ex lap with lysis of adhesions.  I repaired her primarily with drains.  She did well postop and was discharged home. She is eating now, no n/v and has return of bowel function.  Her drains are putting out less than 30 on left and it is backed out and still over 30 on the right.    Review of Systems     Objective:   Physical Exam Wound with redness around staple sites and drains but no real infection, left drain with some cloudy fluid but no real infection, right side with serous drainage, I removed left drain today and staples    Assessment:     S/p primary ventral hernia repair for bowel obstruction    Plan:     She is doing well and I will see back next week and hopefully remove last remaining drain

## 2012-02-11 NOTE — Telephone Encounter (Signed)
[name] [name] returned my call. We scheduled the pt for bilateral venous u/s to be done 8/8 arrive at 10:15am.

## 2012-02-12 ENCOUNTER — Telehealth (INDEPENDENT_AMBULATORY_CARE_PROVIDER_SITE_OTHER): Payer: Self-pay

## 2012-02-12 DIAGNOSIS — Z9889 Other specified postprocedural states: Secondary | ICD-10-CM

## 2012-02-12 NOTE — Telephone Encounter (Signed)
Pt notified of her appt's I scheduled for her at [facility] for doppler study on 8/8 arrive at 10:15 check in at admitting. I also made an appt for the pt see Dr Cato Mulligan on 02/21/12 arrive at 9:30.

## 2012-02-12 NOTE — Telephone Encounter (Signed)
Called pt's daughter to give her the appt for the doppler study being done at [facility] on 8/8 arrive at 10:15 check in at admitting. I also made an appt for pt to see her PCP Dr Cato Mulligan on 8/16 arrive at 9:30.

## 2012-02-13 ENCOUNTER — Ambulatory Visit (HOSPITAL_COMMUNITY)
Admission: RE | Admit: 2012-02-13 | Discharge: 2012-02-13 | Disposition: A | Discharge status: Home / Self Care | Payer: BC Managed Care – PPO | Source: Ambulatory Visit | Attending: General Surgery | Admitting: General Surgery

## 2012-02-13 DIAGNOSIS — M7989 Other specified soft tissue disorders: Secondary | ICD-10-CM | POA: Insufficient documentation

## 2012-02-13 NOTE — Progress Notes (Signed)
Bilateral:  No evidence of DVT, superficial thrombosis, or Baker's Cyst.   

## 2012-02-21 ENCOUNTER — Ambulatory Visit: Payer: BC Managed Care – PPO | Admitting: Internal Medicine

## 2012-02-21 ENCOUNTER — Ambulatory Visit (INDEPENDENT_AMBULATORY_CARE_PROVIDER_SITE_OTHER): Payer: BC Managed Care – PPO | Admitting: General Surgery

## 2012-02-21 ENCOUNTER — Encounter (INDEPENDENT_AMBULATORY_CARE_PROVIDER_SITE_OTHER): Payer: Self-pay | Admitting: General Surgery

## 2012-02-21 VITALS — BP 104/62 | HR 78 | Resp 16 | Ht 65.0 in | Wt 190.0 lb

## 2012-02-21 DIAGNOSIS — Z09 Encounter for follow-up examination after completed treatment for conditions other than malignant neoplasm: Secondary | ICD-10-CM

## 2012-02-21 NOTE — Progress Notes (Signed)
Subjective:     Patient ID: [name] [name], female   DOB: 1945/11/23, 66 y.o.   MRN: [id number]  HPI This is a 66 year old female who I did an exploratory laparotomy for small bowel obstruction in the hospital. I also did a primary ventral hernia repair. She comes in today doing fairly well. She is having some constipation I recommended she take some Miralax. She is drinking plenty of water. She is slowly increasing her activity. She has one drain remaining that is not putting out a hole not right now and comes in today for removal of this.  Review of Systems     Objective:   Physical Exam Incision healing well without infection. She has one drain with a little bit of serous fluid in it today. Abdomen is otherwise nontender.    Assessment:     Status post laparotomy for small bowel obstruction    Plan:     I removed her last remaining drain today. We put a piece of gauze on this today as well. I told her this would heal over the next several days. She is going to just continue walking. I will plan on seeing her back in a month and increase her activity.

## 2012-03-07 ENCOUNTER — Ambulatory Visit (HOSPITAL_COMMUNITY): Payer: BC Managed Care – PPO

## 2012-03-23 ENCOUNTER — Encounter (INDEPENDENT_AMBULATORY_CARE_PROVIDER_SITE_OTHER): Payer: BC Managed Care – PPO | Admitting: General Surgery

## 2012-04-16 ENCOUNTER — Telehealth (INDEPENDENT_AMBULATORY_CARE_PROVIDER_SITE_OTHER): Payer: Self-pay | Admitting: General Surgery

## 2012-04-16 NOTE — Telephone Encounter (Signed)
LMOM letting pt know that I am having to reschedule her appt w/. Dr. Dwain Sarna from 10/15 to 10/17 at 11:30.  I also sent a reminder card in the mail.

## 2012-04-21 ENCOUNTER — Encounter (INDEPENDENT_AMBULATORY_CARE_PROVIDER_SITE_OTHER): Payer: BC Managed Care – PPO | Admitting: General Surgery

## 2012-04-23 ENCOUNTER — Encounter (INDEPENDENT_AMBULATORY_CARE_PROVIDER_SITE_OTHER): Payer: BC Managed Care – PPO | Admitting: General Surgery

## 2012-05-11 ENCOUNTER — Encounter (INDEPENDENT_AMBULATORY_CARE_PROVIDER_SITE_OTHER): Payer: BC Managed Care – PPO | Admitting: General Surgery

## 2012-06-08 ENCOUNTER — Encounter (INDEPENDENT_AMBULATORY_CARE_PROVIDER_SITE_OTHER): Payer: BC Managed Care – PPO | Admitting: General Surgery

## 2012-07-13 ENCOUNTER — Encounter (INDEPENDENT_AMBULATORY_CARE_PROVIDER_SITE_OTHER): Payer: BC Managed Care – PPO | Admitting: General Surgery

## 2012-07-13 ENCOUNTER — Other Ambulatory Visit: Payer: Self-pay | Admitting: Internal Medicine

## 2012-07-14 ENCOUNTER — Telehealth (INDEPENDENT_AMBULATORY_CARE_PROVIDER_SITE_OTHER): Payer: Self-pay

## 2012-07-14 ENCOUNTER — Other Ambulatory Visit: Payer: Self-pay | Admitting: Internal Medicine

## 2012-07-14 NOTE — Telephone Encounter (Signed)
LMOM giving pt a f/u appt with Dr Dwain Sarna for 2-6 arrive at 9:25am for a 9:40 b/c the pt had to r/s her appt from 1/6. This appt made is the earliest I could schedule for the pt.

## 2012-07-29 ENCOUNTER — Other Ambulatory Visit: Payer: BC Managed Care – PPO

## 2012-08-05 ENCOUNTER — Encounter: Payer: BC Managed Care – PPO | Admitting: Internal Medicine

## 2012-08-13 ENCOUNTER — Encounter (INDEPENDENT_AMBULATORY_CARE_PROVIDER_SITE_OTHER): Payer: BC Managed Care – PPO | Admitting: General Surgery

## 2012-08-14 ENCOUNTER — Telehealth: Payer: Self-pay

## 2012-08-14 ENCOUNTER — Telehealth: Payer: Self-pay | Admitting: Gastroenterology

## 2012-08-14 ENCOUNTER — Telehealth (INDEPENDENT_AMBULATORY_CARE_PROVIDER_SITE_OTHER): Payer: Self-pay

## 2012-08-14 ENCOUNTER — Encounter (INDEPENDENT_AMBULATORY_CARE_PROVIDER_SITE_OTHER): Payer: Self-pay | Admitting: General Surgery

## 2012-08-14 ENCOUNTER — Ambulatory Visit (INDEPENDENT_AMBULATORY_CARE_PROVIDER_SITE_OTHER): Payer: BC Managed Care – PPO | Admitting: General Surgery

## 2012-08-14 VITALS — BP 132/72 | HR 76 | Temp 98.4°F | Resp 18 | Ht 65.0 in | Wt 212.0 lb

## 2012-08-14 DIAGNOSIS — K921 Melena: Secondary | ICD-10-CM

## 2012-08-14 DIAGNOSIS — R109 Unspecified abdominal pain: Secondary | ICD-10-CM

## 2012-08-14 DIAGNOSIS — Z09 Encounter for follow-up examination after completed treatment for conditions other than malignant neoplasm: Secondary | ICD-10-CM

## 2012-08-14 NOTE — Progress Notes (Signed)
Subjective:     Patient ID: [name] [name], female   DOB: 11-04-1945, 67 y.o.   MRN: [id number]  HPI This is a 67 year old female who underwent exploratory laparotomy for small bowel obstruction with lysis of adhesions and primary repair of a ventral hernia. She has done well postoperatively. Her drains have all been removed. Today though she returns with over the last month history of bright red blood per rectum. She denies any perianal pain. Her last colonoscopy was 7-8 years ago. She also complains of some bloating and some lumps in her upper abdomen near scar. She is otherwise in pretty well. She has some occasional nausea.  Review of Systems     Objective:   Physical Exam Abdomen is obese, well-healed incision, I cannot feel any discrete hernias but she has significant scar tissue it is difficult to examine, nontender    Assessment:     Status post laparotomy for small bowel  Obstruction Abdominal pain possible recurrent hernia Blood per rectum    Plan:     It is difficult to tell since she has a recurrent hernia but she is certainly high risk. I discussed repeating a CT scan to see if this is the case. I am also going to send her to get a colonoscopy by Dr. Christella Hartigan due to bright red blood. I will plan on following up after that.

## 2012-08-14 NOTE — Telephone Encounter (Signed)
Pt scheduled 08/18/12 10 am [name] [name] to notify pt

## 2012-08-14 NOTE — Telephone Encounter (Signed)
[name] [name] is aware of the appt for 08/18/12 10 am

## 2012-08-14 NOTE — Telephone Encounter (Signed)
Matt, we'll get her in. Thanks Amyria Komar, She is being referred by Dr. Dwain Sarna for rectal bleeding. Can you contact his office to help with appt, etc. thanks ----- Message ----- From: Emelia Loron, MD Sent: 08/14/2012 2:53 PM To: Rachael Fee, MD, Lindley Magnus, MD

## 2012-08-14 NOTE — Telephone Encounter (Signed)
LMOM for pt to call me so I can give her the appt with Dr Christella Hartigan for 08/18/12 arrive at 9:45/10:00.

## 2012-08-18 ENCOUNTER — Ambulatory Visit: Payer: BC Managed Care – PPO | Admitting: Gastroenterology

## 2012-08-19 ENCOUNTER — Other Ambulatory Visit: Payer: BC Managed Care – PPO

## 2012-08-24 ENCOUNTER — Ambulatory Visit
Admission: RE | Admit: 2012-08-24 | Discharge: 2012-08-24 | Disposition: A | Discharge status: Home / Self Care | Payer: BC Managed Care – PPO | Source: Ambulatory Visit | Attending: General Surgery | Admitting: General Surgery

## 2012-08-24 DIAGNOSIS — R109 Unspecified abdominal pain: Secondary | ICD-10-CM

## 2012-08-24 MED ORDER — IOHEXOL 300 MG/ML  SOLN
100.0000 mL | Freq: Once | INTRAMUSCULAR | Status: AC | PRN
  Administered 2012-08-24: 125 mL via INTRAVENOUS

## 2012-08-28 ENCOUNTER — Encounter: Payer: Self-pay | Admitting: Gastroenterology

## 2012-08-28 ENCOUNTER — Telehealth (INDEPENDENT_AMBULATORY_CARE_PROVIDER_SITE_OTHER): Payer: Self-pay

## 2012-08-28 DIAGNOSIS — M549 Dorsalgia, unspecified: Secondary | ICD-10-CM

## 2012-08-28 NOTE — Telephone Encounter (Signed)
Called to refer pt to see Dr Cato Mulligan.

## 2012-08-28 NOTE — Telephone Encounter (Signed)
Called pt back to let her know that I did confirm with Dr Dwain Sarna about the results and surgery. Per Dr Dwain Sarna he said back in July when he spoke to pt's husband and daughter he explained that you would have this hernia to come back but didn't know when it would come back. Dr Dwain Sarna would not do any surgery until we know for sure you have a recurrent hernia that is why we did a CT scan on you to see what was going on when you cam in here for the office visit c/o pain. The CT scan does not show any recurrent hernia just fat so that is why Dr Dwain Sarna said he would do nothing at this time. The pt understands now and she knows to call if any changes occur.

## 2012-08-28 NOTE — Telephone Encounter (Signed)
Called pt to give her results of CT scan but the husband took the message. I notified him that the CT scan did not show a recurrent ventral hernia at the umbilical area just fat. The CT scan did show a small ventral hernia in the upper abdomen that just contains fat nothing to worry about per Dr Dwain Sarna. Dr Dwain Sarna said he would do nothing at this time as far as surgery. I did give an appt for the pt to go see her GI Dr Christella Hartigan for 09/11/12 arrive 2:15/2:30. The husband started telling me the pt was really bad off with pain all the time in her back, bloating, and legs swelling. I advised husband that the hernia would not cause of any those symptoms and she really needed to see her medical doctor. The husband said they have had several appt with Dr Cato Mulligan but they cancel the appt's. I told the husband I would call Dr Cato Mulligan office to make the pt an appt and let Dr Dwain Sarna know what's going on. I will call him back with an appt.

## 2012-08-28 NOTE — Telephone Encounter (Signed)
Called pt back to let her know that I did make an appt for pt to see Dr Cato Mulligan on 09/01/12 arrive at 8:15/8:30. The pt understands the appt. The husband got back on the phone to ask me about the clarification of the CT scan b/c they were under the understanding back in July 2013 that the pt would have hernia surgery again by Dr Dwain Sarna. They are confused since I told them there is no recurrent hernia to fix and there is a new small ventral hernia that we would do nothing with either per Dr Dwain Sarna. I told the husband I would check on the clarification and get back in touch with him.

## 2012-09-01 ENCOUNTER — Ambulatory Visit (INDEPENDENT_AMBULATORY_CARE_PROVIDER_SITE_OTHER): Payer: BC Managed Care – PPO | Admitting: Internal Medicine

## 2012-09-01 ENCOUNTER — Encounter: Payer: Self-pay | Admitting: Internal Medicine

## 2012-09-01 VITALS — BP 132/80 | HR 88 | Temp 98.6°F | Wt 206.0 lb

## 2012-09-01 DIAGNOSIS — Z8679 Personal history of other diseases of the circulatory system: Secondary | ICD-10-CM

## 2012-09-01 DIAGNOSIS — R7309 Other abnormal glucose: Secondary | ICD-10-CM

## 2012-09-01 DIAGNOSIS — M549 Dorsalgia, unspecified: Secondary | ICD-10-CM

## 2012-09-01 DIAGNOSIS — I10 Essential (primary) hypertension: Secondary | ICD-10-CM

## 2012-09-01 DIAGNOSIS — E785 Hyperlipidemia, unspecified: Secondary | ICD-10-CM

## 2012-09-01 DIAGNOSIS — D62 Acute posthemorrhagic anemia: Secondary | ICD-10-CM

## 2012-09-01 LAB — BASIC METABOLIC PANEL
CO2: 23 mEq/L (ref 19–32)
Calcium: 8.1 mg/dL — ABNORMAL LOW (ref 8.4–10.5)
GFR: 72.42 mL/min (ref >60.00)
Glucose, Bld: 123 mg/dL — ABNORMAL HIGH (ref 70–99)
Potassium: 4.2 mEq/L (ref 3.5–5.1)
Sodium: 136 mEq/L (ref 135–145)

## 2012-09-01 LAB — CBC WITH DIFFERENTIAL/PLATELET
Basophils Absolute: 0 10*3/uL (ref 0.0–0.1)
Eosinophils Relative: 3.4 % (ref 0.0–5.0)
Monocytes Absolute: 0.5 10*3/uL (ref 0.1–1.0)
Monocytes Relative: 10.1 % (ref 3.0–12.0)
Neutrophils Relative %: 71.3 % (ref 43.0–77.0)
Platelets: 89 10*3/uL — ABNORMAL LOW (ref 150.0–400.0)
RDW: 16.9 % — ABNORMAL HIGH (ref 11.5–14.6)
WBC: 4.8 10*3/uL (ref 4.5–10.5)

## 2012-09-01 LAB — LIPID PANEL
HDL: 24.1 mg/dL — ABNORMAL LOW (ref >39.00)
Total CHOL/HDL Ratio: 4
VLDL: 13.8 mg/dL (ref 0.0–40.0)

## 2012-09-01 LAB — HEPATIC FUNCTION PANEL
AST: 177 U/L — ABNORMAL HIGH (ref 0–37)
Albumin: 2.7 g/dL — ABNORMAL LOW (ref 3.5–5.2)
Alkaline Phosphatase: 176 U/L — ABNORMAL HIGH (ref 39–117)
Bilirubin, Direct: 0.6 mg/dL — ABNORMAL HIGH (ref 0.0–0.3)

## 2012-09-01 LAB — SEDIMENTATION RATE: Sed Rate: 81 mm/hr — ABNORMAL HIGH (ref 0–22)

## 2012-09-01 NOTE — Progress Notes (Signed)
Patient ID: [name] [name], female   DOB: 08-27-1945, 67 y.o.   MRN: [id number]  Rectal Bleeding-- she has appointment with Dr. Christella Hartigan. She also has noted diarrhea intermittently and reports that her sister has some sort of colitis.  htn-- she says home bps are great (<130/85)  She had an incarcerated hernia which required emergent surgery. She has had followup with surgery. She has recovered well. She has not started exercising. She admits to following a poor diet.  Back pain: Patient states she has had back pain since surgery. She states the back pain is worse when she is sitting. When she gets up and active the back pain resolves.  Hyperlipidemia: Patient needs followup.  Mood disorder: Patient is followed by psychiatry and currently takes Prozac. The patient admits to some depression because she has been ill over the past 6 months.  Past Medical History  Diagnosis Date  . Hypertension   . H/O brain surgery   . Aneurysm of anterior cerebral artery     History   Social History  . Marital Status: Married    Spouse Name: N/A    Number of Children: N/A  . Years of Education: N/A   Occupational History  . Not on file.   Social History Main Topics  . Smoking status: Never Smoker   . Smokeless tobacco: Not on file  . Alcohol Use: Yes  . Drug Use: No  . Sexually Active: Not on file   Other Topics Concern  . Not on file   Social History Narrative  . No narrative on file    Past Surgical History  Procedure Laterality Date  . Laparotomy  01/22/2012    Procedure: EXPLORATORY LAPAROTOMY;  Surgeon: Emelia Loron, MD;  Location: [facility] OR;  Service: General;  Laterality: N/A;  lysis of adhesions    Family History  Problem Relation Age of Onset  . Cancer Father     pancreatic  . Cancer Maternal Grandmother     breast    Allergies  Allergen Reactions  . Cephalexin Rash  . Penicillins Rash    Current Outpatient Prescriptions on File Prior to Visit  Medication Sig  Dispense Refill  . ALPRAZolam (XANAX) 0.25 MG tablet Take 0.25 mg by mouth daily as needed. For anxiety      . benazepril (LOTENSIN) 20 MG tablet TAKE 1 TABLET ONCE DAILY.  30 tablet  1  . labetalol (NORMODYNE) 100 MG tablet TAKE 1 TABLET TWICE DAILY.  60 tablet  1  . ZOCOR 40 MG tablet TAKE 1 TABLET ONCE DAILY.  90 each  3   No current facility-administered medications on file prior to visit.     patient denies chest pain, shortness of breath, orthopnea. Denies lower extremity edema, abdominal pain, change in appetite, change in bowel movements. Patient denies rashes, musculoskeletal complaints. No other specific complaints in a complete review of systems.   BP 132/80  Pulse 88  Temp(Src) 98.6 F (37 C) (Oral)  Wt 206 lb (93.441 kg)  BMI 34.28 kg/m2 Overweight female in no acute distress. HEENT exam: Atraumatic, normocephalic. Neck is supple. Chest clear to auscultation cardiac exam S1-S2 are regular. Abdominal exam overweight, active bowel sounds, soft. Extremities trace edema at the ankles only. Neurologic exam she is alert and has a normal gait.

## 2012-09-02 NOTE — Assessment & Plan Note (Signed)
BP Readings from Last 3 Encounters:  09/01/12 132/80  08/14/12 132/72  02/21/12 104/62   Blood pressures are adequately controlled. Continue current medications. She desperately needs to lose weight. I've encouraged her to gradually increase exercise. She should also limit calorie intake. I've reviewed weights. Note 50 pound weight gain.

## 2012-09-02 NOTE — Assessment & Plan Note (Signed)
I suspect this is resolved. We'll check labs today.

## 2012-09-02 NOTE — Assessment & Plan Note (Signed)
Continue current medications. Will check laboratory work today.

## 2012-09-08 NOTE — Progress Notes (Signed)
Quick Note:  Call patient,  Nothing specific found on the lab work but there are several nonspecific abnormalities that should be evaluated: 1) elevated lfts- stop zocor and repeat liver and lipid panel in one month 2) a nonspecific inflammatory "marker" is elevated-- it's unclear why-- I'd like to evaluate further.  -check Serum protein immunoelectrophoresis, repeat CBC with differential and send for pathology review 3) platelet count is low--- we will recheck with CBD ______

## 2012-09-11 ENCOUNTER — Ambulatory Visit: Payer: BC Managed Care – PPO | Admitting: Gastroenterology

## 2012-09-18 ENCOUNTER — Telehealth: Payer: Self-pay | Admitting: Internal Medicine

## 2012-09-18 ENCOUNTER — Inpatient Hospital Stay (HOSPITAL_COMMUNITY)
Admission: EM | Admit: 2012-09-18 | Discharge: 2012-09-22 | DRG: 188 | Disposition: A | Discharge status: Home / Self Care | Payer: BC Managed Care – PPO | Attending: Internal Medicine | Admitting: Internal Medicine

## 2012-09-18 ENCOUNTER — Encounter (HOSPITAL_COMMUNITY): Payer: Self-pay | Admitting: Cardiology

## 2012-09-18 ENCOUNTER — Ambulatory Visit: Payer: Self-pay | Admitting: Family Medicine

## 2012-09-18 ENCOUNTER — Emergency Department (HOSPITAL_COMMUNITY): Payer: BC Managed Care – PPO

## 2012-09-18 DIAGNOSIS — F411 Generalized anxiety disorder: Secondary | ICD-10-CM | POA: Diagnosis present

## 2012-09-18 DIAGNOSIS — Z79899 Other long term (current) drug therapy: Secondary | ICD-10-CM

## 2012-09-18 DIAGNOSIS — I10 Essential (primary) hypertension: Secondary | ICD-10-CM | POA: Diagnosis present

## 2012-09-18 DIAGNOSIS — E785 Hyperlipidemia, unspecified: Secondary | ICD-10-CM | POA: Diagnosis present

## 2012-09-18 DIAGNOSIS — I671 Cerebral aneurysm, nonruptured: Secondary | ICD-10-CM

## 2012-09-18 DIAGNOSIS — K922 Gastrointestinal hemorrhage, unspecified: Secondary | ICD-10-CM

## 2012-09-18 DIAGNOSIS — K700 Alcoholic fatty liver: Secondary | ICD-10-CM | POA: Diagnosis present

## 2012-09-18 DIAGNOSIS — D696 Thrombocytopenia, unspecified: Secondary | ICD-10-CM | POA: Diagnosis present

## 2012-09-18 DIAGNOSIS — Z881 Allergy status to other antibiotic agents status: Secondary | ICD-10-CM

## 2012-09-18 DIAGNOSIS — D684 Acquired coagulation factor deficiency: Secondary | ICD-10-CM | POA: Diagnosis present

## 2012-09-18 DIAGNOSIS — Z8673 Personal history of transient ischemic attack (TIA), and cerebral infarction without residual deficits: Secondary | ICD-10-CM

## 2012-09-18 DIAGNOSIS — E871 Hypo-osmolality and hyponatremia: Secondary | ICD-10-CM | POA: Diagnosis present

## 2012-09-18 DIAGNOSIS — M129 Arthropathy, unspecified: Secondary | ICD-10-CM | POA: Diagnosis present

## 2012-09-18 DIAGNOSIS — Z791 Long term (current) use of non-steroidal anti-inflammatories (NSAID): Secondary | ICD-10-CM

## 2012-09-18 DIAGNOSIS — Z7982 Long term (current) use of aspirin: Secondary | ICD-10-CM

## 2012-09-18 DIAGNOSIS — Z6833 Body mass index (BMI) 33.0-33.9, adult: Secondary | ICD-10-CM

## 2012-09-18 DIAGNOSIS — K2920 Alcoholic gastritis without bleeding: Secondary | ICD-10-CM | POA: Diagnosis present

## 2012-09-18 DIAGNOSIS — Z23 Encounter for immunization: Secondary | ICD-10-CM

## 2012-09-18 DIAGNOSIS — K2980 Duodenitis without bleeding: Secondary | ICD-10-CM | POA: Diagnosis present

## 2012-09-18 DIAGNOSIS — R791 Abnormal coagulation profile: Secondary | ICD-10-CM | POA: Diagnosis present

## 2012-09-18 DIAGNOSIS — D649 Anemia, unspecified: Secondary | ICD-10-CM

## 2012-09-18 DIAGNOSIS — K648 Other hemorrhoids: Principal | ICD-10-CM | POA: Diagnosis present

## 2012-09-18 DIAGNOSIS — D62 Acute posthemorrhagic anemia: Secondary | ICD-10-CM | POA: Diagnosis present

## 2012-09-18 DIAGNOSIS — Y92009 Unspecified place in unspecified non-institutional (private) residence as the place of occurrence of the external cause: Secondary | ICD-10-CM

## 2012-09-18 DIAGNOSIS — F1020 Alcohol dependence, uncomplicated: Secondary | ICD-10-CM | POA: Diagnosis present

## 2012-09-18 DIAGNOSIS — F3289 Other specified depressive episodes: Secondary | ICD-10-CM | POA: Diagnosis present

## 2012-09-18 DIAGNOSIS — Z8679 Personal history of other diseases of the circulatory system: Secondary | ICD-10-CM

## 2012-09-18 DIAGNOSIS — N289 Disorder of kidney and ureter, unspecified: Secondary | ICD-10-CM | POA: Diagnosis present

## 2012-09-18 DIAGNOSIS — K5730 Diverticulosis of large intestine without perforation or abscess without bleeding: Secondary | ICD-10-CM | POA: Diagnosis present

## 2012-09-18 DIAGNOSIS — K2960 Other gastritis without bleeding: Secondary | ICD-10-CM | POA: Diagnosis present

## 2012-09-18 DIAGNOSIS — R17 Unspecified jaundice: Secondary | ICD-10-CM | POA: Diagnosis present

## 2012-09-18 DIAGNOSIS — F1010 Alcohol abuse, uncomplicated: Secondary | ICD-10-CM | POA: Diagnosis present

## 2012-09-18 DIAGNOSIS — R7309 Other abnormal glucose: Secondary | ICD-10-CM

## 2012-09-18 DIAGNOSIS — Z880 Allergy status to penicillin: Secondary | ICD-10-CM

## 2012-09-18 DIAGNOSIS — T3995XA Adverse effect of unspecified nonopioid analgesic, antipyretic and antirheumatic, initial encounter: Secondary | ICD-10-CM | POA: Diagnosis present

## 2012-09-18 DIAGNOSIS — K5900 Constipation, unspecified: Secondary | ICD-10-CM | POA: Diagnosis present

## 2012-09-18 DIAGNOSIS — D6959 Other secondary thrombocytopenia: Secondary | ICD-10-CM | POA: Diagnosis present

## 2012-09-18 HISTORY — DX: Unspecified abdominal hernia without obstruction or gangrene: K46.9

## 2012-09-18 HISTORY — DX: Anxiety disorder, unspecified: F41.9

## 2012-09-18 HISTORY — DX: Unspecified osteoarthritis, unspecified site: M19.90

## 2012-09-18 HISTORY — DX: Cerebral infarction, unspecified: I63.9

## 2012-09-18 HISTORY — DX: Anemia, unspecified: D64.9

## 2012-09-18 LAB — CBC
MCH: 34.4 pg — ABNORMAL HIGH (ref 26.0–34.0)
MCHC: 34.4 g/dL (ref 30.0–36.0)
Platelets: 92 10*3/uL — ABNORMAL LOW (ref 150–400)
Platelets: 72 10*3/uL — ABNORMAL LOW (ref 150–400)
RBC: 2.64 MIL/uL — ABNORMAL LOW (ref 3.87–5.11)
RDW: 15.9 % — ABNORMAL HIGH (ref 11.5–15.5)
RDW: 16.1 % — ABNORMAL HIGH (ref 11.5–15.5)
WBC: 7.2 10*3/uL (ref 4.0–10.5)

## 2012-09-18 LAB — BASIC METABOLIC PANEL
Calcium: 8.2 mg/dL — ABNORMAL LOW (ref 8.4–10.5)
GFR calc Af Amer: 61 mL/min — ABNORMAL LOW (ref >90)
GFR calc non Af Amer: 52 mL/min — ABNORMAL LOW (ref >90)
Sodium: 128 mEq/L — ABNORMAL LOW (ref 135–145)

## 2012-09-18 LAB — COMPREHENSIVE METABOLIC PANEL
ALT: 31 U/L (ref 0–35)
AST: 110 U/L — ABNORMAL HIGH (ref 0–37)
Albumin: 2.5 g/dL — ABNORMAL LOW (ref 3.5–5.2)
CO2: 21 mEq/L (ref 19–32)
Chloride: 95 mEq/L — ABNORMAL LOW (ref 96–112)
GFR calc non Af Amer: 50 mL/min — ABNORMAL LOW (ref >90)
Sodium: 129 mEq/L — ABNORMAL LOW (ref 135–145)
Total Bilirubin: 1.6 mg/dL — ABNORMAL HIGH (ref 0.3–1.2)

## 2012-09-18 LAB — TYPE AND SCREEN: Antibody Screen: NEGATIVE

## 2012-09-18 LAB — OCCULT BLOOD, POC DEVICE: Fecal Occult Bld: POSITIVE — AB

## 2012-09-18 LAB — APTT: aPTT: 42 seconds — ABNORMAL HIGH (ref 24–37)

## 2012-09-18 MED ORDER — SODIUM CHLORIDE 0.9 % IV SOLN
INTRAVENOUS | Status: DC
  Administered 2012-09-18: 1000 mL via INTRAVENOUS
  Administered 2012-09-19: 500 mL via INTRAVENOUS

## 2012-09-18 MED ORDER — IOHEXOL 300 MG/ML  SOLN
100.0000 mL | Freq: Once | INTRAMUSCULAR | Status: AC | PRN
  Administered 2012-09-18: 100 mL via INTRAVENOUS

## 2012-09-18 MED ORDER — SODIUM CHLORIDE 0.9 % IJ SOLN
3.0000 mL | Freq: Two times a day (BID) | INTRAMUSCULAR | Status: DC
  Administered 2012-09-19 – 2012-09-21 (×5): 3 mL via INTRAVENOUS

## 2012-09-18 MED ORDER — ONDANSETRON HCL 4 MG/2ML IJ SOLN
4.0000 mg | Freq: Three times a day (TID) | INTRAMUSCULAR | Status: AC | PRN
  Filled 2012-09-18: qty 2

## 2012-09-18 MED ORDER — SODIUM CHLORIDE 0.9 % IV SOLN: INTRAVENOUS | Status: AC

## 2012-09-18 MED ORDER — SODIUM CHLORIDE 0.9 % IV SOLN
INTRAVENOUS | Status: DC
  Administered 2012-09-18: 19:00:00 via INTRAVENOUS

## 2012-09-18 MED ORDER — IOHEXOL 300 MG/ML  SOLN
25.0000 mL | INTRAMUSCULAR | Status: AC
  Administered 2012-09-18 (×2): 25 mL via ORAL

## 2012-09-18 MED ORDER — SODIUM CHLORIDE 0.9 % IV SOLN
80.0000 mg | Freq: Once | INTRAVENOUS | Status: AC
  Administered 2012-09-18: 80 mg via INTRAVENOUS
  Filled 2012-09-18: qty 80

## 2012-09-18 MED ORDER — INFLUENZA VIRUS VACC SPLIT PF IM SUSP
0.5000 mL | INTRAMUSCULAR | Status: AC
  Administered 2012-09-19: 0.5 mL via INTRAMUSCULAR
  Filled 2012-09-18: qty 0.5

## 2012-09-18 MED ORDER — PHYTONADIONE 5 MG PO TABS
5.0000 mg | ORAL_TABLET | Freq: Once | ORAL | Status: AC
  Administered 2012-09-18: 5 mg via ORAL
  Filled 2012-09-18: qty 1

## 2012-09-18 MED ORDER — ONDANSETRON 4 MG PO TBDP: 4.0000 mg | ORAL_TABLET | Freq: Once | ORAL | Status: DC

## 2012-09-18 MED ORDER — MORPHINE SULFATE 4 MG/ML IJ SOLN: 4.0000 mg | INTRAMUSCULAR | Status: DC | PRN

## 2012-09-18 MED ORDER — ALPRAZOLAM 0.25 MG PO TABS
0.2500 mg | ORAL_TABLET | Freq: Two times a day (BID) | ORAL | Status: DC | PRN
  Administered 2012-09-21: 0.25 mg via ORAL
  Filled 2012-09-18: qty 1

## 2012-09-18 MED ORDER — SODIUM CHLORIDE 0.9 % IV BOLUS (SEPSIS)
500.0000 mL | Freq: Once | INTRAVENOUS | Status: AC
  Administered 2012-09-18: 500 mL via INTRAVENOUS

## 2012-09-18 MED ORDER — OXYCODONE HCL 5 MG PO TABS
5.0000 mg | ORAL_TABLET | Freq: Four times a day (QID) | ORAL | Status: DC | PRN
  Administered 2012-09-18: 5 mg via ORAL
  Filled 2012-09-18 (×2): qty 1

## 2012-09-18 MED ORDER — HYDROMORPHONE HCL PF 1 MG/ML IJ SOLN: 1.0000 mg | INTRAMUSCULAR | Status: AC | PRN

## 2012-09-18 MED ORDER — PANTOPRAZOLE SODIUM 40 MG IV SOLR
40.0000 mg | Freq: Two times a day (BID) | INTRAVENOUS | Status: DC
  Filled 2012-09-18 (×3): qty 40

## 2012-09-18 MED ORDER — SODIUM CHLORIDE 0.9 % IV SOLN
8.0000 mg/h | INTRAVENOUS | Status: DC
  Administered 2012-09-18 (×2): 8 mg/h via INTRAVENOUS
  Filled 2012-09-18 (×5): qty 80

## 2012-09-18 MED ORDER — ONDANSETRON HCL 4 MG/2ML IJ SOLN
4.0000 mg | Freq: Once | INTRAMUSCULAR | Status: AC
  Administered 2012-09-18: 4 mg via INTRAVENOUS
  Filled 2012-09-18: qty 2

## 2012-09-18 NOTE — ED Notes (Signed)
Pt reports see had an incarcerated hernia about 7 months ago and had to have surgery. States since then she has noticed blood in her stool intermittently. States the amount seems to have increased over the past couple of weeks. Reports lower back pain.

## 2012-09-18 NOTE — ED Provider Notes (Signed)
History     CSN: [id number]  Arrival date & time 09/18/12  1220   First MD Initiated Contact with Patient 09/18/12 1308      Chief Complaint  Patient presents with  . Back Pain  . Rectal Bleeding    (Consider location/radiation/quality/duration/timing/severity/associated sxs/prior treatment) HPI  [name] [name] is a 67 y.o. female complaining of increasing back pain, melena and hematochezia over the past 2 weeks. Patient had to have emergent laparotomy need to correct incarcerated small bowel hernia in July of 2013 by Dr. Dwain Sarna. Patient denies abdominal pain, fever, nausea vomiting, hematemesis, diarrhea, chest pain, palpitations, shortness of breath,  dizziness when going from sitting to standing. She does endorse a generalized weakness worsening over the last few days. Patient describes her back pain as moving from the hips to the low back to the upper back usually located on the right side. She denies any numbness, paresthesia, difficulty ambulating. Patient takes a daily aspirin and also takes Aleve for chronic back pain.  Past Medical History  Diagnosis Date  . Hypertension   . H/O brain surgery   . Aneurysm of anterior cerebral artery   . Hernia     Past Surgical History  Procedure Laterality Date  . Laparotomy  01/22/2012    Procedure: EXPLORATORY LAPAROTOMY;  Surgeon: Emelia Loron, MD;  Location: [facility] OR;  Service: General;  Laterality: N/A;  lysis of adhesions    Family History  Problem Relation Age of Onset  . Cancer Father     pancreatic  . Cancer Maternal Grandmother     breast    History  Substance Use Topics  . Smoking status: Never Smoker   . Smokeless tobacco: Not on file  . Alcohol Use: Yes    OB History   Grav Para Term Preterm Abortions TAB SAB Ect Mult Living                  Review of Systems  Constitutional: Negative for fever.  Respiratory: Negative for shortness of breath.   Cardiovascular: Negative for chest pain.   Gastrointestinal: Positive for blood in stool. Negative for nausea, vomiting, abdominal pain and diarrhea.  Musculoskeletal: Positive for back pain.  All other systems reviewed and are negative.    Allergies  Cephalexin and Penicillins  Home Medications   Current Outpatient Rx  Name  Route  Sig  Dispense  Refill  . ALPRAZolam (XANAX) 0.25 MG tablet   Oral   Take 0.25 mg by mouth daily as needed. For anxiety         . benazepril (LOTENSIN) 20 MG tablet      TAKE 1 TABLET ONCE DAILY.   30 tablet   1   . FLUoxetine (PROZAC) 20 MG capsule   Oral   Take 1 capsule by mouth daily.         Marland Kitchen labetalol (NORMODYNE) 100 MG tablet      TAKE 1 TABLET TWICE DAILY.   60 tablet   1   . ZOCOR 40 MG tablet      TAKE 1 TABLET ONCE DAILY.   90 each   3     BP 124/67  Pulse 96  Temp(Src) 98.1 F (36.7 C) (Oral)  Resp 20  SpO2 97%  Physical Exam  Nursing note and vitals reviewed. Constitutional: She is oriented to person, place, and time. She appears well-developed and well-nourished. No distress.  Obese  HENT:  Head: Normocephalic.  Eyes: Conjunctivae and EOM are normal.  Conjunctival pallor  Neck: Normal range of motion.  Cardiovascular: Normal rate, regular rhythm, normal heart sounds and intact distal pulses.   Pulmonary/Chest: Effort normal and breath sounds normal. No stridor. No respiratory distress. She has no wheezes. She has no rales. She exhibits no tenderness.  Abdominal: Soft. Bowel sounds are normal. She exhibits no distension and no mass. There is no tenderness. There is no rebound and no guarding.  Genitourinary:  DRE chaperoned by PA student. Good rectal to tone, dark tarry stools with melanotic smell  Musculoskeletal: Normal range of motion.  Neurological: She is alert and oriented to person, place, and time.  Skin: Skin is warm.  Psychiatric: She has a normal mood and affect.    ED Course  Procedures (including critical care time)  Labs  Reviewed  CBC - Abnormal; Notable for the following:    RBC 2.64 (*)    Hemoglobin 9.1 (*)    HCT 25.6 (*)    MCH 34.5 (*)    RDW 15.9 (*)    Platelets 92 (*)    All other components within normal limits  COMPREHENSIVE METABOLIC PANEL - Abnormal; Notable for the following:    Sodium 129 (*)    Chloride 95 (*)    Glucose, Bld 138 (*)    BUN 28 (*)    Creatinine, Ser 1.14 (*)    Albumin 2.5 (*)    AST 110 (*)    Alkaline Phosphatase 168 (*)    Total Bilirubin 1.6 (*)    GFR calc non Af Amer 50 (*)    GFR calc Af Amer 58 (*)    All other components within normal limits  PROTIME-INR - Abnormal; Notable for the following:    Prothrombin Time 17.8 (*)    INR 1.51 (*)    All other components within normal limits  APTT - Abnormal; Notable for the following:    aPTT 42 (*)    All other components within normal limits  OCCULT BLOOD, POC DEVICE - Abnormal; Notable for the following:    Fecal Occult Bld POSITIVE (*)    All other components within normal limits  TYPE AND SCREEN   Ct Abdomen Pelvis W Contrast  09/18/2012  *RADIOLOGY REPORT*  Clinical Data: 67 year old female with abdominal pelvic pain and rectal bleeding.  CT ABDOMEN AND PELVIS WITH CONTRAST  Technique:  Multidetector CT imaging of the abdomen and pelvis was performed following the standard protocol during bolus administration of intravenous contrast.  Contrast: OMNIPAQUE IOHEXOL 300 MG/ML  SOLN  Comparison: 08/24/2012 and 01/20/2012 CTs  Findings: Cardiomegaly and coronary artery calcifications again identified.  Hepatomegaly and fatty infiltration of the liver again identified. A 2 cm lesion within the left liver (image 19) has not changed since 2013. Gallstones versus sludge noted.  There is no CT evidence of acute cholecystitis. The spleen, adrenal glands, pancreas 10 kidneys are unremarkable.  Postoperative changes within the anterior abdominal/pelvic wall noted without evidence of recurrent hernia. Colonic  diverticulosis identified without diverticulitis. A probable uterine fibroid is noted. The bladder is within normal limits. No free fluid, enlarged lymph nodes, biliary dilation or abdominal aortic aneurysm identified.  No acute or suspicious bony abnormalities identified.  Degenerative changes within the lumbar spine identified.  IMPRESSION: No evidence of acute abnormality.  Hepatomegaly and fatty infiltration of the liver.  Probable left hepatic hemangioma.  Postoperative changes within the anterior abdominal/pelvic wall without evidence of recurrent hernia.  Cardiomegaly and coronary artery disease.  Colonic diverticulosis and  probable uterine fibroid.   Original Report Authenticated By: Harmon Pier, M.D.     Date: 09/18/2012  Rate: 82  Rhythm: normal sinus rhythm  QRS Axis: normal  Intervals: normal  ST/T Wave abnormalities: normal  Conduction Disutrbances:none  Narrative Interpretation:   Old EKG Reviewed: unchanged   1. UGI bleed   2. Anemia       MDM   KAYDEN AMEND is a 67 y.o. female  with increased melena and bright red blood per rectum over the last 2 weeks associated with back pain. Stool is melanotic, she has no diarrhea. Abdominal exam is benign and nonsurgical. Her H&H is 9.1/25.6 this is decreased 3.6 in the last 2 weeks from 12.0/35.2 on February 25. She reports a generalized weakness and fatigue which I interpret to be symptomatic anemia. Patient also has a mild acute kidney injury with a creatinine of 1.14 and a BUN of 28. Soft blood pressures in the 80s over 50s.   She has been taking daily aspirin and Aleve for control of her low back pain this is likely an upper GI bleed secondary to NSAID use. Protonix bolus and drip started. LFTs are elevated with AST of 110. INR the elevated at 1.51. She drinks 3 glasses of wine daily.   This is a shared visit with the attending physician who personally evaluated the patient and agrees with the care plan.    Filed Vitals:    09/18/12 1227 09/18/12 1401 09/18/12 1402 09/18/12 1403  BP: 124/67 115/57 98/51 87/50   Pulse: 96 82 88 93  Temp: 98.1 F (36.7 C)     TempSrc: Oral     Resp: 20 16 16 22   SpO2: 97% 96% 99% 97%     Pt will be admitted to tele by by [facility] hospitalist Dr. Elvera Lennox.   Gastroenterology consult from Dr. Loreta Ave appreciated she will evaluate the Pt.       Wynetta Emery, PA-C 09/18/12 1658

## 2012-09-18 NOTE — ED Notes (Signed)
Dr. Gherghe at bedside 

## 2012-09-18 NOTE — Consult Note (Signed)
Cross cover for [facility]. Canary Brim.  Reason for Consult: Melena x 3 weeks. Referring Physician: THP  [name] [name] is an 67 y.o. female.  HPI: Patient gives a history of melena with intermittent BRBPR for 3 weeks. Scheduled to see Dr. Gerilyn Pilgrim on 10/06/12. She apparently had been having similar problems before her incarcerated small bowel hernia was repaired last year in July by Dr. Dwain Sarna. Apparently her symptoms resolved after surgery only to recur 3 weeks ago. She denies having any dysphagia, odynophagia, nausea, vomiting, abdominal pain or abnormal weight loss. She had had some constipation lately and had a BM today after 3 days. Prior to that she had some loose melenic stools. She gives a history of having a colonoscopy done about 7 years ago by Dr. Gerilyn Pilgrim; the exam revealed diverticulosis but was otherwise normal. She denies a previous history of ulcers, jaundice or colitis. She admits to drinking 4-5 glasses of wine 4-5 days a week. She has also been taking Aspirin and Aleve over the last few weeks for chronic back pain. On admission she was found to have a 3 gms drop in her hemoglobin in  from a baseline of 12 gms amonth ago. She has been taking Aspirin for a CVA in the past. She is on Prozac for depression.    Past Medical History  Diagnosis Date  . Hypertension       . 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 Incarcerated small bowel hernia        Depression      Hyperlipidemia      Morbid obesity Past Surgical History  Procedure Laterality Date  . Laparotomy  01/22/2012    Procedure: EXPLORATORY LAPAROTOMY;  Surgeon: Emelia Loron, MD;  Location: [facility] OR;  Service: General;  Laterality: N/A;  lysis of adhesions    Family History  Problem Relation Age of Onset  . Cancer Father     pancreatic  . Cancer Maternal Grandmother     breast   Social History:  reports that she has never smoked. She does not have any smokeless tobacco history on file. She  reports that  drinks alcohol. She reports that she does not use illicit drugs.  Allergies:  Allergies  Allergen Reactions  . Cephalexin Rash  . Penicillins Rash   Medications: I have reviewed the patient's current medications.  Results for orders placed during the hospital encounter of 09/18/12 (from the past 48 hour(s))  CBC     Status: Abnormal   Collection Time    09/18/12 12:29 PM      Result Value Range   WBC 7.2  4.0 - 10.5 K/uL   RBC 2.64 (*) 3.87 - 5.11 MIL/uL   Hemoglobin 9.1 (*) 12.0 - 15.0 g/dL   HCT 04.5 (*) 40.9 - 81.1 %   MCV 97.0  78.0 - 100.0 fL   MCH 34.5 (*) 26.0 - 34.0 pg   MCHC 35.5  30.0 - 36.0 g/dL   RDW 91.4 (*) 78.2 - 95.6 %   Platelets 92 (*) 150 - 400 K/uL   Comment: PLATELET COUNT CONFIRMED BY SMEAR  COMPREHENSIVE METABOLIC PANEL     Status: Abnormal   Collection Time    09/18/12 12:29 PM      Result Value Range   Sodium 129 (*) 135 - 145 mEq/L   Potassium 5.0  3.5 - 5.1 mEq/L   Chloride 95 (*) 96 - 112 mEq/L   CO2 21  19 -  32 mEq/L   Glucose, Bld 138 (*) 70 - 99 mg/dL   BUN 28 (*) 6 - 23 mg/dL   Creatinine, Ser 1.61 (*) 0.50 - 1.10 mg/dL   Calcium 9.1  8.4 - 09.6 mg/dL   Total Protein 7.6  6.0 - 8.3 g/dL   Albumin 2.5 (*) 3.5 - 5.2 g/dL   AST 045 (*) 0 - 37 U/L   ALT 31  0 - 35 U/L   Alkaline Phosphatase 168 (*) 39 - 117 U/L   Total Bilirubin 1.6 (*) 0.3 - 1.2 mg/dL   GFR calc non Af Amer 50 (*) >90 mL/min   GFR calc Af Amer 58 (*) >90 mL/min   Comment:            The eGFR has been calculated     using the CKD EPI equation.     This calculation has not been     validated in all clinical     situations.     eGFR's persistently     <90 mL/min signify     possible Chronic Kidney Disease.  PROTIME-INR     Status: Abnormal   Collection Time    09/18/12  1:38 PM      Result Value Range   Prothrombin Time 17.8 (*) 11.6 - 15.2 seconds   INR 1.51 (*) 0.00 - 1.49  APTT     Status: Abnormal   Collection Time    09/18/12  1:38 PM       Result Value Range   aPTT 42 (*) 24 - 37 seconds   Comment:            IF BASELINE aPTT IS ELEVATED,     SUGGEST PATIENT RISK ASSESSMENT     BE USED TO DETERMINE APPROPRIATE     ANTICOAGULANT THERAPY.  TYPE AND SCREEN     Status: None   Collection Time    09/18/12  1:43 PM      Result Value Range   ABO/RH(D) O NEG     Antibody Screen NEG     Sample Expiration 09/21/2012    OCCULT BLOOD, POC DEVICE     Status: Abnormal   Collection Time    09/18/12  2:46 PM      Result Value Range   Fecal Occult Bld POSITIVE (*) NEGATIVE    Ct Abdomen Pelvis W Contrast  09/18/2012  *RADIOLOGY REPORT*  Clinical Data: 66 year old female with abdominal pelvic pain and rectal bleeding.  CT ABDOMEN AND PELVIS WITH CONTRAST  Technique:  Multidetector CT imaging of the abdomen and pelvis was performed following the standard protocol during bolus administration of intravenous contrast.  Contrast: OMNIPAQUE IOHEXOL 300 MG/ML  SOLN  Comparison: 08/24/2012 and 01/20/2012 CTs  Findings: Cardiomegaly and coronary artery calcifications again identified.  Hepatomegaly and fatty infiltration of the liver again identified. A 2 cm lesion within the left liver (image 19) has not changed since 2013. Gallstones versus sludge noted.  There is no CT evidence of acute cholecystitis. The spleen, adrenal glands, pancreas 10 kidneys are unremarkable.  Postoperative changes within the anterior abdominal/pelvic wall noted without evidence of recurrent hernia. Colonic diverticulosis identified without diverticulitis. A probable uterine fibroid is noted. The bladder is within normal limits. No free fluid, enlarged lymph nodes, biliary dilation or abdominal aortic aneurysm identified.  No acute or suspicious bony abnormalities identified.  Degenerative changes within the lumbar spine identified.  IMPRESSION: No evidence of acute abnormality.  Hepatomegaly and fatty infiltration of  the liver.  Probable left hepatic hemangioma.   Postoperative changes within the anterior abdominal/pelvic wall without evidence of recurrent hernia.  Cardiomegaly and coronary artery disease.  Colonic diverticulosis and probable uterine fibroid.   Original Report Authenticated By: Harmon Pier, M.D.    Review of Systems  Constitutional: Negative for fever, chills, weight loss, malaise/fatigue and diaphoresis.  Eyes: Negative.   Respiratory: Negative for shortness of breath.   Cardiovascular: Negative.   Gastrointestinal: Positive for constipation, blood in stool and melena. Negative for heartburn, nausea, vomiting, abdominal pain and diarrhea.  Musculoskeletal: Positive for myalgias and falls. Negative for back pain and joint pain.  Skin: Positive for itching. Negative for rash.  Neurological: Positive for weakness and headaches.   Blood pressure 116/57, pulse 85, temperature 98.1 F (36.7 C), temperature source Oral, resp. rate 17, SpO2 98.00%. Physical Exam  Constitutional: She is oriented to person, place, and time. She appears well-developed and well-nourished.  HENT:  Head: Normocephalic and atraumatic.  Eyes: Conjunctivae and EOM are normal. Pupils are equal, round, and reactive to light.  Neck: Normal range of motion. Neck supple.  Cardiovascular: Normal rate and regular rhythm.   Respiratory: Effort normal and breath sounds normal.  GI: Soft. Bowel sounds are normal. She exhibits no distension and no mass. There is no tenderness. There is no rebound and no guarding.  Musculoskeletal: Normal range of motion.  Neurological: She is alert and oriented to person, place, and time.  Skin: Skin is warm and dry.  Psychiatric: She has a normal mood and affect. Her behavior is normal. Thought content normal.    Assessment/Plan: A/P1) Anemia with melenic stools with BPBPR: Will rehydrate patient and follow serial CBC's. EGD planned for tomorrow morning. Rule out PUD vs erosive esophagitis etc. She may need a colonoscopy if the EGD is  unrevealing.    2) Fatty liver with a history alcohol use ?abuse, elevated LFT's, slightly elevated INR, thrombocytopenia; agree with Vitamin K supplementation. 3) Chronic constipation/diverticulosis.  4) History of a cerebral aneurysm-coiled 10 years ago. 5) Hyperlipidemia.  6) Morbid obesity.  7) Depression. MANN,JYOTHI 09/18/2012, 6:03 PM

## 2012-09-18 NOTE — Telephone Encounter (Signed)
Patient Information:  Caller Name: Gerlene Burdock  Phone: [phone]  Patient: [name] [name], [name] [name]  Gender: Female  DOB: 1945/08/05  Age: 67 Years  PCP: Birdie Sons (Adults only)  Office Follow Up:  Does the office need to follow up with this patient?: No  Instructions For The Office: N/A  RN Note:  Advised caller to call office back if wife gets worse before appt time.    Symptoms  Reason For Call & Symptoms: Calling because having back pain, not able to sit.  Pt is having bloody diarrhea and nausea.  Has been going on for about 9 months.  She did have surgery in July for an abdominal hernia.  Said she was seen by a Dr. Dwain Sarna in Feb and he set her up for a colonoscopy ASAP but husband concerned because this has been pushed back twice because of the weather and she is not able to get in done now until April.  He said this is too long.  She can't go on like this and wants to know if MD can get it moved up any sooner.  He is concerned she has colon CA. Said also Dr. Cato Mulligan had wanted to see her to have labs drawn.  Reviewed Health History In EMR: No  Reviewed Medications In EMR: No  Reviewed Allergies In EMR: No  Reviewed Surgeries / Procedures: No  Date of Onset of Symptoms: 12/07/2011  Guideline(s) Used:  Diarrhea  Rectal Bleeding  Disposition Per Guideline:   Go to ED Now (or to Office with PCP Approval)  Reason For Disposition Reached:   Bloody, black, or tarry bowel movements  Advice Given:  N/A  Patient Will Follow Care Advice:  YES  Appointment Scheduled:  09/18/2012 14:15:00 Appointment Scheduled Provider:  Kriste Basque (Family Practice)

## 2012-09-18 NOTE — ED Provider Notes (Signed)
Rectal bleeding intermittent for the past 2 weeks. She denies lightheadedness. Patient has been taking Aleve for back pain over the past several weeks. She denies weakness or lightheadedness.  exam alert no distress Glasgow Coma Score 15. No data hemoglobin has dropped approximately 3 g in 2 weeks. Plan admit, type and screen, suggest GI consultation for endoscopy  Doug Sou, MD 09/18/12 1547

## 2012-09-18 NOTE — H&P (Addendum)
[facility] History and Physical  [name] [name] AVW:[id number] DOB: 1945-08-20 DOA: 09/18/2012  Referring physician: Dr. Raeford Razor PCP: Judie Petit, MD  Specialists: [facility]  Chief Complaint: Bright red blood per rectum/melena for 2 weeks  HPI: [name] [name] is a 67 y.o. female has a past medical history significant for hyperlipidemia, hypertension, history of CVA, history of incarcerated hernia 2013 status post surgery presents with a chief complaint of bright red blood per rectum intermittently for the past 2 weeks, as well as dark tarry stools, again intermittently. She reports that this has been on on and off problem for the past several months she she had the surgery for incarcerated hernia. She was followed by the [facility], with plans for colonoscopy, and her next appointment was April 1. She denies any abdominal pain, fever, endorses some nausea however denies vomiting, denies diarrhea. She has no chest pain palpitations or shortness of breath. She denies dizziness or lightheadedness. She does feel weak for the past few days. She endorses back pain which has been chronic, worse over the last few months, and she's been taking Aleve for that. She is also on full dose aspirin following her CVA in the past. She denies any numbness. She denies any history of liver disease, however does endorse drinking up to 3 drinks per day. She's been doing so for the past 2 years.  Review of Systems: As per history of present illness, otherwise negative  Past Medical History  Diagnosis Date  . Hypertension   . H/O brain surgery   . Aneurysm of anterior cerebral artery   . Hernia    Past Surgical History  Procedure Laterality Date  . Laparotomy  01/22/2012    Procedure: EXPLORATORY LAPAROTOMY;  Surgeon: Emelia Loron, MD;  Location: [facility] OR;  Service: General;  Laterality: N/A;  lysis of adhesions   Social History:  reports that she has never smoked. She does not have any  smokeless tobacco history on file. She reports that  drinks alcohol. She reports that she does not use illicit drugs.  Allergies  Allergen Reactions  . Cephalexin Rash  . Penicillins Rash    Family History  Problem Relation Age of Onset  . Cancer Father     pancreatic  . Cancer Maternal Grandmother     breast    Prior to Admission medications   Medication Sig Start Date End Date Taking? Authorizing Moxie Kalil  aspirin 325 MG tablet Take 325 mg by mouth daily.   Yes Historical Abrahim Sargent, MD  benazepril (LOTENSIN) 20 MG tablet Take 20 mg by mouth daily.   Yes Historical Gustav Knueppel, MD  HAWTHORNE PO Take 1 tablet by mouth daily.   Yes Historical Liesel Peckenpaugh, MD  labetalol (NORMODYNE) 200 MG tablet Take 200 mg by mouth 2 (two) times daily.   Yes Historical Rahshawn Remo, MD  naproxen sodium (ALEVE) 220 MG tablet Take 220 mg by mouth 2 (two) times daily as needed (pain).   Yes Historical Erubiel Manasco, MD  oxyCODONE-acetaminophen (PERCOCET/ROXICET) 5-325 MG per tablet Take 1 tablet by mouth every 6 (six) hours as needed for pain.   Yes Historical Nasser Ku, MD  simvastatin (ZOCOR) 40 MG tablet Take 40 mg by mouth every evening.   Yes Historical Korion Cuevas, MD  ALPRAZolam (XANAX) 0.25 MG tablet Take 0.25 mg by mouth daily as needed. For anxiety    Historical Michaelle Bottomley, MD   Physical Exam: Filed Vitals:   09/18/12 1402 09/18/12 1403 09/18/12 1412 09/18/12 1557  BP: 98/51 87/50 97/59  104/56  Pulse: 88 93 82 85  Temp:      TempSrc:      Resp: 16 22 15    SpO2: 99% 97% 98% 99%     General:  NAD, pleasant Caucasian female  Eyes: PERRL, EOMI, mild scleral icterus  ENT: moist oropharynx  Neck: supple, no JVD  Cardiovascular: RRR without MRG   Respiratory: CTA biL, good air movement without wheezing, rhonchi or crackled  Abdomen: soft, non tender to palpation, positive bowel sounds, no guarding, no rebound  Skin: no rashes  Musculoskeletal: Trace peripheral edema  Psychiatric: normal mood and  affect  Neurologic: CN 2-12 grossly intact, MS 5/5 in all 4  Labs on Admission:  Basic Metabolic Panel:  Recent Labs Lab 09/18/12 1229  NA 129*  K 5.0  CL 95*  CO2 21  GLUCOSE 138*  BUN 28*  CREATININE 1.14*  CALCIUM 9.1   Liver Function Tests:  Recent Labs Lab 09/18/12 1229  AST 110*  ALT 31  ALKPHOS 168*  BILITOT 1.6*  PROT 7.6  ALBUMIN 2.5*   CBC:  Recent Labs Lab 09/18/12 1229  WBC 7.2  HGB 9.1*  HCT 25.6*  MCV 97.0  PLT 92*   Radiological Exams on Admission: Ct Abdomen Pelvis W Contrast  09/18/2012  *RADIOLOGY REPORT*  Clinical Data: 67 year old female with abdominal pelvic pain and rectal bleeding.  CT ABDOMEN AND PELVIS WITH CONTRAST  Technique:  Multidetector CT imaging of the abdomen and pelvis was performed following the standard protocol during bolus administration of intravenous contrast.  Contrast: OMNIPAQUE IOHEXOL 300 MG/ML  SOLN  Comparison: 08/24/2012 and 01/20/2012 CTs  Findings: Cardiomegaly and coronary artery calcifications again identified.  Hepatomegaly and fatty infiltration of the liver again identified. A 2 cm lesion within the left liver (image 19) has not changed since 2013. Gallstones versus sludge noted.  There is no CT evidence of acute cholecystitis. The spleen, adrenal glands, pancreas 10 kidneys are unremarkable.  Postoperative changes within the anterior abdominal/pelvic wall noted without evidence of recurrent hernia. Colonic diverticulosis identified without diverticulitis. A probable uterine fibroid is noted. The bladder is within normal limits. No free fluid, enlarged lymph nodes, biliary dilation or abdominal aortic aneurysm identified.  No acute or suspicious bony abnormalities identified.  Degenerative changes within the lumbar spine identified.  IMPRESSION: No evidence of acute abnormality.  Hepatomegaly and fatty infiltration of the liver.  Probable left hepatic hemangioma.  Postoperative changes within the anterior  abdominal/pelvic wall without evidence of recurrent hernia.  Cardiomegaly and coronary artery disease.  Colonic diverticulosis and probable uterine fibroid.   Original Report Authenticated By: Harmon Pier, M.D.    EKG: Independently reviewed.  Assessment/Plan Active Problems:   HYPERLIPIDEMIA   HYPERTENSION   DIVERTICULOSIS OF COLON   CEREBROVASCULAR ACCIDENT, HX OF   Anemia due to blood loss, acute   GI bleed   Alcohol abuse   Hyponatremia   Thrombocytopenia   Elevated INR  1. GI bleed - has components of both upper and lower GI bleed. [facility] has been consulted. A medically patient n.p.o. until she is evaluated by them. IV PPI. Holding her aspirin or other NSAIDs. 2. Elevated INR - she is on no Coumadin or other medication that'll explain this. I suspect she might have an underlying liver disease. Vitamin K. 3. AKI - may be related to dehydration? 2/2 bleed. Will monitor with IVF.  4. Anemia - hemoglobin is 9.1 this admission, it was 12 about a month ago. We'll continue to  monitor every 8 hours 5. Hyponatremia - unclear etiology, will monitor.  6. Elevated liver enzymes - this was thought to be secondary to her simvastatin, which was discontinued about 2 weeks ago. They are persistently elevated, albeit better. 7. Thrombocytopenia - this may also be related to chronic liver disease. 8. Elevated bilirubin - querry again chronic liver disease. 9. Alcohol use - patient denies history of withdrawal. We'll continue to monitor, and if needed we'll start CIWA protocol. 10. Ppx - SCD  Code Status: Full code  Family Communication: Husband at bedside  Disposition Plan: To be determined  Time spent: 55 minutes  Costin M. Elvera Lennox, MD [facility] Pager [phone]  If 7PM-7AM, please contact night-coverage [email or web address] Password [facility] 09/18/2012, 4:46 PM

## 2012-09-18 NOTE — ED Notes (Signed)
CT notified that pt has completed her contrast.  Pharmacy called and notified that we still need protonix.  They stated they are sending it now.

## 2012-09-18 NOTE — Telephone Encounter (Signed)
Per Dr Selena Batten patient should go to ER.  Patient is aware and ER informed.

## 2012-09-19 ENCOUNTER — Encounter (HOSPITAL_COMMUNITY)
Admission: EM | Disposition: A | Discharge status: Home / Self Care | Payer: Self-pay | Source: Home / Self Care | Attending: Internal Medicine

## 2012-09-19 ENCOUNTER — Encounter (HOSPITAL_COMMUNITY): Payer: Self-pay | Admitting: *Deleted

## 2012-09-19 DIAGNOSIS — E871 Hypo-osmolality and hyponatremia: Secondary | ICD-10-CM

## 2012-09-19 DIAGNOSIS — D62 Acute posthemorrhagic anemia: Secondary | ICD-10-CM

## 2012-09-19 HISTORY — PX: ESOPHAGOGASTRODUODENOSCOPY: SHX5428

## 2012-09-19 LAB — COMPREHENSIVE METABOLIC PANEL
ALT: 26 U/L (ref 0–35)
AST: 87 U/L — ABNORMAL HIGH (ref 0–37)
CO2: 23 mEq/L (ref 19–32)
Calcium: 7.7 mg/dL — ABNORMAL LOW (ref 8.4–10.5)
Chloride: 102 mEq/L (ref 96–112)
GFR calc non Af Amer: 58 mL/min — ABNORMAL LOW (ref >90)
Potassium: 4.2 mEq/L (ref 3.5–5.1)
Sodium: 133 mEq/L — ABNORMAL LOW (ref 135–145)

## 2012-09-19 LAB — CBC
HCT: 27.9 % — ABNORMAL LOW (ref 36.0–46.0)
Hemoglobin: 9.6 g/dL — ABNORMAL LOW (ref 12.0–15.0)
MCH: 34.9 pg — ABNORMAL HIGH (ref 26.0–34.0)
MCH: 34.2 pg — ABNORMAL HIGH (ref 26.0–34.0)
MCHC: 34.4 g/dL (ref 30.0–36.0)
Platelets: 66 10*3/uL — ABNORMAL LOW (ref 150–400)
RBC: 2.32 MIL/uL — ABNORMAL LOW (ref 3.87–5.11)
RBC: 2.81 MIL/uL — ABNORMAL LOW (ref 3.87–5.11)
WBC: 3.8 10*3/uL — ABNORMAL LOW (ref 4.0–10.5)

## 2012-09-19 LAB — APTT: aPTT: 44 seconds — ABNORMAL HIGH (ref 24–37)

## 2012-09-19 LAB — PREPARE RBC (CROSSMATCH)

## 2012-09-19 SURGERY — EGD (ESOPHAGOGASTRODUODENOSCOPY)
Anesthesia: Moderate Sedation

## 2012-09-19 MED ORDER — DIPHENHYDRAMINE HCL 50 MG/ML IJ SOLN
INTRAMUSCULAR | Status: AC
  Filled 2012-09-19: qty 1

## 2012-09-19 MED ORDER — SODIUM CHLORIDE 0.9 % IV SOLN
INTRAVENOUS | Status: DC
  Administered 2012-09-19: 10 mL/h via INTRAVENOUS

## 2012-09-19 MED ORDER — FENTANYL CITRATE 0.05 MG/ML IJ SOLN
INTRAMUSCULAR | Status: AC
  Filled 2012-09-19: qty 4

## 2012-09-19 MED ORDER — MIDAZOLAM HCL 5 MG/ML IJ SOLN
INTRAMUSCULAR | Status: AC
  Filled 2012-09-19: qty 4

## 2012-09-19 MED ORDER — PEG 3350-KCL-NA BICARB-NACL 420 G PO SOLR
4000.0000 mL | Freq: Once | ORAL | Status: AC
  Administered 2012-09-19: 4000 mL via ORAL
  Filled 2012-09-19: qty 4000

## 2012-09-19 MED ORDER — FUROSEMIDE 10 MG/ML IJ SOLN
20.0000 mg | Freq: Once | INTRAMUSCULAR | Status: AC
  Administered 2012-09-19: 20 mg via INTRAVENOUS
  Filled 2012-09-19: qty 2

## 2012-09-19 MED ORDER — ACETAMINOPHEN 325 MG PO TABS
650.0000 mg | ORAL_TABLET | Freq: Once | ORAL | Status: AC
Start: 2012-09-19 — End: 2012-09-19
  Administered 2012-09-19: 650 mg via ORAL
  Filled 2012-09-19: qty 1

## 2012-09-19 MED ORDER — DIPHENHYDRAMINE HCL 50 MG/ML IJ SOLN
25.0000 mg | Freq: Once | INTRAMUSCULAR | Status: AC
  Administered 2012-09-19: 25 mg via INTRAVENOUS
  Filled 2012-09-19: qty 1

## 2012-09-19 MED ORDER — SODIUM CHLORIDE 0.9 % IV SOLN
INTRAVENOUS | Status: DC
  Administered 2012-09-20: 500 mL via INTRAVENOUS

## 2012-09-19 MED ORDER — MIDAZOLAM HCL 10 MG/2ML IJ SOLN
INTRAMUSCULAR | Status: DC | PRN
  Administered 2012-09-19 (×2): 2 mg via INTRAVENOUS

## 2012-09-19 MED ORDER — PANTOPRAZOLE SODIUM 40 MG IV SOLR
40.0000 mg | Freq: Two times a day (BID) | INTRAVENOUS | Status: DC
  Administered 2012-09-19 – 2012-09-20 (×3): 40 mg via INTRAVENOUS
  Filled 2012-09-19 (×5): qty 40

## 2012-09-19 MED ORDER — BUTAMBEN-TETRACAINE-BENZOCAINE 2-2-14 % EX AERO
INHALATION_SPRAY | CUTANEOUS | Status: DC | PRN
  Administered 2012-09-19: 2 via TOPICAL

## 2012-09-19 MED ORDER — FENTANYL CITRATE 0.05 MG/ML IJ SOLN
INTRAMUSCULAR | Status: DC | PRN
  Administered 2012-09-19 (×2): 25 ug via INTRAVENOUS

## 2012-09-19 NOTE — Progress Notes (Signed)
PATIENT DETAILS Name: [name] [name] Age: 67 y.o. Sex: female Date of Birth: 1945/10/12 Admit Date: 09/18/2012 Admitting Physician Pamella Pert, MD ZOX:[id number] Sherilyn Cooter, MD  Subjective: Numerous episodes of black tarry stools overnight  Assessment/Plan: GI bleed-likely upper - Continue with Protonix infusion - EGD today - Mostly having melanotic stools with intermittent bright red blood per rectum-suspect that she would need an EGD first-if that is negative then she will need a colonoscopy- does have a history of diverticulosis - Does use aspirin and NSAIDs at home-currently on hold  Acute blood loss anemia - Hemoglobin down to 8.1, prior baseline of around 12 - Since actively bleeding, we'll go ahead and transfuse 2 units of PRBC  - Check CBC q. 8 hours  Thrombocytopenia -? 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 of wine 5 days a week - No signs of withdrawal currently - Monitor for signs of withdrawal  Hyponatremia - Suspect secondary to GI loss - Sodium significantly improved with hydration - Monitor electrolytes periodically  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 - Given significant GI bleed and thrombocytopenia-we need to hold aspirin - No focal deficits on exam  History of brain aneurysm - Status post coiling  Hypertension -BP currently controlled-currently off all antihypertensive medications  Dyslipidemia - Resume statins on discharge  Anxiety - As needed Xanax  Disposition: Remain inpatient  DVT Prophylaxis: SCDs  Code Status: Full code  Procedures:  None so far  CONSULTS:  GI  PHYSICAL EXAM: Vital signs in last 24  hours: Filed Vitals:   09/18/12 1806 09/18/12 2100 09/19/12 0504 09/19/12 0853  BP: 100/64 115/72 116/72 125/75  Pulse: 82 80 86 85  Temp: 98.3 F (36.8 C) 98.5 F (36.9 C) 98.1 F (36.7 C) 99.1 F (37.3 C)  TempSrc: Oral Oral Oral Oral  Resp: 18 14 16 17   Height: 5\' 5"  (1.651 m)     Weight: 93.123 kg (205 lb 4.8 oz)     SpO2: 96% 94% 96% 100%    Weight change:  Body mass index is 34.16 kg/(m^2).   Gen Exam: Awake and alert with clear speech.   Neck: Supple, No JVD.   Chest: B/L Clear.   CVS: S1 S2 Regular, no murmurs.  Abdomen: soft, BS +, non tender, non distended.  Extremities: no edema, lower extremities warm to touch. Neurologic: Non Focal.   Skin: No Rash.   Wounds: N/A.    Intake/Output from previous day:  Intake/Output Summary (Last 24 hours) at 09/19/12 0915 Last data filed at 09/19/12 0500  Gross per 24 hour  Intake 2020.83 ml  Output      0 ml  Net 2020.83 ml     LAB RESULTS: CBC  Recent Labs Lab 09/18/12 1229 09/18/12 2004 09/19/12 0550  WBC 7.2 5.6 3.8*  HGB 9.1* 8.4* 8.1*  HCT 25.6* 24.4* 23.4*  PLT 92* 72* 66*  MCV 97.0 100.0 100.9*  MCH 34.5* 34.4* 34.9*  MCHC 35.5 34.4 34.6  RDW 15.9* 16.1* 16.2*    Chemistries   Recent Labs Lab 09/18/12 1229 09/18/12 2004 09/19/12 0550  NA 129* 128* 133*  K 5.0 4.9 4.2  CL 95* 98 102  CO2 21 22 23   GLUCOSE 138*  104* 98  BUN 28* 28* 25*  CREATININE 1.14* 1.09 1.01  CALCIUM 9.1 8.2* 7.7*    CBG: No results found for this basename: GLUCAP,  in the last 168 hours  GFR Estimated Creatinine Clearance: 63.4 ml/min (by C-G formula based on Cr of 1.01).  Coagulation profile  Recent Labs Lab 09/18/12 1338 09/19/12 0550  INR 1.51* 1.58*    Cardiac Enzymes No results found for this basename: CK, CKMB, TROPONINI, MYOGLOBIN,  in the last 168 hours  No components found with this basename: POCBNP,  No results found for this basename: DDIMER,  in the last 72 hours No results found for  this basename: HGBA1C,  in the last 72 hours No results found for this basename: CHOL, HDL, LDLCALC, TRIG, CHOLHDL, LDLDIRECT,  in the last 72 hours No results found for this basename: TSH, T4TOTAL, FREET3, T3FREE, THYROIDAB,  in the last 72 hours No results found for this basename: VITAMINB12, FOLATE, FERRITIN, TIBC, IRON, RETICCTPCT,  in the last 72 hours No results found for this basename: LIPASE, AMYLASE,  in the last 72 hours  Urine Studies No results found for this basename: UACOL, UAPR, USPG, UPH, UTP, UGL, UKET, UBIL, UHGB, UNIT, UROB, ULEU, UEPI, UWBC, URBC, UBAC, CAST, CRYS, UCOM, BILUA,  in the last 72 hours  MICROBIOLOGY: No results found for this or any previous visit (from the past 240 hour(s)).  RADIOLOGY STUDIES/RESULTS: Ct Abdomen Pelvis W Contrast  09/18/2012  *RADIOLOGY REPORT*  Clinical Data: 67 year old female with abdominal pelvic pain and rectal bleeding.  CT ABDOMEN AND PELVIS WITH CONTRAST  Technique:  Multidetector CT imaging of the abdomen and pelvis was performed following the standard protocol during bolus administration of intravenous contrast.  Contrast: OMNIPAQUE IOHEXOL 300 MG/ML  SOLN  Comparison: 08/24/2012 and 01/20/2012 CTs  Findings: Cardiomegaly and coronary artery calcifications again identified.  Hepatomegaly and fatty infiltration of the liver again identified. A 2 cm lesion within the left liver (image 19) has not changed since 2013. Gallstones versus sludge noted.  There is no CT evidence of acute cholecystitis. The spleen, adrenal glands, pancreas 10 kidneys are unremarkable.  Postoperative changes within the anterior abdominal/pelvic wall noted without evidence of recurrent hernia. Colonic diverticulosis identified without diverticulitis. A probable uterine fibroid is noted. The bladder is within normal limits. No free fluid, enlarged lymph nodes, biliary dilation or abdominal aortic aneurysm identified.  No acute or suspicious bony abnormalities  identified.  Degenerative changes within the lumbar spine identified.  IMPRESSION: No evidence of acute abnormality.  Hepatomegaly and fatty infiltration of the liver.  Probable left hepatic hemangioma.  Postoperative changes within the anterior abdominal/pelvic wall without evidence of recurrent hernia.  Cardiomegaly and coronary artery disease.  Colonic diverticulosis and probable uterine fibroid.   Original Report Authenticated By: Harmon Pier, M.D.    Ct Abdomen Pelvis W Contrast  08/24/2012  *RADIOLOGY REPORT*  Clinical Data: Recent history of exploratory laparoscopy for evaluation of small bowel obstruction and primary ventral hernia repair.  Question recurrent hernia.  Blood per rectum.  CT ABDOMEN AND PELVIS WITH CONTRAST  Technique:  Multidetector CT imaging of the abdomen and pelvis was performed following the standard protocol during bolus administration of intravenous contrast.  Contrast: OMNIPAQUE IOHEXOL 300 MG/ML  SOLN  Comparison: CT abdomen pelvis of 01/20/2012  Findings: The lung bases are clear.  The liver is low attenuation consistent with severe diffuse fatty infiltration.  There is there is inhomogeneous appearance of this fatty infiltration within the right  lobe of liver anteriorly most consistent with focal fatty deposition.  Several enhancing lesions are present in both the left and right lobes consistent with hemangiomas.  No ductal dilatation is seen.  No calcified gallstones are noted.  The pancreas is normal in size and the pancreatic duct is not dilated.  The adrenal glands and spleen are unremarkable.  The stomach is not well distended.  The kidneys enhance with no calculus or mass and no hydronephrosis is seen.  The abdominal aorta is normal in caliber.  There is a small ventral hernia within the upper abdomen anterior to the left lobe of liver containing only fat.  However, at the site of the prior repair there is some fat within the soft tissues in the midline presumably  postoperative, but no definite defect in the anterior abdominal wall is seen.  There is strandiness of the soft tissues extending into the pelvis consistent with scarring.  The uterus is normal in size although somewhat nodular consistent with at least one uterine fibroid to the left of midline.  The urinary bladder is decompressed and cannot be evaluated.  No fluid is seen within the pelvis.  The ovaries contain small cysts but are otherwise unremarkable.  Multiple rectosigmoid colonic diverticula are noted.  No other abnormality of the colon is seen.  The terminal ileum is unremarkable.  Degenerative disc disease is present primarily at L3-4 and L4-5 levels.  IMPRESSION:  1.  Only a small ventral hernia is noted in the upper abdomen anterior to the left lobe of liver containing only fat. 2.  There is some fat and strandiness at the site of prior repair of ventral hernia, but no recurrent hernia at that site is seen. 3.  Severe fatty infiltration of liver with focal fat deposition as noted above.  Also there are several enhancing lesions within both the left and right lobe of liver consistent with multiple hemangiomas. 4.  Probable uterine fibroid.  5.  Multiple rectosigmoid colonic diverticula   Original Report Authenticated By: Dwyane Dee, M.D.     MEDICATIONS: Scheduled Meds: . acetaminophen  650 mg Oral Once  . diphenhydrAMINE  25 mg Intravenous Once  . furosemide  20 mg Intravenous Once  . influenza  inactive virus vaccine  0.5 mL Intramuscular Tomorrow-1000  . sodium chloride  3 mL Intravenous Q12H   Continuous Infusions: . sodium chloride 1,000 mL (09/18/12 1438)  . sodium chloride    . pantoprozole (PROTONIX) infusion 8 mg/hr (09/18/12 2112)   PRN Meds:.ALPRAZolam, morphine injection, oxyCODONE  Antibiotics: Anti-infectives   None       Jeoffrey Massed, MD  [facility] Pager:[phone]  If 7PM-7AM, please contact night-coverage [email or web address] Password  [email or web address] 09/19/2012, 9:15 AM   LOS: 1 day

## 2012-09-19 NOTE — Op Note (Signed)
[facility] [address] [place], [address]   OPERATIVE PROCEDURE REPORT  PATIENT :[name] [name]  MR#: [id number] BIRTHDATE :[birth year] GENDER: Female ENDOSCOPIST: Dr.  Lorenza Burton, MD ASSISTANT:   Claudie Revering, RN [facility] Windell Hummingbird, technician PROCEDURE DATE: Oct 18, 2012 PRE-PROCEDURE PREPERATION: Patient fasted for 8 hours prior to the procedure. PRE-PROCEDURE PHYSICAL: Patient has stable vital signs.  Neck is supple.  There is no JVD, thyromegaly or LAD.  Chest clear to auscultation.  S1 and S2 regular.  Abdomen soft, morbidly obese, non-distended, non-tender with NABS. PROCEDURE:     EGD, diagnostic ASA CLASS:     Class III INDICATIONS:     Hematochezia, melena, and Iron deficiency anemia.  MEDICATIONS:     Fentanyl 50 mcg & Versed 4 mg IV TOPICAL ANESTHETIC:   Cetacaine Spray x 2.  DESCRIPTION OF PROCEDURE: After the risks benefits and alternatives of the procedure were thoroughly explained, informed consent was obtained.  The Pentax Gastroscope [id number]  was introduced through the mouth and advanced to the second portion of the duodenum , without limitations. The instrument was slowly withdrawn as the mucosa was fully examined.   The esophagus and the GEJ were widely patent and appeared normal. Severe diffuse gastritis was noted throughout the stomach with no evidence of fresh or old heme in the upper GI tract. Moderate duodenitis was also noted in the proximal duodenum. There were no ulcers, erosions, masses or polyps noted. Retroflexed views revealed no abnormalities. The scope was then withdrawn from the patient and the procedure terminated. The patient tolerated the procedure without immediate complications.  IMPRESSION:  1) Normal appearing esophagus and GEJ. 2) Severe diffuse gastritis. 3) Moderate duodenitis in the proximal small bowel. RECOMMENDATIONS:     . Anti-reflux regimen to be followed. 2.  Continue PPI's.  Avoid NSAIDS. 3.  Will prep for a colonoscopy tomorrow.  REPEAT EXAM:  None planned for now.  DISCHARGE INSTRUCTIONS: standard discharge.  _______________________________ eSigned:  Dr. Lorenza Burton, MD 10-18-12 9:56 AM   CPT CODES:     43235, EGD  DIAGNOSIS CODES:     569.3 Hematochezia 280.9 Iron Deficiency Anemia    CC: Canary Brim  PATIENT NAME:  [name] [name], [name] [name] MR#: [id number]

## 2012-09-19 NOTE — ED Provider Notes (Signed)
Medical screening examination/treatment/procedure(s) were conducted as a shared visit with non-physician practitioner(s) and myself.  I personally evaluated the patient during the encounter  Doug Sou, MD 09/19/12 [phone]

## 2012-09-20 ENCOUNTER — Encounter (HOSPITAL_COMMUNITY)
Admission: EM | Disposition: A | Discharge status: Home / Self Care | Payer: Self-pay | Source: Home / Self Care | Attending: Internal Medicine

## 2012-09-20 HISTORY — PX: COLONOSCOPY: SHX5424

## 2012-09-20 LAB — BASIC METABOLIC PANEL
BUN: 21 mg/dL (ref 6–23)
GFR calc Af Amer: 58 mL/min — ABNORMAL LOW (ref >90)
GFR calc non Af Amer: 50 mL/min — ABNORMAL LOW (ref >90)
Potassium: 3.9 mEq/L (ref 3.5–5.1)
Sodium: 134 mEq/L — ABNORMAL LOW (ref 135–145)

## 2012-09-20 LAB — CBC
HCT: 29.7 % — ABNORMAL LOW (ref 36.0–46.0)
MCH: 33.9 pg (ref 26.0–34.0)
MCV: 98.7 fL (ref 78.0–100.0)
Platelets: 96 10*3/uL — ABNORMAL LOW (ref 150–400)
RBC: 3.01 MIL/uL — ABNORMAL LOW (ref 3.87–5.11)

## 2012-09-20 LAB — TYPE AND SCREEN
ABO/RH(D): O NEG
Unit division: 0

## 2012-09-20 LAB — URINALYSIS, ROUTINE W REFLEX MICROSCOPIC
Nitrite: NEGATIVE
Protein, ur: 30 mg/dL — AB
Specific Gravity, Urine: 1.025 (ref 1.005–1.030)
Urobilinogen, UA: 0.2 mg/dL (ref 0.0–1.0)

## 2012-09-20 LAB — PROTIME-INR: INR: 1.38 (ref 0.00–1.49)

## 2012-09-20 SURGERY — COLONOSCOPY
Anesthesia: Moderate Sedation

## 2012-09-20 MED ORDER — FENTANYL CITRATE 0.05 MG/ML IJ SOLN
INTRAMUSCULAR | Status: AC
  Filled 2012-09-20: qty 4

## 2012-09-20 MED ORDER — MIDAZOLAM HCL 5 MG/ML IJ SOLN
INTRAMUSCULAR | Status: AC
  Filled 2012-09-20: qty 2

## 2012-09-20 MED ORDER — FENTANYL CITRATE 0.05 MG/ML IJ SOLN
INTRAMUSCULAR | Status: DC | PRN
  Administered 2012-09-20 (×3): 25 ug via INTRAVENOUS

## 2012-09-20 MED ORDER — MIDAZOLAM HCL 5 MG/5ML IJ SOLN
INTRAMUSCULAR | Status: DC | PRN
  Administered 2012-09-20 (×2): 2 mg via INTRAVENOUS

## 2012-09-20 NOTE — Progress Notes (Addendum)
PATIENT DETAILS Name: [name] [name] Age: 67 y.o. Sex: female Date of Birth: October 26, 1945 Admit Date: 09/18/2012 Admitting Physician Pamella Pert, MD ZOX:[id number] Sherilyn Cooter, MD  Subjective: No further melena or hematochezia  Assessment/Plan: GI bleed-likely upper - Continue with Protonix BID - EGD on 09/19/12-showed severe diffuse gastritis - Does use aspirin and NSAIDs at home-currently on hold -colonoscopy today-pending  Acute blood loss anemia - Hemoglobin down to 8.1 on 3/15, prior baseline of around 12--transfused 2 units of PRBC on 3/15 - Hb-now upto 10.2 -since bleeding seems to have stopped-probably can stop CBC every 8 hours -recheck CBC in am  Thrombocytopenia -? 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 - Monitor CBC closely-platelets upto 96  Mild coagulopathy - ?- From fatty liver/EtOH use - Does have hypoalbuminemia-that does suggest some amount of impaired synthetic function of the liver - Received vitamin K on admission -INR on 3/16-normal at 1.38  EtOH use - Claims to drink 4-5 glasses of wine 5 days a week - No signs of withdrawal currently - Monitor for signs of withdrawal  Hyponatremia - Suspect secondary to GI loss - Sodium significantly improved with hydration - Monitor electrolytes periodically  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 - Given significant GI bleed and thrombocytopenia-we need to hold aspirin - No focal deficits on exam  History of brain aneurysm - Status post coiling  Hypertension -BP currently controlled-currently off all antihypertensive medications  Dyslipidemia - Resume statins on discharge  Anxiety - As needed Xanax  Disposition: Remain inpatient  DVT Prophylaxis: SCDs  Code Status: Full code  Procedures:  None so far  CONSULTS:  GI  PHYSICAL EXAM: Vital signs in last 24 hours: Filed Vitals:   09/19/12 2120 09/20/12 0359 09/20/12 0822  09/20/12 0826  BP: 135/76 115/70  137/78  Pulse: 83 89    Temp: 97.9 F (36.6 C) 98.3 F (36.8 C) 98.1 F (36.7 C)   TempSrc: Oral Oral Oral   Resp: 18 20  20   Height:      Weight:      SpO2: 97% 95%  98%    Weight change:  Body mass index is 34.16 kg/(m^2).   Gen Exam: Awake and alert with clear speech.  Ambulating in the room Neck: Supple, No JVD.   Chest: B/L Clear.  No rales or rhonchi CVS: S1 S2 Regular, no murmurs.  Abdomen: soft, BS +, non tender, non distended.  Extremities: no edema, lower extremities warm to touch. Neurologic: Non Focal.   Skin: No Rash.   Wounds: N/A.    Intake/Output from previous day:  Intake/Output Summary (Last 24 hours) at 09/20/12 0850 Last data filed at 09/19/12 2129  Gross per 24 hour  Intake     53 ml  Output      0 ml  Net     53 ml     LAB RESULTS: CBC  Recent Labs Lab 09/18/12 1229 09/18/12 2004 09/19/12 0550 09/19/12 1536 09/19/12 2338  WBC 7.2 5.6 3.8* 5.3 5.7  HGB 9.1* 8.4* 8.1* 9.6* 10.2*  HCT 25.6* 24.4* 23.4* 27.9* 29.7*  PLT 92* 72* 66* 91* 96*  MCV 97.0 100.0 100.9* 99.3 98.7  MCH 34.5* 34.4* 34.9* 34.2* 33.9  MCHC 35.5 34.4 34.6 34.4 34.3  RDW 15.9* 16.1* 16.2* 17.2* 18.5*    Chemistries   Recent Labs Lab 09/18/12 1229 09/18/12 2004 09/19/12 0550 09/19/12 2338  NA 129* 128* 133* 134*  K 5.0 4.9 4.2 3.9  CL  95* 98 102 101  CO2 21 22 23 21   GLUCOSE 138* 104* 98 83  BUN 28* 28* 25* 21  CREATININE 1.14* 1.09 1.01 1.13*  CALCIUM 9.1 8.2* 7.7* 8.2*    CBG: No results found for this basename: GLUCAP,  in the last 168 hours  GFR Estimated Creatinine Clearance: 56.7 ml/min (by C-G formula based on Cr of 1.13).  Coagulation profile  Recent Labs Lab 09/18/12 1338 09/19/12 0550 09/19/12 2338  INR 1.51* 1.58* 1.38    Cardiac Enzymes No results found for this basename: CK, CKMB, TROPONINI, MYOGLOBIN,  in the last 168 hours  No components found with this basename: POCBNP,  No results  found for this basename: DDIMER,  in the last 72 hours No results found for this basename: HGBA1C,  in the last 72 hours No results found for this basename: CHOL, HDL, LDLCALC, TRIG, CHOLHDL, LDLDIRECT,  in the last 72 hours No results found for this basename: TSH, T4TOTAL, FREET3, T3FREE, THYROIDAB,  in the last 72 hours No results found for this basename: VITAMINB12, FOLATE, FERRITIN, TIBC, IRON, RETICCTPCT,  in the last 72 hours No results found for this basename: LIPASE, AMYLASE,  in the last 72 hours  Urine Studies No results found for this basename: UACOL, UAPR, USPG, UPH, UTP, UGL, UKET, UBIL, UHGB, UNIT, UROB, ULEU, UEPI, UWBC, URBC, UBAC, CAST, CRYS, UCOM, BILUA,  in the last 72 hours  MICROBIOLOGY: No results found for this or any previous visit (from the past 240 hour(s)).  RADIOLOGY STUDIES/RESULTS: Ct Abdomen Pelvis W Contrast  09/18/2012  *RADIOLOGY REPORT*  Clinical Data: 67 year old female with abdominal pelvic pain and rectal bleeding.  CT ABDOMEN AND PELVIS WITH CONTRAST  Technique:  Multidetector CT imaging of the abdomen and pelvis was performed following the standard protocol during bolus administration of intravenous contrast.  Contrast: OMNIPAQUE IOHEXOL 300 MG/ML  SOLN  Comparison: 08/24/2012 and 01/20/2012 CTs  Findings: Cardiomegaly and coronary artery calcifications again identified.  Hepatomegaly and fatty infiltration of the liver again identified. A 2 cm lesion within the left liver (image 19) has not changed since 2013. Gallstones versus sludge noted.  There is no CT evidence of acute cholecystitis. The spleen, adrenal glands, pancreas 10 kidneys are unremarkable.  Postoperative changes within the anterior abdominal/pelvic wall noted without evidence of recurrent hernia. Colonic diverticulosis identified without diverticulitis. A probable uterine fibroid is noted. The bladder is within normal limits. No free fluid, enlarged lymph nodes, biliary dilation or  abdominal aortic aneurysm identified.  No acute or suspicious bony abnormalities identified.  Degenerative changes within the lumbar spine identified.  IMPRESSION: No evidence of acute abnormality.  Hepatomegaly and fatty infiltration of the liver.  Probable left hepatic hemangioma.  Postoperative changes within the anterior abdominal/pelvic wall without evidence of recurrent hernia.  Cardiomegaly and coronary artery disease.  Colonic diverticulosis and probable uterine fibroid.   Original Report Authenticated By: Harmon Pier, M.D.    Ct Abdomen Pelvis W Contrast  08/24/2012  *RADIOLOGY REPORT*  Clinical Data: Recent history of exploratory laparoscopy for evaluation of small bowel obstruction and primary ventral hernia repair.  Question recurrent hernia.  Blood per rectum.  CT ABDOMEN AND PELVIS WITH CONTRAST  Technique:  Multidetector CT imaging of the abdomen and pelvis was performed following the standard protocol during bolus administration of intravenous contrast.  Contrast: OMNIPAQUE IOHEXOL 300 MG/ML  SOLN  Comparison: CT abdomen pelvis of 01/20/2012  Findings: The lung bases are clear.  The liver is low attenuation  consistent with severe diffuse fatty infiltration.  There is there is inhomogeneous appearance of this fatty infiltration within the right lobe of liver anteriorly most consistent with focal fatty deposition.  Several enhancing lesions are present in both the left and right lobes consistent with hemangiomas.  No ductal dilatation is seen.  No calcified gallstones are noted.  The pancreas is normal in size and the pancreatic duct is not dilated.  The adrenal glands and spleen are unremarkable.  The stomach is not well distended.  The kidneys enhance with no calculus or mass and no hydronephrosis is seen.  The abdominal aorta is normal in caliber.  There is a small ventral hernia within the upper abdomen anterior to the left lobe of liver containing only fat.  However, at the site of the  prior repair there is some fat within the soft tissues in the midline presumably postoperative, but no definite defect in the anterior abdominal wall is seen.  There is strandiness of the soft tissues extending into the pelvis consistent with scarring.  The uterus is normal in size although somewhat nodular consistent with at least one uterine fibroid to the left of midline.  The urinary bladder is decompressed and cannot be evaluated.  No fluid is seen within the pelvis.  The ovaries contain small cysts but are otherwise unremarkable.  Multiple rectosigmoid colonic diverticula are noted.  No other abnormality of the colon is seen.  The terminal ileum is unremarkable.  Degenerative disc disease is present primarily at L3-4 and L4-5 levels.  IMPRESSION:  1.  Only a small ventral hernia is noted in the upper abdomen anterior to the left lobe of liver containing only fat. 2.  There is some fat and strandiness at the site of prior repair of ventral hernia, but no recurrent hernia at that site is seen. 3.  Severe fatty infiltration of liver with focal fat deposition as noted above.  Also there are several enhancing lesions within both the left and right lobe of liver consistent with multiple hemangiomas. 4.  Probable uterine fibroid.  5.  Multiple rectosigmoid colonic diverticula   Original Report Authenticated By: Dwyane Dee, M.D.     MEDICATIONS: Scheduled Meds: . pantoprazole (PROTONIX) IV  40 mg Intravenous Q12H  . sodium chloride  3 mL Intravenous Q12H   Continuous Infusions: . sodium chloride 500 mL (09/20/12 0846)   PRN Meds:.ALPRAZolam, morphine injection, oxyCODONE  Antibiotics: Anti-infectives   None       Jeoffrey Massed, MD  [facility] Pager:[phone]  If 7PM-7AM, please contact night-coverage [email or web address] Password [email or web address] 09/20/2012, 8:50 AM   LOS: 2 days

## 2012-09-20 NOTE — Op Note (Signed)
[facility] [address] [place], [address]   OPERATIVE PROCEDURE REPORT  PATIENT: [name] [name], [name] [name]  MR#: [id number] BIRTHDATE: 1946-05-16 GENDER: Female ENDOSCOPIST:  Lorenza Burton, MD ASSISTANT:   Jeneen Montgomery, RN & Windell Hummingbird, technician PROCEDURE DATE: 09/20/2012 PRE-PROCEDURE PREPARATION: The patient was prepped with and a gallon of Golytely the night prior to the procedure.  The patient was fasted for 8 hours prior to the procedure. PRE-PROCEDURE PHYSICAL: Patient has stable vital signs.  Neck is supple.  There is no JVD, thyromegaly or LAD.  Chest clear to auscultation.  S1 and S2 regular.  Abdomen soft, morbidly obese, non-distended, non-tender with NABS. PROCEDURE:     Colonoscopy, diagnostic ASA CLASS:     Class III INDICATIONS:     1.Rectal bleeding [hematochezia and melena] 2. Iron deficiency anemia [requiring blood transfusions emergently] 3. CRC screening. MEDICATIONS:     Fentanyl 75 mcg and Versed 4 mg IV.  DESCRIPTION OF PROCEDURE: After the risks, benefits, and alternatives of the procedure were thoroughly explained [including a 10% missed rate of cancer and polyps], informed consent was obtained. Digital rectal exam was performed. The Pentax Colonoscope [id number]  was introduced through the anus  and advanced to the cecum, which was identified by both the appendix and ileocecal valve , limited by No adverse events experienced.  The quality of the prep was fairly good. Multiple washes were done. Small lesions could be missed. The instrument was then slowly withdrawn as the colon was fully examined.   COLON FINDINGS: Moderate diverticulosis was noted in the sigmoid colon. The entire colonic mucosa appeared healthy with a normal vascular pattern. No masses, polyps or AVMs were noted.  The appendiceal orifice and the ICV were identified and photographed. The terminal ileum appeared normal but was visualized only upto 3-4 cm.   Retroflexed views revealed small internal hemorrhoids. The patient tolerated the procedure without immediate complications. The scope was then withdrawn from the patient and the procedure terminated.  TIME TO CECUM:   4 minutes 0 seconds WITHDRAW TIME:  7 minutes 0 seconds  IMPRESSION:     Small internal hemorrhoids and moderate diverticulosis was noted in the sigmoid colon; otherwise normal colonoscopy.  RECOMMENDATIONS:     1.  Continue current medications. 2.  Continue surveillance. 3. Restart Aspirin 81 mg as the patient has a colied aneurysm. 4. Consider a capsule study tomorrow.  REPEAT EXAM:      for colonoscopy in 10 years. If the patient has any abnormal GI symptoms in the interim, she/he have been advised to contact the office as soon as possible for further recommendations.   CPT CODES:     [id number]  DIAGNOSIS CODES:     569.3, 280.9, V76.51  CC : Canary Brim, M.D. eSigned:  Dr. Lorenza Burton, MD 09/20/2012 9:33 AM

## 2012-09-21 ENCOUNTER — Encounter (HOSPITAL_COMMUNITY)
Admission: EM | Disposition: A | Discharge status: Home / Self Care | Payer: Self-pay | Source: Home / Self Care | Attending: Internal Medicine

## 2012-09-21 ENCOUNTER — Encounter (HOSPITAL_COMMUNITY): Payer: Self-pay | Admitting: Gastroenterology

## 2012-09-21 DIAGNOSIS — F1010 Alcohol abuse, uncomplicated: Secondary | ICD-10-CM

## 2012-09-21 DIAGNOSIS — D649 Anemia, unspecified: Secondary | ICD-10-CM

## 2012-09-21 DIAGNOSIS — K2970 Gastritis, unspecified, without bleeding: Secondary | ICD-10-CM

## 2012-09-21 HISTORY — PX: GIVENS CAPSULE STUDY: SHX5432

## 2012-09-21 LAB — CBC
MCV: 98.2 fL (ref 78.0–100.0)
Platelets: 108 10*3/uL — ABNORMAL LOW (ref 150–400)
RDW: 18.7 % — ABNORMAL HIGH (ref 11.5–15.5)
WBC: 5.7 10*3/uL (ref 4.0–10.5)

## 2012-09-21 SURGERY — IMAGING PROCEDURE, GI TRACT, INTRALUMINAL, VIA CAPSULE
Anesthesia: LOCAL

## 2012-09-21 MED ORDER — HYDROCORTISONE ACETATE 25 MG RE SUPP: 25.0000 mg | Freq: Two times a day (BID) | RECTAL | Status: DC | PRN

## 2012-09-21 MED ORDER — PANTOPRAZOLE SODIUM 40 MG PO TBEC
40.0000 mg | DELAYED_RELEASE_TABLET | Freq: Two times a day (BID) | ORAL | Status: DC
  Administered 2012-09-21 – 2012-09-22 (×2): 40 mg via ORAL
  Filled 2012-09-21 (×2): qty 1

## 2012-09-21 SURGICAL SUPPLY — 1 items: TOWEL COTTON PACK 4EA (MISCELLANEOUS) ×4 IMPLANT

## 2012-09-21 NOTE — Progress Notes (Signed)
PATIENT DETAILS Name: [name] [name] Age: 67 y.o. Sex: female Date of Birth: 01-Jan-1946 Admit Date: 09/18/2012 Admitting Physician Pamella Pert, MD JXB:[id number] Sherilyn Cooter, MD  Subjective: No further melena or hematochezia-doing well-no major complaints   Assessment/Plan: GI bleed-likely upper -seems to have resolved - Continue with Protonix BID  - EGD on 09/19/12-showed severe diffuse gastritis -Colonoscopy on 3/16-positive for Diverticulosis - Does use aspirin and NSAIDs at home-currently on hold -Capsule Endoscopy today  Acute blood loss anemia - Hemoglobin down to 8.1 on 3/15, prior baseline of around 12--transfused 2 units of PRBC on 3/15 - Hb-slightly decreases at 9.6 -since bleeding seems to have stopped-probably can stop CBC every 8 hours -recheck CBC in am  Thrombocytopenia -? 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 - Monitor CBC closely-platelets upto 108  Mild coagulopathy - ?- From fatty liver/EtOH use - Does have hypoalbuminemia-that does suggest some amount of impaired synthetic function of the liver - Received vitamin K on admission -INR on 3/16-normal at 1.38  EtOH use - Claims to drink 4-5 glasses of wine 5 days a week - No signs of withdrawal currently - Monitor for signs of withdrawal  Hyponatremia - Suspect secondary to GI loss - Sodium significantly improved with hydration - Monitor electrolytes periodically  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 - Given significant GI bleed and thrombocytopenia-we need to hold aspirin-till Capsule Endo results come in - No focal deficits on exam  History of brain aneurysm - Status post coiling  Hypertension -BP currently controlled-currently off all antihypertensive medications  Dyslipidemia - Resume statins on discharge  Anxiety - As needed Xanax  Disposition: Remain inpatient-likely home in am  DVT  Prophylaxis: SCDs  Code Status: Full code  Procedures:  None so far  CONSULTS:  GI  PHYSICAL EXAM: Vital signs in last 24 hours: Filed Vitals:   09/20/12 0936 09/20/12 1418 09/20/12 2127 09/21/12 0511  BP: 121/69 143/82 132/81 126/70  Pulse:  89 90 88  Temp:  98.6 F (37 C) 99 F (37.2 C) 98.6 F (37 C)  TempSrc:  Oral Oral Oral  Resp: 17 18 20 18   Height:      Weight:      SpO2: 94% 96% 97% 97%    Weight change:  Body mass index is 34.16 kg/(m^2).   Gen Exam: Awake and alert with clear speech.  Ambulating in the room. Husband at bedside Neck: Supple, No JVD.   Chest: B/L Clear.  No rales or rhonchi CVS: S1 S2 Regular, no murmurs.  Abdomen: soft, BS +, non tender, non distended.  Extremities: no edema, lower extremities warm to touch. Neurologic: Non Focal.   Skin: No Rash.   Wounds: N/A.    Intake/Output from previous day:  Intake/Output Summary (Last 24 hours) at 09/21/12 1046 Last data filed at 09/21/12 0825  Gross per 24 hour  Intake      0 ml  Output      0 ml  Net      0 ml     LAB RESULTS: CBC  Recent Labs Lab 09/18/12 2004 09/19/12 0550 09/19/12 1536 09/19/12 2338 09/21/12 0622  WBC 5.6 3.8* 5.3 5.7 5.7  HGB 8.4* 8.1* 9.6* 10.2* 9.6*  HCT 24.4* 23.4* 27.9* 29.7* 27.7*  PLT 72* 66* 91* 96* 108*  MCV 100.0 100.9* 99.3 98.7 98.2  MCH 34.4* 34.9* 34.2* 33.9 34.0  MCHC 34.4 34.6 34.4 34.3 34.7  RDW 16.1* 16.2* 17.2* 18.5* 18.7*  Chemistries   Recent Labs Lab 09/18/12 1229 09/18/12 2004 09/19/12 0550 09/19/12 2338  NA 129* 128* 133* 134*  K 5.0 4.9 4.2 3.9  CL 95* 98 102 101  CO2 21 22 23 21   GLUCOSE 138* 104* 98 83  BUN 28* 28* 25* 21  CREATININE 1.14* 1.09 1.01 1.13*  CALCIUM 9.1 8.2* 7.7* 8.2*    CBG: No results found for this basename: GLUCAP,  in the last 168 hours  GFR Estimated Creatinine Clearance: 56.7 ml/min (by C-G formula based on Cr of 1.13).  Coagulation profile  Recent Labs Lab 09/18/12 1338  09/19/12 0550 09/19/12 2338  INR 1.51* 1.58* 1.38    Cardiac Enzymes No results found for this basename: CK, CKMB, TROPONINI, MYOGLOBIN,  in the last 168 hours  No components found with this basename: POCBNP,  No results found for this basename: DDIMER,  in the last 72 hours No results found for this basename: HGBA1C,  in the last 72 hours No results found for this basename: CHOL, HDL, LDLCALC, TRIG, CHOLHDL, LDLDIRECT,  in the last 72 hours No results found for this basename: TSH, T4TOTAL, FREET3, T3FREE, THYROIDAB,  in the last 72 hours No results found for this basename: VITAMINB12, FOLATE, FERRITIN, TIBC, IRON, RETICCTPCT,  in the last 72 hours No results found for this basename: LIPASE, AMYLASE,  in the last 72 hours  Urine Studies No results found for this basename: UACOL, UAPR, USPG, UPH, UTP, UGL, UKET, UBIL, UHGB, UNIT, UROB, ULEU, UEPI, UWBC, URBC, UBAC, CAST, CRYS, UCOM, BILUA,  in the last 72 hours  MICROBIOLOGY: No results found for this or any previous visit (from the past 240 hour(s)).  RADIOLOGY STUDIES/RESULTS: Ct Abdomen Pelvis W Contrast  09/18/2012  *RADIOLOGY REPORT*  Clinical Data: 67 year old female with abdominal pelvic pain and rectal bleeding.  CT ABDOMEN AND PELVIS WITH CONTRAST  Technique:  Multidetector CT imaging of the abdomen and pelvis was performed following the standard protocol during bolus administration of intravenous contrast.  Contrast: OMNIPAQUE IOHEXOL 300 MG/ML  SOLN  Comparison: 08/24/2012 and 01/20/2012 CTs  Findings: Cardiomegaly and coronary artery calcifications again identified.  Hepatomegaly and fatty infiltration of the liver again identified. A 2 cm lesion within the left liver (image 19) has not changed since 2013. Gallstones versus sludge noted.  There is no CT evidence of acute cholecystitis. The spleen, adrenal glands, pancreas 10 kidneys are unremarkable.  Postoperative changes within the anterior abdominal/pelvic wall noted  without evidence of recurrent hernia. Colonic diverticulosis identified without diverticulitis. A probable uterine fibroid is noted. The bladder is within normal limits. No free fluid, enlarged lymph nodes, biliary dilation or abdominal aortic aneurysm identified.  No acute or suspicious bony abnormalities identified.  Degenerative changes within the lumbar spine identified.  IMPRESSION: No evidence of acute abnormality.  Hepatomegaly and fatty infiltration of the liver.  Probable left hepatic hemangioma.  Postoperative changes within the anterior abdominal/pelvic wall without evidence of recurrent hernia.  Cardiomegaly and coronary artery disease.  Colonic diverticulosis and probable uterine fibroid.   Original Report Authenticated By: Harmon Pier, M.D.    Ct Abdomen Pelvis W Contrast  08/24/2012  *RADIOLOGY REPORT*  Clinical Data: Recent history of exploratory laparoscopy for evaluation of small bowel obstruction and primary ventral hernia repair.  Question recurrent hernia.  Blood per rectum.  CT ABDOMEN AND PELVIS WITH CONTRAST  Technique:  Multidetector CT imaging of the abdomen and pelvis was performed following the standard protocol during bolus administration of intravenous contrast.  Contrast: OMNIPAQUE IOHEXOL 300 MG/ML  SOLN  Comparison: CT abdomen pelvis of 01/20/2012  Findings: The lung bases are clear.  The liver is low attenuation consistent with severe diffuse fatty infiltration.  There is there is inhomogeneous appearance of this fatty infiltration within the right lobe of liver anteriorly most consistent with focal fatty deposition.  Several enhancing lesions are present in both the left and right lobes consistent with hemangiomas.  No ductal dilatation is seen.  No calcified gallstones are noted.  The pancreas is normal in size and the pancreatic duct is not dilated.  The adrenal glands and spleen are unremarkable.  The stomach is not well distended.  The kidneys enhance with no calculus  or mass and no hydronephrosis is seen.  The abdominal aorta is normal in caliber.  There is a small ventral hernia within the upper abdomen anterior to the left lobe of liver containing only fat.  However, at the site of the prior repair there is some fat within the soft tissues in the midline presumably postoperative, but no definite defect in the anterior abdominal wall is seen.  There is strandiness of the soft tissues extending into the pelvis consistent with scarring.  The uterus is normal in size although somewhat nodular consistent with at least one uterine fibroid to the left of midline.  The urinary bladder is decompressed and cannot be evaluated.  No fluid is seen within the pelvis.  The ovaries contain small cysts but are otherwise unremarkable.  Multiple rectosigmoid colonic diverticula are noted.  No other abnormality of the colon is seen.  The terminal ileum is unremarkable.  Degenerative disc disease is present primarily at L3-4 and L4-5 levels.  IMPRESSION:  1.  Only a small ventral hernia is noted in the upper abdomen anterior to the left lobe of liver containing only fat. 2.  There is some fat and strandiness at the site of prior repair of ventral hernia, but no recurrent hernia at that site is seen. 3.  Severe fatty infiltration of liver with focal fat deposition as noted above.  Also there are several enhancing lesions within both the left and right lobe of liver consistent with multiple hemangiomas. 4.  Probable uterine fibroid.  5.  Multiple rectosigmoid colonic diverticula   Original Report Authenticated By: Dwyane Dee, M.D.     MEDICATIONS: Scheduled Meds: . pantoprazole  40 mg Oral BID  . sodium chloride  3 mL Intravenous Q12H   Continuous Infusions:   PRN Meds:.ALPRAZolam, morphine injection, oxyCODONE  Antibiotics: Anti-infectives   None       Jeoffrey Massed, MD  [facility] Pager:[phone]  If 7PM-7AM, please contact  night-coverage [email or web address] Password [email or web address] 09/21/2012, 10:46 AM   LOS: 3 days

## 2012-09-21 NOTE — Progress Notes (Signed)
[facility] Daily Rounding Note 09/21/2012, 10:33 AM  SUBJECTIVE:       Feels well.  No abd apin or n/v:  Never had these.  Stool is not black  OBJECTIVE:         Vital signs in last 24 hours:    Temp:  [98.6 F (37 C)-99 F (37.2 C)] 98.6 F (37 C) (03/17 0511) Pulse Rate:  [88-90] 88 (03/17 0511) Resp:  [18-20] 18 (03/17 0511) BP: (126-143)/(70-82) 126/70 mmHg (03/17 0511) SpO2:  [96 %-97 %] 97 % (03/17 0511) Last BM Date: 09/20/12 General: obese, looks comfortable and well   Heart: rrr Chest: clear Abdomen: soft, obese, NT, ND.  BS active  Extremities: no CCE Neuro/Psych:  Pleasant, not confused.   Intake/Output from previous day:    Intake/Output this shift:    Lab Results:  Recent Labs  09/19/12 1536 09/19/12 2338 09/21/12 0622  WBC 5.3 5.7 5.7  HGB 9.6* 10.2* 9.6*  HCT 27.9* 29.7* 27.7*  PLT 91* 96* 108*   BMET  Recent Labs  09/18/12 2004 09/19/12 0550 09/19/12 2338  NA 128* 133* 134*  K 4.9 4.2 3.9  CL 98 102 101  CO2 22 23 21   GLUCOSE 104* 98 83  BUN 28* 25* 21  CREATININE 1.09 1.01 1.13*  CALCIUM 8.2* 7.7* 8.2*   LFT  Recent Labs  09/18/12 1229 09/19/12 0550  PROT 7.6 6.5  ALBUMIN 2.5* 2.1*  AST 110* 87*  ALT 31 26  ALKPHOS 168* 144*  BILITOT 1.6* 1.5*   PT/INR  Recent Labs  09/19/12 0550 09/19/12 2338  LABPROT 18.4* 16.6*  INR 1.58* 1.38   Hepatitis Panel No results found for this basename: HEPBSAG, HCVAB, HEPAIGM, HEPBIGM,  in the last 72 hours  Studies/Results: No results found.  ASSESMENT: * Melena and BRBPR.  Not acute.  Colonoscopy 09/20/12:   Small internal hemorrhoids and moderate  diverticulosis was noted in the sigmoid colon; otherwise normal  Colonoscopy. The hemorrhoids explain the rectal bleeding.  EGD 09/19/12:  1) Normal appearing esophagus and GEJ.  2) Severe diffuse gastritis.  3) Moderate duodenitis in the proximal Gastritis explains the melena.  Was taking Aleve, ASA and no PPI PTA.  Capsule  endoscopy in progress.    *  Repair of incarcerated SB, ventral hernia repair, LOA 01/2012. Has been having recurrent abd pain.   *  Chronic normocytic anemia. Dating to 01/2012. Current Hgb is within historical range from 01/2012 but down from 12.0 on 08/2012. Transfused 2 units bood in 01/2012  *  Fatty liver, hepatomegaly. ? Left hepatic hemangioma.  Gallstones vs sludge.  LFTs with AST . ALT at 177 and 42.  T bili to 1.8 Looks to me like alcoholic hepatitis.    PLAN: *  Complete CE and hopefully can be read by tomorrow PM.  Pt does not need to stay in hospital to await reading.  Could go home tomorrow AM.  Has ROV with GI Dr Christella Hartigan on 4/1 at 2 pm.  *  Daily PPI .  No NSAIDs Can resume low dose asa if clinically indicated in 2 weeks.  *  Needs to stop ETOH, drinks 20 or so glasses wine per week. She is aware she needs to abstain.  Has been drinking at this volume for about 8 months.  *  Will check hepatitis serologies, ANA. Ceruloplasmin these have never been done.  *  GI will sign off. *  Added Anusol PR, prn for  rectal bleeding.     LOS: 3 days   Jennye Moccasin  09/21/2012, 10:33 AM Pager: [phone]

## 2012-09-21 NOTE — Progress Notes (Signed)
Patient seen, examined, and I agree with the above documentation, including the assessment and plan. Gastritis seen by EGD, would check H pylori serum AB and treat if positive.  Agree with PPI, avoidance of NSAIDs, and ETOH Anusol for hemorrhoids VCE completed and not yet read.  Pt can be discharged before VCE read and has GI office visit already scheduled in a few weeks with Dr. Christella Hartigan. We will contact pt with results of video capsule study Call with questions

## 2012-09-22 ENCOUNTER — Encounter (HOSPITAL_COMMUNITY): Payer: Self-pay | Admitting: Gastroenterology

## 2012-09-22 LAB — CERULOPLASMIN: Ceruloplasmin: 23 mg/dL (ref 20–60)

## 2012-09-22 LAB — ANTI-NUCLEAR AB-TITER (ANA TITER)

## 2012-09-22 LAB — ANA: Anti Nuclear Antibody(ANA): POSITIVE — AB

## 2012-09-22 LAB — HEMOGLOBIN AND HEMATOCRIT, BLOOD: Hemoglobin: 8.4 g/dL — ABNORMAL LOW (ref 12.0–15.0)

## 2012-09-22 LAB — HEPATITIS PANEL, ACUTE: Hepatitis B Surface Ag: NEGATIVE

## 2012-09-22 MED ORDER — PANTOPRAZOLE SODIUM 40 MG PO TBEC: 40.0000 mg | DELAYED_RELEASE_TABLET | Freq: Two times a day (BID) | ORAL | Status: DC

## 2012-09-22 MED ORDER — ASPIRIN 81 MG PO TABS: 81.0000 mg | ORAL_TABLET | Freq: Every day | ORAL | Status: DC

## 2012-09-22 MED ORDER — HYDROCORTISONE ACETATE 25 MG RE SUPP: 25.0000 mg | Freq: Two times a day (BID) | RECTAL | Status: DC | PRN

## 2012-09-22 NOTE — Care Management Note (Signed)
    Page 1 of 1   09/22/2012     2:24:32 PM   CARE MANAGEMENT NOTE 09/22/2012  Patient:  [name] [name], [name] [name]   Account Number:  [id number]  Date Initiated:  09/22/2012  Documentation initiated by:  Letha Cape  Subjective/Objective Assessment:   dx severe gastritis  admit- lives with spouse.   pta indep.     Action/Plan:   Anticipated DC Date:  09/22/2012   Anticipated DC Plan:  HOME/SELF CARE      DC Planning Services  CM consult      Choice offered to / List presented to:             Status of service:  Completed, signed off Medicare Important Message given?   (If response is "NO", the following Medicare IM given date fields will be blank) Date Medicare IM given:   Date Additional Medicare IM given:    Discharge Disposition:  HOME/SELF CARE  Per UR Regulation:  Reviewed for med. necessity/level of care/duration of stay  If discussed at Long Length of Stay Meetings, dates discussed:    Comments:  09/22/12 14:23 Letha Cape RN, BSN [phone] patient lives with spouse, pta indep.  Patient for dc today, no needs anticipated.

## 2012-09-22 NOTE — Progress Notes (Signed)
NURSING PROGRESS NOTE  [name] [name] [id number] Discharge Data: 09/22/2012 11:33 AM Attending Provider: Maretta Bees, MD LKG:[id number] Sherilyn Cooter, MD     [name] to be D/C'd Home per MD order.  Discussed with the patient the After Visit Summary and all questions fully answered. All IV's discontinued with no bleeding noted. All belongings returned to patient for patient to take home.   Last Vital Signs:  Blood pressure 146/84, pulse 90, temperature 98.2 F (36.8 C), temperature source Oral, resp. rate 18, height 5\' 5"  (1.651 m), weight 90.719 kg (200 lb), SpO2 97.00%.  Discharge Medication List   Medication List    STOP taking these medications       ALEVE 220 MG tablet  Generic drug:  naproxen sodium     HAWTHORNE PO     labetalol 200 MG tablet  Commonly known as:  NORMODYNE     oxyCODONE-acetaminophen 5-325 MG per tablet  Commonly known as:  PERCOCET/ROXICET      TAKE these medications       ALPRAZolam 0.25 MG tablet  Commonly known as:  XANAX  Take 0.25 mg by mouth daily as needed. For anxiety     aspirin 81 MG tablet  Take 1 tablet (81 mg total) by mouth daily. Start 1 week from 09/22/12     benazepril 20 MG tablet  Commonly known as:  LOTENSIN  Take 20 mg by mouth daily.     hydrocortisone 25 MG suppository  Commonly known as:  ANUSOL-HC  Place 1 suppository (25 mg total) rectally 2 (two) times daily as needed for hemorrhoids.     pantoprazole 40 MG tablet  Commonly known as:  PROTONIX  Take 1 tablet (40 mg total) by mouth 2 (two) times daily.     simvastatin 40 MG tablet  Commonly known as:  ZOCOR  Take 40 mg by mouth every evening.

## 2012-09-22 NOTE — Discharge Summary (Signed)
PATIENT DETAILS Name: [name] [name] Age: 67 y.o. Sex: female Date of Birth: 10-Mar-1946 MRN: [id number]. Admit Date: 09/18/2012 Admitting Physician: Pamella Pert, MD HQI:[id number] Sherilyn Cooter, MD  Recommendations for Outpatient Follow-up:  1. Please check CBC in one week upon discharge-monitor hemoglobin and platelets 2. Please assess for the need for addition of labetalol to her antihypertensive medication-I have just restarted benazepril on discharge 3. Patient needs to be on 81 mg of aspirin-this can be started one week from the day of discharge 4. Follow capsule endoscopy results 5. Counsel on alcohol cessation  PRIMARY DISCHARGE DIAGNOSIS:  Active Problems:   HYPERLIPIDEMIA   HYPERTENSION   DIVERTICULOSIS OF COLON   CEREBROVASCULAR ACCIDENT, HX OF   Anemia due to blood loss, acute   GI bleed   Alcohol abuse   Hyponatremia   Thrombocytopenia   Elevated INR      PAST MEDICAL HISTORY: Past Medical History  Diagnosis Date  . Hypertension   . H/O brain surgery   . Aneurysm of anterior cerebral artery   . Hernia   . Anxiety   . Stroke   . Anemia   . Arthritis     DISCHARGE MEDICATIONS:   Medication List    STOP taking these medications       ALEVE 220 MG tablet  Generic drug:  naproxen sodium     HAWTHORNE PO     labetalol 200 MG tablet  Commonly known as:  NORMODYNE     oxyCODONE-acetaminophen 5-325 MG per tablet  Commonly known as:  PERCOCET/ROXICET      TAKE these medications       ALPRAZolam 0.25 MG tablet  Commonly known as:  XANAX  Take 0.25 mg by mouth daily as needed. For anxiety     aspirin 81 MG tablet  Take 1 tablet (81 mg total) by mouth daily. Start 1 week from 09/22/12     benazepril 20 MG tablet  Commonly known as:  LOTENSIN  Take 20 mg by mouth daily.     hydrocortisone 25 MG suppository  Commonly known as:  ANUSOL-HC  Place 1 suppository (25 mg total) rectally 2 (two) times daily as needed for hemorrhoids.     pantoprazole  40 MG tablet  Commonly known as:  PROTONIX  Take 1 tablet (40 mg total) by mouth 2 (two) times daily.     simvastatin 40 MG tablet  Commonly known as:  ZOCOR  Take 40 mg by mouth every evening.         BRIEF HPI:  See H&P, Labs, Consult and Test reports for all details in brief, patient was admitted for hematochezia and melena.  CONSULTATIONS:   GI  PERTINENT RADIOLOGIC STUDIES: Ct Abdomen Pelvis W Contrast  09/18/2012  *RADIOLOGY REPORT*  Clinical Data: 67 year old female with abdominal pelvic pain and rectal bleeding.  CT ABDOMEN AND PELVIS WITH CONTRAST  Technique:  Multidetector CT imaging of the abdomen and pelvis was performed following the standard protocol during bolus administration of intravenous contrast.  Contrast: OMNIPAQUE IOHEXOL 300 MG/ML  SOLN  Comparison: 08/24/2012 and 01/20/2012 CTs  Findings: Cardiomegaly and coronary artery calcifications again identified.  Hepatomegaly and fatty infiltration of the liver again identified. A 2 cm lesion within the left liver (image 19) has not changed since 2013. Gallstones versus sludge noted.  There is no CT evidence of acute cholecystitis. The spleen, adrenal glands, pancreas 10 kidneys are unremarkable.  Postoperative changes within the anterior abdominal/pelvic wall noted without evidence of recurrent hernia.  Colonic diverticulosis identified without diverticulitis. A probable uterine fibroid is noted. The bladder is within normal limits. No free fluid, enlarged lymph nodes, biliary dilation or abdominal aortic aneurysm identified.  No acute or suspicious bony abnormalities identified.  Degenerative changes within the lumbar spine identified.  IMPRESSION: No evidence of acute abnormality.  Hepatomegaly and fatty infiltration of the liver.  Probable left hepatic hemangioma.  Postoperative changes within the anterior abdominal/pelvic wall without evidence of recurrent hernia.  Cardiomegaly and coronary artery disease.  Colonic  diverticulosis and probable uterine fibroid.   Original Report Authenticated By: Harmon Pier, M.D.    Ct Abdomen Pelvis W Contrast  08/24/2012  *RADIOLOGY REPORT*  Clinical Data: Recent history of exploratory laparoscopy for evaluation of small bowel obstruction and primary ventral hernia repair.  Question recurrent hernia.  Blood per rectum.  CT ABDOMEN AND PELVIS WITH CONTRAST  Technique:  Multidetector CT imaging of the abdomen and pelvis was performed following the standard protocol during bolus administration of intravenous contrast.  Contrast: OMNIPAQUE IOHEXOL 300 MG/ML  SOLN  Comparison: CT abdomen pelvis of 01/20/2012  Findings: The lung bases are clear.  The liver is low attenuation consistent with severe diffuse fatty infiltration.  There is there is inhomogeneous appearance of this fatty infiltration within the right lobe of liver anteriorly most consistent with focal fatty deposition.  Several enhancing lesions are present in both the left and right lobes consistent with hemangiomas.  No ductal dilatation is seen.  No calcified gallstones are noted.  The pancreas is normal in size and the pancreatic duct is not dilated.  The adrenal glands and spleen are unremarkable.  The stomach is not well distended.  The kidneys enhance with no calculus or mass and no hydronephrosis is seen.  The abdominal aorta is normal in caliber.  There is a small ventral hernia within the upper abdomen anterior to the left lobe of liver containing only fat.  However, at the site of the prior repair there is some fat within the soft tissues in the midline presumably postoperative, but no definite defect in the anterior abdominal wall is seen.  There is strandiness of the soft tissues extending into the pelvis consistent with scarring.  The uterus is normal in size although somewhat nodular consistent with at least one uterine fibroid to the left of midline.  The urinary bladder is decompressed and cannot be evaluated.   No fluid is seen within the pelvis.  The ovaries contain small cysts but are otherwise unremarkable.  Multiple rectosigmoid colonic diverticula are noted.  No other abnormality of the colon is seen.  The terminal ileum is unremarkable.  Degenerative disc disease is present primarily at L3-4 and L4-5 levels.  IMPRESSION:  1.  Only a small ventral hernia is noted in the upper abdomen anterior to the left lobe of liver containing only fat. 2.  There is some fat and strandiness at the site of prior repair of ventral hernia, but no recurrent hernia at that site is seen. 3.  Severe fatty infiltration of liver with focal fat deposition as noted above.  Also there are several enhancing lesions within both the left and right lobe of liver consistent with multiple hemangiomas. 4.  Probable uterine fibroid.  5.  Multiple rectosigmoid colonic diverticula   Original Report Authenticated By: Dwyane Dee, M.D.      PERTINENT LAB RESULTS: CBC:  Recent Labs  09/19/12 2338 09/21/12 0622 09/22/12 0545  WBC 5.7 5.7  --   HGB 10.2*  9.6* 8.4*  HCT 29.7* 27.7* 24.9*  PLT 96* 108*  --    CMET CMP     Component Value Date/Time   NA 134* 09/19/2012 2338   K 3.9 09/19/2012 2338   CL 101 09/19/2012 2338   CO2 21 09/19/2012 2338   GLUCOSE 83 09/19/2012 2338   BUN 21 09/19/2012 2338   CREATININE 1.13* 09/19/2012 2338   CALCIUM 8.2* 09/19/2012 2338   PROT 6.5 09/19/2012 0550   ALBUMIN 2.1* 09/19/2012 0550   AST 87* 09/19/2012 0550   ALT 26 09/19/2012 0550   ALKPHOS 144* 09/19/2012 0550   BILITOT 1.5* 09/19/2012 0550   GFRNONAA 50* 09/19/2012 2338   GFRAA 58* 09/19/2012 2338    GFR Estimated Creatinine Clearance: 56 ml/min (by C-G formula based on Cr of 1.13). No results found for this basename: LIPASE, AMYLASE,  in the last 72 hours No results found for this basename: CKTOTAL, CKMB, CKMBINDEX, TROPONINI,  in the last 72 hours No components found with this basename: POCBNP,  No results found for this basename: DDIMER,   in the last 72 hours No results found for this basename: HGBA1C,  in the last 72 hours No results found for this basename: CHOL, HDL, LDLCALC, TRIG, CHOLHDL, LDLDIRECT,  in the last 72 hours No results found for this basename: TSH, T4TOTAL, FREET3, T3FREE, THYROIDAB,  in the last 72 hours No results found for this basename: VITAMINB12, FOLATE, FERRITIN, TIBC, IRON, RETICCTPCT,  in the last 72 hours Coags:  Recent Labs  09/19/12 2338  INR 1.38   Microbiology: No results found for this or any previous visit (from the past 240 hour(s)).   BRIEF HOSPITAL COURSE:   Active Problems: GI bleed - Patient had complaints of both black stools and fresh blood per rectum - She was admitted to a regular bed, started on PPI infusion and gastroenterology was consulted. - She was transfused 2 units of PRBC during this admission. - She apparently has a long-standing history of EtOH use and also is taking nonsteroidal anti-inflammatory medications at home-presumably her gastritis is from this. - Obviously both aspirin and nonsteroidal anti-inflammatory medications were held  -EGD on 09/19/12-showed severe diffuse gastritis  -Colonoscopy on 3/16-positive for Diverticulosis - Capsule endoscopy was done on 3/17- results are currently pending, GI as advised to go ahead and discharge her and they will followup the capsule endoscopy results. She also has a follow up appointment with gastroenterology as an outpatient. - She will be discharged on PPI twice a day. - On the day of discharge, hemoglobin did drop to 8.4, however she has had no further melena and hematochezia for almost 3 days now, this.is thought to be from equilibration-rather than a recurrence of her GI Bleed - I've asked her to get a CBC checked when she follows up with her PCP next week   Acute blood loss anemia  - Hemoglobin down to 8.1 on 3/15, prior baseline of around 12--transfused 2 units of PRBC on 3/15 - Patient will need a CBC checked in  one week, I have asked her to make an appointment with her primary care practitioner -On the day of discharge, hemoglobin did drop to 8.4, however she has had no further melena and hematochezia for almost 3 days now, this.is thought to be from equilibration-rather than a recurrence of her GI Bleed  Thrombocytopenia  -? 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 cirrhosis/hypersplenism  - Monitor CBC closely-platelets upto 108 by the time of discharge,  her lowest platelet count was 66 on 3/15 - Will need close monitoring of platelet counts as an outpatient  Mild coagulopathy  - ?- From fatty liver/EtOH use  - Does have hypoalbuminemia-that does suggest some amount of impaired synthetic function of the liver  - Received vitamin K on admission  -INR on 3/16-normal at 1.38  EtOH use  - Claims to drink 4-5 glasses of wine 5 days a week  - No signs of withdrawal currently  - We have counseled her extensively about the need to completely stop alcohol use, she knows to risk of developing cirrhosis-she has not had a drink for almost 5 days now, hopefully she will completely abstain from alcohol  History of ischemic CVA  - This apparently occurred in 2006  - Given significant GI bleed and thrombocytopenia-we held aspirin- I touch base with gastroenterology on the day of discharge-they recommended restarting aspirin at a lower dose of 81 mg in one week  History of brain aneurysm  - Status post coiling  Hypertension  -BP currently controlled - I am restarting her benazepril and discharge, please reassess at the next visit whether or not labetalol can be restarted as well  TODAY-DAY OF DISCHARGE:  Subjective:   Jimmey Ralph today has no headache,no chest abdominal pain,no new weakness tingling or numbness, feels much better wants to go home today.   Objective:   Blood pressure 146/84, pulse 90, temperature 98.2 F (36.8 C), temperature source Oral, resp.  rate 18, height 5\' 5"  (1.651 m), weight 90.719 kg (200 lb), SpO2 97.00%.  Intake/Output Summary (Last 24 hours) at 09/22/12 1018 Last data filed at 09/21/12 1300  Gross per 24 hour  Intake    120 ml  Output      0 ml  Net    120 ml    Exam Awake Alert, Oriented *3, No new F.N deficits, Normal affect [place].AT,PERRAL Supple Neck,No JVD, No cervical lymphadenopathy appriciated.  Symmetrical Chest wall movement, Good air movement bilaterally, CTAB RRR,No Gallops,Rubs or new Murmurs, No Parasternal Heave +ve B.Sounds, Abd Soft, Non tender, No organomegaly appriciated, No rebound -guarding or rigidity. No Cyanosis, Clubbing or edema, No new Rash or bruise  DISCHARGE CONDITION: Stable  DISPOSITION: HOME  DISCHARGE INSTRUCTIONS:    Activity:  As tolerated   Diet recommendation: Heart Healthy diet       Follow-up Information   Follow up with Rob Bunting, MD On 10/06/2012. (2 pm.  follow up bleeding, capsule endoscopy and liver issues. )    Contact information:   [address] [place] [address] [phone]       Follow up with Judie Petit, MD. Schedule an appointment as soon as possible for a visit in 1 week. (please check CBC at next visit)    Contact information:   [address] [place] [facility] [place] [address] [phone]      Total Time spent on discharge equals 45 minutes.  SignedJeoffrey Massed 09/22/2012 10:18 AM

## 2012-09-25 ENCOUNTER — Ambulatory Visit (INDEPENDENT_AMBULATORY_CARE_PROVIDER_SITE_OTHER): Payer: BC Managed Care – PPO | Admitting: Nurse Practitioner

## 2012-09-25 ENCOUNTER — Encounter: Payer: Self-pay | Admitting: Nurse Practitioner

## 2012-09-25 ENCOUNTER — Other Ambulatory Visit: Payer: Self-pay | Admitting: *Deleted

## 2012-09-25 ENCOUNTER — Other Ambulatory Visit (INDEPENDENT_AMBULATORY_CARE_PROVIDER_SITE_OTHER): Payer: BC Managed Care – PPO

## 2012-09-25 ENCOUNTER — Telehealth (INDEPENDENT_AMBULATORY_CARE_PROVIDER_SITE_OTHER): Payer: Self-pay

## 2012-09-25 VITALS — BP 80/50 | HR 76 | Ht 65.0 in | Wt 212.0 lb

## 2012-09-25 DIAGNOSIS — K922 Gastrointestinal hemorrhage, unspecified: Secondary | ICD-10-CM | POA: Insufficient documentation

## 2012-09-25 DIAGNOSIS — D62 Acute posthemorrhagic anemia: Secondary | ICD-10-CM

## 2012-09-25 DIAGNOSIS — D649 Anemia, unspecified: Secondary | ICD-10-CM

## 2012-09-25 LAB — CBC WITH DIFFERENTIAL/PLATELET
Basophils Relative: 0.6 % (ref 0.0–3.0)
Eosinophils Relative: 2.1 % (ref 0.0–5.0)
HCT: 23.6 % — CL (ref 36.0–46.0)
Hemoglobin: 7.9 g/dL — CL (ref 12.0–15.0)
Lymphs Abs: 1.4 10*3/uL (ref 0.7–4.0)
MCV: 100.9 fl — ABNORMAL HIGH (ref 78.0–100.0)
Monocytes Absolute: 1 10*3/uL (ref 0.1–1.0)
Monocytes Relative: 14.2 % — ABNORMAL HIGH (ref 3.0–12.0)
Neutro Abs: 4.6 10*3/uL (ref 1.4–7.7)
Platelets: 195 10*3/uL (ref 150.0–400.0)
WBC: 7.2 10*3/uL (ref 4.5–10.5)

## 2012-09-25 NOTE — Telephone Encounter (Signed)
Called pt today after I received a call from [facility] w/ Dr Christella Hartigan about canceling the appt scheduled for 10/06/12 to see him after she was discharged from the hospital. The pt got scoped by Dr Loreta Ave in the hospital b/c she was the doctor on call so Dr Christella Hartigan was going to cancel the appt now. I notified pt about the cancelation of Dr Christella Hartigan but advised pt that she needs to be followed by Dr Loreta Ave. The pt received a phone call from [facility] today about another appt being made for pt to be seen today by Willette Cluster at 2:30. I advised pt that she needed to keep the appt today b/c I just want her being seen by GI for follow up. The pt knows to call us if any questions.

## 2012-09-25 NOTE — Patient Instructions (Addendum)
We need you to come to our lab, basement level. [facility], on Monday 09-28-2012 at 7:30 am.   We scheduled the Enteroscopy with Dr. Rob Bunting on Monday  09-28-2012 at [facility]. Directions provided.

## 2012-09-25 NOTE — Progress Notes (Addendum)
09/25/2012 [name] [name] [id number] [birth year]   History of Present Illness:  Patient is a 67 year old female who had a screening colonoscopy by Dr. Christella Hartigan in 2009. Findings included diverticulosis and small hemorrhoids. Patient was admitted to the hospital 09/18/12 with melena. She had been taking NSAIDs for chronic back pain. On admission patient was found to have a 3 g drop in her hemoglobin from a baseline of around 12. EGD revealed severe diffuse gastritis and moderate duodenitis in the proximal small bowel. Patient also had a colonoscopy revealing moderate diverticulosis and small internal hemorrhoids. While hospitalized patient have a small bowel video capsule study performed. Following that, patient was discharged home as she her bleeding had stopped. Patient worked in today because her capsule study showed bleeding in the proximal small bowel.   Patient has not had any further bleeding since prior to hospital discharge. Hemoglobin today at 7.9, it was 8.4 three days ago. Patient feels okay. She has no unusual weakness, shortness of breath or chest pain. She has not been taking NSAIDs.  Current Medications, Allergies, Past Medical History, Past Surgical History, Family History and Social History were reviewed in [employer] record.   Physical Exam: General: Well developed , white female in no acute distress Head: Normocephalic and atraumatic Eyes:  sclerae anicteric, conjunctiva pale Ears: Normal auditory acuity Lungs: Clear throughout to auscultation Heart: Regular rate and rhythm Abdomen: Soft, non tender and non distended. No masses, no hepatomegaly. Normal bowel sounds Musculoskeletal: Symmetrical with no gross deformities  Extremities: Blateral lower extremity pitting edema Neurological: Alert oriented x 4, grossly nonfocal Psychological:  Alert and cooperative. Normal mood and affect  Assessment and Recommendations:  1. Gastrointestinal hemorrhage.  Givens capsule study with blood in proximal small bowel (I do not have actual report at present). Rule out AVM. No further overt bleeding. Hgb has drifted but patient seems to be tolerating it. Patient needs small bowel enteroscopy for evaluation and treatment. Dr Christella Hartigan has arranged for this to be done on Monday (today is Friday). The benefits, risks, and potential complications of SBE discussed with patient and she agrees to proceed.  Patient will come to our lab Monday morning at 7:30 for repeat CBC. If hgb has declined further, patient may need admission to the hospital for blood transfusion and inpatient enteroscopy. Patient advised to go to the ED if she has any overt bleeding over the weekend. She would benefit from   2. Gastritis, severe on recent EGD. She is positive for H.Pylori. Treatment can be addressed after resolution of #1.   3. Anemia, macrocytic. Patient gives history of chronic intermittent GI bleeding but interestingly her MCV has gone from 81 to 100.9 over last several months. Anemia may be mixed, not just related to blood loss. ETOH contributing?  4. Hepatomegaly / fatty liver by CTscan. We discussed fatty liver including basic treatment (weight loss, triglyceride control, ETOH, etc..)  5. Hypotension. Blood pressure 80/50. Patient asymptomatic. She is on Lotensin which I asked her to hold until after her small bowel enteroscopy. I will forward a copy of my note to Dr. Cato Mulligan, her PCP, regarding Lotensin.

## 2012-09-28 ENCOUNTER — Other Ambulatory Visit: Payer: Self-pay

## 2012-09-28 ENCOUNTER — Ambulatory Visit: Payer: BC Managed Care – PPO

## 2012-09-28 ENCOUNTER — Encounter (HOSPITAL_COMMUNITY): Payer: Self-pay | Admitting: *Deleted

## 2012-09-28 ENCOUNTER — Observation Stay (HOSPITAL_COMMUNITY)
Admission: RE | Admit: 2012-09-28 | Discharge: 2012-09-29 | Disposition: A | Discharge status: Home / Self Care | Payer: BC Managed Care – PPO | Source: Ambulatory Visit | Attending: Gastroenterology | Admitting: Gastroenterology

## 2012-09-28 ENCOUNTER — Encounter (HOSPITAL_COMMUNITY)
Admission: RE | Disposition: A | Discharge status: Home / Self Care | Payer: Self-pay | Source: Ambulatory Visit | Attending: Gastroenterology

## 2012-09-28 ENCOUNTER — Encounter: Payer: Self-pay | Admitting: Gastroenterology

## 2012-09-28 DIAGNOSIS — D62 Acute posthemorrhagic anemia: Secondary | ICD-10-CM

## 2012-09-28 DIAGNOSIS — R609 Edema, unspecified: Secondary | ICD-10-CM | POA: Insufficient documentation

## 2012-09-28 DIAGNOSIS — K922 Gastrointestinal hemorrhage, unspecified: Secondary | ICD-10-CM

## 2012-09-28 DIAGNOSIS — G8929 Other chronic pain: Secondary | ICD-10-CM | POA: Insufficient documentation

## 2012-09-28 DIAGNOSIS — K222 Esophageal obstruction: Secondary | ICD-10-CM | POA: Insufficient documentation

## 2012-09-28 DIAGNOSIS — K7689 Other specified diseases of liver: Secondary | ICD-10-CM | POA: Insufficient documentation

## 2012-09-28 DIAGNOSIS — K2970 Gastritis, unspecified, without bleeding: Secondary | ICD-10-CM | POA: Insufficient documentation

## 2012-09-28 DIAGNOSIS — R160 Hepatomegaly, not elsewhere classified: Secondary | ICD-10-CM | POA: Insufficient documentation

## 2012-09-28 DIAGNOSIS — D649 Anemia, unspecified: Secondary | ICD-10-CM

## 2012-09-28 DIAGNOSIS — I10 Essential (primary) hypertension: Secondary | ICD-10-CM

## 2012-09-28 DIAGNOSIS — K269 Duodenal ulcer, unspecified as acute or chronic, without hemorrhage or perforation: Secondary | ICD-10-CM | POA: Insufficient documentation

## 2012-09-28 DIAGNOSIS — D539 Nutritional anemia, unspecified: Secondary | ICD-10-CM | POA: Insufficient documentation

## 2012-09-28 DIAGNOSIS — M549 Dorsalgia, unspecified: Secondary | ICD-10-CM | POA: Insufficient documentation

## 2012-09-28 DIAGNOSIS — I959 Hypotension, unspecified: Secondary | ICD-10-CM | POA: Insufficient documentation

## 2012-09-28 HISTORY — PX: ENTEROSCOPY: SHX5533

## 2012-09-28 LAB — CBC WITH DIFFERENTIAL/PLATELET
Basophils Absolute: 0.1 10*3/uL (ref 0.0–0.1)
Basophils Relative: 2 % — ABNORMAL HIGH (ref 0–1)
Eosinophils Absolute: 0.2 10*3/uL (ref 0.0–0.7)
Eosinophils Relative: 3 % (ref 0–5)
HCT: 21.6 % — ABNORMAL LOW (ref 36.0–46.0)
MCH: 31.3 pg (ref 26.0–34.0)
MCHC: 32.9 g/dL (ref 30.0–36.0)
MCV: 95.2 fL (ref 78.0–100.0)
Monocytes Absolute: 0.5 10*3/uL (ref 0.1–1.0)
Platelets: 196 10*3/uL (ref 150–400)
RDW: 17.5 % — ABNORMAL HIGH (ref 11.5–15.5)
WBC: 6.3 10*3/uL (ref 4.0–10.5)

## 2012-09-28 SURGERY — ENTEROSCOPY
Anesthesia: Moderate Sedation

## 2012-09-28 MED ORDER — FENTANYL CITRATE 0.05 MG/ML IJ SOLN
INTRAMUSCULAR | Status: DC | PRN
  Administered 2012-09-28 (×3): 25 ug via INTRAVENOUS

## 2012-09-28 MED ORDER — FENTANYL CITRATE 0.05 MG/ML IJ SOLN
INTRAMUSCULAR | Status: AC
  Filled 2012-09-28: qty 4

## 2012-09-28 MED ORDER — ALPRAZOLAM 0.25 MG PO TABS: 0.2500 mg | ORAL_TABLET | Freq: Every evening | ORAL | Status: DC | PRN

## 2012-09-28 MED ORDER — ONDANSETRON HCL 4 MG/2ML IJ SOLN: 4.0000 mg | Freq: Four times a day (QID) | INTRAMUSCULAR | Status: DC | PRN

## 2012-09-28 MED ORDER — BENAZEPRIL HCL 20 MG PO TABS
20.0000 mg | ORAL_TABLET | Freq: Once | ORAL | Status: AC
  Administered 2012-09-29: 20 mg via ORAL
  Filled 2012-09-28 (×2): qty 1

## 2012-09-28 MED ORDER — BUTAMBEN-TETRACAINE-BENZOCAINE 2-2-14 % EX AERO
INHALATION_SPRAY | CUTANEOUS | Status: DC | PRN
  Administered 2012-09-28: 2 via TOPICAL

## 2012-09-28 MED ORDER — SODIUM CHLORIDE 0.9 % IV SOLN
INTRAVENOUS | Status: DC
  Administered 2012-09-28: 500 mL via INTRAVENOUS

## 2012-09-28 MED ORDER — SODIUM CHLORIDE 0.9 % IV SOLN
INTRAVENOUS | Status: DC
  Administered 2012-09-29: 04:00:00 via INTRAVENOUS

## 2012-09-28 MED ORDER — ONDANSETRON HCL 4 MG PO TABS
4.0000 mg | ORAL_TABLET | Freq: Four times a day (QID) | ORAL | Status: DC | PRN
  Filled 2012-09-28: qty 1

## 2012-09-28 MED ORDER — MIDAZOLAM HCL 10 MG/2ML IJ SOLN
INTRAMUSCULAR | Status: DC | PRN
  Administered 2012-09-28 (×3): 2.5 mg via INTRAVENOUS

## 2012-09-28 MED ORDER — MIDAZOLAM HCL 10 MG/2ML IJ SOLN
INTRAMUSCULAR | Status: AC
  Filled 2012-09-28: qty 4

## 2012-09-28 MED ORDER — PANTOPRAZOLE SODIUM 40 MG PO TBEC
40.0000 mg | DELAYED_RELEASE_TABLET | Freq: Two times a day (BID) | ORAL | Status: DC
  Administered 2012-09-28 – 2012-09-29 (×2): 40 mg via ORAL
  Filled 2012-09-28 (×3): qty 1

## 2012-09-28 NOTE — OR Nursing (Signed)
Pt is being transferred to room 1527.  Report called to [facility].

## 2012-09-28 NOTE — Op Note (Signed)
[facility] [address] [place], [address]   ENDOSCOPY PROCEDURE REPORT  PATIENT: [name] [name], [name] [name]  MR#: [id number] BIRTHDATE: [birth year] , 66  yrs. old GENDER: Female ENDOSCOPIST: Rachael Fee, MD PROCEDURE DATE:  09/28/2012 PROCEDURE:  Enteroscopy ASA CLASS:     Class III INDICATIONS:  overt GI bleeding (red to dark blood) 7-10 days ago, admitted; underwent colonoscoyp and upper endoscopy Dr.  Loreta Ave and eventual SBCE; preliminary reading of capsule showed oozing site in proximal small bowel. MEDICATIONS: Fentanyl 75 mcg IV and Versed 7.5 mg IV TOPICAL ANESTHETIC: Cetacaine Spray  DESCRIPTION OF PROCEDURE: After the risks benefits and alternatives of the procedure were thoroughly explained, informed consent was obtained.  The Pentax Gastroscope [id number] endoscope was introduced through the mouth and advanced to the mid jejunum. Without limitations.  The instrument was slowly withdrawn as the mucosa was fully examined.    Using pediatric colonoscope, I was able to examine UGI tract to mid jejunum.  There was no recent or old blood.  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  There was mild to moderate proximal gastritis. There was a small linear erosion i duodenal bulb.  The small bowel mucosa distal to the duodenum was completely normal, examined extensively to approximately the mid-jejunum.  Retroflexed views revealed no abnormalities.     The scope was then withdrawn from the patient and the procedure completed. COMPLICATIONS: There were no complications. ENDOSCOPIC IMPRESSION: Using pediatric colonoscope, I was able to examine UGI tract to mid jejunum.  There was no recent or old blood.  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  There was mild to moderate proximal gastritis. There was a small linear erosion i duodenal bulb.  The small bowel mucosa distal to the duodenum was completely normal, examined extensively to approximately the  mid-jejunum.  RECOMMENDATIONS: She has not had overt bleeding since discharge 1 week ago.  Her hemoglobin however has "equilabrated" 2.5grams lower than at discharge (from 9.6 to 7.1 this AM).  She fell over the weekend, felt woozy, ended up falling down a flight of stairs.  No serious injuries.  I will be admitting her observation, 2 unit blood transfusion.  If she has repeat overt bleeding, then will plan to repeat enteroscopy.   eSigned:  Rachael Fee, MD 09/28/2012 1:53 PM

## 2012-09-28 NOTE — H&P (Signed)
Primary Care Physician:  Judie Petit, MD Primary Gastroenterologist: Rob Bunting, MD  HPI: [name] [name] is a 67 y.o. female  Hospitalized a couple of weeks ago with melena. She had been taking NSAIDs for chronic back pain. On admission patient was found to have a 3 g drop in her hemoglobin from a baseline of around 12. EGD revealed severe diffuse gastritis and moderate duodenitis in the proximal small bowel. Patient also had a colonoscopy revealing moderate diverticulosis and small internal hemorrhoids. While hospitalized patient had a small bowel video capsule study performed. Pending results of study patient was discharged home as bleeding had ceased. Patient worked into the office Friday because her capsule study showed bleeding in the proximal small bowel. Hgb in the office Friday was 7.9, it had drifted from 10.2 on 09/20/11. Patient was tolerating the hemoglobin drop and had not had any further bleeding since hospitalization. She was scheduled for small bowel enteroscopy today (Monday) for further evaluation. Patient came by office early this am to make sure hgb was stable prior to procedure. Hgb had dropped further to 7.1 without overt GI bleeding. We went ago with the enteroscopy today. The scope was advanced to mid jejunum. No bleeding was found, just a small duodenal erosion. Post-procedure patient being admitted symptomatic anemia. She is slightly SOB.     Past Medical History  Diagnosis Date  . Hypertension   . H/O brain surgery   . Aneurysm of anterior cerebral artery   . Hernia   . Anxiety   . Stroke   . Anemia   . Arthritis     Past Surgical History  Procedure Laterality Date  . Laparotomy  01/22/2012    Procedure: EXPLORATORY LAPAROTOMY;  Surgeon: Emelia Loron, MD;  Location: [facility] OR;  Service: General;  Laterality: N/A;  lysis of adhesions  . Hernia repair    . Esophagogastroduodenoscopy N/A 09/19/2012    Procedure: ESOPHAGOGASTRODUODENOSCOPY (EGD);  Surgeon:  Charna Atavia, MD;  Location: [facility] ENDOSCOPY;  Service: Endoscopy;  Laterality: N/A;  . Colonoscopy N/A 09/20/2012    Procedure: COLONOSCOPY;  Surgeon: Charna Kennetha, MD;  Location: [facility] ENDOSCOPY;  Service: Endoscopy;  Laterality: N/A;  . Givens capsule study N/A 09/21/2012    Procedure: GIVENS CAPSULE STUDY;  Surgeon: Charna Pennye, MD;  Location: [facility] ENDOSCOPY;  Service: Endoscopy;  Laterality: N/A;    Prior to Admission medications   Medication Sig Start Date End Date Taking? Authorizing Provider  ALPRAZolam (XANAX) 0.25 MG tablet Take 0.25 mg by mouth daily as needed. For anxiety   Yes Historical Provider, MD  aspirin 81 MG tablet Take 1 tablet (81 mg total) by mouth daily. Start 1 week from 09/22/12 09/22/12  Yes Shanker Levora Dredge, MD  benazepril (LOTENSIN) 20 MG tablet Take 20 mg by mouth daily.   Yes Historical Provider, MD  pantoprazole (PROTONIX) 40 MG tablet Take 1 tablet (40 mg total) by mouth 2 (two) times daily. 09/22/12  Yes Shanker Levora Dredge, MD  simvastatin (ZOCOR) 40 MG tablet Take 40 mg by mouth every evening.   Yes Historical Provider, MD  hydrocortisone (ANUSOL-HC) 25 MG suppository Place 1 suppository (25 mg total) rectally 2 (two) times daily as needed for hemorrhoids. 09/22/12   Shanker Levora Dredge, MD    Current Facility-Administered Medications  Medication Dose Route Frequency Provider Last Rate Last Dose  . 0.9 %  sodium chloride infusion   Intravenous Continuous Rachael Fee, MD 20 mL/hr at 09/28/12 1327 500 mL at 09/28/12 1327  Allergies as of 09/25/2012 - Review Complete 09/25/2012  Allergen Reaction Noted  . Cephalexin Rash   . Penicillins Rash     Family History  Problem Relation Age of Onset  . Cancer Father     pancreatic  . Cancer Maternal Grandmother     breast    History   Social History  . Marital Status: Married    Spouse Name: N/A    Number of Children: N/A  . Years of Education: N/A   Occupational History  . Not on file.   Social History Main  Topics  . Smoking status: Never Smoker   . Smokeless tobacco: Not on file  . Alcohol Use: 12.0 oz/week    20 Glasses of wine per week     Comment: daily 3-4 glasses wine day  . Drug Use: No  . Sexually Active: Yes    Birth Control/ Protection: Post-menopausal   Other Topics Concern  . Not on file   Social History Narrative  . No narrative on file    Review of Systems: All systems reviewed and negative except where noted in HPI.  Physical Exam: Vital signs in last 24 hours: Temp:  [98.1 F (36.7 C)] 98.1 F (36.7 C) (03/24 1321) Pulse Rate:  [96-111] 106 (03/24 1340) Resp:  [15-21] 15 (03/24 1347) BP: (118-173)/(71-113) 118/71 mmHg (03/24 1347) SpO2:  [90 %-100 %] 90 % (03/24 1347)   General:   Alert,  well-developed, cooperative white female  Head:  Normocephalic and atraumatic. Eyes:  Sclera clear, no icterus. Conjunctiva pink. Ears:  Normal auditory acuity. Mouth:  No deformity or lesions. Neck:  Supple; no masses or thyromegaly. Lungs:  Clear throughout to auscultation.   No wheezes, crackles, or rhonchi. No acute distress. Heart:  Regular rate and rhythm. Abdomen:  Soft, nontender and nondistended. No masses, hepatomegaly. No obvious masses.  Normal bowel .    Msk:  Symmetrical without gross deformities. Normal posture. Extremities:  BLE pitting edema. Neurologic:  Alert and  oriented x4;  grossly normal neurologically. Skin:  Bruising over left eye.. Laceration left knee Cervical Nodes:  No significant cervical adenopathy. Psych:  Alert and cooperative. Normal mood and affect.  Lab Results:  Recent Labs  09/28/12 0931  WBC 6.3  HGB 7.1*  HCT 21.6*  PLT 196    Impression / Plan:   1. Recent gastrointestinal hemorrhage, EGD, colonoscopy and small bowel capsule study unrevealing. Patient had negative  enteroscopy today (to mid jejunum). No bleeding in several days but hgb drifting. Patient needs observational stay. She is slightly short of breath and fell  at home. She will be transfused 2 units of blood this evening.Marland Kitchen Refer to admitting orders.   2. Gastritis, severe on recent EGD. She is positive for H.Pylori. Treatment can be addressed after resolution of #1.   3. Anemia, macrocytic. Patient gives history of chronic intermittent GI bleeding but interestingly her MCV has gone from 81 to 100.9 over last several months. Anemia may be mixed, not just related to blood loss.Will transfuse 2 units of blood.   3. Fall at home over weekend. Patient fell down 15 stairs onto marble flood. brusing over left eye, superficial laceration left knee. No other obvious trauma.   4. Hepatomegaly / fatty liver by CTscan. We discussed fatty liver including basic treatment (weight loss, triglyceride control, ETOH, etc..)   5. HTN. BP normal post-procedure. Hold Lotensin until am.     LOS: 0 days   Willette Cluster  09/28/2012, 2:19  PM

## 2012-09-28 NOTE — H&P (View-Only) (Signed)
09/25/2012 [name] [name] [id number] 01/08/1946   History of Present Illness:  Patient is a 66-year-old female who had a screening colonoscopy by Dr. Jacobs in 2009. Findings included diverticulosis and small hemorrhoids. Patient was admitted to the hospital 09/18/12 with melena. She had been taking NSAIDs for chronic back pain. On admission patient was found to have a 3 g drop in her hemoglobin from a baseline of around 12. EGD revealed severe diffuse gastritis and moderate duodenitis in the proximal small bowel. Patient also had a colonoscopy revealing moderate diverticulosis and small internal hemorrhoids. While hospitalized patient have a small bowel video capsule study performed. Following that, patient was discharged home as she her bleeding had stopped. Patient worked in today because her capsule study showed bleeding in the proximal small bowel.   Patient has not had any further bleeding since prior to hospital discharge. Hemoglobin today at 7.9, it was 8.4 three days ago. Patient feels okay. She has no unusual weakness, shortness of breath or chest pain. She has not been taking NSAIDs.  Current Medications, Allergies, Past Medical History, Past Surgical History, Family History and Social History were reviewed in [place] [facility] electronic medical record.   Physical Exam: General: Well developed , white female in no acute distress Head: Normocephalic and atraumatic Eyes:  sclerae anicteric, conjunctiva pale Ears: Normal auditory acuity Lungs: Clear throughout to auscultation Heart: Regular rate and rhythm Abdomen: Soft, non tender and non distended. No masses, no hepatomegaly. Normal bowel sounds Musculoskeletal: Symmetrical with no gross deformities  Extremities: Blateral lower extremity pitting edema Neurological: Alert oriented x 4, grossly nonfocal Psychological:  Alert and cooperative. Normal mood and affect  Assessment and Recommendations:  1. Gastrointestinal hemorrhage.  Givens capsule study with blood in proximal small bowel (I do not have actual report at present). Rule out AVM. No further overt bleeding. Hgb has drifted but patient seems to be tolerating it. Patient needs small bowel enteroscopy for evaluation and treatment. Dr Jacobs has arranged for this to be done on Monday (today is Friday). The benefits, risks, and potential complications of SBE discussed with patient and she agrees to proceed.  Patient will come to our lab Monday morning at 7:30 for repeat CBC. If hgb has declined further, patient may need admission to the hospital for blood transfusion and inpatient enteroscopy. Patient advised to go to the ED if she has any overt bleeding over the weekend. She would benefit from   2. Gastritis, severe on recent EGD. She is positive for H.Pylori. Treatment can be addressed after resolution of #1.   3. Anemia, macrocytic. Patient gives history of chronic intermittent GI bleeding but interestingly her MCV has gone from 81 to 100.9 over last several months. Anemia may be mixed, not just related to blood loss. ETOH contributing?  4. Hepatomegaly / fatty liver by CTscan. We discussed fatty liver including basic treatment (weight loss, triglyceride control, ETOH, etc..)  5. Hypotension. Blood pressure 80/50. Patient asymptomatic. She is on Lotensin which I asked her to hold until after her small bowel enteroscopy. I will forward a copy of my note to Dr. Swords, her PCP, regarding Lotensin.             

## 2012-09-28 NOTE — Interval H&P Note (Signed)
History and Physical Interval Note:  09/28/2012 1:10 PM  [name] [name]  has presented today for surgery, with the diagnosis of .  The various methods of treatment have been discussed with the patient and family. After consideration of risks, benefits and other options for treatment, the patient has consented to  Procedure(s): ESOPHAGOGASTRODUODENOSCOPY (EGD) (N/A) ENTEROSCOPY (N/A) as a surgical intervention .  The patient's history has been reviewed, patient examined, no change in status, stable for surgery.  I have reviewed the patient's chart and labs.  Questions were answered to the patient's satisfaction.     Rob Bunting

## 2012-09-28 NOTE — Progress Notes (Signed)
I agree with the plan outlined in this note 

## 2012-09-28 NOTE — H&P (Signed)
________________________________________________________________________  [facility] MD note:  I personally examined the patient, reviewed the data and agree with the assessment and plan described above.   Sharae Zappulla, MD [facility] Pager [phone]  

## 2012-09-29 ENCOUNTER — Encounter (HOSPITAL_COMMUNITY): Payer: Self-pay | Admitting: Gastroenterology

## 2012-09-29 ENCOUNTER — Other Ambulatory Visit: Payer: Self-pay

## 2012-09-29 DIAGNOSIS — D649 Anemia, unspecified: Secondary | ICD-10-CM

## 2012-09-29 LAB — RETICULOCYTES
RBC.: 2.87 MIL/uL — ABNORMAL LOW (ref 3.87–5.11)
Retic Count, Absolute: 109.1 10*3/uL (ref 19.0–186.0)
Retic Ct Pct: 3.8 % — ABNORMAL HIGH (ref 0.4–3.1)

## 2012-09-29 LAB — CBC
HCT: 25.4 % — ABNORMAL LOW (ref 36.0–46.0)
Hemoglobin: 8.6 g/dL — ABNORMAL LOW (ref 12.0–15.0)
MCHC: 33.9 g/dL (ref 30.0–36.0)
MCV: 95.5 fL (ref 78.0–100.0)

## 2012-09-29 LAB — FERRITIN: Ferritin: 27 ng/mL (ref 10–291)

## 2012-09-29 LAB — IRON AND TIBC: Saturation Ratios: 18 % — ABNORMAL LOW (ref 20–55)

## 2012-09-29 LAB — VITAMIN B12: Vitamin B-12: >2000 pg/mL — ABNORMAL HIGH (ref 211–911)

## 2012-09-29 NOTE — Progress Notes (Signed)
[facility] Progress Note  SUBJECTIVE: feels good. Still no overt bleeding.   OBJECTIVE:  Vital signs in last 24 hours: Temp:  [98 F (36.7 C)-99.2 F (37.3 C)] 98.9 F (37.2 C) (03/25 0548) Pulse Rate:  [88-111] 88 (03/25 0548) Resp:  [13-27] 16 (03/25 0548) BP: (118-173)/(70-113) 129/74 mmHg (03/25 0548) SpO2:  [90 %-100 %] 96 % (03/25 0548) Weight:  [205 lb (92.987 kg)] 205 lb (92.987 kg) (03/24 1700) Last BM Date: 09/29/12 General:    Pleasant white female in NAD Heart:  Regular rate and rhythm; no murmurs Lungs: Respirations even and unlabored, lungs CTA bilaterally Abdomen:  Soft, nontender and nondistended. Normal bowel sounds. Extremities:  BLE pitting edema Neurologic:  Alert and oriented,  grossly normal neurologically. Psych:  Cooperative. Normal mood and affect.     Lab Results:  Recent Labs  09/28/12 0931 09/29/12 0420  WBC 6.3 6.8  HGB 7.1* 8.6*  HCT 21.6* 25.4*  PLT 196 145*     ASSESSMENT / PLAN:  1.Recent gastrointestinal hemorrhage, EGD, colonoscopy and small bowel capsule study unrevealing. Patient had negative enteroscopy yesterday (to mid jejunum). No bleeding in several days but hgb drifted to 7.1  2. Gastritis, severe on recent EGD. She is positive for H.Pylori. Treatment can be addressed at time of follow up 10/27/12.   3. Anemia, was macrocytic, now normocytic. Patient got 2 units of blood yesterday, hgb up slightly less than 2 units to 8.6. She is asymptomatic. Though likely iron deficient it is interesting that her MCV has ranged between normal and high. Will check iron studies / B12 / Folate. If iron deficient will start her on oral iron. Patient will get CBC in 10 days at our office. She sees Dr. Christella Hartigan in 3-4 weeks.    3. Fall at home over weekend. Patient fell down 15 stairs onto marble flood. brusing over left eye, superficial laceration left knee. No other obvious trauma.   5. HTN. BP meds were on hold for a few days (at  home) as patient was hypotension in office Friday. BP meds to be restarted today at discharge. I did ask her to follow up with PCP (Dr. Cato Mulligan) for BP check and for evaluation of peripheral edema.     LOS: 1 day   Willette Cluster  09/29/2012, 10:24 AM

## 2012-09-29 NOTE — Discharge Summary (Signed)
[facility] Discharge Summary  Name: [name] [name] MRN: [id number] DOB: 1945/10/18 67 y.o. PCP:  Judie Petit, MD  Date of Admission: 09/28/2012 12:52 PM Date of Discharge: 09/29/2012 Primary Gastroenterologist: Rob Bunting, MD Discharging Physician: Yancey Flemings, MD  Discharge Diagnosis: 1. Recent gastrointestinal hemorrhage. Resolved.   2. Gastritis.  She is positive for H.Pylori. Treatment can be addressed at time of follow up 10/27/12.  3. Anemia, was macrocytic, now normocytic. Improved following 2 units of blood this admission.   3. Fall at home, brusing over left eye, superficial laceration left knee. No other obvious trauma.   5. HTN, stable.  Consultations:none  Procedures Performed:  Ct Abdomen Pelvis W Contrast  09/18/2012  *RADIOLOGY REPORT*  Clinical Data: 67 year old female with abdominal pelvic pain and rectal bleeding.  CT ABDOMEN AND PELVIS WITH CONTRAST  Technique:  Multidetector CT imaging of the abdomen and pelvis was performed following the standard protocol during bolus administration of intravenous contrast.  Contrast: OMNIPAQUE IOHEXOL 300 MG/ML  SOLN  Comparison: 08/24/2012 and 01/20/2012 CTs  Findings: Cardiomegaly and coronary artery calcifications again identified.  Hepatomegaly and fatty infiltration of the liver again identified. A 2 cm lesion within the left liver (image 19) has not changed since 2013. Gallstones versus sludge noted.  There is no CT evidence of acute cholecystitis. The spleen, adrenal glands, pancreas 10 kidneys are unremarkable.  Postoperative changes within the anterior abdominal/pelvic wall noted without evidence of recurrent hernia. Colonic diverticulosis identified without diverticulitis. A probable uterine fibroid is noted. The bladder is within normal limits. No free fluid, enlarged lymph nodes, biliary dilation or abdominal aortic aneurysm identified.  No acute or suspicious bony abnormalities identified.   Degenerative changes within the lumbar spine identified.  IMPRESSION: No evidence of acute abnormality.  Hepatomegaly and fatty infiltration of the liver.  Probable left hepatic hemangioma.  Postoperative changes within the anterior abdominal/pelvic wall without evidence of recurrent hernia.  Cardiomegaly and coronary artery disease.  Colonic diverticulosis and probable uterine fibroid.   Original Report Authenticated By: Harmon Pier, M.D.     GI Procedures:  Small bowel enteroscopy  History/Physical Exam:  See Admission H&P  Admission HPI:  [name] [name] is a 67 y.o. female Hospitalized a couple of weeks ago with melena. She had been taking NSAIDs for chronic back pain. On admission patient was found to have a 3 g drop in her hemoglobin from a baseline of around 12. EGD revealed severe diffuse gastritis and moderate duodenitis in the proximal small bowel. Patient also had a colonoscopy revealing moderate diverticulosis and small internal hemorrhoids. While hospitalized patient had a small bowel video capsule study performed. Pending results of study patient was discharged home as bleeding had ceased. Patient worked into the office Friday because her capsule study showed bleeding in the proximal small bowel. Hgb in the office Friday was 7.9, it had drifted from 10.2 on 09/20/11. Patient was tolerating the hemoglobin drop and had not had any further bleeding since hospitalization. She was scheduled for small bowel enteroscopy today (Monday) for further evaluation. Patient came by office early this am to make sure hgb was stable prior to procedure. Hgb had dropped further to 7.1 without overt GI bleeding. We went ago with the enteroscopy today. The scope was advanced to mid jejunum. No bleeding was found, just a small duodenal erosion. Post-procedure patient being admitted symptomatic anemia. She is slightly SOB  Hospital Course by problem list:  1. Recent gastrointestinal hemorrhage. Prior to this  admission her  EGD and colonoscopy were unrevealing. She had a small bowel video capsule study done during previous admission. Patient was discharged home after bleeding ceased but capsule study shows bleeding in area of proximal small bowel. Patient was called to the office Friday to discuss results. Though she had not rebled in several days her hgb had drifted to 7.9. Patient was tolerating the anemia so the plan was to schedule small bowel enteroscopy for Monday with CBC early that morning. If hgb had declined further then patient would likely need admission / transfusion with inpatient enteroscopy. On day of this admission patient's hgb had drifted further to 7.1 without any overt bleeding. Over the weekend she fell down a flight of stairs at home. The small bowel enteroscopy was done and negative but patient was admitted post-procedure for blood / observation. Her overnight stay was uneventful. Hgb rose from 7.1 to 8.6 post 2 units of blood. Patient felt much stronger after receiving blood. She was discharged home with plans for a CBC in 7-10 days and a follow up appt with Dr. Christella Hartigan in 2-3 weeks.   2. Gastritis, severe on recent EGD. She is positive for H.Pylori. Treatment can be addressed at time of follow up 10/27/12.   3. Anemia, was macrocytic, now normocytic. Patient got 2 units of blood this admission. Post transfusion hgb rose slightly less than 2 units to 8.6. Though likely iron deficient it is interesting that her MCV has ranged between normal and high. Will check iron studies / B12 / Folate. If iron deficient will start her on oral iron. Patient will get CBC in 10 days at our office. She sees Dr. Christella Hartigan in 3-4 weeks.   3. Fall at home over weekend. Patient fell down 15 stairs onto marble floor. She has brusing over left eye, superficial laceration to left knee. No other obvious trauma.   5. HTN. BP meds were on hold for a few days (at home) as patient was hypotension in office Friday. BP meds  to be restarted today at discharge. I did ask her to follow up with PCP (Dr. Cato Mulligan) for BP check and for evaluation of peripheral edema.    Discharge Vitals:  BP 129/74  Pulse 88  Temp(Src) 98.9 F (37.2 C) (Oral)  Resp 16  Ht 5\' 5"  (1.651 m)  Wt 205 lb (92.987 kg)  BMI 34.11 kg/m2  SpO2 96%  Discharge Labs:  Results for orders placed during the hospital encounter of 09/28/12 (from the past 24 hour(s))  PREPARE RBC (CROSSMATCH)     Status: None   Collection Time    09/28/12  4:34 PM      Result Value Range   Order Confirmation ORDER PROCESSED BY BLOOD BANK    TYPE AND SCREEN     Status: None   Collection Time    09/28/12  4:34 PM      Result Value Range   ABO/RH(D) O NEG     Antibody Screen POS     Sample Expiration 10/01/2012     Antibody Identification ANTI-K     PT AG Type NEGATIVE FOR KELL ANTIGEN     DAT, IgG NEG     Unit Number [id number]     Blood Component Type RED CELLS,LR     Unit division 00     Status of Unit ISSUED     Donor AG Type NEGATIVE FOR KELL ANTIGEN     Transfusion Status OK TO TRANSFUSE     Crossmatch Result COMPATIBLE  Unit Number [id number]     Blood Component Type RED CELLS,LR     Unit division 00     Status of Unit ISSUED     Donor AG Type NEGATIVE FOR KELL ANTIGEN     Transfusion Status OK TO TRANSFUSE     Crossmatch Result COMPATIBLE    ABO/RH     Status: None   Collection Time    09/28/12  4:37 PM      Result Value Range   ABO/RH(D) O NEG    CBC     Status: Abnormal   Collection Time    09/29/12  4:20 AM      Result Value Range   WBC 6.8  4.0 - 10.5 K/uL   RBC 2.66 (*) 3.87 - 5.11 MIL/uL   Hemoglobin 8.6 (*) 12.0 - 15.0 g/dL   HCT 11.9 (*) 14.7 - 82.9 %   MCV 95.5  78.0 - 100.0 fL   MCH 32.3  26.0 - 34.0 pg   MCHC 33.9  30.0 - 36.0 g/dL   RDW 56.2 (*) 13.0 - 86.5 %   Platelets 145 (*) 150 - 400 K/uL    Disposition and follow-up:   Ms.Enas B Munford was discharged from [facility] in stable  condition.    Follow-up Appointments: Discharge Orders   Future Appointments Provider Department Dept Phone   10/27/2012 9:45 AM Rachael Fee, MD [facility] [phone]   Future Orders Complete By Expires     Diet - low sodium heart healthy  As directed     Increase activity slowly  As directed     Comments:      Walk with assistance use walker or cane as needed       Discharge Medications:   Medication List    TAKE these medications       ALPRAZolam 0.25 MG tablet  Commonly known as:  XANAX  Take 0.25 mg by mouth daily as needed. For anxiety     aspirin EC 81 MG tablet  Take 81 mg by mouth daily.     benazepril 20 MG tablet  Commonly known as:  LOTENSIN  Take 20 mg by mouth daily.     calcium carbonate 1250 MG tablet  Commonly known as:  OS-CAL - dosed in mg of elemental calcium  Take 1 tablet by mouth daily.     hydrocortisone 25 MG suppository  Commonly known as:  ANUSOL-HC  Place 1 suppository (25 mg total) rectally 2 (two) times daily as needed for hemorrhoids.     pantoprazole 40 MG tablet  Commonly known as:  PROTONIX  Take 1 tablet (40 mg total) by mouth 2 (two) times daily.     simvastatin 40 MG tablet  Commonly known as:  ZOCOR  Take 40 mg by mouth every evening.        Signed: Willette Cluster 09/29/2012, 10:42 AM

## 2012-09-29 NOTE — Care Management Note (Signed)
    Page 1 of 1   09/29/2012     11:05:54 AM   CARE MANAGEMENT NOTE 09/29/2012  Patient:  [name] [name], [name] [name]   Account Number:  [id number]  Date Initiated:  09/29/2012  Documentation initiated by:  Lorenda Ishihara  Subjective/Objective Assessment:   67 yo female admitted s/p EGD, anemia with drop in hgb from 10.2 to 7.9.     Action/Plan:   Home when stable   Anticipated DC Date:  09/29/2012   Anticipated DC Plan:  HOME/SELF CARE      DC Planning Services  CM consult      Choice offered to / List presented to:             Status of service:  Completed, signed off Medicare Important Message given?   (If response is "NO", the following Medicare IM given date fields will be blank) Date Medicare IM given:   Date Additional Medicare IM given:    Discharge Disposition:  HOME/SELF CARE  Per UR Regulation:  Reviewed for med. necessity/level of care/duration of stay  If discussed at Long Length of Stay Meetings, dates discussed:    Comments:

## 2012-09-29 NOTE — Progress Notes (Signed)
Discharge instructions reviewed with patient and spouse, patient to follow up with MD, Iv removed and cath intact, questions and concerns answered Stanford Breed RN 09-29-2012 11:25pm

## 2012-09-30 LAB — TYPE AND SCREEN
ABO/RH(D): O NEG
Antibody Screen: POSITIVE
PT AG Type: NEGATIVE
Unit division: 0

## 2012-10-01 ENCOUNTER — Telehealth: Payer: Self-pay | Admitting: Gastroenterology

## 2012-10-01 NOTE — Telephone Encounter (Signed)
Message copied by Arna Snipe on Thu Oct 01, 2012  3:45 PM ------      Message from: Donata Duff      Created: Tue Aug 18, 2012 10:19 AM       DO NOT BILL ------

## 2012-10-05 ENCOUNTER — Encounter: Payer: Self-pay | Admitting: Gastroenterology

## 2012-10-05 ENCOUNTER — Encounter (HOSPITAL_COMMUNITY)
Admission: EM | Disposition: A | Discharge status: Home / Self Care | Payer: Self-pay | Source: Home / Self Care | Attending: Gastroenterology

## 2012-10-05 ENCOUNTER — Observation Stay (HOSPITAL_COMMUNITY): Payer: BC Managed Care – PPO

## 2012-10-05 ENCOUNTER — Inpatient Hospital Stay (HOSPITAL_COMMUNITY)
Admission: EM | Admit: 2012-10-05 | Discharge: 2012-10-07 | DRG: 174 | Disposition: A | Discharge status: Home / Self Care | Payer: BC Managed Care – PPO | Attending: Gastroenterology | Admitting: Gastroenterology

## 2012-10-05 ENCOUNTER — Other Ambulatory Visit: Payer: Self-pay | Admitting: Internal Medicine

## 2012-10-05 ENCOUNTER — Encounter (HOSPITAL_COMMUNITY): Payer: Self-pay | Admitting: Emergency Medicine

## 2012-10-05 ENCOUNTER — Telehealth: Payer: Self-pay | Admitting: Gastroenterology

## 2012-10-05 DIAGNOSIS — K922 Gastrointestinal hemorrhage, unspecified: Secondary | ICD-10-CM

## 2012-10-05 DIAGNOSIS — I10 Essential (primary) hypertension: Secondary | ICD-10-CM | POA: Diagnosis present

## 2012-10-05 DIAGNOSIS — D689 Coagulation defect, unspecified: Secondary | ICD-10-CM | POA: Diagnosis present

## 2012-10-05 DIAGNOSIS — K289 Gastrojejunal ulcer, unspecified as acute or chronic, without hemorrhage or perforation: Secondary | ICD-10-CM | POA: Diagnosis present

## 2012-10-05 DIAGNOSIS — K2970 Gastritis, unspecified, without bleeding: Secondary | ICD-10-CM | POA: Diagnosis present

## 2012-10-05 DIAGNOSIS — K2990 Gastroduodenitis, unspecified, without bleeding: Secondary | ICD-10-CM | POA: Diagnosis present

## 2012-10-05 DIAGNOSIS — D62 Acute posthemorrhagic anemia: Secondary | ICD-10-CM | POA: Diagnosis present

## 2012-10-05 DIAGNOSIS — F411 Generalized anxiety disorder: Secondary | ICD-10-CM | POA: Diagnosis present

## 2012-10-05 DIAGNOSIS — R791 Abnormal coagulation profile: Secondary | ICD-10-CM

## 2012-10-05 DIAGNOSIS — Z8673 Personal history of transient ischemic attack (TIA), and cerebral infarction without residual deficits: Secondary | ICD-10-CM

## 2012-10-05 DIAGNOSIS — K700 Alcoholic fatty liver: Secondary | ICD-10-CM | POA: Diagnosis present

## 2012-10-05 DIAGNOSIS — D696 Thrombocytopenia, unspecified: Secondary | ICD-10-CM | POA: Diagnosis present

## 2012-10-05 DIAGNOSIS — Z880 Allergy status to penicillin: Secondary | ICD-10-CM

## 2012-10-05 HISTORY — PX: UPPER GASTROINTESTINAL ENDOSCOPY: SHX188

## 2012-10-05 HISTORY — PX: ENTEROSCOPY: SHX5533

## 2012-10-05 LAB — PROTIME-INR
INR: 1.55 — ABNORMAL HIGH (ref 0.00–1.49)
Prothrombin Time: 18.1 seconds — ABNORMAL HIGH (ref 11.6–15.2)

## 2012-10-05 LAB — COMPREHENSIVE METABOLIC PANEL
ALT: 32 U/L (ref 0–35)
AST: 111 U/L — ABNORMAL HIGH (ref 0–37)
CO2: 22 mEq/L (ref 19–32)
Chloride: 105 mEq/L (ref 96–112)
Creatinine, Ser: 0.72 mg/dL (ref 0.50–1.10)
GFR calc Af Amer: >90 mL/min (ref >90)
GFR calc non Af Amer: 89 mL/min — ABNORMAL LOW (ref >90)
Glucose, Bld: 119 mg/dL — ABNORMAL HIGH (ref 70–99)
Total Bilirubin: 1 mg/dL (ref 0.3–1.2)

## 2012-10-05 LAB — SURGICAL PCR SCREEN
MRSA, PCR: NEGATIVE
Staphylococcus aureus: NEGATIVE

## 2012-10-05 LAB — CBC WITH DIFFERENTIAL/PLATELET
Basophils Relative: 1 % (ref 0–1)
Eosinophils Absolute: 0.1 10*3/uL (ref 0.0–0.7)
MCH: 31.8 pg (ref 26.0–34.0)
MCHC: 32.2 g/dL (ref 30.0–36.0)
Neutrophils Relative %: 69 % (ref 43–77)
Platelets: 131 10*3/uL — ABNORMAL LOW (ref 150–400)
RBC: 2.36 MIL/uL — ABNORMAL LOW (ref 3.87–5.11)

## 2012-10-05 LAB — PREPARE RBC (CROSSMATCH)

## 2012-10-05 SURGERY — ENTEROSCOPY
Anesthesia: Moderate Sedation

## 2012-10-05 MED ORDER — ALPRAZOLAM 0.25 MG PO TABS
0.2500 mg | ORAL_TABLET | Freq: Two times a day (BID) | ORAL | Status: DC
Start: 2012-10-05 — End: 2012-10-07
  Administered 2012-10-05 – 2012-10-07 (×4): 0.25 mg via ORAL
  Filled 2012-10-05 (×4): qty 1

## 2012-10-05 MED ORDER — FENTANYL CITRATE 0.05 MG/ML IJ SOLN
INTRAMUSCULAR | Status: DC | PRN
  Administered 2012-10-05 (×4): 25 ug via INTRAVENOUS

## 2012-10-05 MED ORDER — DILTIAZEM HCL 100 MG IV SOLR
5.0000 mg/h | INTRAVENOUS | Status: DC
  Filled 2012-10-05: qty 100

## 2012-10-05 MED ORDER — SODIUM CHLORIDE 0.9 % IV BOLUS (SEPSIS)
500.0000 mL | Freq: Once | INTRAVENOUS | Status: AC
  Administered 2012-10-05: 500 mL via INTRAVENOUS

## 2012-10-05 MED ORDER — MIDAZOLAM HCL 10 MG/2ML IJ SOLN
INTRAMUSCULAR | Status: DC | PRN
  Administered 2012-10-05 (×5): 2 mg via INTRAVENOUS

## 2012-10-05 MED ORDER — FENTANYL CITRATE 0.05 MG/ML IJ SOLN
INTRAMUSCULAR | Status: AC
  Filled 2012-10-05: qty 4

## 2012-10-05 MED ORDER — ONDANSETRON HCL 4 MG/2ML IJ SOLN: 4.0000 mg | Freq: Four times a day (QID) | INTRAMUSCULAR | Status: DC | PRN

## 2012-10-05 MED ORDER — DILTIAZEM LOAD VIA INFUSION
15.0000 mg | Freq: Once | INTRAVENOUS | Status: DC
  Filled 2012-10-05: qty 15

## 2012-10-05 MED ORDER — PANTOPRAZOLE SODIUM 40 MG PO TBEC
40.0000 mg | DELAYED_RELEASE_TABLET | Freq: Two times a day (BID) | ORAL | Status: DC
  Administered 2012-10-05 – 2012-10-07 (×4): 40 mg via ORAL
  Filled 2012-10-05 (×6): qty 1

## 2012-10-05 MED ORDER — ONDANSETRON HCL 4 MG PO TABS: 4.0000 mg | ORAL_TABLET | Freq: Four times a day (QID) | ORAL | Status: DC | PRN

## 2012-10-05 MED ORDER — ALPRAZOLAM 0.25 MG PO TABS: 0.2500 mg | ORAL_TABLET | Freq: Once | ORAL | Status: DC

## 2012-10-05 MED ORDER — MIDAZOLAM HCL 10 MG/2ML IJ SOLN
INTRAMUSCULAR | Status: AC
  Filled 2012-10-05: qty 4

## 2012-10-05 MED ORDER — SIMVASTATIN 40 MG PO TABS
40.0000 mg | ORAL_TABLET | Freq: Every evening | ORAL | Status: DC
  Administered 2012-10-05 – 2012-10-06 (×2): 40 mg via ORAL
  Filled 2012-10-05 (×3): qty 1

## 2012-10-05 MED ORDER — SODIUM CHLORIDE 0.9 % IV SOLN
INTRAVENOUS | Status: DC
  Administered 2012-10-05 – 2012-10-07 (×3): via INTRAVENOUS

## 2012-10-05 NOTE — ED Notes (Signed)
WUJ:[id number]<[id number]> Expected date:<BR> Expected time:<BR> Means of arrival:<BR> Comments:<BR> Bleeding after colonoscopy, nausea

## 2012-10-05 NOTE — ED Notes (Addendum)
Per EMS-pt c/o of rectal bleeding that started last night in which she describes "bloody diarrhea". Pt recently had colonoscopy last week. Hx of anemia. Also c/o of nausea in route 4mg  Zofran given. CBG 114. BP 129/85 HR 126

## 2012-10-05 NOTE — Telephone Encounter (Signed)
FYI for Dr Jacobs  

## 2012-10-05 NOTE — ED Notes (Signed)
MD at bedside. 

## 2012-10-05 NOTE — Telephone Encounter (Signed)
OK, I will forward this to Dr. Russella Dar who is covering hospitals this week. Should consider repeat enteroscopy given capsule findings of clear bleeding in proximal small bowel 1-2 weeks ago.  My enteroscopy last week was normal (done after the capsule)

## 2012-10-05 NOTE — Consult Note (Signed)
Reason for Consult: GI bleed with foreign object in Small bowel. Referring Physician: Russella Dar PCP:  Donalee Citrin is an 67 y.o. female.  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  She has previously undergone 2 abdominal surgeries the first and 2008; she presented with a strangulated umbilical hernia. She underwent exploratory laparotomy, small bowel resection with primary anastomosis and primary repair her umbilical hernia, Dr. Leonie Man. . She was seen again on 01/20/12 by Dr. Emelia Loron with shock, lactic acidosis, acute renal failure, nausea, vomiting, and altered mentation. This led to a second operation on 01/22/12 by Dr. Dwain Sarna. She had incarcerated incisional hernia with small bowel obstruction. She underwent exploratory laparotomy with lysis of adhesions for 1 hour she tolerated this well and was ultimately discharged home. Dr. Dwain Sarna saw her again in February, 2014.  She complained of bright red blood per rectum at that time, and was referred to GI for colonoscopy. She was then admitted on 09/18/12 with GI bleed both upper and lower. She required 2 units of packed cells at that time EGD showed gastritis. She was drinking  and taking nonsteroidals for chronic back pain at that time. Workup at that time included:  EGD showing severe gastritis and moderate duodenitis in the proximal small bowel. Colonoscopy revealed moderate diverticulosis with small internal hemorrhoids. Video capsule study performed showed some bleeding in the small bowel. Capsule endoscopy was not completed until after she was discharged home. She was seen in the GI office on 09/2112 and her hemoglobin was 7.9 down from 10.2. She was asymptomatic and scheduled for repeat bowel enteroscopy on 09/2412. This showed no site for bleeding but hadsince fallen secondary to weakness. She was seen back for repeat endoscopy on 10/05/2012 again with a further drop in her hemoglobin. Repeat small bowel  enteroscopy today shows a metallic object in the jejunal wall associated with a small ulcer in altered anatomy and marked with a clip. There was also some gastritis. There is a suture line proximal to this. Patient was seen by Dr. Glenna Fellows. He's reviewed all the studies, including today's.    Past Medical History  Diagnosis Date  . Hypertension   . H/O brain surgery   . 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 Hernia repair x 2 with resection of Small bowel in 2008.  SBO with lysis of adhesions and primary ventral hernia repair July, 2013.   Marland Kitchen Anxiety/Panic attacks    Chronic back pain, previously using ETOH/NSAID  For pain.    Fatty liver/thrombocytopenia/coagulopathy;  09/18/12 admission attributed to ETOH    Diverticulosis   . Anemia   . Arthritis     Past Surgical History  Procedure Laterality Date  . Laparotomy Exploratory laparotomy, small bowel resection with primary  anastomosis and primary repair of umbilical hernia.12/09/2006, Leonie Man, M.D.   01/22/2012    Procedure: EXPLORATORY LAPAROTOMY;  Surgeon: Emelia Loron, MD;  Location: [facility] OR;  Service: General;  Laterality: N/A;  lysis of adhesions  .     Marland Kitchen Esophagogastroduodenoscopy N/A 09/19/2012    Procedure: ESOPHAGOGASTRODUODENOSCOPY (EGD);  Surgeon: Charna Gale, MD;  Location: [facility] ENDOSCOPY;  Service: Endoscopy;  Laterality: N/A;  . Colonoscopy N/A 09/20/2012    Procedure: COLONOSCOPY;  Surgeon: Charna Tahiry, MD;  Location: [facility] ENDOSCOPY;  Service: Endoscopy;  Laterality: N/A: Small internal Hemorrhoids, moderate diverticulosis in sigmoid colon.  . Givens capsule study N/A 09/21/2012  Procedure: GIVENS CAPSULE STUDY;  Surgeon: Charna Allexis, MD;  Location: [facility] ENDOSCOPY;  Service: Endoscopy;  Laterality: N/A; definite bleeding source the small bowel proximal to the mid small bowel no lesions visualized/here by blood.   . Enteroscopy N/A 09/28/2012    Procedure:  ENTEROSCOPY;  Surgeon: Rachael Fee, MD;  Location: [facility] ENDOSCOPY;  Service: Endoscopy;  Laterality: N/A; there is mild to moderate gastritis proximally there was a small linear erosions in the duodenal bulb. The small bowel mucosa the distal duodenum were completely normal to the mid jejunum.     Family History  Problem Relation Age of Onset  . Cancer Father     pancreatic  . Cancer Maternal Grandmother     breast    Social History:  reports that she has never smoked. She does not have any smokeless tobacco history on file. She reports that she drinks about 12.0 ounces of alcohol per week. She reports that she does not use illicit drugs.  Allergies:  Allergies  Allergen Reactions  . Cephalexin Rash  . Penicillins Rash    Medications:  Prior to Admission:  Prescriptions prior to admission  Medication Sig Dispense Refill  . ALPRAZolam (XANAX) 0.25 MG tablet Take 0.25 mg by mouth daily as needed. For anxiety      . aspirin EC 81 MG tablet Take 81 mg by mouth daily.      . calcium carbonate (OS-CAL - DOSED IN MG OF ELEMENTAL CALCIUM) 1250 MG tablet Take 1 tablet by mouth daily.      . hydrocortisone (ANUSOL-HC) 25 MG suppository Place 1 suppository (25 mg total) rectally 2 (two) times daily as needed for hemorrhoids.  12 suppository  0  . pantoprazole (PROTONIX) 40 MG tablet Take 1 tablet (40 mg total) by mouth 2 (two) times daily.  60 tablet  0  . simvastatin (ZOCOR) 40 MG tablet Take 40 mg by mouth every evening.      . benazepril (LOTENSIN) 20 MG tablet TAKE 1 TABLET ONCE DAILY.  30 tablet  5  . labetalol (NORMODYNE) 100 MG tablet TAKE 1 TABLET TWICE DAILY.  60 tablet  5   Scheduled: . ALPRAZolam  0.25 mg Oral Once  . ALPRAZolam  0.25 mg Oral BID  . pantoprazole  40 mg Oral BID  . simvastatin  40 mg Oral QPM   Continuous: . sodium chloride 75 mL/hr at 10/05/12 1739   ZOX:[id number] (ZOFRAN) IV, ondansetron Anti-infectives   None      Results for orders placed during  the hospital encounter of 10/05/12 (from the past 48 hour(s))  COMPREHENSIVE METABOLIC PANEL     Status: Abnormal   Collection Time    10/05/12 10:48 AM      Result Value Range   Sodium 137  135 - 145 mEq/L   Potassium 4.6  3.5 - 5.1 mEq/L   Chloride 105  96 - 112 mEq/L   CO2 22  19 - 32 mEq/L   Glucose, Bld 119 (*) 70 - 99 mg/dL   BUN 14  6 - 23 mg/dL   Creatinine, Ser 1.91  0.50 - 1.10 mg/dL   Calcium 7.9 (*) 8.4 - 10.5 mg/dL   Total Protein 6.4  6.0 - 8.3 g/dL   Albumin 1.7 (*) 3.5 - 5.2 g/dL   AST 478 (*) 0 - 37 U/L   ALT 32  0 - 35 U/L   Alkaline Phosphatase 171 (*) 39 - 117 U/L   Total Bilirubin 1.0  0.3 -  1.2 mg/dL   GFR calc non Af Amer 89 (*) >90 mL/min   GFR calc Af Amer >90  >90 mL/min   Comment:            The eGFR has been calculated     using the CKD EPI equation.     This calculation has not been     validated in all clinical     situations.     eGFR's persistently     <90 mL/min signify     possible Chronic Kidney Disease.  CBC WITH DIFFERENTIAL     Status: Abnormal   Collection Time    10/05/12 10:48 AM      Result Value Range   WBC 6.9  4.0 - 10.5 K/uL   RBC 2.36 (*) 3.87 - 5.11 MIL/uL   Hemoglobin 7.5 (*) 12.0 - 15.0 g/dL   HCT 45.4 (*) 09.8 - 11.9 %   MCV 98.7  78.0 - 100.0 fL   MCH 31.8  26.0 - 34.0 pg   MCHC 32.2  30.0 - 36.0 g/dL   RDW 14.7 (*) 82.9 - 56.2 %   Platelets 131 (*) 150 - 400 K/uL   Neutrophils Relative 69  43 - 77 %   Neutro Abs 4.8  1.7 - 7.7 K/uL   Lymphocytes Relative 16  12 - 46 %   Lymphs Abs 1.1  0.7 - 4.0 K/uL   Monocytes Relative 12  3 - 12 %   Monocytes Absolute 0.8  0.1 - 1.0 K/uL   Eosinophils Relative 2  0 - 5 %   Eosinophils Absolute 0.1  0.0 - 0.7 K/uL   Basophils Relative 1  0 - 1 %   Basophils Absolute 0.1  0.0 - 0.1 K/uL  PROTIME-INR     Status: Abnormal   Collection Time    10/05/12 10:48 AM      Result Value Range   Prothrombin Time 18.1 (*) 11.6 - 15.2 seconds   INR 1.55 (*) 0.00 - 1.49  TYPE AND SCREEN      Status: None   Collection Time    10/05/12 10:48 AM      Result Value Range   ABO/RH(D) O NEG     Antibody Screen NEG     Sample Expiration 10/08/2012     Unit Number [id number]     Blood Component Type RBC, LR IRR     Unit division 00     Status of Unit ALLOCATED     Donor AG Type NEGATIVE FOR KELL ANTIGEN     Transfusion Status OK TO TRANSFUSE     Crossmatch Result COMPATIBLE     Unit Number [id number]     Blood Component Type RBC, LR IRR     Unit division 00     Status of Unit ALLOCATED     Donor AG Type NEGATIVE FOR KELL ANTIGEN     Transfusion Status OK TO TRANSFUSE     Crossmatch Result COMPATIBLE      Dg Abd 1 View  10/05/2012  *RADIOLOGY REPORT*  Clinical Data: GI bleed.  Possible foreign body within the jejunum.  ABDOMEN - 1 VIEW  Comparison: CT 09/18/2012  Findings: 2 views of the abdomen and pelvis.  These are presumably supine.  Gas-filled bowel loops could represent mild adynamic ileus or be secondary to recent enteroscopy.  There is a radiopaque object projecting over the right upper quadrant which measures 1.5 cm.  No definite correlate on the 09/18/2012  CT.  A more subtle radiopaque density projects over the left upper pelvis and measures 1.7 cm.  This also is without definite correlate on the prior CT.  IMPRESSION: Right upper quadrant and left lower quadrant radiopaque objects projecting over the bowel.  No definite correlate in these areas on the prior CT.  Foreign object cannot be entirely excluded.  Gas filled small bowel loops could represent mild adynamic ileus or be secondary to recent enteroscopy.   Original Report Authenticated By: Jeronimo Greaves, M.D.     Review of Systems  Constitutional: Negative for fever, chills and weight loss.       Larey Seat last week and went down some steps from fall.  HENT: Negative.   Eyes: Negative.   Respiratory: Positive for shortness of breath (DOE). Negative for cough, hemoptysis, sputum production and wheezing.    Cardiovascular: Positive for palpitations, claudication (some thigh pain with ambulation), leg swelling and PND. Negative for orthopnea (with panic attacks).  Gastrointestinal: Positive for diarrhea and blood in stool. Negative for heartburn, vomiting and abdominal pain.       BRB/dark stool guiac + since admit 09/18/12.  Pt reported blood in stool back in Feb 2014   Genitourinary: Negative.   Musculoskeletal: Positive for back pain (chronic before fall, and worse now with fall.. Pt reported taking 5-6 glasses of wine per day 3/14, also on NSAIDS for pain.), joint pain (knees some) and falls (2 weeks ago fell.).  Skin: Positive for itching and rash.       She has some crusting on abdomen from scratching, and also the cats   Neurological: Positive for weakness.  Endo/Heme/Allergies: Negative.   Psychiatric/Behavioral: The patient is nervous/anxious (hx of panic attacks about 1 per month , had sone today.).    Blood pressure 140/74, pulse 101, temperature 99 F (37.2 C), temperature source Oral, resp. rate 18, height 5\' 5"  (1.651 m), weight 220 lb (99.791 kg), SpO2 100.00%. Physical Exam  Assessment/Plan: 1. Recurrent GI bleed; small bowel enteroscopy shows metallic objects in the jejunal wall with associated small ulcer in altered anatomy, along with gastritis. 2. Status post exploratory laparotomy and small bowel resection with hernia repair 2008 Dr. Lurene Shadow. Status post exploratory laparotomy lysis of adhesions primary ventral hernia repair with suture 01/22/12 Dr. Emelia Loron. 3. Hypertension 4. History of cerebral aneurysm with coiling procedure/CVA 2006 5. Anxiety/panic attacks 6. Moderate alcohol use with fatty liver/thrombocytopenia/coagulopathy during her 09/18/12 admission. 7. Anemia secondary to GI bleed. 8. Chronic back pain worse after falling. 9.Body mass index is 36.6  Plan.  Dr. Johna Sheriff has evaluated the patient. She is hemodynamically stable after enteroscopy. H./H.=  7.5/23.3., GI plans transfused tonight. INR 1.55., LFTs mildly elevated.  We will review the laboratories in the morning, Dr. Johna Sheriff will discuss with Dr. Russella Dar. We will make her n.p.o. for possible surgery tomorrow pending a.m. on evaluation.  Will [facility] physician assistant for Dr. Glenna Fellows.   Tvisha Schwoerer 10/05/2012, 5:31 PM

## 2012-10-05 NOTE — ED Notes (Signed)
Meredith Pel, NP verbally discontinued Xanax as pt began doing deep breathing and HR decreased to 101.  Endo here to transfer pt to endo lab.  They will obtain signed consent in endo.

## 2012-10-05 NOTE — Op Note (Signed)
[facility] [address] [place], [address]   OPERATIVE PROCEDURE REPORT  PATIENT: [name] [name], [name] [name]  MR#: [id number] BIRTHDATE: 08-14-1945 , 66  yrs. old GENDER: Female ENDOSCOPIST: Meryl Dare, MD, [facility] PROCEDURE DATE: 10/05/2012 PROCEDURE:   Diagnostic small bowel enteroscopy ASA CLASS:   Class II INDICATIONS:1.  melenic bleeding.   2.  anemia. MEDICATIONS: medications were titrated to patient response per physician's verbal order, Fentanyl 100 mcg IV, and Versed 10 mg IV  TOPICAL ANESTHETIC:   Cetacaine Spray DESCRIPTION OF PROCEDURE:   After the risks benefits and alternatives of the procedure were thoroughly explained, informed consent was obtained.  The Pentax [id number]-[id number]  endoscope was introduced through the mouth  and advanced to the mid to distal jejunum , without limitations.   The instrument was slowly withdrawn as the mucosa was fully examined.   Metallic appearing staples or sutures  were found in the wall of the mid to distal jejunum with a small ulceration adjacent to this site.  Focally altered SB anatomony with features of scarring, fibrosis or an anastomosis. A clip was placed adjacent to this site to mark the location which caused a small amount of bleeding, no bleeding noted prior to this. The first atempted clip placement fell off and travelled distally in the SB. All other visualized areas in the jejunum appeared normal. Gastritis was found in the fundus, body and the antrum of the stomach. Gastritis was erythematous in the body and fundus and erosive in the preplyoric area. The esophagus appeared normal.  Retroflexed views revealed no abnormalities.    The scope was then withdrawn from the patient and the procedure completed.  COMPLICATIONS: There were no complications.  ENDOSCOPIC IMPRESSION: 1.    Metallic objects in jejunal wall with associated small ulcer and altered anatomy; marked with a clip 2.     Gastritis  RECOMMENDATIONS: 1 .  Surgical consult 2.   KUB today   eSigned:  Meryl Dare, MD, [facility] 10/05/2012 3:49 PM   CC: Rob Bunting, MD

## 2012-10-05 NOTE — H&P (Signed)
[facility] Admission Note  Primary Care Physician:  Judie Petit, MD Primary Gastroenterologist:    Rob Bunting, MD   HPI: [name] [name] is a 67 y.o. female who we have been working up for gastrointestinal bleeding. She was hospitalized a few weeks ago with melena in setting of NSAIDs for chronic back pain. On admission patient was found to have a 3 g drop in her hemoglobin from a baseline of around 12. EGD revealed severe diffuse gastritis and moderate duodenitis in the proximal small bowel. Patient also had a colonoscopy revealing moderate diverticulosis and small internal hemorrhoids. While hospitalized patient had a small bowel video capsule study performed. Pending results of study patient was discharged home as bleeding had ceased. Patient worked into the office Friday 09/25/12 because her capsule study showed bleeding in the proximal small bowel. Hgb in the office 09/25/12 was 7.9, it had drifted from 10.2 on 09/20/11. Patient was asymptomatic and had not had any further bleeding since hospital discharge so she was scheduled for small bowel enteroscopy Monday 09/28/12. Enteroscopy by Dr .Christella Hartigan to mid jejunum was non-diagnostic.  No bleeding was found, just a small duodenal erosion. Her hgb was rechecked and despite no recent bleeding had dropped further to 7.1. She had fallen down flight of stairs over weekend, possibly for weakness related to anemia. Marland Kitchen Post-procedure patient was admitted for blood and observation.  Discharge hgb was 8.6 on 09/29/12.  Patient back in ED today. She began having recurrent painless bleeding yesterday. Stools black with bright red. She was a little "woosy" this am. She took 4-5 Ibuprofen recently for pain related to recent fall. No other NSAIDS, no blood thinners.   Past Medical History  Diagnosis Date  . Hypertension   . H/O brain surgery   . Aneurysm of anterior cerebral artery   . Hernia   . Anxiety   . Stroke   . Anemia   . Arthritis      Past Surgical History  Procedure Laterality Date  . Laparotomy  01/22/2012    Procedure: EXPLORATORY LAPAROTOMY;  Surgeon: Emelia Loron, MD;  Location: [facility] OR;  Service: General;  Laterality: N/A;  lysis of adhesions  . Hernia repair    . Esophagogastroduodenoscopy N/A 09/19/2012    Procedure: ESOPHAGOGASTRODUODENOSCOPY (EGD);  Surgeon: Charna Jakiah, MD;  Location: [facility] ENDOSCOPY;  Service: Endoscopy;  Laterality: N/A;  . Colonoscopy N/A 09/20/2012    Procedure: COLONOSCOPY;  Surgeon: Charna Reah, MD;  Location: [facility] ENDOSCOPY;  Service: Endoscopy;  Laterality: N/A;  . Givens capsule study N/A 09/21/2012    Procedure: GIVENS CAPSULE STUDY;  Surgeon: Charna Seniya, MD;  Location: [facility] ENDOSCOPY;  Service: Endoscopy;  Laterality: N/A;  . Enteroscopy N/A 09/28/2012    Procedure: ENTEROSCOPY;  Surgeon: Rachael Fee, MD;  Location: [facility] ENDOSCOPY;  Service: Endoscopy;  Laterality: N/A;    Prior to Admission medications   Medication Sig Start Date End Date Taking? Authorizing Provider  ALPRAZolam (XANAX) 0.25 MG tablet Take 0.25 mg by mouth daily as needed. For anxiety   Yes Historical Provider, MD  aspirin EC 81 MG tablet Take 81 mg by mouth daily.   Yes Historical Provider, MD  benazepril (LOTENSIN) 20 MG tablet Take 20 mg by mouth daily.   Yes Historical Provider, MD  calcium carbonate (OS-CAL - DOSED IN MG OF ELEMENTAL CALCIUM) 1250 MG tablet Take 1 tablet by mouth daily.   Yes Historical Provider, MD  hydrocortisone (ANUSOL-HC) 25 MG suppository Place 1 suppository (25 mg total) rectally  2 (two) times daily as needed for hemorrhoids. 09/22/12  Yes Shanker Levora Dredge, MD  pantoprazole (PROTONIX) 40 MG tablet Take 1 tablet (40 mg total) by mouth 2 (two) times daily. 09/22/12  Yes Shanker Levora Dredge, MD  simvastatin (ZOCOR) 40 MG tablet Take 40 mg by mouth every evening.   Yes Historical Provider, MD    Current Facility-Administered Medications  Medication Dose Route Frequency Provider Last Rate  Last Dose  . diltiazem (CARDIZEM) 1 mg/mL load via infusion 15 mg  15 mg Intravenous Once Nelia Shi, MD      . diltiazem (CARDIZEM) 100 mg in dextrose 5 % 100 mL infusion  5-15 mg/hr Intravenous Titrated Nelia Shi, MD       Current Outpatient Prescriptions  Medication Sig Dispense Refill  . ALPRAZolam (XANAX) 0.25 MG tablet Take 0.25 mg by mouth daily as needed. For anxiety      . aspirin EC 81 MG tablet Take 81 mg by mouth daily.      . benazepril (LOTENSIN) 20 MG tablet Take 20 mg by mouth daily.      . calcium carbonate (OS-CAL - DOSED IN MG OF ELEMENTAL CALCIUM) 1250 MG tablet Take 1 tablet by mouth daily.      . hydrocortisone (ANUSOL-HC) 25 MG suppository Place 1 suppository (25 mg total) rectally 2 (two) times daily as needed for hemorrhoids.  12 suppository  0  . pantoprazole (PROTONIX) 40 MG tablet Take 1 tablet (40 mg total) by mouth 2 (two) times daily.  60 tablet  0  . simvastatin (ZOCOR) 40 MG tablet Take 40 mg by mouth every evening.        Allergies as of 10/05/2012 - Review Complete 10/05/2012  Allergen Reaction Noted  . Cephalexin Rash   . Penicillins Rash     Family History  Problem Relation Age of Onset  . Cancer Father     pancreatic  . Cancer Maternal Grandmother     breast    Review of Systems: All systems reviewed and negative except where noted in HPI  Physical Exam: Vital signs in last 24 hours: Temp:  [98.8 F (37.1 C)] 98.8 F (37.1 C) (03/31 1011) Pulse Rate:  [67] 67 (03/31 1011) Resp:  [18] 18 (03/31 1011) BP: (144)/(66) 144/66 mmHg (03/31 1011) SpO2:  [98 %] 98 % (03/31 1011)   General:   Pleasant white female in NAD Head:  Normocephalic and atraumatic. Eyes:  Sclera clear, no icterus.   Conjunctiva pink. Ears:  Normal auditory acuity. . Neck:  Supple; no masses . Lungs:  Clear throughout to auscultation.   No wheezes, crackles, or rhonchi. No acute distress. Heart:  Regular rhythm, tachy low 120's. Abdomen:  Soft,  nondistended, nontender. No masses, hepatomegaly. No obvious masses.  Normal bowel .     Msk:  Symmetrical without gross deformities.. Pulses:  Normal pulses noted. Extremities:  BLE pitting edema.  Neurologic:  Alert and  oriented x4;  grossly normal neurologically. Skin:  Intact without significant lesions or rashes. Cervical Nodes:  No significant cervical adenopathy. Psych:  Alert and cooperative. Normal mood and affect.  Lab Results:  Recent Labs  10/05/12 1048  WBC 6.9  HGB 7.5*  HCT 23.3*  PLT 131*   BMET  Recent Labs  10/05/12 1048  NA 137  K 4.6  CL 105  CO2 22  GLUCOSE 119*  BUN 14  CREATININE 0.72  CALCIUM 7.9*   LFT  Recent Labs  10/05/12 1048  PROT 6.4  ALBUMIN 1.7*  AST 111*  ALT 32  ALKPHOS 171*  BILITOT 1.0   PT/INR  Recent Labs  10/05/12 1048  LABPROT 18.1*  INR 1.55*    Impression / Plan:  1. Pleasant 67 year old female with recurrent GI small bowel bleeding. She will need repeat small bowel enteroscopy today. The benefits, risks, and potential complications of enteroscopy were discussed with the patient and she agrees to proceed. She will need observational stay following procedure.   2. Tachycardia. IV Diltiazem ordered by ED. I think patient is tachycardic from combination of bleeding and anxiety. Her BP is soft, concerned Diltiazem with drop BP further and this may complicate sedation for procedure. Spoke with ED physician, Diltiazem cancelled. I would bolus her with fluid. Blood ordered. Low dose Xanax ordered.   3. Hx of HTN, she ran out of anti-hypertensives this week. Hold BP meds for now.   4. Anxiety, continue home Xanax.    LOS: 0 days   Willette Cluster  10/05/2012, 1:13 PM    I have taken a history, examined the patient and reviewed the chart. I agree with the Advanced Practitioner's note, impression and recommendations. Recurrent SB bleeding and anemia secondary to blood loss. Patient care plan was discussed with Dr.  Christella Hartigan. Plan for transfusions and repeat enteroscopy today. Avoid ASA/NSAIDs completely for now. Consider transfer to a center with full SB enteroscopy capabilities and consider mesenteric angiography.  Meryl Dare MD [facility]

## 2012-10-06 ENCOUNTER — Encounter (HOSPITAL_COMMUNITY): Payer: Self-pay | Admitting: Gastroenterology

## 2012-10-06 ENCOUNTER — Ambulatory Visit: Payer: BC Managed Care – PPO | Admitting: Gastroenterology

## 2012-10-06 DIAGNOSIS — R791 Abnormal coagulation profile: Secondary | ICD-10-CM

## 2012-10-06 LAB — APTT: aPTT: 45 seconds — ABNORMAL HIGH (ref 24–37)

## 2012-10-06 LAB — COMPREHENSIVE METABOLIC PANEL
AST: 97 U/L — ABNORMAL HIGH (ref 0–37)
Albumin: 1.8 g/dL — ABNORMAL LOW (ref 3.5–5.2)
Alkaline Phosphatase: 155 U/L — ABNORMAL HIGH (ref 39–117)
BUN: 14 mg/dL (ref 6–23)
Chloride: 104 mEq/L (ref 96–112)
Potassium: 4 mEq/L (ref 3.5–5.1)
Total Bilirubin: 2.9 mg/dL — ABNORMAL HIGH (ref 0.3–1.2)
Total Protein: 6.2 g/dL (ref 6.0–8.3)

## 2012-10-06 LAB — URINALYSIS, ROUTINE W REFLEX MICROSCOPIC
Glucose, UA: NEGATIVE mg/dL
Protein, ur: NEGATIVE mg/dL
Specific Gravity, Urine: 1.021 (ref 1.005–1.030)
Urobilinogen, UA: 0.2 mg/dL (ref 0.0–1.0)

## 2012-10-06 LAB — TYPE AND SCREEN
Donor AG Type: NEGATIVE
Donor AG Type: NEGATIVE

## 2012-10-06 LAB — PROTIME-INR: Prothrombin Time: 18.3 seconds — ABNORMAL HIGH (ref 11.6–15.2)

## 2012-10-06 LAB — CBC
MCH: 31.4 pg (ref 26.0–34.0)
MCHC: 33.1 g/dL (ref 30.0–36.0)
Platelets: 102 10*3/uL — ABNORMAL LOW (ref 150–400)
RDW: 17.6 % — ABNORMAL HIGH (ref 11.5–15.5)

## 2012-10-06 MED ORDER — PHYTONADIONE 5 MG PO TABS
5.0000 mg | ORAL_TABLET | Freq: Once | ORAL | Status: AC
  Administered 2012-10-06: 5 mg via ORAL
  Filled 2012-10-06: qty 1

## 2012-10-06 NOTE — Progress Notes (Signed)
Patient interviewed and examined, agree with PA note above.  She is stable overnight and feels better after transfusion.  I discussed with the patient and her husband my concerns about her liver disease and alcohol and risks of proceeding with emergency surgery. I discussed this with Dr. Russella Dar who agrees that watchful waiting and evaluation of her liver disease would be initially the safest step. Continue to follow closely.  Mariella Saa MD, FACS  10/06/2012 9:03 AM

## 2012-10-06 NOTE — Progress Notes (Signed)
1 Day Post-Op  Subjective: Transfused last PM, no real complaints, says she went to BR a lot last PM but couldn't tell if it was bloody or not.    Objective: Vital signs in last 24 hours: Temp:  [98.1 F (36.7 C)-99 F (37.2 C)] 98.5 F (36.9 C) (04/01 0532) Pulse Rate:  [67-138] 96 (04/01 0532) Resp:  [11-32] 16 (04/01 0532) BP: (93-150)/(48-88) 114/68 mmHg (04/01 0532) SpO2:  [93 %-100 %] 98 % (04/01 0532) Weight:  [220 lb (99.791 kg)] 220 lb (99.791 kg) (03/31 1726) Last BM Date: 10/06/12 Afebrile,BP stable, some tachycardia, H/H is up after transfusion, Bilirubin, Alk Phos, AST, INR, ptt are all up some, Platelets down   Intake/Output from previous day: 03/31 0701 - 04/01 0700 In: 762.5 [I.V.:750; Blood:12.5] Out: 500 [Urine:500] Intake/Output this shift:    General appearance: alert, cooperative and no distress GI: soft, non-tender; bowel sounds normal; no masses,  no organomegaly and crusting on abd wall from scratching at home.  Lab Results:   Recent Labs  10/05/12 1048 10/06/12 0400  WBC 6.9 5.3  HGB 7.5* 8.9*  HCT 23.3* 26.9*  PLT 131* 102*    BMET  Recent Labs  10/05/12 1048 10/06/12 0400  NA 137 137  K 4.6 4.0  CL 105 104  CO2 22 26  GLUCOSE 119* 91  BUN 14 14  CREATININE 0.72 0.83  CALCIUM 7.9* 7.7*   PT/INR  Recent Labs  10/05/12 1048 10/06/12 0400  LABPROT 18.1* 18.3*  INR 1.55* 1.57*     Recent Labs Lab 10/05/12 1048 10/06/12 0400  AST 111* 97*  ALT 32 29  ALKPHOS 171* 155*  BILITOT 1.0 2.9*  PROT 6.4 6.2  ALBUMIN 1.7* 1.8*     Lipase  No results found for this basename: lipase     Studies/Results: Dg Chest 2 View  10/05/2012  *RADIOLOGY REPORT*  Clinical Data: Preop  CHEST - 2 VIEW  Comparison: 01/20/2012  Findings: The heart is upper normal in size.  Increased AP diameter of the chest and bronchitic changes are likely related to COPD. Subsegmental atelectasis at the left base.  No pleural effusion. No pneumothorax.   Left-sided rib deformities have a chronic appearance.  IMPRESSION: No active cardiopulmonary disease.  Chronic changes.   Original Report Authenticated By: Jolaine Click, M.D.    Dg Abd 1 View  10/05/2012  *RADIOLOGY REPORT*  Clinical Data: GI bleed.  Possible foreign body within the jejunum.  ABDOMEN - 1 VIEW  Comparison: CT 09/18/2012  Findings: 2 views of the abdomen and pelvis.  These are presumably supine.  Gas-filled bowel loops could represent mild adynamic ileus or be secondary to recent enteroscopy.  There is a radiopaque object projecting over the right upper quadrant which measures 1.5 cm.  No definite correlate on the 09/18/2012 CT.  A more subtle radiopaque density projects over the left upper pelvis and measures 1.7 cm.  This also is without definite correlate on the prior CT.  IMPRESSION: Right upper quadrant and left lower quadrant radiopaque objects projecting over the bowel.  No definite correlate in these areas on the prior CT.  Foreign object cannot be entirely excluded.  Gas filled small bowel loops could represent mild adynamic ileus or be secondary to recent enteroscopy.   Original Report Authenticated By: Jeronimo Greaves, M.D.     Medications: . ALPRAZolam  0.25 mg Oral Once  . ALPRAZolam  0.25 mg Oral BID  . pantoprazole  40 mg Oral BID  .  simvastatin  40 mg Oral QPM    Assessment/Plan 1. Recurrent GI bleed; small bowel enteroscopy shows metallic objects in the jejunal wall with associated small ulcer in altered anatomy, along with gastritis.  2. Status post exploratory laparotomy and small bowel resection with hernia repair 2008 Dr. Lurene Shadow. Status post exploratory laparotomy lysis of adhesions primary ventral hernia repair with suture 01/22/12 Dr. Emelia Loron.  3. Hypertension  4. History of cerebral aneurysm with coiling procedure/CVA 2006  5. Anxiety/panic attacks  6. Moderate alcohol use with fatty liver/thrombocytopenia/coagulopathy during her 09/18/12 admission.  7.  Anemia secondary to GI bleed.  8. Chronic back pain worse after falling.  9.Body mass index is 36.6  Plan:  Dr. Johna Sheriff will review with DR. Russella Dar and make a decision about surgery later this AM.  Concerned with elevated LFT's, coagulopathy, and low platelet count. EKG has not been done.    LOS: 1 day    [name] [name] 10/06/2012

## 2012-10-06 NOTE — Care Management Note (Signed)
  CARE MANAGEMENT NOTE 10/06/2012  Patient:  NATALEE, TOMKIEWICZ   Account Number:  [id number]  Date Initiated:  10/06/2012  Documentation initiated by:  Catha Ontko  Subjective/Objective Assessment:   67 yo female admitted with anemia.  Primary Care Physician:  Judie Petit, MD  Primary Gastroenterologist:    Rob Bunting, MD     Action/Plan:   Home when stable   Anticipated DC Date:     Anticipated DC Plan:  HOME/SELF CARE  In-house referral  NA      DC Planning Services  CM consult      Choice offered to / List presented to:  NA           Status of service:  In process, will continue to follow Medicare Important Message given?   (If response is "NO", the following Medicare IM given date fields will be blank) Date Medicare IM given:   Date Additional Medicare IM given:    Discharge Disposition:    Per UR Regulation:  Reviewed for med. necessity/level of care/duration of stay  If discussed at Long Length of Stay Meetings, dates discussed:    Comments:  10/06/12 1445 Levonia Wolfley,RN,BSN [phone]

## 2012-10-06 NOTE — Consult Note (Signed)
Please see PA note above. I have interviewed and examined the patient and reviewed all of her imaging and endoscopic reports. She has a significant history of small bowel resection in 2008 for an incarcerated umbilical hernia and subsequent laparotomy and lysis of adhesions for a bowel obstruction and primary hernia repair last fall. She has worsening episodes of GI bleeding over several months. She has had an extensive workup. Previous endoscopy has shown gastritis. She however presents with further bleeding. Capsule endoscopy just over a week ago showed definite fresh blood in the mid small intestine though no definitive source was seen. She had a previous enteroscopy that was negative. However enteroscopy today which likely went further down the small intestine shows a metallic foreign body(? Surgical clip?) In the lumen of the small bowel with a small adjacent ulcer. Active bleeding was not seen.  Appear likely that this is the source of her bleeding which is adjacent to her previous anastomosis. However we do not have definitive proof.  The patient admits to drinking several glasses of wine per day and laboratory evaluation shows an albumin of 1.8, rising bilirubin and moderately elevated INR and moderately low platelet count. I think this may be contributing to her bleeding and certainly would put her at increased risk for surgical complications. She currently is stable and I would prefer that we evaluate her liver disease and I have encouraged her to abstain from any alcohol. If we could improve these parameters I think her bleeding could potentially stop that she would certainly be a better operative risk. Of course she continues active bleeding would be forced to go ahead. Will discuss with Dr. Russella Dar.  Mariella Saa MD, FACS  10/06/2012, 9:02 AM

## 2012-10-06 NOTE — Progress Notes (Signed)
[facility] Progress Note  SUBJECTIVE:feel okay  OBJECTIVE:  Vital signs in last 24 hours: Temp:  [98.1 F (36.7 C)-99 F (37.2 C)] 98.5 F (36.9 C) (04/01 0532) Pulse Rate:  [94-138] 96 (04/01 0532) Resp:  [11-32] 16 (04/01 0532) BP: (93-150)/(48-88) 114/68 mmHg (04/01 0532) SpO2:  [93 %-100 %] 98 % (04/01 0532) Weight:  [220 lb (99.791 kg)] 220 lb (99.791 kg) (03/31 1726) Last BM Date: 10/06/12 General:    Pleasant white female in NAD Heart:  Regular rate and rhythm Lungs: Respirations even and unlabored, lungs CTA bilaterally Abdomen:  Soft, nontender and nondistended. Normal bowel sounds. Extremities:  BLEedema. Neurologic:  Alert and oriented,  grossly normal neurologically. Psych:  Cooperative. Normal mood and affect.  Lab Results:  Recent Labs  10/05/12 1048 10/06/12 0400  WBC 6.9 5.3  HGB 7.5* 8.9*  HCT 23.3* 26.9*  PLT 131* 102*   BMET  Recent Labs  10/05/12 1048 10/06/12 0400  NA 137 137  K 4.6 4.0  CL 105 104  CO2 22 26  GLUCOSE 119* 91  BUN 14 14  CREATININE 0.72 0.83  CALCIUM 7.9* 7.7*   LFT  Recent Labs  10/06/12 0400  PROT 6.2  ALBUMIN 1.8*  AST 97*  ALT 29  ALKPHOS 155*  BILITOT 2.9*   PT/INR  Recent Labs  10/05/12 1048 10/06/12 0400  LABPROT 18.1* 18.3*  INR 1.55* 1.57*    Studies/Results: Dg Chest 2 View  10/05/2012  *RADIOLOGY REPORT*  Clinical Data: Preop  CHEST - 2 VIEW  Comparison: 01/20/2012  Findings: The heart is upper normal in size.  Increased AP diameter of the chest and bronchitic changes are likely related to COPD. Subsegmental atelectasis at the left base.  No pleural effusion. No pneumothorax.  Left-sided rib deformities have a chronic appearance.  IMPRESSION: No active cardiopulmonary disease.  Chronic changes.   Original Report Authenticated By: Jolaine Click, M.D.    Dg Abd 1 View  10/05/2012  *RADIOLOGY REPORT*  Clinical Data: GI bleed.  Possible foreign body within the jejunum.  ABDOMEN - 1  VIEW  Comparison: CT 09/18/2012  Findings: 2 views of the abdomen and pelvis.  These are presumably supine.  Gas-filled bowel loops could represent mild adynamic ileus or be secondary to recent enteroscopy.  There is a radiopaque object projecting over the right upper quadrant which measures 1.5 cm.  No definite correlate on the 09/18/2012 CT.  A more subtle radiopaque density projects over the left upper pelvis and measures 1.7 cm.  This also is without definite correlate on the prior CT.  IMPRESSION: Right upper quadrant and left lower quadrant radiopaque objects projecting over the bowel.  No definite correlate in these areas on the prior CT.  Foreign object cannot be entirely excluded.  Gas filled small bowel loops could represent mild adynamic ileus or be secondary to recent enteroscopy.   Original Report Authenticated By: Jeronimo Greaves, M.D.      ASSESSMENT / PLAN:  1.  Recurrent GI bleed. Bleeding resolved at present. No clear cut site of bleeding found on enteroscopy yesterday but a metal object found in jejunum. Object at site of previous small bowel surgery. Appreciate surgery's help. This imbedded object may or may not be source of recurrent bleeding. Patient will likely need full enteroscopy at [facility] (or other facility with equipment) to exclude any other source of bleeding.    2. Anemia of acute blood loss. Hgb up 8.9 post 2 units of blood.   3,  Abnormal LFTs (mixed pattern), mild coagulopathy, thrombocytopenia, hypoalbuminemia.  Labs from recent  hospitalization reveal normal ceruloplasmin, negative HCV, HBV studies. Her ANA was weakly positive, speckled pattern.  AST to ALT ratio raises question of ETOH but in addition patient has known history of severe steatosis with hepatomegaly on recent imaging studies. Patient obviously has some degree of underlying liver disease, doubt cirrhosis (yet) but can be difficult to sort out in setting of ongoing ETOH use..   Will check ASMA.   Give dose of  Vit K in light of bleeding potential.   Recheck INR, LFTs in am.   We discussed ETOH use. Over last 6 months she has consumed 3 glasses wine /day. No signs of ETOH withdrawal. Patient knows she needs to stop drinking and feels she can without any problem  4. Thrombocytopenia. Platelets went from 252 July 2013 to 89 Feb 2014. Since then platelets have ranged from 66 to 102. Normal spleen on CTscan last month.  Question some bone marrow suppression from ETOH?  5.  Gallstones vrs sludge on imaging studies. No abdominal pain or biliary dilation. Doubt contributing to abnormal LFTs.   LOS: 1 day   Willette Cluster  10/06/2012, 11:47 AM    I have taken an interval history, reviewed the chart and examined the patient. I agree with the Advanced Practitioner's note, impression and recommendations.   Venita Lick. Russella Dar MD Clementeen Graham

## 2012-10-07 ENCOUNTER — Other Ambulatory Visit: Payer: Self-pay | Admitting: Gastroenterology

## 2012-10-07 ENCOUNTER — Telehealth: Payer: Self-pay | Admitting: *Deleted

## 2012-10-07 DIAGNOSIS — K922 Gastrointestinal hemorrhage, unspecified: Secondary | ICD-10-CM

## 2012-10-07 DIAGNOSIS — D649 Anemia, unspecified: Secondary | ICD-10-CM

## 2012-10-07 LAB — CBC
MCH: 32.3 pg (ref 26.0–34.0)
MCHC: 33.9 g/dL (ref 30.0–36.0)
MCV: 95.4 fL (ref 78.0–100.0)
Platelets: 93 10*3/uL — ABNORMAL LOW (ref 150–400)
RDW: 18.2 % — ABNORMAL HIGH (ref 11.5–15.5)

## 2012-10-07 LAB — HEPATIC FUNCTION PANEL
AST: 110 U/L — ABNORMAL HIGH (ref 0–37)
Albumin: 1.7 g/dL — ABNORMAL LOW (ref 3.5–5.2)
Alkaline Phosphatase: 151 U/L — ABNORMAL HIGH (ref 39–117)
Bilirubin, Direct: 1.1 mg/dL — ABNORMAL HIGH (ref 0.0–0.3)
Total Bilirubin: 2.1 mg/dL — ABNORMAL HIGH (ref 0.3–1.2)

## 2012-10-07 LAB — PROTIME-INR: Prothrombin Time: 18.5 seconds — ABNORMAL HIGH (ref 11.6–15.2)

## 2012-10-07 MED ORDER — PHYTONADIONE 5 MG PO TABS
5.0000 mg | ORAL_TABLET | Freq: Once | ORAL | Status: AC
  Administered 2012-10-07: 5 mg via ORAL
  Filled 2012-10-07: qty 1

## 2012-10-07 MED ORDER — PHYTONADIONE 5 MG PO TABS: 5.0000 mg | ORAL_TABLET | Freq: Every day | ORAL | Status: AC

## 2012-10-07 NOTE — Telephone Encounter (Signed)
Called [facility] at [phone] and spoke with TIffany. Need to fax hx, demographics and insurance info over to [phone] and will review. Will schedule in 3-5 days. Willette Cluster, NP aware.

## 2012-10-07 NOTE — Telephone Encounter (Signed)
Per Willette Cluster, NP, patient needs to be referred to [facility] to see Dr. Theodore Demark ASAP for spiral enteroscopy indication recurrent GI bleeding + capsule study. Patient has been hospitalized with bleeding x 3 and is high risk for another bleed. Dr. Christella Hartigan patient.

## 2012-10-07 NOTE — Telephone Encounter (Signed)
[name] [name], patient for cbc in a week. Will you add an INR to that please? Thanks

## 2012-10-07 NOTE — Care Management Note (Signed)
Cm spoke with patient concerning discharge planning. Pt states PTA independent. No HH services or DME needs stated. Pt states MD to inform pt later today if pt will discharge home with arrangements later in the week to follow up at [facility] for enteroscopy vs tx to [facility] from [facility]. Will follow up if tx arrangements required by CM.   Roxy Manns Laurali Goddard,RN,BSN [phone]

## 2012-10-07 NOTE — Discharge Summary (Signed)
[facility] Discharge Summary  Name: [name] [name] MRN: [id number] DOB: 23-May-1946 67 y.o. PCP:  Judie Petit, MD  Date of Admission: 10/05/2012 10:09 AM Date of Discharge: 10/07/2012 Primary Gastroenterologist: Rob Bunting, MD Discharging Physician:  Claudette Head, MD  Discharge Diagnosis: 1. Recurrent small bowel bleed, resolved. Exact location of bleed still not apparent.    2. Anemia of acute blood loss, improved after 2 units of blood  3. HTN, stable.   4. Abnormal LFT, likely combination of ETOH and fatty liver disease. Labs improving.   5. Coagulopathy, likely secondary to ETOH.   6. Thrombocytopenia, questionably secondary to bone marrow suppression (from ETOH?).   Consultations: [facility]  Procedures Performed:  Dg Chest 2 View  10/05/2012  *RADIOLOGY REPORT*  Clinical Data: Preop  CHEST - 2 VIEW  Comparison: 01/20/2012  Findings: The heart is upper normal in size.  Increased AP diameter of the chest and bronchitic changes are likely related to COPD. Subsegmental atelectasis at the left base.  No pleural effusion. No pneumothorax.  Left-sided rib deformities have a chronic appearance.  IMPRESSION: No active cardiopulmonary disease.  Chronic changes.   Original Report Authenticated By: Jolaine Click, M.D.    Dg Abd 1 View  10/05/2012  *RADIOLOGY REPORT*  Clinical Data: GI bleed.  Possible foreign body within the jejunum.  ABDOMEN - 1 VIEW  Comparison: CT 09/18/2012  Findings: 2 views of the abdomen and pelvis.  These are presumably supine.  Gas-filled bowel loops could represent mild adynamic ileus or be secondary to recent enteroscopy.  There is a radiopaque object projecting over the right upper quadrant which measures 1.5 cm.  No definite correlate on the 09/18/2012 CT.  A more subtle radiopaque density projects over the left upper pelvis and measures 1.7 cm.  This also is without definite correlate on the prior CT.  IMPRESSION: Right  upper quadrant and left lower quadrant radiopaque objects projecting over the bowel.  No definite correlate in these areas on the prior CT.  Foreign object cannot be entirely excluded.  Gas filled small bowel loops could represent mild adynamic ileus or be secondary to recent enteroscopy.   Original Report Authenticated By: Jeronimo Greaves, M.D.    Ct Abdomen Pelvis W Contrast  09/18/2012  *RADIOLOGY REPORT*  Clinical Data: 67 year old female with abdominal pelvic pain and rectal bleeding.  CT ABDOMEN AND PELVIS WITH CONTRAST  Technique:  Multidetector CT imaging of the abdomen and pelvis was performed following the standard protocol during bolus administration of intravenous contrast.  Contrast: OMNIPAQUE IOHEXOL 300 MG/ML  SOLN  Comparison: 08/24/2012 and 01/20/2012 CTs  Findings: Cardiomegaly and coronary artery calcifications again identified.  Hepatomegaly and fatty infiltration of the liver again identified. A 2 cm lesion within the left liver (image 19) has not changed since 2013. Gallstones versus sludge noted.  There is no CT evidence of acute cholecystitis. The spleen, adrenal glands, pancreas 10 kidneys are unremarkable.  Postoperative changes within the anterior abdominal/pelvic wall noted without evidence of recurrent hernia. Colonic diverticulosis identified without diverticulitis. A probable uterine fibroid is noted. The bladder is within normal limits. No free fluid, enlarged lymph nodes, biliary dilation or abdominal aortic aneurysm identified.  No acute or suspicious bony abnormalities identified.  Degenerative changes within the lumbar spine identified.  IMPRESSION: No evidence of acute abnormality.  Hepatomegaly and fatty infiltration of the liver.  Probable left hepatic hemangioma.  Postoperative changes within the anterior abdominal/pelvic wall without evidence of recurrent hernia.  Cardiomegaly and  coronary artery disease.  Colonic diverticulosis and probable uterine fibroid.   Original  Report Authenticated By: Harmon Pier, M.D.     GI Procedures: Small bowel enteroscopy (Dr. Russella Dar)  History/Physical Exam:  See Admission H&P  Admission HPI: [name] [name] is a 67 y.o. female who we have been working up for gastrointestinal bleeding. She was hospitalized a few weeks ago with melena in setting of NSAIDs for chronic back pain. On admission patient was found to have a 3 g drop in her hemoglobin from a baseline of around 12. EGD revealed severe diffuse gastritis and moderate duodenitis in the proximal small bowel. Patient also had a colonoscopy revealing moderate diverticulosis and small internal hemorrhoids. While hospitalized patient had a small bowel video capsule study performed. Pending results of study patient was discharged home as bleeding had ceased. Patient worked into the office Friday 09/25/12 because her capsule study showed bleeding in the proximal small bowel. Hgb in the office 09/25/12 was 7.9, it had drifted from 10.2 on 09/20/11. Patient was asymptomatic and had not had any further bleeding since hospital discharge so she was scheduled for small bowel enteroscopy Monday 09/28/12. Enteroscopy by Dr .Christella Hartigan to mid jejunum was non-diagnostic. No bleeding was found, just a small duodenal erosion. Her hgb was rechecked and despite no recent bleeding had dropped further to 7.1. She had fallen down flight of stairs over weekend, possibly for weakness related to anemia. Post-procedure patient was admitted for blood and observation. Discharge hgb was 8.6 on 09/29/12.   Patient back in ED today. She began having recurrent painless bleeding yesterday. Stools black with bright red blood. She was a little "woosy" this am. She took 4-5 Ibuprofen recently for pain related to recent fall. No other NSAIDS, no blood thinners.   Hospital Course by problem list:  1. Gastrointestinal bleeding, resolved. Patient was admitted from ED to our service 10/05/12. Her presenting hgb was 7.5, down one  gram from 09/29/12. Her blood pressure was borderline low, HR in 120's. She responded to IVFs. On day of admission patient underwent repeat small bowel enteroscopy at which time a metallic staple like object was found in the mid to distal jejunal wall near what appeared to be a small bowel anastomosis. There was an ulcer adjacent to this metal object but no active bleeding.  Post -procedure patient was admitted for blood transfusion and observation. Surgery was asked to evaluate patient regarding the foreign object found in the jejunum during enteroscopy. She was seen by Dr.Hoxworth who felt the foreign body might be the site of intermittent bleeding but there were concerns about underlying chronic liver disease and surgical risks. Plan was to refer patient to [facility] (Dr. Theodore Demark) for complete enteroscopy prior to pursuing surgery. By 10/07/12 patient was stable for discharge. She would come to our office in 7-10 days for repeat CBC followed by hospital follow up with Dr. Christella Hartigan, 10/27/12. In the interim our office would contact her regarding referral to [facility].  2 Anemia of acute blood loss, improved. Hgb rose to 8.9 following transfusion of 2 units of blood. Outpatient CBC in 7-10 days  3. HTN. Stable. Patient ran out of blood pressure medications at home prior to discharge. Her Normodyne and Lotensin, as prescribed by PCP, were resumed at discharge.  4. Elevated LFTs, mixed pattern. Patient has known history of fatty liver disease. In addition, over the last several months patient had been consuming approximately 3 glasses of wine daily. Several conversations held with patient regarding need to  abstain from alcohol. She had a weakly positive ANA (speckled pattern). ASMA pending at time of discharge. Viral hepatitis studies from previous hospitalization were negative. LFTs improving by time of discharge.  5. Coagulopathy. INR  1.59 at time of discharge .She got one dose of oral vitamin K in the hospital . In  addition, patient given 3 day supply of vitamin K to take at home. Will recheck her coags when CBC checked next week.   6. Thrombocytopenia.  Platelets 131 on admission, down to 93 on day of discharge. Etiology not clear, possibly bone marrow suppression from alcohol.    Discharge Vitals:  BP 144/76  Pulse 101  Temp(Src) 98.9 F (37.2 C) (Oral)  Resp 16  Ht 5\' 5"  (1.651 m)  Wt 220 lb (99.791 kg)  BMI 36.61 kg/m2  SpO2 98%  Discharge Labs:  Results for orders placed during the hospital encounter of 10/05/12 (from the past 24 hour(s))  HEPATIC FUNCTION PANEL     Status: Abnormal   Collection Time    10/07/12  3:45 AM      Result Value Range   Total Protein 6.1  6.0 - 8.3 g/dL   Albumin 1.7 (*) 3.5 - 5.2 g/dL   AST 161 (*) 0 - 37 U/L   ALT 28  0 - 35 U/L   Alkaline Phosphatase 151 (*) 39 - 117 U/L   Total Bilirubin 2.1 (*) 0.3 - 1.2 mg/dL   Bilirubin, Direct 1.1 (*) 0.0 - 0.3 mg/dL   Indirect Bilirubin 1.0 (*) 0.3 - 0.9 mg/dL  CBC     Status: Abnormal   Collection Time    10/07/12  3:45 AM      Result Value Range   WBC 4.8  4.0 - 10.5 K/uL   RBC 2.60 (*) 3.87 - 5.11 MIL/uL   Hemoglobin 8.4 (*) 12.0 - 15.0 g/dL   HCT 09.6 (*) 04.5 - 40.9 %   MCV 95.4  78.0 - 100.0 fL   MCH 32.3  26.0 - 34.0 pg   MCHC 33.9  30.0 - 36.0 g/dL   RDW 81.1 (*) 91.4 - 78.2 %   Platelets 93 (*) 150 - 400 K/uL  PROTIME-INR     Status: Abnormal   Collection Time    10/07/12  3:45 AM      Result Value Range   Prothrombin Time 18.5 (*) 11.6 - 15.2 seconds   INR 1.59 (*) 0.00 - 1.49    Disposition and follow-up:   Ms.Markeshia B Grimmer was discharged from [facility] in stable condition.    Follow-up Appointments: Discharge Orders   Future Appointments Provider Department Dept Phone   10/27/2012 9:45 AM Rachael Fee, MD [facility] [phone]   Future Orders Complete By Expires     Discharge instructions  As directed     Comments:      Go to [facility]  Gastro[facility]ology basement for labs in one week. Our office will contact you regarding referral to [facility] for enteroscopy    Resume previous diet  As directed        Discharge Medications:   Medication List    TAKE these medications       ALPRAZolam 0.25 MG tablet  Commonly known as:  XANAX  Take 0.25 mg by mouth daily as needed. For anxiety     aspirin EC 81 MG tablet  Take 81 mg by mouth daily.     benazepril 20 MG tablet  Commonly known as:  LOTENSIN  TAKE 1 TABLET ONCE DAILY.     calcium carbonate 1250 MG tablet  Commonly known as:  OS-CAL - dosed in mg of elemental calcium  Take 1 tablet by mouth daily.     hydrocortisone 25 MG suppository  Commonly known as:  ANUSOL-HC  Place 1 suppository (25 mg total) rectally 2 (two) times daily as needed for hemorrhoids.     labetalol 100 MG tablet  Commonly known as:  NORMODYNE  TAKE 1 TABLET TWICE DAILY.     pantoprazole 40 MG tablet  Commonly known as:  PROTONIX  Take 1 tablet (40 mg total) by mouth 2 (two) times daily.     simvastatin 40 MG tablet  Commonly known as:  ZOCOR  Take 40 mg by mouth every evening.        Signed: Willette Cluster 10/07/2012, 1:14 PM

## 2012-10-07 NOTE — Progress Notes (Signed)
Patient discharged home, all discharge medications and instructions reviewed and questions answered. Patient to be assisted to vehicle by wheelchair.  

## 2012-10-07 NOTE — Progress Notes (Signed)
Patient ID: [name] [name], female   DOB: July 16, 1945, 67 y.o.   MRN: [id number] 2 Days Post-Op  Subjective: No C/O.  Noted a couple small streaks of BRB in BMs (not really consistent with SB source{.  Objective: Vital signs in last 24 hours: Temp:  [98.2 F (36.8 C)-98.9 F (37.2 C)] 98.9 F (37.2 C) (04/02 0624) Pulse Rate:  [96-104] 101 (04/02 0624) Resp:  [16-18] 16 (04/02 0624) BP: (144-158)/(76-80) 144/76 mmHg (04/02 0624) SpO2:  [98 %] 98 % (04/02 0624) Last BM Date: 10/07/12  Intake/Output from previous day: 04/01 0701 - 04/02 0700 In: 1247.6 [P.O.:120; I.V.:1127.6] Out: 600 [Urine:600] Intake/Output this shift: Total I/O In: -  Out: 150 [Urine:150]  General appearance: alert, cooperative and no distress GI: normal findings: soft, non-tender  Lab Results:   Recent Labs  10/06/12 0400 10/07/12 0345  WBC 5.3 4.8  HGB 8.9* 8.4*  HCT 26.9* 24.8*  PLT 102* 93*   BMET  Recent Labs  10/05/12 1048 10/06/12 0400  NA 137 137  K 4.6 4.0  CL 105 104  CO2 22 26  GLUCOSE 119* 91  BUN 14 14  CREATININE 0.72 0.83  CALCIUM 7.9* 7.7*     Studies/Results: Dg Chest 2 View  10/05/2012  *RADIOLOGY REPORT*  Clinical Data: Preop  CHEST - 2 VIEW  Comparison: 01/20/2012  Findings: The heart is upper normal in size.  Increased AP diameter of the chest and bronchitic changes are likely related to COPD. Subsegmental atelectasis at the left base.  No pleural effusion. No pneumothorax.  Left-sided rib deformities have a chronic appearance.  IMPRESSION: No active cardiopulmonary disease.  Chronic changes.   Original Report Authenticated By: Jolaine Click, M.D.    Dg Abd 1 View  10/05/2012  *RADIOLOGY REPORT*  Clinical Data: GI bleed.  Possible foreign body within the jejunum.  ABDOMEN - 1 VIEW  Comparison: CT 09/18/2012  Findings: 2 views of the abdomen and pelvis.  These are presumably supine.  Gas-filled bowel loops could represent mild adynamic ileus or be secondary to recent  enteroscopy.  There is a radiopaque object projecting over the right upper quadrant which measures 1.5 cm.  No definite correlate on the 09/18/2012 CT.  A more subtle radiopaque density projects over the left upper pelvis and measures 1.7 cm.  This also is without definite correlate on the prior CT.  IMPRESSION: Right upper quadrant and left lower quadrant radiopaque objects projecting over the bowel.  No definite correlate in these areas on the prior CT.  Foreign object cannot be entirely excluded.  Gas filled small bowel loops could represent mild adynamic ileus or be secondary to recent enteroscopy.   Original Report Authenticated By: Jeronimo Greaves, M.D.     Anti-infectives: Anti-infectives   None      Assessment/Plan: s/p Procedure(s): ENTEROSCOPY GI bleed, ? SB source Liver Dz, EtOH and/or steatohepatitis No apparent active bleeding currently Ideally plan EtOH abstention and full enteroscopy prior to any surgery unless active ongoing bleeding   LOS: 2 days    [name] [name] 10/07/2012

## 2012-10-07 NOTE — Progress Notes (Signed)
Patient had 3 BM's over the course of shift. Some small streaks of frank red blood present. Per patient and NT, this was similar to the present night. Will continue to monitor.

## 2012-10-07 NOTE — Progress Notes (Signed)
[facility] Progress Note  SUBJECTIVE: feels good today. She had a smear of blood in stool, no other bleeding.   OBJECTIVE:  Vital signs in last 24 hours: Temp:  [98.2 F (36.8 C)-98.9 F (37.2 C)] 98.9 F (37.2 C) (04/02 0624) Pulse Rate:  [96-104] 101 (04/02 0624) Resp:  [16-18] 16 (04/02 0624) BP: (144-158)/(76-80) 144/76 mmHg (04/02 0624) SpO2:  [98 %] 98 % (04/02 0624) Last BM Date: 10/07/12 General:    Pleasant white female in NAD Heart:  Regular rate and rhythm Lungs: Respirations even and unlabored, lungs CTA bilaterally Abdomen:  Soft, nontender and nondistended. Normal bowel sounds. Neurologic:  Alert and oriented,  grossly normal neurologically. Psych:  Cooperative.In great spirits as always..   Lab Results:  Recent Labs  10/05/12 1048 10/06/12 0400 10/07/12 0345  WBC 6.9 5.3 4.8  HGB 7.5* 8.9* 8.4*  HCT 23.3* 26.9* 24.8*  PLT 131* 102* 93*   BMET  Recent Labs  10/05/12 1048 10/06/12 0400  NA 137 137  K 4.6 4.0  CL 105 104  CO2 22 26  GLUCOSE 119* 91  BUN 14 14  CREATININE 0.72 0.83  CALCIUM 7.9* 7.7*   LFT  Recent Labs  10/07/12 0345  PROT 6.1  ALBUMIN 1.7*  AST 110*  ALT 28  ALKPHOS 151*  BILITOT 2.1*  BILIDIR 1.1*  IBILI 1.0*   PT/INR  Recent Labs  10/06/12 0400 10/07/12 0345  LABPROT 18.3* 18.5*  INR 1.57* 1.59*    Studies/Results: Dg Chest 2 View  10/05/2012  *RADIOLOGY REPORT*  Clinical Data: Preop  CHEST - 2 VIEW  Comparison: 01/20/2012  Findings: The heart is upper normal in size.  Increased AP diameter of the chest and bronchitic changes are likely related to COPD. Subsegmental atelectasis at the left base.  No pleural effusion. No pneumothorax.  Left-sided rib deformities have a chronic appearance.  IMPRESSION: No active cardiopulmonary disease.  Chronic changes.   Original Report Authenticated By: Jolaine Click, M.D.    Dg Abd 1 View  10/05/2012  *RADIOLOGY REPORT*  Clinical Data: GI bleed.  Possible  foreign body within the jejunum.  ABDOMEN - 1 VIEW  Comparison: CT 09/18/2012  Findings: 2 views of the abdomen and pelvis.  These are presumably supine.  Gas-filled bowel loops could represent mild adynamic ileus or be secondary to recent enteroscopy.  There is a radiopaque object projecting over the right upper quadrant which measures 1.5 cm.  No definite correlate on the 09/18/2012 CT.  A more subtle radiopaque density projects over the left upper pelvis and measures 1.7 cm.  This also is without definite correlate on the prior CT.  IMPRESSION: Right upper quadrant and left lower quadrant radiopaque objects projecting over the bowel.  No definite correlate in these areas on the prior CT.  Foreign object cannot be entirely excluded.  Gas filled small bowel loops could represent mild adynamic ileus or be secondary to recent enteroscopy.   Original Report Authenticated By: Jeronimo Greaves, M.D.      ASSESSMENT / PLAN:  1. Recurrent GI bleed, resolving.No clear cut site of bleeding found on enteroscopy. She will need a full enteroscopy at [facility] (or other facility with equipment)   2. Anemia of acute blood loss. Hgb stable post transfusion.    3, Abnormal LFTs (mixed pattern), steatohepatitis, mild coagulopathy, thrombocytopenia, hypoalbuminemia. Labs from recent hospitalization reveal normal ceruloplasmin, negative HCV, HBV studies. Her ANA was weakly positive, speckled pattern. ASMA pending. Patient obviously has some degree of underlying  liver disease, doubt cirrhosis (yet) but can be difficult to sort out in setting of ongoing ETOH use. LFTs better today.  Await ASMA.  Give another dose of Vit K, INR didn't improve after first dose.  We discussed ETOH use. Over last 6 months she has consumed 3 glasses wine /day. No signs of ETOH withdrawal. Patient knows she needs to stop drinking and feels she can without any problem  4. Thrombocytopenia. Platelets went from 252 July 2013 to 89 Feb 2014. Since then  platelets have ranged from 66 to 102. Normal spleen on CTscan last month. Question some bone marrow suppression from ETOH?   5. Gallstones vrs sludge on imaging studies. No abdominal pain or biliary dilation. Doubt contributing to abnormal LFTs.    LOS: 2 days   Willette Cluster  10/07/2012, 8:46 AM    I have taken an interval history, reviewed the chart and examined the patient. I agree with the Advanced Practitioner's note, impression and recommendations. Consider discharge home later today if she remains stable with outpatient full enteroscopy and monitoring a CBC, INR and LFTs.   Venita Lick. Russella Dar MD Clement[facility] Graham

## 2012-10-08 NOTE — Discharge Summary (Signed)
Malcolm T. Stark MD FACG  

## 2012-10-09 ENCOUNTER — Telehealth: Payer: Self-pay | Admitting: Nurse Practitioner

## 2012-10-09 NOTE — Telephone Encounter (Signed)
Unable to reach Mr. Fleek on cell. Called home number and spoke with Mrs.Coil. Told her per Dr. Rhea Belton the date may be May 7th for referral.

## 2012-10-09 NOTE — ED Provider Notes (Signed)
History     CSN: [id number]  Arrival date & time 10/05/12  1009   First MD Initiated Contact with Patient 10/05/12 1031      Chief Complaint  Patient presents with  . Abdominal Pain  . Rectal Bleeding     HPI Per EMS-pt c/o of rectal bleeding that started last night in which she describes "bloody diarrhea". Pt recently had colonoscopy last week. Hx of anemia.  Past Medical History  Diagnosis Date  . Hypertension   . H/O brain surgery   . Aneurysm of anterior cerebral artery   . Hernia   . Anxiety   . Stroke   . Anemia   . Arthritis     Past Surgical History  Procedure Laterality Date  . Laparotomy  01/22/2012    Procedure: EXPLORATORY LAPAROTOMY;  Surgeon: Emelia Loron, MD;  Location: [facility] OR;  Service: General;  Laterality: N/A;  lysis of adhesions  . Hernia repair    . Esophagogastroduodenoscopy N/A 09/19/2012    Procedure: ESOPHAGOGASTRODUODENOSCOPY (EGD);  Surgeon: Charna Jamaira, MD;  Location: [facility] ENDOSCOPY;  Service: Endoscopy;  Laterality: N/A;  . Colonoscopy N/A 09/20/2012    Procedure: COLONOSCOPY;  Surgeon: Charna Phylisha, MD;  Location: [facility] ENDOSCOPY;  Service: Endoscopy;  Laterality: N/A;  . Givens capsule study N/A 09/21/2012    Procedure: GIVENS CAPSULE STUDY;  Surgeon: Charna Peytan, MD;  Location: [facility] ENDOSCOPY;  Service: Endoscopy;  Laterality: N/A;  . Enteroscopy N/A 09/28/2012    Procedure: ENTEROSCOPY;  Surgeon: Rachael Fee, MD;  Location: [facility] ENDOSCOPY;  Service: Endoscopy;  Laterality: N/A;  . Upper gastrointestinal endoscopy  10/05/12  . Enteroscopy N/A 10/05/2012    Procedure: ENTEROSCOPY;  Surgeon: Meryl Dare, MD;  Location: [facility] ENDOSCOPY;  Service: Endoscopy;  Laterality: N/A;    Family History  Problem Relation Age of Onset  . Cancer Father     pancreatic  . Cancer Maternal Grandmother     breast    History  Substance Use Topics  . Smoking status: Never Smoker   . Smokeless tobacco: Never Used  . Alcohol Use: 12.0 oz/week    20 Glasses  of wine per week     Comment: daily 2-3  glasses wine day     OB History   Grav Para Term Preterm Abortions TAB SAB Ect Mult Living                  Review of Systems  Constitutional: Negative for fever.  Neurological: Positive for light-headedness.  All other systems reviewed and are negative.    Allergies  Cephalexin and Penicillins  Home Medications   Current Outpatient Rx  Name  Route  Sig  Dispense  Refill  . ALPRAZolam (XANAX) 0.25 MG tablet   Oral   Take 0.25 mg by mouth daily as needed. For anxiety         . aspirin EC 81 MG tablet   Oral   Take 81 mg by mouth daily.         . calcium carbonate (OS-CAL - DOSED IN MG OF ELEMENTAL CALCIUM) 1250 MG tablet   Oral   Take 1 tablet by mouth daily.         . hydrocortisone (ANUSOL-HC) 25 MG suppository   Rectal   Place 1 suppository (25 mg total) rectally 2 (two) times daily as needed for hemorrhoids.   12 suppository   0   . pantoprazole (PROTONIX) 40 MG tablet   Oral   Take 1  tablet (40 mg total) by mouth 2 (two) times daily.   60 tablet   0   . simvastatin (ZOCOR) 40 MG tablet   Oral   Take 40 mg by mouth every evening.         . benazepril (LOTENSIN) 20 MG tablet      TAKE 1 TABLET ONCE DAILY.   30 tablet   5   . labetalol (NORMODYNE) 100 MG tablet      TAKE 1 TABLET TWICE DAILY.   60 tablet   5   . phytonadione (VITAMIN K) 5 MG tablet   Oral   Take 1 tablet (5 mg total) by mouth daily.   3 tablet   0     BP 130/60  Pulse 94  Temp(Src) 98.8 F (37.1 C) (Oral)  Resp 18  Ht 5\' 5"  (1.651 m)  Wt 220 lb (99.791 kg)  BMI 36.61 kg/m2  SpO2 98%  Physical Exam  Nursing note and vitals reviewed. Constitutional: She is oriented to person, place, and time. She appears well-developed and well-nourished. No distress.  HENT:  Head: Normocephalic and atraumatic.  Eyes: Pupils are equal, round, and reactive to light.  Neck: Normal range of motion.  Cardiovascular: Normal rate and  intact distal pulses.   Pulmonary/Chest: No respiratory distress. She has no wheezes.  Abdominal: Normal appearance. She exhibits no distension. There is no tenderness. There is no rebound.  Musculoskeletal: Normal range of motion.  Neurological: She is alert and oriented to person, place, and time. No cranial nerve deficit.  Skin: Skin is warm and dry. No rash noted.  Psychiatric: She has a normal mood and affect. Her behavior is normal.    ED Course  Procedures (including critical care time) GI contacted.  Came to ED and admitted the patient. Labs Reviewed  COMPREHENSIVE METABOLIC PANEL - Abnormal; Notable for the following:    Glucose, Bld 119 (*)    Calcium 7.9 (*)    Albumin 1.7 (*)    AST 111 (*)    Alkaline Phosphatase 171 (*)    GFR calc non Af Amer 89 (*)    All other components within normal limits  CBC WITH DIFFERENTIAL - Abnormal; Notable for the following:    RBC 2.36 (*)    Hemoglobin 7.5 (*)    HCT 23.3 (*)    RDW 17.7 (*)    Platelets 131 (*)    All other components within normal limits  PROTIME-INR - Abnormal; Notable for the following:    Prothrombin Time 18.1 (*)    INR 1.55 (*)    All other components within normal limits  CBC - Abnormal; Notable for the following:    RBC 2.83 (*)    Hemoglobin 8.9 (*)    HCT 26.9 (*)    RDW 17.6 (*)    Platelets 102 (*)    All other components within normal limits  COMPREHENSIVE METABOLIC PANEL - Abnormal; Notable for the following:    Calcium 7.7 (*)    Albumin 1.8 (*)    AST 97 (*)    Alkaline Phosphatase 155 (*)    Total Bilirubin 2.9 (*)    GFR calc non Af Amer 73 (*)    GFR calc Af Amer 85 (*)    All other components within normal limits  APTT - Abnormal; Notable for the following:    aPTT 45 (*)    All other components within normal limits  PROTIME-INR - Abnormal; Notable for the following:  Prothrombin Time 18.3 (*)    INR 1.57 (*)    All other components within normal limits  URINALYSIS, ROUTINE W  REFLEX MICROSCOPIC - Abnormal; Notable for the following:    Color, Urine AMBER (*)    Hgb urine dipstick TRACE (*)    Leukocytes, UA SMALL (*)    All other components within normal limits  ANTI-SMOOTH MUSCLE ANTIBODY, IGG - Abnormal; Notable for the following:    F-Actin IgG 22 (*)    All other components within normal limits  HEPATIC FUNCTION PANEL - Abnormal; Notable for the following:    Albumin 1.7 (*)    AST 110 (*)    Alkaline Phosphatase 151 (*)    Total Bilirubin 2.1 (*)    Bilirubin, Direct 1.1 (*)    Indirect Bilirubin 1.0 (*)    All other components within normal limits  CBC - Abnormal; Notable for the following:    RBC 2.60 (*)    Hemoglobin 8.4 (*)    HCT 24.8 (*)    RDW 18.2 (*)    Platelets 93 (*)    All other components within normal limits  PROTIME-INR - Abnormal; Notable for the following:    Prothrombin Time 18.5 (*)    INR 1.59 (*)    All other components within normal limits  SURGICAL PCR SCREEN  URINE MICROSCOPIC-ADD ON  TYPE AND SCREEN  PREPARE RBC (CROSSMATCH)   No results found.   1. Gastrointestinal hemorrhage   2. Anemia due to blood loss, acute   3. Elevated INR       MDM         Nelia Shi, MD 10/09/12 1136

## 2012-10-09 NOTE — Telephone Encounter (Signed)
Patient's husband called and gave me his correct phone number([phone]) Mr. Mccurley states he is not available for a [facility] appointment on April 23-29 unless is it an emergency. He understands that [facility] may call his home number with appointment before they call me and that May 7th is a possible date being discussed.

## 2012-10-12 NOTE — Progress Notes (Signed)
[name] [name], Patient is h.pylori positive. She has had a lot going on so treatment been on back burner. Please call and let her know that I haven't forgotten about treating her. I would still like to hold off in case antibiotics give her diarrhea / c-diff or anything else because I don't want her appointment at [facility] to get delayed for any reason. It is probably a good idea that she mentions this to Dr. Theodore Demark at [facility] who will do her enteroscopy since he will also find that she has gastritis. Thanks

## 2012-10-13 ENCOUNTER — Telehealth: Payer: Self-pay | Admitting: *Deleted

## 2012-10-13 NOTE — Telephone Encounter (Signed)
Called and spoke with patient's husband. Per Willette Cluster, NP, we are aware of the + h.pylori and will treat it after she see's Dr. Theodore Demark. Please, mention this when patient see's Dr. Theodore Demark. Mr. Headlee understands. Reminded him she is due for labs this week also.

## 2012-10-26 ENCOUNTER — Encounter: Payer: Self-pay | Admitting: *Deleted

## 2012-10-27 ENCOUNTER — Ambulatory Visit: Payer: BC Managed Care – PPO | Admitting: Gastroenterology

## 2012-11-03 ENCOUNTER — Encounter (HOSPITAL_COMMUNITY): Payer: Self-pay | Admitting: Emergency Medicine

## 2012-11-03 ENCOUNTER — Emergency Department (HOSPITAL_COMMUNITY)
Admission: EM | Admit: 2012-11-03 | Discharge: 2012-11-04 | Disposition: A | Discharge status: Home / Self Care | Payer: BC Managed Care – PPO | Attending: Emergency Medicine | Admitting: Emergency Medicine

## 2012-11-03 DIAGNOSIS — F411 Generalized anxiety disorder: Secondary | ICD-10-CM | POA: Insufficient documentation

## 2012-11-03 DIAGNOSIS — D649 Anemia, unspecified: Secondary | ICD-10-CM | POA: Insufficient documentation

## 2012-11-03 DIAGNOSIS — Z8679 Personal history of other diseases of the circulatory system: Secondary | ICD-10-CM | POA: Insufficient documentation

## 2012-11-03 DIAGNOSIS — I10 Essential (primary) hypertension: Secondary | ICD-10-CM | POA: Insufficient documentation

## 2012-11-03 DIAGNOSIS — M129 Arthropathy, unspecified: Secondary | ICD-10-CM | POA: Insufficient documentation

## 2012-11-03 DIAGNOSIS — R112 Nausea with vomiting, unspecified: Secondary | ICD-10-CM | POA: Insufficient documentation

## 2012-11-03 DIAGNOSIS — Z880 Allergy status to penicillin: Secondary | ICD-10-CM | POA: Insufficient documentation

## 2012-11-03 DIAGNOSIS — Z8719 Personal history of other diseases of the digestive system: Secondary | ICD-10-CM | POA: Insufficient documentation

## 2012-11-03 DIAGNOSIS — Z79899 Other long term (current) drug therapy: Secondary | ICD-10-CM | POA: Insufficient documentation

## 2012-11-03 DIAGNOSIS — Z8673 Personal history of transient ischemic attack (TIA), and cerebral infarction without residual deficits: Secondary | ICD-10-CM | POA: Insufficient documentation

## 2012-11-03 DIAGNOSIS — IMO0002 Reserved for concepts with insufficient information to code with codable children: Secondary | ICD-10-CM | POA: Insufficient documentation

## 2012-11-03 DIAGNOSIS — R1031 Right lower quadrant pain: Secondary | ICD-10-CM | POA: Insufficient documentation

## 2012-11-03 DIAGNOSIS — Z9889 Other specified postprocedural states: Secondary | ICD-10-CM | POA: Insufficient documentation

## 2012-11-03 LAB — COMPREHENSIVE METABOLIC PANEL
ALT: 42 U/L — ABNORMAL HIGH (ref 0–35)
Alkaline Phosphatase: 246 U/L — ABNORMAL HIGH (ref 39–117)
BUN: 7 mg/dL (ref 6–23)
CO2: 25 mEq/L (ref 19–32)
GFR calc Af Amer: 72 mL/min — ABNORMAL LOW (ref >90)
GFR calc non Af Amer: 62 mL/min — ABNORMAL LOW (ref >90)
Glucose, Bld: 131 mg/dL — ABNORMAL HIGH (ref 70–99)
Potassium: 4.5 mEq/L (ref 3.5–5.1)
Sodium: 138 mEq/L (ref 135–145)
Total Bilirubin: 1.5 mg/dL — ABNORMAL HIGH (ref 0.3–1.2)
Total Protein: 7.8 g/dL (ref 6.0–8.3)

## 2012-11-03 LAB — LIPASE, BLOOD: Lipase: 30 U/L (ref 11–59)

## 2012-11-03 NOTE — ED Notes (Signed)
Pt has extensive GI hx and has hx of GI bleeding that has restarted tonight. Mass in lower mid abdomen that may or may not be normal according to pt. Hx of hernias. N/V. 4MG  IV zofran. 20 LAC.

## 2012-11-03 NOTE — ED Notes (Signed)
WUJ:[id number]<JY> Expected date:11/03/12<BR> Expected time:10:13 PM<BR> Means of arrival:Ambulance<BR> Comments:<BR> GI problems

## 2012-11-04 LAB — URINALYSIS, ROUTINE W REFLEX MICROSCOPIC
Bilirubin Urine: NEGATIVE
Hgb urine dipstick: NEGATIVE
Ketones, ur: NEGATIVE mg/dL
Nitrite: NEGATIVE
Protein, ur: NEGATIVE mg/dL
Specific Gravity, Urine: 1.024 (ref 1.005–1.030)
Urobilinogen, UA: 1 mg/dL (ref 0.0–1.0)

## 2012-11-04 LAB — CBC WITH DIFFERENTIAL/PLATELET
Eosinophils Absolute: 0.1 10*3/uL (ref 0.0–0.7)
Hemoglobin: 9.8 g/dL — ABNORMAL LOW (ref 12.0–15.0)
Lymphocytes Relative: 14 % (ref 12–46)
Lymphs Abs: 0.8 10*3/uL (ref 0.7–4.0)
MCH: 30.2 pg (ref 26.0–34.0)
MCV: 96.3 fL (ref 78.0–100.0)
Monocytes Relative: 11 % (ref 3–12)
Neutrophils Relative %: 72 % (ref 43–77)
RBC: 3.25 MIL/uL — ABNORMAL LOW (ref 3.87–5.11)
WBC: 5.4 10*3/uL (ref 4.0–10.5)

## 2012-11-04 LAB — URINE MICROSCOPIC-ADD ON

## 2012-11-04 MED ORDER — METOCLOPRAMIDE HCL 5 MG/ML IJ SOLN
10.0000 mg | Freq: Once | INTRAMUSCULAR | Status: AC
  Administered 2012-11-04: 10 mg via INTRAVENOUS
  Filled 2012-11-04: qty 2

## 2012-11-04 MED ORDER — SODIUM CHLORIDE 0.9 % IV BOLUS (SEPSIS)
1000.0000 mL | Freq: Once | INTRAVENOUS | Status: AC
  Administered 2012-11-04: 1000 mL via INTRAVENOUS

## 2012-11-04 MED ORDER — PANTOPRAZOLE SODIUM 40 MG IV SOLR
40.0000 mg | Freq: Once | INTRAVENOUS | Status: AC
  Administered 2012-11-04: 40 mg via INTRAVENOUS
  Filled 2012-11-04: qty 40

## 2012-11-04 MED ORDER — ONDANSETRON HCL 4 MG PO TABS: 4.0000 mg | ORAL_TABLET | Freq: Three times a day (TID) | ORAL | Status: DC | PRN

## 2012-11-04 NOTE — ED Notes (Signed)
Patient is alert and oriented x3.  She was given DC instructions and follow up visit instructions.  Patient gave verbal understanding. She was DC ambulatory under her own power to home.  V/S stable.  He was not showing any signs of distress on DC 

## 2012-11-04 NOTE — ED Provider Notes (Signed)
History     CSN: [id number]  Arrival date & time 11/03/12  2215   First MD Initiated Contact with Patient 11/04/12 [phone]      Chief Complaint  Patient presents with  . Nausea and Vomiting     (Consider location/radiation/quality/duration/timing/severity/associated sxs/prior treatment) HPI This is a 67 year old female with several months history of rectal bleeding. She has had multiple admissions and diagnostic studies without a definitive diagnosis. She is scheduled to go to [facility] in a week for an endoscopy of the small intestine.  She is here with nausea and vomiting that began yesterday evening. This is associated with right lower quadrant abdominal pain. The symptoms were moderate to severe earlier but have improved after being given Zofran IV by EMS. Her pain is now tolerable but she is requesting additional antiemetic medication. The pain is somewhat worse with movement or palpation.   She has also had a return of her rectal bleeding. This has been mild.  Past Medical History  Diagnosis Date  . Hypertension   . H/O brain surgery   . Aneurysm of anterior cerebral artery   . Hernia   . Anxiety   . Stroke   . Anemia   . Arthritis     Past Surgical History  Procedure Laterality Date  . Laparotomy  01/22/2012    Procedure: EXPLORATORY LAPAROTOMY;  Surgeon: Emelia Loron, MD;  Location: [facility] OR;  Service: General;  Laterality: N/A;  lysis of adhesions  . Hernia repair    . Esophagogastroduodenoscopy N/A 09/19/2012    Procedure: ESOPHAGOGASTRODUODENOSCOPY (EGD);  Surgeon: Charna Dorothyann, MD;  Location: [facility] ENDOSCOPY;  Service: Endoscopy;  Laterality: N/A;  . Colonoscopy N/A 09/20/2012    Procedure: COLONOSCOPY;  Surgeon: Charna Katyra, MD;  Location: [facility] ENDOSCOPY;  Service: Endoscopy;  Laterality: N/A;  . Givens capsule study N/A 09/21/2012    Procedure: GIVENS CAPSULE STUDY;  Surgeon: Charna Appolonia, MD;  Location: [facility] ENDOSCOPY;  Service: Endoscopy;  Laterality: N/A;  . Enteroscopy N/A  09/28/2012    Procedure: ENTEROSCOPY;  Surgeon: Rachael Fee, MD;  Location: [facility] ENDOSCOPY;  Service: Endoscopy;  Laterality: N/A;  . Upper gastrointestinal endoscopy  10/05/12  . Enteroscopy N/A 10/05/2012    Procedure: ENTEROSCOPY;  Surgeon: Meryl Dare, MD;  Location: [facility] ENDOSCOPY;  Service: Endoscopy;  Laterality: N/A;    Family History  Problem Relation Age of Onset  . Cancer Father     pancreatic  . Cancer Maternal Grandmother     breast    History  Substance Use Topics  . Smoking status: Never Smoker   . Smokeless tobacco: Never Used  . Alcohol Use: 12.0 oz/week    20 Glasses of wine per week     Comment: daily 2-3  glasses wine day     OB History   Grav Para Term Preterm Abortions TAB SAB Ect Mult Living                  Review of Systems  All other systems reviewed and are negative.    Allergies  Cephalexin and Penicillins  Home Medications   Current Outpatient Rx  Name  Route  Sig  Dispense  Refill  . ALPRAZolam (XANAX) 0.25 MG tablet   Oral   Take 0.25 mg by mouth daily as needed. For anxiety         . benazepril (LOTENSIN) 20 MG tablet      TAKE 1 TABLET ONCE DAILY.   30 tablet   5   .  calcium carbonate (OS-CAL - DOSED IN MG OF ELEMENTAL CALCIUM) 1250 MG tablet   Oral   Take 1 tablet by mouth daily.         . IRON PO   Oral   Take 1 tablet by mouth daily.         Marland Kitchen labetalol (NORMODYNE) 100 MG tablet      TAKE 1 TABLET TWICE DAILY.   60 tablet   5   . Multiple Vitamin (MULTIVITAMIN WITH MINERALS) TABS   Oral   Take 1 tablet by mouth daily.         . pantoprazole (PROTONIX) 40 MG tablet   Oral   Take 1 tablet (40 mg total) by mouth 2 (two) times daily.   60 tablet   0   . simvastatin (ZOCOR) 40 MG tablet   Oral   Take 40 mg by mouth every evening.         . hydrocortisone (ANUSOL-HC) 25 MG suppository   Rectal   Place 1 suppository (25 mg total) rectally 2 (two) times daily as needed for hemorrhoids.   12  suppository   0     BP 123/65  Pulse 79  Temp(Src) 98 F (36.7 C) (Oral)  Resp 16  SpO2 94%  Physical Exam General: Well-developed, well-nourished female in no acute distress; appearance consistent with age of record HENT: normocephalic, atraumatic Eyes: pupils equal round and reactive to light; extraocular muscles intact Neck: supple Heart: regular rate and rhythm Lungs: clear to auscultation bilaterally Abdomen: soft; obese; right suprapubic tenderness; old well-healed surgical incisions; bowel sounds present Extremities: No deformity; full range of motion Neurologic: Awake, alert and oriented; motor function intact in all extremities and symmetric; no facial droop Skin: Warm and dry Psychiatric: Normal mood and affect    ED Course  Procedures (including critical care time)     MDM   Nursing notes and vitals signs, including pulse oximetry, reviewed.  Summary of this visit's results, reviewed by myself:  Labs:  Results for orders placed during the hospital encounter of 11/03/12 (from the past 24 hour(s))  CBC WITH DIFFERENTIAL     Status: Abnormal   Collection Time    11/03/12 10:36 PM      Result Value Range   WBC 5.4  4.0 - 10.5 K/uL   RBC 3.25 (*) 3.87 - 5.11 MIL/uL   Hemoglobin 9.8 (*) 12.0 - 15.0 g/dL   HCT 45.4 (*) 09.8 - 11.9 %   MCV 96.3  78.0 - 100.0 fL   MCH 30.2  26.0 - 34.0 pg   MCHC 31.3  30.0 - 36.0 g/dL   RDW 14.7 (*) 82.9 - 56.2 %   Platelets 64 (*) 150 - 400 K/uL   Neutrophils Relative 72  43 - 77 %   Neutro Abs 3.9  1.7 - 7.7 K/uL   Lymphocytes Relative 14  12 - 46 %   Lymphs Abs 0.8  0.7 - 4.0 K/uL   Monocytes Relative 11  3 - 12 %   Monocytes Absolute 0.6  0.1 - 1.0 K/uL   Eosinophils Relative 2  0 - 5 %   Eosinophils Absolute 0.1  0.0 - 0.7 K/uL   Basophils Relative 1  0 - 1 %   Basophils Absolute 0.0  0.0 - 0.1 K/uL  COMPREHENSIVE METABOLIC PANEL     Status: Abnormal   Collection Time    11/03/12 10:36 PM      Result Value  Range  Sodium 138  135 - 145 mEq/L   Potassium 4.5  3.5 - 5.1 mEq/L   Chloride 104  96 - 112 mEq/L   CO2 25  19 - 32 mEq/L   Glucose, Bld 131 (*) 70 - 99 mg/dL   BUN 7  6 - 23 mg/dL   Creatinine, Ser 1.61  0.50 - 1.10 mg/dL   Calcium 8.0 (*) 8.4 - 10.5 mg/dL   Total Protein 7.8  6.0 - 8.3 g/dL   Albumin 1.8 (*) 3.5 - 5.2 g/dL   AST 096 (*) 0 - 37 U/L   ALT 42 (*) 0 - 35 U/L   Alkaline Phosphatase 246 (*) 39 - 117 U/L   Total Bilirubin 1.5 (*) 0.3 - 1.2 mg/dL   GFR calc non Af Amer 62 (*) >90 mL/min   GFR calc Af Amer 72 (*) >90 mL/min  LIPASE, BLOOD     Status: None   Collection Time    11/03/12 10:36 PM      Result Value Range   Lipase 30  11 - 59 U/L  URINALYSIS, ROUTINE W REFLEX MICROSCOPIC     Status: Abnormal   Collection Time    11/04/12  2:06 AM      Result Value Range   Color, Urine AMBER (*) YELLOW   APPearance CLOUDY (*) CLEAR   Specific Gravity, Urine 1.024  1.005 - 1.030   pH 5.5  5.0 - 8.0   Glucose, UA NEGATIVE  NEGATIVE mg/dL   Hgb urine dipstick NEGATIVE  NEGATIVE   Bilirubin Urine NEGATIVE  NEGATIVE   Ketones, ur NEGATIVE  NEGATIVE mg/dL   Protein, ur NEGATIVE  NEGATIVE mg/dL   Urobilinogen, UA 1.0  0.0 - 1.0 mg/dL   Nitrite NEGATIVE  NEGATIVE   Leukocytes, UA TRACE (*) NEGATIVE  URINE MICROSCOPIC-ADD ON     Status: Abnormal   Collection Time    11/04/12  2:06 AM      Result Value Range   Squamous Epithelial / LPF FEW (*) RARE   WBC, UA 0-2  <3 WBC/hpf   Casts HYALINE CASTS (*) NEGATIVE   4:23 AM The patient's nausea and pain had been relieved. She has had bowel movements without blood or melena. Her hemoglobin is the highest it has been in a significant amount of time and there is no indication for transfusion at this time. She has an appointment at [facility] in 1 week. She was advised that should the rectal bleeding worsen she should return. She requests a prescription for Zofran, which give her the most relief.       Hanley Seamen, MD 11/04/12  [phone]

## 2012-11-11 ENCOUNTER — Telehealth: Payer: Self-pay | Admitting: Gastroenterology

## 2012-11-11 ENCOUNTER — Encounter (HOSPITAL_COMMUNITY): Payer: Self-pay | Admitting: Nurse Practitioner

## 2012-11-11 ENCOUNTER — Inpatient Hospital Stay (HOSPITAL_COMMUNITY)
Admission: EM | Admit: 2012-11-11 | Discharge: 2012-12-03 | DRG: 585 | Disposition: A | Discharge status: Skilled Nursing Facility | Payer: BC Managed Care – PPO | Attending: Internal Medicine | Admitting: Internal Medicine

## 2012-11-11 ENCOUNTER — Observation Stay (HOSPITAL_COMMUNITY): Payer: BC Managed Care – PPO

## 2012-11-11 DIAGNOSIS — D649 Anemia, unspecified: Secondary | ICD-10-CM | POA: Diagnosis present

## 2012-11-11 DIAGNOSIS — N179 Acute kidney failure, unspecified: Secondary | ICD-10-CM

## 2012-11-11 DIAGNOSIS — K660 Peritoneal adhesions (postprocedural) (postinfection): Secondary | ICD-10-CM | POA: Diagnosis present

## 2012-11-11 DIAGNOSIS — Z8673 Personal history of transient ischemic attack (TIA), and cerebral infarction without residual deficits: Secondary | ICD-10-CM

## 2012-11-11 DIAGNOSIS — R933 Abnormal findings on diagnostic imaging of other parts of digestive tract: Secondary | ICD-10-CM

## 2012-11-11 DIAGNOSIS — R17 Unspecified jaundice: Secondary | ICD-10-CM

## 2012-11-11 DIAGNOSIS — R6521 Severe sepsis with septic shock: Secondary | ICD-10-CM

## 2012-11-11 DIAGNOSIS — Z8679 Personal history of other diseases of the circulatory system: Secondary | ICD-10-CM

## 2012-11-11 DIAGNOSIS — S31109A Unspecified open wound of abdominal wall, unspecified quadrant without penetration into peritoneal cavity, initial encounter: Secondary | ICD-10-CM | POA: Diagnosis present

## 2012-11-11 DIAGNOSIS — K658 Other peritonitis: Secondary | ICD-10-CM | POA: Diagnosis present

## 2012-11-11 DIAGNOSIS — K7030 Alcoholic cirrhosis of liver without ascites: Secondary | ICD-10-CM | POA: Diagnosis present

## 2012-11-11 DIAGNOSIS — R7402 Elevation of levels of lactic acid dehydrogenase (LDH): Secondary | ICD-10-CM

## 2012-11-11 DIAGNOSIS — F10239 Alcohol dependence with withdrawal, unspecified: Secondary | ICD-10-CM | POA: Diagnosis present

## 2012-11-11 DIAGNOSIS — F411 Generalized anxiety disorder: Secondary | ICD-10-CM | POA: Diagnosis present

## 2012-11-11 DIAGNOSIS — E669 Obesity, unspecified: Secondary | ICD-10-CM

## 2012-11-11 DIAGNOSIS — I9589 Other hypotension: Secondary | ICD-10-CM | POA: Diagnosis not present

## 2012-11-11 DIAGNOSIS — Y838 Other surgical procedures as the cause of abnormal reaction of the patient, or of later complication, without mention of misadventure at the time of the procedure: Secondary | ICD-10-CM | POA: Diagnosis present

## 2012-11-11 DIAGNOSIS — J95821 Acute postprocedural respiratory failure: Secondary | ICD-10-CM

## 2012-11-11 DIAGNOSIS — R05 Cough: Secondary | ICD-10-CM | POA: Diagnosis present

## 2012-11-11 DIAGNOSIS — R410 Disorientation, unspecified: Secondary | ICD-10-CM

## 2012-11-11 DIAGNOSIS — K560 Paralytic ileus: Secondary | ICD-10-CM | POA: Diagnosis present

## 2012-11-11 DIAGNOSIS — E039 Hypothyroidism, unspecified: Secondary | ICD-10-CM | POA: Diagnosis present

## 2012-11-11 DIAGNOSIS — Z6838 Body mass index (BMI) 38.0-38.9, adult: Secondary | ICD-10-CM

## 2012-11-11 DIAGNOSIS — K7010 Alcoholic hepatitis without ascites: Secondary | ICD-10-CM

## 2012-11-11 DIAGNOSIS — K922 Gastrointestinal hemorrhage, unspecified: Secondary | ICD-10-CM

## 2012-11-11 DIAGNOSIS — D638 Anemia in other chronic diseases classified elsewhere: Secondary | ICD-10-CM | POA: Diagnosis present

## 2012-11-11 DIAGNOSIS — IMO0002 Reserved for concepts with insufficient information to code with codable children: Secondary | ICD-10-CM | POA: Diagnosis not present

## 2012-11-11 DIAGNOSIS — K766 Portal hypertension: Secondary | ICD-10-CM

## 2012-11-11 DIAGNOSIS — R1084 Generalized abdominal pain: Secondary | ICD-10-CM

## 2012-11-11 DIAGNOSIS — E876 Hypokalemia: Secondary | ICD-10-CM | POA: Diagnosis not present

## 2012-11-11 DIAGNOSIS — E785 Hyperlipidemia, unspecified: Secondary | ICD-10-CM

## 2012-11-11 DIAGNOSIS — F1010 Alcohol abuse, uncomplicated: Secondary | ICD-10-CM

## 2012-11-11 DIAGNOSIS — R0600 Dyspnea, unspecified: Secondary | ICD-10-CM

## 2012-11-11 DIAGNOSIS — E861 Hypovolemia: Secondary | ICD-10-CM | POA: Diagnosis present

## 2012-11-11 DIAGNOSIS — R112 Nausea with vomiting, unspecified: Secondary | ICD-10-CM

## 2012-11-11 DIAGNOSIS — K5730 Diverticulosis of large intestine without perforation or abscess without bleeding: Secondary | ICD-10-CM

## 2012-11-11 DIAGNOSIS — F1020 Alcohol dependence, uncomplicated: Secondary | ICD-10-CM | POA: Diagnosis present

## 2012-11-11 DIAGNOSIS — R1310 Dysphagia, unspecified: Secondary | ICD-10-CM | POA: Diagnosis present

## 2012-11-11 DIAGNOSIS — D689 Coagulation defect, unspecified: Secondary | ICD-10-CM

## 2012-11-11 DIAGNOSIS — J9600 Acute respiratory failure, unspecified whether with hypoxia or hypercapnia: Secondary | ICD-10-CM

## 2012-11-11 DIAGNOSIS — K700 Alcoholic fatty liver: Secondary | ICD-10-CM

## 2012-11-11 DIAGNOSIS — E43 Unspecified severe protein-calorie malnutrition: Secondary | ICD-10-CM | POA: Diagnosis present

## 2012-11-11 DIAGNOSIS — D696 Thrombocytopenia, unspecified: Secondary | ICD-10-CM

## 2012-11-11 DIAGNOSIS — F10939 Alcohol use, unspecified with withdrawal, unspecified: Secondary | ICD-10-CM

## 2012-11-11 DIAGNOSIS — N390 Urinary tract infection, site not specified: Secondary | ICD-10-CM | POA: Diagnosis present

## 2012-11-11 DIAGNOSIS — K631 Perforation of intestine (nontraumatic): Principal | ICD-10-CM

## 2012-11-11 DIAGNOSIS — E860 Dehydration: Secondary | ICD-10-CM

## 2012-11-11 DIAGNOSIS — K567 Ileus, unspecified: Secondary | ICD-10-CM

## 2012-11-11 DIAGNOSIS — J383 Other diseases of vocal cords: Secondary | ICD-10-CM | POA: Diagnosis present

## 2012-11-11 DIAGNOSIS — Z6835 Body mass index (BMI) 35.0-35.9, adult: Secondary | ICD-10-CM

## 2012-11-11 DIAGNOSIS — Z791 Long term (current) use of non-steroidal anti-inflammatories (NSAID): Secondary | ICD-10-CM

## 2012-11-11 DIAGNOSIS — E871 Hypo-osmolality and hyponatremia: Secondary | ICD-10-CM

## 2012-11-11 DIAGNOSIS — R059 Cough, unspecified: Secondary | ICD-10-CM

## 2012-11-11 DIAGNOSIS — I10 Essential (primary) hypertension: Secondary | ICD-10-CM

## 2012-11-11 DIAGNOSIS — F419 Anxiety disorder, unspecified: Secondary | ICD-10-CM

## 2012-11-11 DIAGNOSIS — I671 Cerebral aneurysm, nonruptured: Secondary | ICD-10-CM

## 2012-11-11 DIAGNOSIS — A419 Sepsis, unspecified organism: Secondary | ICD-10-CM

## 2012-11-11 DIAGNOSIS — K659 Peritonitis, unspecified: Secondary | ICD-10-CM | POA: Diagnosis present

## 2012-11-11 DIAGNOSIS — R7309 Other abnormal glucose: Secondary | ICD-10-CM

## 2012-11-11 DIAGNOSIS — J690 Pneumonitis due to inhalation of food and vomit: Secondary | ICD-10-CM

## 2012-11-11 DIAGNOSIS — Z79899 Other long term (current) drug therapy: Secondary | ICD-10-CM

## 2012-11-11 DIAGNOSIS — R062 Wheezing: Secondary | ICD-10-CM

## 2012-11-11 DIAGNOSIS — E162 Hypoglycemia, unspecified: Secondary | ICD-10-CM

## 2012-11-11 DIAGNOSIS — R490 Dysphonia: Secondary | ICD-10-CM | POA: Diagnosis present

## 2012-11-11 DIAGNOSIS — R791 Abnormal coagulation profile: Secondary | ICD-10-CM

## 2012-11-11 DIAGNOSIS — K7460 Unspecified cirrhosis of liver: Secondary | ICD-10-CM

## 2012-11-11 DIAGNOSIS — D72829 Elevated white blood cell count, unspecified: Secondary | ICD-10-CM | POA: Diagnosis present

## 2012-11-11 DIAGNOSIS — G9340 Encephalopathy, unspecified: Secondary | ICD-10-CM | POA: Diagnosis present

## 2012-11-11 DIAGNOSIS — R7401 Elevation of levels of liver transaminase levels: Secondary | ICD-10-CM

## 2012-11-11 DIAGNOSIS — R188 Other ascites: Secondary | ICD-10-CM | POA: Diagnosis present

## 2012-11-11 DIAGNOSIS — D62 Acute posthemorrhagic anemia: Secondary | ICD-10-CM

## 2012-11-11 LAB — CBC WITH DIFFERENTIAL/PLATELET
Basophils Absolute: 0 10*3/uL (ref 0.0–0.1)
Basophils Relative: 0 % (ref 0–1)
Eosinophils Absolute: 0 10*3/uL (ref 0.0–0.7)
Eosinophils Relative: 0 % (ref 0–5)
HCT: 34.3 % — ABNORMAL LOW (ref 36.0–46.0)
Hemoglobin: 11.3 g/dL — ABNORMAL LOW (ref 12.0–15.0)
Lymphocytes Relative: 9 % — ABNORMAL LOW (ref 12–46)
Lymphs Abs: 0.6 10*3/uL — ABNORMAL LOW (ref 0.7–4.0)
MCH: 31.7 pg (ref 26.0–34.0)
MCHC: 32.9 g/dL (ref 30.0–36.0)
MCV: 96.1 fL (ref 78.0–100.0)
Monocytes Absolute: 0.4 10*3/uL (ref 0.1–1.0)
Monocytes Relative: 6 % (ref 3–12)
Neutro Abs: 5.2 10*3/uL (ref 1.7–7.7)
Neutrophils Relative %: 85 % — ABNORMAL HIGH (ref 43–77)
Platelets: 168 10*3/uL (ref 150–400)
RBC: 3.57 MIL/uL — ABNORMAL LOW (ref 3.87–5.11)
RDW: 20.4 % — ABNORMAL HIGH (ref 11.5–15.5)
WBC: 6.2 10*3/uL (ref 4.0–10.5)

## 2012-11-11 LAB — COMPREHENSIVE METABOLIC PANEL
ALT: 30 U/L (ref 0–35)
AST: 81 U/L — ABNORMAL HIGH (ref 0–37)
Albumin: 1.6 g/dL — ABNORMAL LOW (ref 3.5–5.2)
Alkaline Phosphatase: 151 U/L — ABNORMAL HIGH (ref 39–117)
BUN: 9 mg/dL (ref 6–23)
CO2: 24 mEq/L (ref 19–32)
Calcium: 7.4 mg/dL — ABNORMAL LOW (ref 8.4–10.5)
Chloride: 99 mEq/L (ref 96–112)
Creatinine, Ser: 1.26 mg/dL — ABNORMAL HIGH (ref 0.50–1.10)
GFR calc Af Amer: 51 mL/min — ABNORMAL LOW (ref >90)
GFR calc non Af Amer: 44 mL/min — ABNORMAL LOW (ref >90)
Glucose, Bld: 76 mg/dL (ref 70–99)
Potassium: 4.3 mEq/L (ref 3.5–5.1)
Sodium: 131 mEq/L — ABNORMAL LOW (ref 135–145)
Total Bilirubin: 4.6 mg/dL — ABNORMAL HIGH (ref 0.3–1.2)
Total Protein: 7.2 g/dL (ref 6.0–8.3)

## 2012-11-11 LAB — URINALYSIS, ROUTINE W REFLEX MICROSCOPIC
Glucose, UA: NEGATIVE mg/dL
Hgb urine dipstick: NEGATIVE
Ketones, ur: 15 mg/dL — AB
Nitrite: POSITIVE — AB
Protein, ur: NEGATIVE mg/dL
Specific Gravity, Urine: 1.029 (ref 1.005–1.030)
Urobilinogen, UA: 1 mg/dL (ref 0.0–1.0)
pH: 5 (ref 5.0–8.0)

## 2012-11-11 LAB — URINE MICROSCOPIC-ADD ON

## 2012-11-11 LAB — LIPASE, BLOOD: Lipase: 11 U/L (ref 11–59)

## 2012-11-11 MED ORDER — LORAZEPAM 2 MG/ML IJ SOLN: 1.0000 mg | Freq: Four times a day (QID) | INTRAMUSCULAR | Status: DC | PRN

## 2012-11-11 MED ORDER — ONDANSETRON HCL 4 MG/2ML IJ SOLN
4.0000 mg | Freq: Once | INTRAMUSCULAR | Status: AC
  Administered 2012-11-11: 4 mg via INTRAVENOUS
  Filled 2012-11-11: qty 2

## 2012-11-11 MED ORDER — SODIUM CHLORIDE 0.9 % IV BOLUS (SEPSIS)
1000.0000 mL | Freq: Once | INTRAVENOUS | Status: AC
  Administered 2012-11-11: 1000 mL via INTRAVENOUS

## 2012-11-11 MED ORDER — THIAMINE HCL 100 MG/ML IJ SOLN
Freq: Once | INTRAVENOUS | Status: AC
  Administered 2012-11-11: 22:00:00 via INTRAVENOUS
  Filled 2012-11-11: qty 1000

## 2012-11-11 MED ORDER — MORPHINE SULFATE 2 MG/ML IJ SOLN
2.0000 mg | INTRAMUSCULAR | Status: DC | PRN
  Administered 2012-11-12: 2 mg via INTRAVENOUS
  Filled 2012-11-11: qty 1

## 2012-11-11 MED ORDER — MORPHINE SULFATE 4 MG/ML IJ SOLN
6.0000 mg | Freq: Once | INTRAMUSCULAR | Status: AC
  Administered 2012-11-11: 6 mg via INTRAVENOUS
  Filled 2012-11-11: qty 2

## 2012-11-11 MED ORDER — SODIUM CHLORIDE 0.9 % IV SOLN
INTRAVENOUS | Status: DC
  Administered 2012-11-12: 07:00:00 via INTRAVENOUS

## 2012-11-11 MED ORDER — LORAZEPAM 1 MG PO TABS: 1.0000 mg | ORAL_TABLET | Freq: Four times a day (QID) | ORAL | Status: DC | PRN

## 2012-11-11 MED ORDER — CHLORDIAZEPOXIDE HCL 10 MG PO CAPS
10.0000 mg | ORAL_CAPSULE | Freq: Three times a day (TID) | ORAL | Status: DC
  Administered 2012-11-11 – 2012-11-12 (×2): 10 mg via ORAL
  Filled 2012-11-11 (×2): qty 1

## 2012-11-11 MED ORDER — ONDANSETRON HCL 4 MG PO TABS: 4.0000 mg | ORAL_TABLET | Freq: Four times a day (QID) | ORAL | Status: DC | PRN

## 2012-11-11 MED ORDER — ONDANSETRON HCL 4 MG/2ML IJ SOLN: 4.0000 mg | Freq: Four times a day (QID) | INTRAMUSCULAR | Status: DC | PRN

## 2012-11-11 NOTE — ED Notes (Signed)
Pt unable to urinate- notified floor

## 2012-11-11 NOTE — ED Notes (Addendum)
Spoke with Dr. Rito Ehrlich- pt has not had any alcohol in 2 days and is having withdrawal symptoms.  MD is okay to collect urine specimen on floor via an in and out, if needed.  Conveyed this to Misty Stanley, Charity fundraiser on floor.

## 2012-11-11 NOTE — Telephone Encounter (Signed)
She really needs that [facility] procedure.  Does she want to reschedule herself or want [place] to reshcedule for her. Does she feel she needs to be seen by doctor about her weakness, nausea?

## 2012-11-11 NOTE — ED Provider Notes (Signed)
History     CSN: [id number]  Arrival date & time 11/11/12  1209   First MD Initiated Contact with Patient 11/11/12 1231      Chief Complaint  Patient presents with  . Abdominal Pain    (Consider location/radiation/quality/duration/timing/severity/associated sxs/prior treatment) HPI Patient is 67 y.o female who presents to emergency department with nausea, vomiting, and abdominal pain.  Patient has been seen several times for these problems and was scheduled for endoscopy today at [facility].  Patient woke up today extremely nauseous with 3 episodes of non-bilious, non-bloody emesis and felt unable to make it to [facility] for endoscopy.  Patient has severe generalized abdominal pain and notes lower extremity edema.  She continues to have intermittent rectal bleeding.  She denies diarrhea in past 24 hours and states she hasn't eaten anything due to scheduled endoscopy.  She denies fever, chest pain, shortness of breath, headache, blurry vision, dysuria, vaginal bleeding, vaginal discharge.   Past Medical History  Diagnosis Date  . Hypertension   . H/O brain surgery   . Aneurysm of anterior cerebral artery   . Hernia   . Anxiety   . Stroke   . Anemia   . Arthritis     Past Surgical History  Procedure Laterality Date  . Laparotomy  01/22/2012    Procedure: EXPLORATORY LAPAROTOMY;  Surgeon: Emelia Loron, MD;  Location: [facility] OR;  Service: General;  Laterality: N/A;  lysis of adhesions  . Hernia repair    . Esophagogastroduodenoscopy N/A 09/19/2012    Procedure: ESOPHAGOGASTRODUODENOSCOPY (EGD);  Surgeon: Charna Enolia, MD;  Location: [facility] ENDOSCOPY;  Service: Endoscopy;  Laterality: N/A;  . Colonoscopy N/A 09/20/2012    Procedure: COLONOSCOPY;  Surgeon: Charna Cherlyn, MD;  Location: [facility] ENDOSCOPY;  Service: Endoscopy;  Laterality: N/A;  . Givens capsule study N/A 09/21/2012    Procedure: GIVENS CAPSULE STUDY;  Surgeon: Charna Thalia, MD;  Location: [facility] ENDOSCOPY;  Service: Endoscopy;  Laterality: N/A;  .  Enteroscopy N/A 09/28/2012    Procedure: ENTEROSCOPY;  Surgeon: Rachael Fee, MD;  Location: [facility] ENDOSCOPY;  Service: Endoscopy;  Laterality: N/A;  . Upper gastrointestinal endoscopy  10/05/12  . Enteroscopy N/A 10/05/2012    Procedure: ENTEROSCOPY;  Surgeon: Meryl Dare, MD;  Location: [facility] ENDOSCOPY;  Service: Endoscopy;  Laterality: N/A;    Family History  Problem Relation Age of Onset  . Cancer Father     pancreatic  . Cancer Maternal Grandmother     breast    History  Substance Use Topics  . Smoking status: Never Smoker   . Smokeless tobacco: Never Used  . Alcohol Use: 12.0 oz/week    20 Glasses of wine per week     Comment: daily 2-3  glasses wine day     OB History   Grav Para Term Preterm Abortions TAB SAB Ect Mult Living                  Review of Systems All other systems negative except as documented in the HPI. All pertinent positives and negatives as reviewed in the HPI. Allergies  Cephalexin and Penicillins  Home Medications   Current Outpatient Rx  Name  Route  Sig  Dispense  Refill  . ALPRAZolam (XANAX) 0.25 MG tablet   Oral   Take 0.25 mg by mouth daily as needed. For anxiety         . benazepril (LOTENSIN) 20 MG tablet      TAKE 1 TABLET ONCE DAILY.   30  tablet   5   . labetalol (NORMODYNE) 100 MG tablet      TAKE 1 TABLET TWICE DAILY.   60 tablet   5   . ondansetron (ZOFRAN) 4 MG tablet   Oral   Take 4 mg by mouth every 8 (eight) hours as needed for nausea.         . pantoprazole (PROTONIX) 40 MG tablet   Oral   Take 1 tablet (40 mg total) by mouth 2 (two) times daily.   60 tablet   0   . simvastatin (ZOCOR) 40 MG tablet   Oral   Take 40 mg by mouth every evening.           BP 111/62  Pulse 116  Temp(Src) 100.1 F (37.8 C) (Oral)  Resp 25  SpO2 96%  Physical Exam  Constitutional: She is oriented to person, place, and time. She appears well-developed and well-nourished.  HENT:  Head: Normocephalic and  atraumatic.  Mouth/Throat: Oropharynx is clear and moist.  Eyes: Conjunctivae are normal. Pupils are equal, round, and reactive to light.  Neck: Neck supple.  Cardiovascular: Regular rhythm and normal heart sounds.  Tachycardia present.  Exam reveals no friction rub.   No murmur heard. Pulmonary/Chest: Effort normal and breath sounds normal.  Abdominal: Normal appearance and bowel sounds are normal. She exhibits distension. She exhibits no pulsatile midline mass. There is generalized tenderness. There is no CVA tenderness.  Lymphadenopathy:    She has no cervical adenopathy.  Neurological: She is alert and oriented to person, place, and time.  Skin: Skin is warm and dry.  Psychiatric: She has a normal mood and affect.    ED Course  Procedures (including critical care time)  Patient will need admission to the hospital for dehydration I spoke with the, [facility] will be down to see the patient for admission.  Patient remains tachycardic in the 130s upon reassessment.  Patient's husband and the patient are advised of the need for admission.  There was a delay in care due to the fact that the labs could not be drawn due to her dehydration.        Carlyle Dolly, PA-C 11/11/12 1746

## 2012-11-11 NOTE — H&P (Signed)
[facility] History and Physical  RODA LAUTURE NGE:[id number] DOB: [birth year] DOA: 11/11/2012  Referring physician: Thayer Ohm lawyer, ER PA PCP: Judie Petit, MD  Specialists: None. Has previously been followed by [facility]  Chief Complaint: Nausea and vomiting  HPI: [name] [name] is a 67 y.o. female  With past medical history of chronic abdominal pain with recurrent nausea and vomiting as well as alcohol abuse. The patient has been admitted multiple times for nausea and vomiting and recurrent bleeds. She has a scheduled appointment at [facility] today however on which and having significant nausea and vomiting unretractable she came into the emergency room here for further evaluation. In the emergency room she was noted to have a normal white count and normal lipase level, but minimal elevation creatinine secondary dehydration as well as elevation in AST, alkaline phosphatase and a bilirubin of 4.6, one week ago was down to 1.5. She was also to be tachycardic with a heart rate in the 130s. Even after IV fluids, she remained that way. She's given medicine for pain and nausea and is slightly drowsy but feeling a little bit better. Her vomiting has since stopped.  Review of Systems: Patient seen down emergency room. Denies any headaches, vision changes, dysphasia. Denies any chest pain or shortness of breath or wheezing or cough. Complains of generalized, pain, fatigue, nausea and vomiting. Denies any diarrhea or constipation. Denies any focal steady numbness or weakness or pain. Review systems otherwise negative.  Past Medical History  Diagnosis Date  . Hypertension   . H/O brain surgery   . Aneurysm of anterior cerebral artery   . Hernia   . Anxiety   . Stroke   . Anemia   . Arthritis    Past Surgical History  Procedure Laterality Date  . Laparotomy  01/22/2012    Procedure: EXPLORATORY LAPAROTOMY;  Surgeon: Emelia Loron, MD;  Location: [facility] OR;  Service: General;   Laterality: N/A;  lysis of adhesions  . Hernia repair    . Esophagogastroduodenoscopy N/A 09/19/2012    Procedure: ESOPHAGOGASTRODUODENOSCOPY (EGD);  Surgeon: Charna Tajuana, MD;  Location: [facility] ENDOSCOPY;  Service: Endoscopy;  Laterality: N/A;  . Colonoscopy N/A 09/20/2012    Procedure: COLONOSCOPY;  Surgeon: Charna Rowyn, MD;  Location: [facility] ENDOSCOPY;  Service: Endoscopy;  Laterality: N/A;  . Givens capsule study N/A 09/21/2012    Procedure: GIVENS CAPSULE STUDY;  Surgeon: Charna Marcey, MD;  Location: [facility] ENDOSCOPY;  Service: Endoscopy;  Laterality: N/A;  . Enteroscopy N/A 09/28/2012    Procedure: ENTEROSCOPY;  Surgeon: Rachael Fee, MD;  Location: [facility] ENDOSCOPY;  Service: Endoscopy;  Laterality: N/A;  . Upper gastrointestinal endoscopy  10/05/12  . Enteroscopy N/A 10/05/2012    Procedure: ENTEROSCOPY;  Surgeon: Meryl Dare, MD;  Location: [facility] ENDOSCOPY;  Service: Endoscopy;  Laterality: N/A;   Social History:  reports that she has never smoked. She has never used smokeless tobacco. She reports that she drinks about 12.0 ounces of alcohol per week. She reports that she does not use illicit drugs. Patient was at home with her husband. She has a hard time getting around and uses him for assistance. Currently she has no "in the home to help her get around.  Allergies  Allergen Reactions  . Cephalexin Rash  . Penicillins Rash    Family History  Problem Relation Age of Onset  . Cancer Father     pancreatic  . Cancer Maternal Grandmother     breast    Prior to Admission medications  Medication Sig Start Date End Date Taking? Authorizing Provider  ALPRAZolam (XANAX) 0.25 MG tablet Take 0.25 mg by mouth daily as needed. For anxiety   Yes Historical Provider, MD  benazepril (LOTENSIN) 20 MG tablet TAKE 1 TABLET ONCE DAILY. 10/05/12  Yes Bruce Romilda Garret, MD  labetalol (NORMODYNE) 100 MG tablet TAKE 1 TABLET TWICE DAILY. 10/05/12  Yes Bruce Romilda Garret, MD  ondansetron (ZOFRAN) 4 MG tablet Take 4 mg by  mouth every 8 (eight) hours as needed for nausea. 11/04/12  Yes John L Molpus, MD  pantoprazole (PROTONIX) 40 MG tablet Take 1 tablet (40 mg total) by mouth 2 (two) times daily. 09/22/12  Yes Shanker Levora Dredge, MD  simvastatin (ZOCOR) 40 MG tablet Take 40 mg by mouth every evening.   Yes Historical Provider, MD   Physical Exam: Filed Vitals:   11/11/12 1225 11/11/12 1313  BP: 111/62   Pulse: 116   Temp: 100.1 F (37.8 C) 100.1 F (37.8 C)  TempSrc: Oral Rectal  Resp: 25   Height:  5\' 5"  (1.651 m)  Weight:  102.059 kg (225 lb)  SpO2: 96% 95%     General:  Slightly drowsy, fatigue, looks older than stated age and mild distress secondary to abdominal pain  Eyes: Sclera nonicteric, extraocular movements are intact  ENT: Normocephalic, atraumatic, mucous members are dry  Neck: Neck is thick, no JVD  Cardiovascular: Regular rhythm, tachycardic  Respiratory: Decreased breath sounds throughout secondary to body habitus  Abdomen: Soft, morbidly obese, nontender, few bowel sounds  Skin: No skin breaks, tears or lesions  Musculoskeletal: No clubbing or cyanosis, 1+ pitting edema from the knees down bilaterally  Psychiatric: Patient is appropriate no evidence of psychoses  Neurologic: No overt deficits  Labs on Admission:  Basic Metabolic Panel:  Recent Labs Lab 11/11/12 1530  NA 131*  K 4.3  CL 99  CO2 24  GLUCOSE 76  BUN 9  CREATININE 1.26*  CALCIUM 7.4*   Liver Function Tests:  Recent Labs Lab 11/11/12 1530  AST 81*  ALT 30  ALKPHOS 151*  BILITOT 4.6*  PROT 7.2  ALBUMIN 1.6*    Recent Labs Lab 11/11/12 1530  LIPASE 11   CBC:  Recent Labs Lab 11/11/12 1530  WBC 6.2  NEUTROABS 5.2  HGB 11.3*  HCT 34.3*  MCV 96.1  PLT 168    Radiological Exams on Admission: Have ordered portable abdominal x-ray results pending   Assessment/Plan Principal Problem:   Alcohol withdrawal: Patient submits to heavy drinking and her husband downplays exactly  how much she drinks. She's not had a drink now in over 2 days. Given persistent tachycardia, abdominal pain with nausea and vomiting, highly suspicious for early alcohol withdrawal. We'll plan to put her on when necessary IV Ativan scheduled Librium. And supplementation. Counseled the patient.  Active Problems:   HYPERLIPIDEMIA  Hyperbilirubinemia: Doubt very much this is obstruction because her pain is generalized. In addition, transaminases inconsistently elevated with AST only, more consistent with alcohol use. In addition, lipase level normal. Would favor rechecking labs in the morning. This is likely more from fatty infiltration of the liver. If it is persistently elevated, that time can consult GI nevertheless, we'll check an abdominal x-ray and of course a stat Tylenol level.    HYPERTENSION: Watch for increased blood pressures given withdrawal   Alcohol abuse: See above.    Hyponatremia: In part from dehydration, in part alcohol. IV fluids and recheck tomorrow    Nausea & vomiting: Suspect  alcohol withdrawal. We'll check abdominal x-ray.    Abdominal pain, generalized   Obesity   ARF (acute renal failure): Secondary to dehydration. Mild. Hydrate and recheck labs in the morning.    Alcoholic fatty liver: Noted   Coagulopathy: Not on anticoagulation. Secondary to chronic liver disease, likely from alcohol. Use SCDs    Anxiety: When necessary Xanax    Code Status: Full code  Family Communication: Plan discuss with patient and her husband at the bedside Disposition Plan: Home in the next one to 2 days  Time spent: 35 minutes  Hollice Espy [facility] Pager [phone]  If 7PM-7AM, please contact night-coverage [email or web address] Password [facility] 11/11/2012, 7:35 PM

## 2012-11-11 NOTE — Telephone Encounter (Signed)
I called and spoke with the patient's husband, he reports that the patient was too weak to travel to [facility] this am for the spiral enteroscopy with Dr. Theodore Demark.  He says that he can't arrange transportation due to her weakness and they did not go to the appt.  .  He spoke with Dr. Frederich Cha office this am and they have rescheduled her for 6 more weeks.  He wants our practice to arrange a more urgent appt as "she can't wait another 6 weeks".  I have explained to the patient that this took several weeks to arrange and that we are not able to make [facility] work her back in.  He states that he can't get her to [facility] and asked if there were services that would take her.  I have explained to him that there are services that can transport patients in [place], but I was not aware of any that would transport to [place].  I further explained that she would need to be able to walk as theses are not ambulance services ([facility]).  I discussed this with Dr. Christella Hartigan and he said if the patient was that weak and couldn't get in a car she needed to go to the hospital by ambulance.  He wants the ambulance to transport her to [facility].  I have explained that it is unlikely they will do that, but she could be sent to [facility] and if needed they may be able to arrange hospital to hospital transfer.  Patient's husband was upset that we don't have any more resources or answers for how to get her to [facility] and wants [place] to get her worked in urgently with Dr. Frederich Cha office.  "I heard medical care in [place] and the [place] was bad, but this is ridiculous"   I have explained to him that the procdure she requires is only performed at a teaching center and took a lot of coordination between our physicians and Dr. Faye Ramsay office to arrange and expedite.  I explained that had he notified us earlier in the week that he was having transportation problems we would have had time to work on it, but nothing can be done today.  The husband is advised that she will need to go  back to the hospital by ambulance if he is not able to move her or she is too weak.  He stated "thanks for nothing" and hung up on me.

## 2012-11-11 NOTE — ED Notes (Signed)
Phlebotomy attempt x2 unsuccessful. RN and PA made aware

## 2012-11-11 NOTE — ED Notes (Signed)
Per EMS, c/o abdominal pain x 6 months, here at ED last week and set up appointment with [facility] for today, pt states was too sick to go to [facility] today so called EMS, pt vomiting last night, pain 10/10,

## 2012-11-11 NOTE — ED Notes (Signed)
Around 1840- reported continued tachycardia (130) to C. Lawyer, PA.

## 2012-11-11 NOTE — ED Notes (Signed)
WUJ:[id number]<JY> Expected date:<BR> Expected time:<BR> Means of arrival:<BR> Comments:<BR> ems abdominal pain

## 2012-11-11 NOTE — Telephone Encounter (Signed)
Pt was scheduled for enteroscopy at [facility] this am but husband claims his wife was too sick to go.  C/o weakness, nausea.  She's had similar symptoms for weeks.  Since it is just prior to office opening I told them that I would check office records and have call them back.  The patient's husband claims that she is a patient of Dr. Anselm Jungling, however, upon reviewing the office chart I see that she was seen by our nurse practitioner and supervised by Dr. Christella Hartigan

## 2012-11-12 ENCOUNTER — Inpatient Hospital Stay (HOSPITAL_COMMUNITY): Payer: BC Managed Care – PPO

## 2012-11-12 ENCOUNTER — Encounter (HOSPITAL_COMMUNITY)
Admission: EM | Disposition: A | Discharge status: Skilled Nursing Facility | Payer: Self-pay | Source: Home / Self Care | Attending: Pulmonary Disease

## 2012-11-12 ENCOUNTER — Encounter (HOSPITAL_COMMUNITY): Payer: Self-pay | Admitting: Anesthesiology

## 2012-11-12 ENCOUNTER — Inpatient Hospital Stay (HOSPITAL_COMMUNITY): Payer: BC Managed Care – PPO | Admitting: Anesthesiology

## 2012-11-12 DIAGNOSIS — K7010 Alcoholic hepatitis without ascites: Secondary | ICD-10-CM

## 2012-11-12 DIAGNOSIS — F1010 Alcohol abuse, uncomplicated: Secondary | ICD-10-CM

## 2012-11-12 DIAGNOSIS — K631 Perforation of intestine (nontraumatic): Secondary | ICD-10-CM

## 2012-11-12 DIAGNOSIS — J690 Pneumonitis due to inhalation of food and vomit: Secondary | ICD-10-CM | POA: Diagnosis present

## 2012-11-12 DIAGNOSIS — R05 Cough: Secondary | ICD-10-CM | POA: Diagnosis present

## 2012-11-12 DIAGNOSIS — K660 Peritoneal adhesions (postprocedural) (postinfection): Secondary | ICD-10-CM

## 2012-11-12 DIAGNOSIS — K567 Ileus, unspecified: Secondary | ICD-10-CM | POA: Diagnosis present

## 2012-11-12 DIAGNOSIS — E860 Dehydration: Secondary | ICD-10-CM

## 2012-11-12 DIAGNOSIS — S31109A Unspecified open wound of abdominal wall, unspecified quadrant without penetration into peritoneal cavity, initial encounter: Secondary | ICD-10-CM

## 2012-11-12 DIAGNOSIS — D62 Acute posthemorrhagic anemia: Secondary | ICD-10-CM

## 2012-11-12 DIAGNOSIS — R7401 Elevation of levels of liver transaminase levels: Secondary | ICD-10-CM

## 2012-11-12 DIAGNOSIS — R112 Nausea with vomiting, unspecified: Secondary | ICD-10-CM

## 2012-11-12 DIAGNOSIS — R062 Wheezing: Secondary | ICD-10-CM | POA: Diagnosis present

## 2012-11-12 DIAGNOSIS — D649 Anemia, unspecified: Secondary | ICD-10-CM | POA: Diagnosis present

## 2012-11-12 DIAGNOSIS — K668 Other specified disorders of peritoneum: Secondary | ICD-10-CM

## 2012-11-12 DIAGNOSIS — R1084 Generalized abdominal pain: Secondary | ICD-10-CM

## 2012-11-12 DIAGNOSIS — R791 Abnormal coagulation profile: Secondary | ICD-10-CM

## 2012-11-12 DIAGNOSIS — R933 Abnormal findings on diagnostic imaging of other parts of digestive tract: Secondary | ICD-10-CM

## 2012-11-12 DIAGNOSIS — K700 Alcoholic fatty liver: Secondary | ICD-10-CM

## 2012-11-12 DIAGNOSIS — N179 Acute kidney failure, unspecified: Secondary | ICD-10-CM

## 2012-11-12 DIAGNOSIS — E162 Hypoglycemia, unspecified: Secondary | ICD-10-CM | POA: Diagnosis present

## 2012-11-12 DIAGNOSIS — K766 Portal hypertension: Secondary | ICD-10-CM

## 2012-11-12 DIAGNOSIS — J95821 Acute postprocedural respiratory failure: Secondary | ICD-10-CM

## 2012-11-12 DIAGNOSIS — R0600 Dyspnea, unspecified: Secondary | ICD-10-CM | POA: Diagnosis present

## 2012-11-12 HISTORY — PX: LAPAROTOMY: SHX154

## 2012-11-12 LAB — COMPREHENSIVE METABOLIC PANEL
ALT: 17 U/L (ref 0–35)
Alkaline Phosphatase: 81 U/L (ref 39–117)
BUN: 13 mg/dL (ref 6–23)
BUN: 14 mg/dL (ref 6–23)
CO2: 21 mEq/L (ref 19–32)
CO2: 22 mEq/L (ref 19–32)
Calcium: 7.1 mg/dL — ABNORMAL LOW (ref 8.4–10.5)
Chloride: 103 mEq/L (ref 96–112)
Chloride: 104 mEq/L (ref 96–112)
Creatinine, Ser: 2.2 mg/dL — ABNORMAL HIGH (ref 0.50–1.10)
GFR calc Af Amer: 26 mL/min — ABNORMAL LOW (ref >90)
GFR calc Af Amer: 24 mL/min — ABNORMAL LOW (ref >90)
GFR calc non Af Amer: 22 mL/min — ABNORMAL LOW (ref >90)
Glucose, Bld: 59 mg/dL — ABNORMAL LOW (ref 70–99)
Glucose, Bld: 121 mg/dL — ABNORMAL HIGH (ref 70–99)
Potassium: 4 mEq/L (ref 3.5–5.1)
Sodium: 137 mEq/L (ref 135–145)
Total Bilirubin: 4.9 mg/dL — ABNORMAL HIGH (ref 0.3–1.2)
Total Bilirubin: 3.9 mg/dL — ABNORMAL HIGH (ref 0.3–1.2)

## 2012-11-12 LAB — BLOOD GAS, ARTERIAL
Acid-base deficit: 5.2 mmol/L — ABNORMAL HIGH (ref 0.0–2.0)
Bicarbonate: 20.4 mEq/L (ref 20.0–24.0)
FIO2: 1 %
O2 Saturation: 99.3 %
PEEP: 5 cmH2O
Patient temperature: 98.6
RATE: 18 resp/min
pO2, Arterial: 261 mmHg — ABNORMAL HIGH (ref 80.0–100.0)

## 2012-11-12 LAB — CBC
HCT: 27.7 % — ABNORMAL LOW (ref 36.0–46.0)
Hemoglobin: 9 g/dL — ABNORMAL LOW (ref 12.0–15.0)
MCH: 30.7 pg (ref 26.0–34.0)
Platelets: 158 10*3/uL (ref 150–400)
RBC: 3.39 MIL/uL — ABNORMAL LOW (ref 3.87–5.11)
RBC: 2.87 MIL/uL — ABNORMAL LOW (ref 3.87–5.11)
WBC: 15.2 10*3/uL — ABNORMAL HIGH (ref 4.0–10.5)
WBC: 10.7 10*3/uL — ABNORMAL HIGH (ref 4.0–10.5)

## 2012-11-12 LAB — GLUCOSE, CAPILLARY
Glucose-Capillary: 57 mg/dL — ABNORMAL LOW (ref 70–99)
Glucose-Capillary: 59 mg/dL — ABNORMAL LOW (ref 70–99)
Glucose-Capillary: 120 mg/dL — ABNORMAL HIGH (ref 70–99)
Glucose-Capillary: 128 mg/dL — ABNORMAL HIGH (ref 70–99)

## 2012-11-12 LAB — GAMMA GT: GGT: 262 U/L — ABNORMAL HIGH (ref 7–51)

## 2012-11-12 LAB — PROTIME-INR
INR: 3.23 — ABNORMAL HIGH (ref 0.00–1.49)
Prothrombin Time: 20.7 seconds — ABNORMAL HIGH (ref 11.6–15.2)

## 2012-11-12 LAB — PREPARE RBC (CROSSMATCH)

## 2012-11-12 SURGERY — LAPAROTOMY, EXPLORATORY
Anesthesia: General | Site: Abdomen | Wound class: Dirty or Infected

## 2012-11-12 MED ORDER — DEXTROSE-NACL 5-0.9 % IV SOLN
INTRAVENOUS | Status: DC
  Administered 2012-11-12: 09:00:00 via INTRAVENOUS

## 2012-11-12 MED ORDER — SUCCINYLCHOLINE CHLORIDE 20 MG/ML IJ SOLN
INTRAMUSCULAR | Status: DC | PRN
  Administered 2012-11-12: 120 mg via INTRAVENOUS

## 2012-11-12 MED ORDER — CIPROFLOXACIN IN D5W 400 MG/200ML IV SOLN
INTRAVENOUS | Status: AC
  Filled 2012-11-12: qty 200

## 2012-11-12 MED ORDER — CISATRACURIUM BESYLATE (PF) 10 MG/5ML IV SOLN
INTRAVENOUS | Status: DC | PRN
  Administered 2012-11-12: 4 mg via INTRAVENOUS
  Administered 2012-11-12: 10 mg via INTRAVENOUS

## 2012-11-12 MED ORDER — VITAMIN K1 10 MG/ML IJ SOLN
5.0000 mg | Freq: Once | INTRAVENOUS | Status: AC
  Administered 2012-11-12: 5 mg via INTRAVENOUS
  Filled 2012-11-12: qty 0.5

## 2012-11-12 MED ORDER — SODIUM CHLORIDE 0.9 % IV SOLN
INTRAVENOUS | Status: AC
  Filled 2012-11-12: qty 1

## 2012-11-12 MED ORDER — KETAMINE HCL 10 MG/ML IJ SOLN
INTRAMUSCULAR | Status: DC | PRN
  Administered 2012-11-12: 20 mg via INTRAVENOUS

## 2012-11-12 MED ORDER — LACTATED RINGERS IV SOLN
INTRAVENOUS | Status: DC | PRN
  Administered 2012-11-12: 14:00:00 via INTRAVENOUS

## 2012-11-12 MED ORDER — BIOTENE DRY MOUTH MT LIQD
1.0000 "application " | Freq: Four times a day (QID) | OROMUCOSAL | Status: DC
  Administered 2012-11-13 – 2012-11-20 (×30): 15 mL via OROMUCOSAL

## 2012-11-12 MED ORDER — SODIUM CHLORIDE 0.9 % IV SOLN
1.0000 g | INTRAVENOUS | Status: DC
  Administered 2012-11-13 – 2012-11-19 (×7): 1 g via INTRAVENOUS
  Filled 2012-11-12 (×8): qty 1

## 2012-11-12 MED ORDER — SODIUM CHLORIDE 0.9 % IV SOLN
INTRAVENOUS | Status: DC | PRN
  Administered 2012-11-12 (×2): via INTRAVENOUS

## 2012-11-12 MED ORDER — SODIUM CHLORIDE 0.9 % IV BOLUS (SEPSIS)
1000.0000 mL | Freq: Once | INTRAVENOUS | Status: AC
  Administered 2012-11-12: 1000 mL via INTRAVENOUS

## 2012-11-12 MED ORDER — DEXTROSE 5 % IV SOLN
1.0000 g | Freq: Three times a day (TID) | INTRAVENOUS | Status: DC
  Administered 2012-11-12 – 2012-11-13 (×2): 1 g via INTRAVENOUS
  Filled 2012-11-12 (×3): qty 1

## 2012-11-12 MED ORDER — DEXTROSE 50 % IV SOLN: 25.0000 mL | Freq: Once | INTRAVENOUS | Status: AC | PRN

## 2012-11-12 MED ORDER — CHLORHEXIDINE GLUCONATE 0.12 % MT SOLN
15.0000 mL | Freq: Two times a day (BID) | OROMUCOSAL | Status: DC
  Administered 2012-11-12 – 2012-11-13 (×3): 15 mL via OROMUCOSAL
  Filled 2012-11-12 (×3): qty 15

## 2012-11-12 MED ORDER — METRONIDAZOLE IN NACL 5-0.79 MG/ML-% IV SOLN
500.0000 mg | Freq: Three times a day (TID) | INTRAVENOUS | Status: DC
  Administered 2012-11-12 – 2012-11-13 (×3): 500 mg via INTRAVENOUS
  Filled 2012-11-12 (×5): qty 100

## 2012-11-12 MED ORDER — LEVOFLOXACIN IN D5W 500 MG/100ML IV SOLN
500.0000 mg | Freq: Once | INTRAVENOUS | Status: AC
  Administered 2012-11-12: 500 mg via INTRAVENOUS
  Filled 2012-11-12: qty 100

## 2012-11-12 MED ORDER — THIAMINE HCL 100 MG/ML IJ SOLN
100.0000 mg | Freq: Every day | INTRAMUSCULAR | Status: DC
  Administered 2012-11-12 – 2012-11-23 (×11): 100 mg via INTRAVENOUS
  Filled 2012-11-12 (×12): qty 1

## 2012-11-12 MED ORDER — VITAMIN K1 10 MG/ML IJ SOLN
5.0000 mg | Freq: Once | INTRAVENOUS | Status: AC
  Administered 2012-11-13: 5 mg via INTRAVENOUS
  Filled 2012-11-12: qty 0.5

## 2012-11-12 MED ORDER — CIPROFLOXACIN IN D5W 400 MG/200ML IV SOLN
400.0000 mg | Freq: Two times a day (BID) | INTRAVENOUS | Status: DC
  Filled 2012-11-12: qty 200

## 2012-11-12 MED ORDER — FOLIC ACID 5 MG/ML IJ SOLN
1.0000 mg | Freq: Every day | INTRAMUSCULAR | Status: DC
  Administered 2012-11-12 – 2012-11-23 (×11): 1 mg via INTRAVENOUS
  Filled 2012-11-12 (×13): qty 0.2

## 2012-11-12 MED ORDER — PROPOFOL 10 MG/ML IV EMUL
INTRAVENOUS | Status: DC | PRN
  Administered 2012-11-12: 150 mg via INTRAVENOUS

## 2012-11-12 MED ORDER — SODIUM CHLORIDE 0.9 % IV SOLN
1.0000 g | INTRAVENOUS | Status: DC | PRN
  Administered 2012-11-12: 1 g via INTRAVENOUS

## 2012-11-12 MED ORDER — SODIUM CHLORIDE 0.9 % IV SOLN
INTRAVENOUS | Status: DC
  Administered 2012-11-12 (×2): via INTRAVENOUS
  Administered 2012-11-14: 1000 mL via INTRAVENOUS

## 2012-11-12 MED ORDER — SODIUM CHLORIDE 0.9 % IV SOLN
1.0000 mg/h | INTRAVENOUS | Status: DC
  Administered 2012-11-12: 3 mg/h via INTRAVENOUS
  Administered 2012-11-12: 2 mg/h via INTRAVENOUS
  Administered 2012-11-13 – 2012-11-14 (×2): 3 mg/h via INTRAVENOUS
  Administered 2012-11-15: 4 mg/h via INTRAVENOUS
  Filled 2012-11-12 (×5): qty 10

## 2012-11-12 MED ORDER — PHENYLEPHRINE HCL 10 MG/ML IJ SOLN
30.0000 ug/min | INTRAVENOUS | Status: DC
  Filled 2012-11-12: qty 1

## 2012-11-12 MED ORDER — SODIUM CHLORIDE 0.9 % IV SOLN
1.0000 g | INTRAVENOUS | Status: DC
  Filled 2012-11-12: qty 1

## 2012-11-12 MED ORDER — PHENYLEPHRINE HCL 10 MG/ML IJ SOLN
10000.0000 ug | INTRAVENOUS | Status: DC | PRN
  Administered 2012-11-12: 40 ug/min via INTRAVENOUS

## 2012-11-12 MED ORDER — ONDANSETRON HCL 4 MG/2ML IJ SOLN
4.0000 mg | Freq: Four times a day (QID) | INTRAMUSCULAR | Status: DC | PRN
  Administered 2012-11-23: 4 mg via INTRAVENOUS
  Filled 2012-11-12: qty 2

## 2012-11-12 MED ORDER — GLUCOSE 40 % PO GEL: 1.0000 | ORAL | Status: DC | PRN

## 2012-11-12 MED ORDER — FENTANYL CITRATE 0.05 MG/ML IJ SOLN
INTRAMUSCULAR | Status: DC | PRN
  Administered 2012-11-12 (×3): 50 ug via INTRAVENOUS

## 2012-11-12 MED ORDER — DEXTROSE 50 % IV SOLN
INTRAVENOUS | Status: AC
  Administered 2012-11-12: 25 mL via INTRAVENOUS
  Filled 2012-11-12: qty 50

## 2012-11-12 MED ORDER — PANTOPRAZOLE SODIUM 40 MG IV SOLR: 40.0000 mg | INTRAVENOUS | Status: DC

## 2012-11-12 MED ORDER — LEVOFLOXACIN IN D5W 250 MG/50ML IV SOLN: 250.0000 mg | INTRAVENOUS | Status: DC

## 2012-11-12 MED ORDER — FENTANYL CITRATE 0.05 MG/ML IJ SOLN: INTRAMUSCULAR | Status: DC | PRN

## 2012-11-12 MED ORDER — GLUCOSE 40 % PO GEL
ORAL | Status: AC
  Administered 2012-11-12: 37.5 g via ORAL
  Filled 2012-11-12: qty 1

## 2012-11-12 MED ORDER — LACTATED RINGERS IV SOLN
INTRAVENOUS | Status: DC | PRN
  Administered 2012-11-12: 15:00:00 via INTRAVENOUS

## 2012-11-12 MED ORDER — ONDANSETRON HCL 4 MG PO TABS: 4.0000 mg | ORAL_TABLET | Freq: Four times a day (QID) | ORAL | Status: DC | PRN

## 2012-11-12 MED ORDER — PANTOPRAZOLE SODIUM 40 MG IV SOLR
40.0000 mg | Freq: Two times a day (BID) | INTRAVENOUS | Status: DC
  Administered 2012-11-12 – 2012-11-23 (×21): 40 mg via INTRAVENOUS
  Filled 2012-11-12 (×23): qty 40

## 2012-11-12 MED ORDER — MIDAZOLAM HCL 5 MG/5ML IJ SOLN
INTRAMUSCULAR | Status: DC | PRN
  Administered 2012-11-12 (×2): 2 mg via INTRAVENOUS

## 2012-11-12 MED ORDER — SODIUM CHLORIDE 0.9 % IV SOLN
25.0000 ug/h | INTRAVENOUS | Status: DC
  Administered 2012-11-12: 50 ug/h via INTRAVENOUS
  Administered 2012-11-14: 75 ug/h via INTRAVENOUS
  Filled 2012-11-12 (×2): qty 50

## 2012-11-12 MED ORDER — HYDROMORPHONE HCL PF 1 MG/ML IJ SOLN
1.0000 mg | INTRAMUSCULAR | Status: DC | PRN
  Administered 2012-11-17: 2 mg via INTRAVENOUS
  Administered 2012-11-18: 1 mg via INTRAVENOUS
  Administered 2012-11-18: 2 mg via INTRAVENOUS
  Administered 2012-11-18: 1 mg via INTRAVENOUS
  Administered 2012-11-19: 2 mg via INTRAVENOUS
  Administered 2012-11-19: 1 mg via INTRAVENOUS
  Filled 2012-11-12 (×2): qty 2
  Filled 2012-11-12 (×6): qty 1

## 2012-11-12 MED ORDER — NOREPINEPHRINE BITARTRATE 1 MG/ML IJ SOLN
2.0000 ug/min | INTRAVENOUS | Status: DC
  Administered 2012-11-13: 8 ug/min via INTRAVENOUS
  Administered 2012-11-13 (×2): 10 ug/min via INTRAVENOUS
  Administered 2012-11-13: 4 ug/min via INTRAVENOUS
  Filled 2012-11-12 (×2): qty 4

## 2012-11-12 MED ORDER — LIDOCAINE HCL (CARDIAC) 20 MG/ML IV SOLN
INTRAVENOUS | Status: DC | PRN
  Administered 2012-11-12: 30 mg via INTRAVENOUS

## 2012-11-12 SURGICAL SUPPLY — 41 items
APPLICATOR COTTON TIP 6IN STRL (MISCELLANEOUS) ×2 IMPLANT
BLADE EXTENDED COATED 6.5IN (ELECTRODE) ×1 IMPLANT
BLADE HEX COATED 2.75 (ELECTRODE) ×2 IMPLANT
CANISTER SUCTION 2500CC (MISCELLANEOUS) ×2 IMPLANT
CLOTH BEACON ORANGE TIMEOUT ST (SAFETY) ×2 IMPLANT
COVER MAYO STAND STRL (DRAPES) ×1 IMPLANT
DRAPE LAPAROSCOPIC ABDOMINAL (DRAPES) ×2 IMPLANT
DRAPE WARM FLUID 44X44 (DRAPE) ×1 IMPLANT
DRSG VAC ATS MED SENSATRAC (GAUZE/BANDAGES/DRESSINGS) ×1 IMPLANT
ELECT REM PT RETURN 9FT ADLT (ELECTROSURGICAL) ×2
ELECTRODE REM PT RTRN 9FT ADLT (ELECTROSURGICAL) ×1 IMPLANT
GLOVE BIOGEL PI IND STRL 7.0 (GLOVE) ×1 IMPLANT
GLOVE BIOGEL PI INDICATOR 7.0 (GLOVE) ×1
GLOVE EUDERMIC 7 POWDERFREE (GLOVE) ×2 IMPLANT
GOWN STRL NON-REIN LRG LVL3 (GOWN DISPOSABLE) ×2 IMPLANT
GOWN STRL REIN XL XLG (GOWN DISPOSABLE) ×4 IMPLANT
KIT BASIN OR (CUSTOM PROCEDURE TRAY) ×2 IMPLANT
LIGASURE IMPACT 36 18CM CVD LR (INSTRUMENTS) ×1 IMPLANT
MANIFOLD NEPTUNE II (INSTRUMENTS) ×1 IMPLANT
NS IRRIG 1000ML POUR BTL (IV SOLUTION) ×2 IMPLANT
PACK GENERAL/GYN (CUSTOM PROCEDURE TRAY) ×2 IMPLANT
SLEEVE SURGEON STRL (DRAPES) ×3 IMPLANT
SPONGE GAUZE 4X4 12PLY (GAUZE/BANDAGES/DRESSINGS) ×1 IMPLANT
SPONGE LAP 18X18 X RAY DECT (DISPOSABLE) ×7 IMPLANT
STAPLER VISISTAT 35W (STAPLE) ×2 IMPLANT
SUCTION POOLE TIP (SUCTIONS) ×1 IMPLANT
SUT NOVA 1 T20/GS 25DT (SUTURE) ×3 IMPLANT
SUT PDS AB 1 CTX 36 (SUTURE) IMPLANT
SUT PDS AB 1 TP1 96 (SUTURE) ×4 IMPLANT
SUT SILK 2 0 (SUTURE)
SUT SILK 2 0 SH CR/8 (SUTURE) ×2 IMPLANT
SUT SILK 2-0 18XBRD TIE 12 (SUTURE) IMPLANT
SUT SILK 3 0 (SUTURE)
SUT SILK 3 0 SH CR/8 (SUTURE) ×1 IMPLANT
SUT SILK 3-0 18XBRD TIE 12 (SUTURE) IMPLANT
SUT VICRYL 2 0 18  UND BR (SUTURE)
SUT VICRYL 2 0 18 UND BR (SUTURE) IMPLANT
TOWEL OR 17X26 10 PK STRL BLUE (TOWEL DISPOSABLE) ×4 IMPLANT
TRAY FOLEY CATH 14FRSI W/METER (CATHETERS) IMPLANT
TUBING CONNECTING 10 (TUBING) ×1 IMPLANT
YANKAUER SUCT BULB TIP NO VENT (SUCTIONS) IMPLANT

## 2012-11-12 NOTE — Progress Notes (Signed)
Patient with increased congestion and cough.  Dr. Darnelle Catalan notified and order received for chest x-ray.  Allayne Butcher [facility]  11/12/2012

## 2012-11-12 NOTE — Op Note (Signed)
Patient Name:           [name] [name]   Date of Surgery:        11/12/2012  Pre op Diagnosis:      Perforated viscus   Post op Diagnosis:    Perforation of jejunum of uncertain etiology with diffuse peritonitis, extensive intra-abdominal adhesions, coagulopathy, cirrhosis with ascites  Procedure:                 Exploratory laparotomy, debridement of abdominal wall tissue 2 cm x 8 cm, extensive lysis of adhesions requiring 45 minutes, repair small bowel perforation, enterorrhaphy x1, placement of negative pressure dressing for open abdomen.  Surgeon:                     Angelia Mould. Derrell Lolling, M.D., FACS  Assistant:                      Lodema Pilot, MD  Operative Indications:   Basically this is a high-risk patient with morbid obesity,  coagulopathy, alcohol abuse, liver disease and abnormal liver function tests. Years ago she underwent umbilical hernia repair by Dr. Leonie Man. In 2008 she underwent repair of strangulated umbilical hernia, small bowel resection . In 2013 she underwent laparotomy and lysis of adhesions through the midline incision, by Dr. Dwain Sarna according to the patient's husband.. She was hospitalized earlier this year with a GI bleed. Capsule endoscopy and enteroscopy suggested a metallic foreign body embedded in the wall of the digit mid jejunum as a possible source although active bleeding was not seen. She developed nausea and vomiting and weakness recently and was brought to the ED yesterday. She was noted to be tachycardic and dehydrated. Her abdominal pain has become more severe.Decubitus abdominal x-rays show free intraperitoneal air consistent with bowel perforation.  Her abdominal exam shows multiple scars, possibly a small hernia in the upper right epigastrium but nothing incarcerated. She has diffuse guarding, tenderness, and rebound. Nonlocalizing. No inguinal hernias noted.INR drawn immediately preop was 3.23.   Operative Findings:       The abdominal wall had  extensive fat necrosis and what looked like some type of biologic mesh. This had to be debrided.  There were extensive adhesions from the subxiphoid area to below the umbilicus, butthe pelvis was free. It took 45 minutes take these adhesions down and we made one partial-thickness serosal defect during the dissection which had to be repaired. We found a single perforation of the mid jejunum on the antimesenteric border. This did not appear malignant. We did not feel a foreign body. This was simply repaired. The prior small bowel anastomosis was noted to be proximal to this area. The rest of the small bowel was extremely edematous from the peritonitis but there were no other injuries and it appeared well perfused. We did run the small bowel from the ligament of Treitz to the ileocecal valve. The colon appeared viable but appeared somewhat yellowish, possibly from her hyperbilirubinemia. There was no evidence of any perforation of the colon or proximal rectum. The liver was rock hard. The gallbladder was present. The stomach was massively distended with air and fluid. The spleen was firm but of normal size. She bled a great deal during the start of the case which required numerous maneuvers to control the bleeding. At the end of the case the bleeding was much better controlled but the small bowel was extremely edematous, and we did not feel that we could  do a primary closure without causing a compartment syndrome.  Procedure in Detail:          The patient was taken to the operating room once the blood products were available. The abdomen was prepped and draped in sterile fashion after the and induction of general endotracheal anesthesia. Surgical time out was performed. Intravenous antibiotics were given immediately preop. We began infusing blood and plasma.  Midline laparotomy incision was made. Dissection was carried down through subcutaneous tissue. We went through the fascia and found a lot of fat necrosis and  some foreign material which looked like biologic mesh. This had to be extensively debrided. We slowly entered the abdomen. We spent about 45 minutes taking down the adhesions to the undersurface of the abdominal wall. Once we had all this free we found once partial-thickness serosal defect in the proximal jejunum and repaired this with interrupted sutures of 2-0 silk. Continued into the pelvis we encountered yellowish-green small bowel content and a little bit of particulate matter. We evacuated all of this. We identified the old small bowel anastomosis which looked fine. We identified a single full-thickness perforation of the small bowel which we feel is the cause of her peritonitis. The tissues, although edematous, were   soft. There did not appear to be any cancer. Because of the patient's unstable condition we chose to simply close this with interrupted sutures of 2-0 silk. We then took down the rest of the adhesions and examined the small bowel from the ligament of Treitz all the way to the ileocecal valve and found no other pathology. We identified the right colon, transverse colon, descending colon, sigmoid colon and proximal rectum and found no other perforation. The stomach was massively distended but after positioning the NG tube that became evacuated. There was no evidence of gastric or duodenal perforation.  I wound up irrigating about 8 or 10 L of saline in all quadrants of the abdomen and pelvis to clean things up. We ran the bowel one more time. At this point in time the bleeding had slowed down. Because of the edema of the small bowel we chose not to close the fascia at this time. I placed a negative pressure dressing in the typical fashion and connected it to the suction machine and we had a very good seal. The patient was then taken to the intensive care unit in somewhat hemodynamic stable but critical condition. EBL 1000 cc or more. Replaced 6 units FFP, 2 units packed RBCs. Platelet infusion is  pending. Counts correct. Complications none. Patient required pressor support throughout the operation.      Angelia Mould. Derrell Lolling, M.D., FACS General and Minimally Invasive Surgery Breast and Colorectal Surgery  11/12/2012 5:06 PM

## 2012-11-12 NOTE — Transfer of Care (Signed)
Immediate Anesthesia Transfer of Care Note  Patient: [name] [name]  Procedure(s) Performed: Procedure(s): EXPLORATORY LAPAROTOMY  with repair perforated small bowel, abdominal wall debridement, application of wound vac dressing (N/A)  Patient Location: PACU  Anesthesia Type:General  Level of Consciousness: sedated  Airway & Oxygen Therapy: Patient remains intubated per anesthesia plan  Post-op Assessment: Report given to PACU RN and Post -op Vital signs reviewed and stable  Post vital signs: stable  Complications: No apparent anesthesia complications

## 2012-11-12 NOTE — Procedures (Signed)
Central Venous Catheter Insertion Procedure Note [name] [name] [id number] 04-14-1946  Procedure: Insertion of Central Venous Catheter Indications: Assessment of intravascular volume and Drug and/or fluid administration  Procedure Details Consent: Unable to obtain consent because of emergent medical necessity. Time Out: Verified patient identification, verified procedure, site/side was marked, verified correct patient position, special equipment/implants available, medications/allergies/relevent history reviewed, required imaging and test results available.  Performed  Maximum sterile technique was used including antiseptics, cap, gloves, gown, hand hygiene, mask and sheet. Skin prep: Chlorhexidine; local anesthetic administered A antimicrobial bonded/coated triple lumen catheter was placed in the left internal jugular vein using the Seldinger technique.  The left subclavian vein was accessed but I could not pass the wire.   Ultrasound was used to verify the patency of the vein and for real time needle guidance.  Evaluation Blood flow good Complications: No apparent complications Patient did tolerate procedure well. Chest X-ray ordered to verify placement.  CXR: pending.  [name] [name] 11/12/2012, 7:54 PM

## 2012-11-12 NOTE — Anesthesia Preprocedure Evaluation (Addendum)
Anesthesia Evaluation  Patient identified by MRN, date of birth, ID band Patient awake    Reviewed: Allergy & Precautions, H&P , NPO status , Patient's Chart, lab work & pertinent test results  Airway Mallampati: I TM Distance: >3 FB Neck ROM: full    Dental  (+) Dental Advisory Given   Pulmonary neg pulmonary ROS, shortness of breath and with exertion, pneumonia -, resolved,  breath sounds clear to auscultation        Cardiovascular hypertension, Pt. on medications and Pt. on home beta blockers + Peripheral Vascular Disease negative cardio ROS  Rhythm:regular Rate:Normal     Neuro/Psych PSYCHIATRIC DISORDERS Anxiety CVA, Residual Symptoms negative neurological ROS     GI/Hepatic negative GI ROS, (+)     substance abuse  alcohol use, Hepatitis -  Endo/Other  negative endocrine ROS  Renal/GU Renal diseasenegative Renal ROS     Musculoskeletal negative musculoskeletal ROS (+)   Abdominal (+) + obese,   Peds  Hematology negative hematology ROS (+)   Anesthesia Other Findings Bowel obstruction  Reproductive/Obstetrics negative OB ROS                          Anesthesia Physical  Anesthesia Plan  ASA: IV and emergent  Anesthesia Plan: General   Post-op Pain Management:    Induction: Intravenous, Rapid sequence and Cricoid pressure planned  Airway Management Planned: Oral ETT  Additional Equipment: Arterial line  Intra-op Plan:   Post-operative Plan: Possible Post-op intubation/ventilation and Extubation in OR  Informed Consent: I have reviewed the patients History and Physical, chart, labs and discussed the procedure including the risks, benefits and alternatives for the proposed anesthesia with the patient or authorized representative who has indicated his/her understanding and acceptance.   Dental advisory given  Plan Discussed with: CRNA  Anesthesia Plan Comments:        Anesthesia Quick Evaluation

## 2012-11-12 NOTE — Progress Notes (Signed)
General surgery attending note:   I have interviewed this patient and her husband. I have discussed her past medical history with Dr. Yancey Flemings fom [facility]. I reviewed her old records. I agree with the assessment and treatment plan outlined Mr. [name] [name], [place].  Basically this is a high-risk patient with morbid obesity, mild coagulopathy, alcohol abuse, liver disease and abnormal liver function tests.  Years ago she underwent umbilical hernia repair by Dr. Leonie Man.   In 2008 she underwent repair of strangulated umbilical hernia, small bowel resection By Dr. Dwain Sarna. In 2013 she underwent laparotomy and lysis of adhesions through the midline incision, Also by Dr. Dwain Sarna according to the patient's husband.. She was hospitalized earlier this year with a GI bleed. Capsule endoscopy and enteroscopy  suggested a metallic foreign body embedded in the wall of the digit mid jejunum as a possible source although active bleeding was not seen. She developed nausea and vomiting and weakness recently and was brought to the measure and yesterday. She was noted to be tachycardic and dehydrated. Her abdominal pain has become more severe.Decubitus abdominal x-rays show free intraperitoneal air consistent with bowel perforation.  Her abdominal exam shows multiple scars, possibly a small hernia in the upper right epigastrium but nothing incarcerated. She has diffuse guarding, tenderness, and rebound. Nonlocalizing. No inguinal hernias noted.    Assessment: Perforated viscus. Exact site unknown. Possible duodenal ulcer. Possible small bowel perforation. Possible colonic perforation. This will require immediate surgical intervention. Elevated liver function tests, most consistent with alcoholic hepatitis. This is not a new finding. Clinically she has cirrhosis, but no prior history of varices or portal hypertension Current alcohol abuse. Currently on when necessary Ativan Acute renal failure,  multifactorial. Recent acute up a GI bleed with a capsule endoscopy and interosseus suggesting jejunal bleed with a possible surgical staple site.  Plan: PT, PTT and type and screen had been drawn The patient be taken the operating room  for laparotomy, repair for intestinal perforation I discussed the indications, details, techniques and numerous, numerous risks of this surgery with the patient and her husband. They are aware that she is not in good physical condition due to her obesity, liver disease, and ongoing alcohol abuse. High risk. No viable alternatives are apparent. They agree with this plan. All their questions were answered. They understand all these issues.   [name] [name]. [name] [name], M.D., [facility] General and Minimally invasive Surgery Breast and Colorectal Surgery Office:   [phone] Pager:   [phone]

## 2012-11-12 NOTE — Progress Notes (Signed)
Received call from Dr. Conception Oms with radiology.  Patients x-ray confirms free air and possible bowel perforation.  Dr. Darnelle Catalan notified and order received to transfer to step-down unit and she will call surgeon.  Allayne Butcher [facility]  11/12/2012  1:02 PM

## 2012-11-12 NOTE — Consult Note (Addendum)
General surgery attending note:  I have interviewed this patient and her husband. I have discussed her past medical history with Dr. Yancey Flemings fom  GI. I reviewed her old records. I agree with the assessment and treatment plan outlined Mr. Marlyne Beards, [place].  Basically this is a high-risk patient with morbid obesity, mild coagulopathy, alcohol abuse, liver disease and abnormal liver function tests. Years ago she underwent umbilical hernia repair by Dr. Leonie Man. In 2008 she underwent repair of strangulated umbilical hernia, small bowel resection by Dr. Dwain Sarna. In 2013 she underwent laparotomy and lysis of adhesions through the midline incision, Also by Dr. Dwain Sarna according to the patient's husband.. She was hospitalized earlier this year with a GI bleed. Capsule endoscopy and enteroscopy suggested a metallic foreign body embedded in the wall of the  mid jejunum as a possible source although active bleeding was not seen. She developed nausea and vomiting and weakness recently and was brought to the ED yesterday. She was noted to be tachycardic and dehydrated. Her abdominal pain has become more severe.Decubitus abdominal x-rays today show free intraperitoneal air consistent with bowel perforation.  Her abdominal exam shows multiple scars, possibly a small hernia in the upper right epigastrium but nothing incarcerated. She has diffuse guarding, tenderness, and rebound. Nonlocalizing. No inguinal hernias noted.   Assessment: Perforated viscus. Exact site unknown. Possible duodenal ulcer. Possible small bowel perforation. Possible colonic perforation. This will require immediate surgical intervention.  Elevated liver function tests, most consistent with alcoholic hepatitis. This is not a new finding.  Clinically she has cirrhosis, but no prior history of varices or portal hypertension  Current alcohol abuse. Currently on when necessary Ativan  Acute renal failure, multifactorial.  Recent acute up  a GI bleed with a capsule endoscopy and interosseus suggesting jejunal bleed with a possible surgical staple site.   Plan: PT, PTT and type and screen had been drawn  The patient be taken the operating room for laparotomy, repair for intestinal perforation  I discussed the indications, details, techniques and numerous, numerous risks of this surgery with the patient and her husband. They are aware that she is not in good physical condition due to her obesity, liver disease, and ongoing alcohol abuse. High risk. No viable alternatives are apparent. They agree with this plan. All their questions were answered. They understand all these issues.    Angelia Mould. Derrell Lolling, M.D., [facility]  General and Minimally invasive Surgery  Breast and Colorectal Surgery  Office: [phone]  Pager: [phone]

## 2012-11-12 NOTE — Progress Notes (Signed)
Pt's glucose on labs this am was 59.  CBG checked at 0750 and it was 57.  Cup of OJ given and CBG increased to 59.  Glucose gel given at 0820.  CBG increased to 63 at 0845.  Gave 1/2 amp of D50 and CBG increased to 120.  Dr. Darnelle Catalan notified and IV fluids changed to D5NS.  Allayne Butcher [facility]  11/12/2012

## 2012-11-12 NOTE — Progress Notes (Signed)
Patient transferred to room 1224 in the ICU.  Report given to Volanda Napoleon, RN.  Patient's husband at the bedside and aware of transfer.  Allayne Butcher [facility]  11/12/2012

## 2012-11-12 NOTE — Progress Notes (Addendum)
[facility] PROGRESS NOTE  [name] [name] ZOX:[id number] DOB: 11-Feb-1946 DOA: 11/11/2012 PCP: Judie Petit, MD  Brief narrative: [name] [name] is an 67 y.o. female with a PMH of chronic abdominal pain with recurrent nausea and vomiting as well as alcohol abuse. The patient has been admitted multiple times for nausea and vomiting and recurrent bleeds. She had a scheduled appointment at [facility] 11/11/2012, but missed her appointment do to being seen in the emergency department for intractable nausea and vomiting. In the emergency room she was noted to have a normal white count and normal lipase level, but minimal elevation creatinine secondary to dehydration as well as elevation in AST, alkaline phosphatase and a bilirubin of 4.6, one week ago was down to 1.5. She was also found to be tachycardic with a heart rate in the 130s, unresponsive to IV fluid challenge.  On exam 11/12/12, the patient appeared to be dyspneic and a CXR was ordered to rule out aspiration PNA.  Addendum: CXR showed possible free air which was confirmed by decubitus films.  Emergent surgical consultation requested, and the patient was transferred to the ICU.  Assessment/Plan: Principal Problem:   Alcohol withdrawal / alcohol abuse -Triggered by cessation of alcohol for 2 days prior to admission. -Admits to drinking 3-4 glasses/wine per day, but husband thinks she drinks more than this. -Placed on a withdrawal protocol. -Counseled on alcohol cessation. -Continue B12/folic acid supplementation. Active Problems:   Dyspnea, cough, wheezing rule out aspiration pneumonia -With intractable nausea and vomiting, and new onset dyspnea, wheezing and cough, suspect she has underlying aspiration pneumonia/pneumonitis. -On empiric Levaquin for UTI which will treat aspiration. -Check portable chest x-ray.   Normocytic anemia -Likely anemia of chronic disease. Monitor. History of significant GI bleeding in the past.   UTI /  leukocytosis -Started on empiric Levaquin. -Followup urine culture results.   Hypoglycemia -Persistently low blood glucoses despite intermittent doses of IV glucose and liquid gel. -Change IV fluids to D5 normal saline at 100 cc per hour.   HYPERLIPIDEMIA -Statin on hold secondary to elevated liver function studies.   HYPERTENSION -Blood pressure borderline low. All antihypertensives on hold.   Hyponatremia -Mild, improving with hydration.   Nausea & vomiting secondary to ileus -Supportive care with antinausea medications and IV fluids. -No further vomiting overnight.   Abdominal pain, generalized -Likely from alcohol-induced hepatitis. Low-dose morphine as needed.   Obesity -Currently on a clear liquid diet. Advance as tolerated to heart healthy.   ARF (acute renal failure) -Thought to be prerenal in etiology. -Need also to consider hepatorenal syndrome.   Coagulopathy -Secondary to alcohol related liver disease. -Use SCDs for DVT prophylaxis.   Anxiety -Continue when necessary Xanax.   Increased bilirubin level / transaminitis / alcohol hepatitis / fatty liver -Hold statin. -GI consultation requested.   Code Status: Full Family Communication: Husband at bedside. Disposition Plan: Home when stable.   Medical Consultants:  [facility].  Other Consultants:  None.  Anti-infectives:  Levaquin 11/12/2012--->  HPI/Subjective: [name] [name] is dyspneic with a moist cough. She is not tremulous and admits to drinking 3-4 glasses of wine daily although her husband indicates that she typically drinks more than she admits to. No further nausea or vomiting overnight. Feels weak.  Objective: Filed Vitals:   11/12/12 0130 11/12/12 0200 11/12/12 0540 11/12/12 0752  BP: 94/69 94/69 98/62  106/54  Pulse: 118 118 79 113  Temp: 98.8 F (37.1 C)  98.5 F (36.9 C) 98.2 F (36.8 C)  TempSrc:  Oral  Oral Oral  Resp: 20  20 22   Height:      Weight:      SpO2: 96%  97% 98%     Intake/Output Summary (Last 24 hours) at 11/12/12 1033 Last data filed at 11/12/12 0600  Gross per 24 hour  Intake   1100 ml  Output      0 ml  Net   1100 ml    Exam: Gen:  Weak Cardiovascular:  Tachycardic Respiratory:  Lungs with diffuse wheezes Gastrointestinal:  Abdomen soft, NT/ND, + BS Extremities:  1+ edema  Data Reviewed: Basic Metabolic Panel:  Recent Labs Lab 11/11/12 1530 11/12/12 0603  NA 131* 134*  K 4.3 4.2  CL 99 103  CO2 24 21  GLUCOSE 76 59*  BUN 9 13  CREATININE 1.26* 2.20*  CALCIUM 7.4* 7.1*   GFR Estimated Creatinine Clearance: 29.9 ml/min (by C-G formula based on Cr of 2.2). Liver Function Tests:  Recent Labs Lab 11/11/12 1530 11/12/12 0603  AST 81* 62*  ALT 30 24  ALKPHOS 151* 116  BILITOT 4.6* 4.9*  PROT 7.2 6.4  ALBUMIN 1.6* 1.4*    Recent Labs Lab 11/11/12 1530  LIPASE 11   CBC:  Recent Labs Lab 11/11/12 1530 11/12/12 0603  WBC 6.2 15.2*  NEUTROABS 5.2  --   HGB 11.3* 10.4*  HCT 34.3* 33.7*  MCV 96.1 99.4  PLT 168 158   CBG:  Recent Labs Lab 11/12/12 0750 11/12/12 0817 11/12/12 0845 11/12/12 0923  GLUCAP 57* 59* 63* 120*   Urinalysis    Component Value Date/Time   COLORURINE RED* 11/11/2012 2155   APPEARANCEUR CLOUDY* 11/11/2012 2155   LABSPEC 1.029 11/11/2012 2155   PHURINE 5.0 11/11/2012 2155   GLUCOSEU NEGATIVE 11/11/2012 2155   HGBUR NEGATIVE 11/11/2012 2155   HGBUR negative 05/23/2010 0922   BILIRUBINUR LARGE* 11/11/2012 2155   KETONESUR 15* 11/11/2012 2155   PROTEINUR NEGATIVE 11/11/2012 2155   UROBILINOGEN 1.0 11/11/2012 2155   NITRITE POSITIVE* 11/11/2012 2155   LEUKOCYTESUR SMALL* 11/11/2012 2155      Microbiology No results found for this or any previous visit (from the past 240 hour(s)).   Procedures and Diagnostic Studies: Abd 1 View (kub)  11/11/2012  *RADIOLOGY REPORT*  Clinical Data: Abdominal pain with nausea and vomiting.  Reduced bowel sounds.  ABDOMEN - 1 VIEW  Comparison: 10/05/2012   Findings: There is a ovoid shaped gas structure in the midline which likely represents stomach.  Inferior and lateral to this there are moderately distended bowel loops which could represent small bowel or colon.  A similar appearance was noted on the prior exam from 10/05/2012, although the bowel loops are more distended at that time. A linear radiopaque density which was previously located in the right upper quadrant  now projects over the left lower quadrant. A more subtle left lower quadrant radiopaque density located along the left panniculus remains unchanged.  IMPRESSION: Gas-filled mid abdominal loops are not as distended as previously noted, but mildly abnormal. Findings consistent with ileus although early obstruction not excluded.  Previously noted right upper quadrant radiopaque density has transited to the left side of the abdomen.   Original Report Authenticated By: Davonna Belling, M.D.     Scheduled Meds: . chlordiazePOXIDE  10 mg Oral TID  . [START ON 11/13/2012] levofloxacin (LEVAQUIN) IV  250 mg Intravenous Q24H  . levofloxacin (LEVAQUIN) IV  500 mg Intravenous Once   Continuous Infusions: . dextrose 5 % and 0.9% NaCl  100 mL/hr at 11/12/12 1610    Time spent: 40 minutes   LOS: 1 day   Souleymane Saiki  [facility] Pager [phone].  If 8PM-8AM, please contact night-coverage at [email or web address], password [facility] 11/12/2012, 10:33 AM

## 2012-11-12 NOTE — Consult Note (Signed)
PULMONARY  / CRITICAL CARE MEDICINE  Name: [name] [name] MRN: [id number] DOB: 1945-12-14    ADMISSION DATE:  11/11/2012 CONSULTATION DATE:  11/12/2012  REFERRING MD :  CCS PRIMARY SERVICE:  PCCM  CHIEF COMPLAINT:  Post op respiratory failure  BRIEF PATIENT DESCRIPTION: 67 yo with alcoholic liver disease was admitted on 5/7 with abdominal pain and was found on 5/8 to have a small bowel perforation.  She was taken to the OR and returned to the ICU with an open wound and mechanically ventilated.   SIGNIFICANT EVENTS / STUDIES:  5/7  Admitted with abdominal pain, nausea, vomiting 5/7  OR >>> Perforated viscus, peritonitis, lysis of adhesions, small bowel repair  LINES / TUBES: OETT 5/8 >>> OGT 5/8 >>> Foley 5/8 >>> R rad A-line 5/8 >>> L IJ CVL 5/8 >>  CULTURES: 5/8 Blood >>> 5/7 Urine >>>  ANTIBIOTICS: Aztreonam 5/8 >>> Flagyl 5/8 >>> Levaquin 5/8 x 1 Cipro 5/8 x 1  The patient is encephalopathic and unable to provide history, which was obtained for available medical records.  HISTORY OF PRESENT ILLNESS:  67 yo with past medical history of morbid obesity, alcoholic liver disease and coagulopathy brought to [facility] ED on 5/7 with complaints of abdominal pain, nausea and womiting. On 5/8 abdominal x-ray demonstrated free intraperitoneal air.  She was taken to OR and underwent exploratory laparotomy and repair of small bowel perforation. Signs of peritonitis were noted. Multiple blood products were transfused.  She came back to ICU intubated, with open abdomen. PCCM was consulted.  PAST MEDICAL HISTORY :  Past Medical History  Diagnosis Date  . Hypertension   . H/O brain surgery   . Aneurysm of anterior cerebral artery   . Hernia   . Anxiety   . Stroke   . Anemia   . Arthritis    Past Surgical History  Procedure Laterality Date  . Laparotomy  01/22/2012    Procedure: EXPLORATORY LAPAROTOMY;  Surgeon: Emelia Loron, MD;  Location: [facility] OR;  Service: General;  Laterality:  N/A;  lysis of adhesions  . Hernia repair    . Esophagogastroduodenoscopy N/A 09/19/2012    Procedure: ESOPHAGOGASTRODUODENOSCOPY (EGD);  Surgeon: Charna Shivali, MD;  Location: [facility] ENDOSCOPY;  Service: Endoscopy;  Laterality: N/A;  . Colonoscopy N/A 09/20/2012    Procedure: COLONOSCOPY;  Surgeon: Charna Jaxyn, MD;  Location: [facility] ENDOSCOPY;  Service: Endoscopy;  Laterality: N/A;  . Givens capsule study N/A 09/21/2012    Procedure: GIVENS CAPSULE STUDY;  Surgeon: Charna Chavon, MD;  Location: [facility] ENDOSCOPY;  Service: Endoscopy;  Laterality: N/A;  . Enteroscopy N/A 09/28/2012    Procedure: ENTEROSCOPY;  Surgeon: Rachael Fee, MD;  Location: [facility] ENDOSCOPY;  Service: Endoscopy;  Laterality: N/A;  . Upper gastrointestinal endoscopy  10/05/12  . Enteroscopy N/A 10/05/2012    Procedure: ENTEROSCOPY;  Surgeon: Meryl Dare, MD;  Location: [facility] ENDOSCOPY;  Service: Endoscopy;  Laterality: N/A;   Prior to Admission medications   Medication Sig Start Date End Date Taking? Authorizing Provider  ALPRAZolam (XANAX) 0.25 MG tablet Take 0.25 mg by mouth daily as needed. For anxiety   Yes Historical Provider, MD  benazepril (LOTENSIN) 20 MG tablet TAKE 1 TABLET ONCE DAILY. 10/05/12  Yes Bruce Romilda Garret, MD  labetalol (NORMODYNE) 100 MG tablet TAKE 1 TABLET TWICE DAILY. 10/05/12  Yes Bruce Romilda Garret, MD  ondansetron (ZOFRAN) 4 MG tablet Take 4 mg by mouth every 8 (eight) hours as needed for nausea. 11/04/12  Yes Hanley Seamen, MD  pantoprazole (PROTONIX) 40 MG tablet Take 1 tablet (40 mg total) by mouth 2 (two) times daily. 09/22/12  Yes Shanker Levora Dredge, MD  simvastatin (ZOCOR) 40 MG tablet Take 40 mg by mouth every evening.   Yes Historical Provider, MD   Allergies  Allergen Reactions  . Cephalexin Rash  . Penicillins Rash    FAMILY HISTORY:  Family History  Problem Relation Age of Onset  . Cancer Father     pancreatic  . Cancer Maternal Grandmother     breast   SOCIAL HISTORY:  reports that she has never  smoked. She has never used smokeless tobacco. She reports that she drinks about 12.0 ounces of alcohol per week. She reports that she does not use illicit drugs.  REVIEW OF SYSTEMS:  Unable to provide.  INTERVAL HISTORY:  VITAL SIGNS: Temp:  [98.2 F (36.8 C)-99.2 F (37.3 C)] 98.2 F (36.8 C) (05/08 0752) Pulse Rate:  [79-120] 114 (05/08 1449) Resp:  [18-22] 18 (05/08 1449) BP: (85-122)/(50-69) 103/55 mmHg (05/08 1445) SpO2:  [91 %-99 %] 99 % (05/08 1449) Weight:  [97.977 kg (216 lb)-101.1 kg (222 lb 14.2 oz)] 101.1 kg (222 lb 14.2 oz) (05/08 1330)  HEMODYNAMICS:   VENTILATOR SETTINGS:   INTAKE / OUTPUT: Intake/Output     05/07 0701 - 05/08 0700 05/08 0701 - 05/09 0700   I.V. (mL/kg) 1100 (11.2) 1000 (9.9)   Blood  2543   Total Intake(mL/kg) 1100 (11.2) 3543 (35)   Other  700   Blood  1200   Total Output 0 1900   Net +1100 +1643          PHYSICAL EXAMINATION: General:  Sedated on vent HEENT: NCAT, sleral icterus noted, PERRL, ETT, OG tubes in place PULM: Rhonchi on R > L CV: RRR, no mgr AB: central wound vac in place, no bowel sounds Ext: edema in feet, SCD's in place Neuro: arouses to voice, does not follow commands, moves all four ext   LABS:  Recent Labs Lab 11/11/12 1530 11/12/12 0603 11/12/12 1334  HGB 11.3* 10.4*  --   WBC 6.2 15.2*  --   PLT 168 158  --   NA 131* 134*  --   K 4.3 4.2  --   CL 99 103  --   CO2 24 21  --   GLUCOSE 76 59*  --   BUN 9 13  --   CREATININE 1.26* 2.20*  --   CALCIUM 7.4* 7.1*  --   AST 81* 62*  --   ALT 30 24  --   ALKPHOS 151* 116  --   BILITOT 4.6* 4.9*  --   PROT 7.2 6.4  --   ALBUMIN 1.6* 1.4*  --   APTT  --   --  49*  INR  --   --  3.23*    Recent Labs Lab 11/12/12 0750 11/12/12 0817 11/12/12 0845 11/12/12 0923 11/12/12 1300  GLUCAP 57* 59* 63* 120* 128*   CXR:  5/8 >>> pending  ASSESSMENT / PLAN:  PULMONARY A:  Acute post operative respiratory failure.  At risk for TRALI (multiple  transfusions). P:   Gaol SpO2>92, pH>7.30 Full mechanical support Daily SBT, do not extubate as open abdomen / back to OR on Saturday Trend ABG / CXR  CARDIOVASCULAR A: Sepsis / septic shock secondary to peritonitis. P:  Goal MAP>60 Trend lactate Change Neo-Synephrine to levophed gtt Monitor CVP Needs CVL  RENAL A:  AKI.  Hypovolemia. P:  Goal CVP>10 Trend BMP NS+D5@100  NS 1000 x 1  GASTROINTESTINAL A:  Alcoholic cirrhosis.  Acute alcoholic hepatitis.  Perforated viscus in jejunem s/p repair 5/8 P:   CCS following Likely back to OR on Saturday NPO as intubated Protonix for GI Px  HEMATOLOGIC A:  Coagulopathy. Significant intraoperative bleeding controlled with platelets and plasma. P:  Trend CBC/APTT / INR SCD for DVT Px  INFECTIOUS A:  Peritonitis.  PCN / Cephalosporin allergy.  P:   Cultures and antibiotics as above PCT  ENDOCRINE  A:  Hypoglycemia. P:   IVF as above  NEUROLOGIC A:  Acute encephalopathy.  Alcohol abuse. Post op pain. At risk for alcohol withdrawal. P:   Goal RASS  -1 to -2 Thiamin / Folate Fentanyl / Versed   TODAY'S SUMMARY: 67 y/o female with alcoholic cirrhosis admitted 5/7 and taken to the OR on 5/8 for a perforated jejunem.  Intraoperatively she had significant bleeding controlled with plasma/plt transfusions.  Will remain intubated until she goes back to the OR for closure.  I have personally obtained a history, examined the patient, evaluated laboratory and imaging results, formulated the assessment and plan and placed orders.  CRITICAL CARE:  The patient is critically ill with multiple organ systems failure and requires high complexity decision making for assessment and support, frequent evaluation and titration of therapies, application of advanced monitoring technologies and extensive interpretation of multiple databases. Critical Care Time devoted to patient care services described in this note is 45 minutes.   Max Fickle, MD Pulmonary and Critical Care Medicine [facility] Pager: [phone]  11/12/2012, 5:17 PM

## 2012-11-12 NOTE — Progress Notes (Signed)
ANTIBIOTIC CONSULT NOTE - INITIAL  Pharmacy Consult for Aztreonam Indication: perforated viscus  Allergies  Allergen Reactions  . Cephalexin Rash  . Penicillins Rash   Patient Measurements: Height: 5\' 5"  (165.1 cm) Weight: 222 lb 14.2 oz (101.1 kg) IBW/kg (Calculated) : 57 Adjusted Body Weight: 60 kg  Vital Signs: Temp: 97.6 F (36.4 C) (05/08 1730) Temp src: Oral (05/08 1730) BP: 107/58 mmHg (05/08 1708) Pulse Rate: 98 (05/08 1730) Intake/Output from previous day: 05/07 0701 - 05/08 0700 In: 1100 [I.V.:1100] Out: 0  Intake/Output from this shift: Total I/O In: 5652 [I.V.:3009; Blood:2593; IV Piggyback:50] Out: 1910 [Urine:10; Other:700; Blood:1200]  Labs:  Recent Labs  11/11/12 1530 11/12/12 0603  WBC 6.2 15.2*  HGB 11.3* 10.4*  PLT 168 158  CREATININE 1.26* 2.20*   Estimated Creatinine Clearance: 30.4 ml/min (by C-G formula based on Cr of 2.2). No results found for this basename: VANCOTROUGH, VANCOPEAK, VANCORANDOM, GENTTROUGH, GENTPEAK, GENTRANDOM, TOBRATROUGH, TOBRAPEAK, TOBRARND, AMIKACINPEAK, AMIKACINTROU, AMIKACIN,  in the last 72 hours   Microbiology: No results found for this or any previous visit (from the past 720 hour(s)).  Medical History: Past Medical History  Diagnosis Date  . Hypertension   . H/O brain surgery   . Aneurysm of anterior cerebral artery   . Hernia   . Anxiety   . Stroke   . Anemia   . Arthritis    Medications:  Anti-infectives   Start     Dose/Rate Route Frequency Ordered Stop   11/13/12 1500  ertapenem (INVANZ) 1 g in sodium chloride 0.9 % 50 mL IVPB     1 g 100 mL/hr over 30 Minutes Intravenous Every 24 hours 11/12/12 1758     11/13/12 1000  Levofloxacin (LEVAQUIN) IVPB 250 mg  Status:  Discontinued     250 mg 50 mL/hr over 60 Minutes Intravenous Every 24 hours 11/12/12 0953 11/12/12 1259   11/12/12 2200  ciprofloxacin (CIPRO) IVPB 400 mg  Status:  Discontinued     400 mg 200 mL/hr over 60 Minutes Intravenous Every  12 hours 11/12/12 1308 11/12/12 1747   11/12/12 1830  aztreonam (AZACTAM) 1 g in dextrose 5 % 50 mL IVPB     1 g 100 mL/hr over 30 Minutes Intravenous Every 8 hours 11/12/12 1811     11/12/12 1446  ertapenem (INVANZ) 1 g in sodium chloride 0.9 % 50 mL IVPB  Status:  Discontinued    Comments:  stat   1 g 100 mL/hr over 30 Minutes Intravenous 60 min pre-op 11/12/12 1444 11/12/12 1700   11/12/12 1400  metroNIDAZOLE (FLAGYL) IVPB 500 mg     500 mg 100 mL/hr over 60 Minutes Intravenous Every 8 hours 11/12/12 1308     11/12/12 1000  levofloxacin (LEVAQUIN) IVPB 500 mg     500 mg 100 mL/hr over 60 Minutes Intravenous  Once 11/12/12 0942 11/12/12 1157     Assessment: 67 yo obese Female with perforated viscus, peritonitis underwent expl lap with lysis of adhesions, repair of perforation.  Prior hx of cirrhosis from ETOH abuse, coagulopathy, admit INR 3.23. Vit K ordered.  Aztreonam per pharmacy, SCr elevated, Cl ~ 30 ml/min  PCN allergy noted as rash  Goal of Therapy:  Appropriate abx for infection, renal fx.  Plan:  Aztreonam 1gm q8 Monitor renal fx Urine cx ordered.  Otho Bellows PharmD Pager [phone] 11/12/2012, 6:31 PM

## 2012-11-12 NOTE — ED Provider Notes (Signed)
Medical screening examination/treatment/procedure(s) were performed by non-physician practitioner and as supervising physician I was immediately available for consultation/collaboration.  Najla Aughenbaugh R. Ardelia Wrede, MD 11/12/12 0655 

## 2012-11-12 NOTE — Consult Note (Signed)
Reason for Consult:Penumoperitoneium Referring Physician: Dr. Wynona Canes Rama Pysc: Dr Tomasa Rand GI: DR. Christella Hartigan and Dr. Loreta Ave PCP: Judie Petit, MD   [name] [name] is an 67 y.o. female.  HPI: With past medical history of chronic abdominal pain with recurrent nausea and vomiting as well as alcohol abuse. The patient has been admitted multiple times for nausea and vomiting and recurrent bleeds.  Last hospitalization, 09/25/12 with GI bleed. Small bowel enteroscopy at that time showed  a metallic staple like object was found in the mid to distal jejunal wall near what appeared to be a small bowel anastomosis. There was an ulcer adjacent to this metal object but no active bleeding. Post -procedure patient was admitted for blood transfusion and observation. Surgery was asked to evaluate patient regarding the foreign object found in the jejunum during enteroscopy. She was seen by Dr.Hoxworth who felt the foreign body might be the site of intermittent bleeding but there were concerns about underlying chronic liver disease and surgical risks. Plan was to refer patient to [facility] (Dr. Theodore Demark) for complete enteroscopy prior to pursuing surgery. By 10/07/12 patient was stable for discharge. It sounds like she did well until about 48 hours ago and started having more pain along with nausea and vomiting. Labs at that time showed some dehydration, low albumin, elevated bilirubin, low albumin, normal WBC and hemoglobin.  KUB showed gas filled abdominal loops, not as distended as prior films, possible ileus. Today WBC is up, creatinine is up and single abdominal film shows free intraperitoneal air consistent with a perforation.  Pt transferred to the ICU and we were called to see. She is confused and on ETOH withdrawal protocol.    Past Medical History  Diagnosis Date  . Hypertension   . H/O brain surgery   . 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 Hernia repair in 2006 Dr. Lurene Shadow    Recurrent GI  bleed with uncertain source   . Stroke   . 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 Anxiety     . Arthritis     Past Surgical History  Procedure Laterality Date  . Laparotomy Exploratory laparotomy, small bowel resection with primary  anastomosis and primary repair of umbilical hernia. 06/03/200Patrick Ballen, M.D.  1 exploratory laparotomy with lysis of adhesions x1 hour  #2primary ventral hernia repair with suture  Surgeon: Dr. Harden Mo, 01/22/2012.      01/22/2012    Procedure: EXPLORATORY LAPAROTOMY;  Surgeon: Emelia Loron, MD;  Location: [facility] OR;  Service: General;  Laterality: N/A;  lysis of adhesions  . Hernia repair    . Esophagogastroduodenoscopy N/A 09/19/2012    Procedure: ESOPHAGOGASTRODUODENOSCOPY (EGD);  Surgeon: Charna Pailynn, MD;  Location: [facility] ENDOSCOPY;  Service: Endoscopy;  Laterality: N/A;  . Colonoscopy N/A 09/20/2012    Procedure: COLONOSCOPY;  Surgeon: Charna Nalina, MD;  Location: [facility] ENDOSCOPY;  Service: Endoscopy;  Laterality: N/A;  . Givens capsule study N/A 09/21/2012    Procedure: GIVENS CAPSULE STUDY;  Surgeon: Charna Afsa, MD;  Location: [facility] ENDOSCOPY;  Service: Endoscopy;  Laterality: N/A;  . Enteroscopy N/A 09/28/2012    Procedure: ENTEROSCOPY;  Surgeon: Rachael Fee, MD;  Location: [facility] ENDOSCOPY;  Service: Endoscopy;  Laterality: N/A;  . Upper gastrointestinal endoscopy  10/05/12  . Enteroscopy N/A 10/05/2012    Procedure: ENTEROSCOPY;  Surgeon: Meryl Dare, MD;  Location: [facility] ENDOSCOPY;  Service: Endoscopy;  Laterality: N/A;    Family History  Problem Relation Age of Onset  . Cancer Father     pancreatic  . Cancer Maternal Grandmother     breast    Social History:  reports that she has never smoked. She has never used smokeless tobacco. She reports that she drinks about 12.0 ounces of alcohol per week. She reports that she does not use illicit drugs. Tobacco: none  Drugs: none  ETOH: 1 bottle of  wine per day for about 1 year according to her husband. Allergies:  Allergies  Allergen Reactions  . Cephalexin Rash  . Penicillins Rash    Medications:  Prior to Admission:  Prescriptions prior to admission  Medication Sig Dispense Refill  . ALPRAZolam (XANAX) 0.25 MG tablet Take 0.25 mg by mouth daily as needed. For anxiety      . benazepril (LOTENSIN) 20 MG tablet TAKE 1 TABLET ONCE DAILY.  30 tablet  5  . labetalol (NORMODYNE) 100 MG tablet TAKE 1 TABLET TWICE DAILY.  60 tablet  5  . ondansetron (ZOFRAN) 4 MG tablet Take 4 mg by mouth every 8 (eight) hours as needed for nausea.      . pantoprazole (PROTONIX) 40 MG tablet Take 1 tablet (40 mg total) by mouth 2 (two) times daily.  60 tablet  0  . simvastatin (ZOCOR) 40 MG tablet Take 40 mg by mouth every evening.       Scheduled: . [facility] HOLD] chlordiazePOXIDE  10 mg Oral TID  . [facility] HOLD] ciprofloxacin  400 mg Intravenous Q12H  . [facility] HOLD] metronidazole  500 mg Intravenous Q8H   Continuous: . dextrose 5 % and 0.9% NaCl 100 mL/hr at 11/12/12 0907   PRN:[MAR HOLD] dextrose, [MAR HOLD] LORazepam, [MAR HOLD] LORazepam, [MAR HOLD]  morphine injection, [MAR HOLD] ondansetron (ZOFRAN) IV, [MAR HOLD] ondansetron Anti-infectives   Start     Dose/Rate Route Frequency Ordered Stop   11/13/12 1000  Levofloxacin (LEVAQUIN) IVPB 250 mg  Status:  Discontinued     250 mg 50 mL/hr over 60 Minutes Intravenous Every 24 hours 11/12/12 0953 11/12/12 1259   11/12/12 2200  [MAR Hold]  ciprofloxacin (CIPRO) IVPB 400 mg     (On MAR Hold since 11/12/12 1404)   400 mg 200 mL/hr over 60 Minutes Intravenous Every 12 hours 11/12/12 1308     11/12/12 1400  [MAR Hold]  metroNIDAZOLE (FLAGYL) IVPB 500 mg     (On MAR Hold since 11/12/12 1404)   500 mg 100 mL/hr over 60 Minutes Intravenous Every 8 hours 11/12/12 1308     11/12/12 1000  levofloxacin (LEVAQUIN) IVPB 500 mg     500 mg 100 mL/hr over 60 Minutes Intravenous  Once 11/12/12 0942 11/12/12 1157       Results for orders placed during the hospital encounter of 11/11/12 (from the past 48 hour(s))  COMPREHENSIVE METABOLIC PANEL     Status: Abnormal   Collection Time    11/11/12  3:30 PM      Result Value Range   Sodium 131 (*) 135 - 145 mEq/L   Potassium 4.3  3.5 - 5.1 mEq/L   Chloride 99  96 - 112 mEq/L   CO2 24  19 - 32 mEq/L   Glucose, Bld 76  70 - 99 mg/dL   BUN 9  6 - 23 mg/dL   Creatinine, Ser 1.91 (*) 0.50 - 1.10 mg/dL   Calcium 7.4 (*) 8.4 - 10.5 mg/dL   Total Protein 7.2  6.0 - 8.3 g/dL   Albumin 1.6 (*) 3.5 - 5.2 g/dL   AST 81 (*) 0 - 37 U/L   ALT 30  0 - 35 U/L   Alkaline Phosphatase 151 (*) 39 - 117 U/L   Total Bilirubin 4.6 (*) 0.3 - 1.2 mg/dL   GFR calc non Af Amer 44 (*) >90 mL/min   GFR calc Af Amer 51 (*) >90 mL/min   Comment:            The eGFR has been calculated     using the CKD EPI equation.     This calculation has not been     validated in all clinical     situations.     eGFR's persistently     <90 mL/min signify     possible Chronic Kidney Disease.  CBC WITH DIFFERENTIAL     Status: Abnormal   Collection Time    11/11/12  3:30 PM      Result Value Range   WBC 6.2  4.0 - 10.5 K/uL   RBC 3.57 (*) 3.87 - 5.11 MIL/uL   Hemoglobin 11.3 (*) 12.0 - 15.0 g/dL   HCT 30.8 (*) 65.7 - 84.6 %   MCV 96.1  78.0 - 100.0 fL   MCH 31.7  26.0 - 34.0 pg   MCHC 32.9  30.0 - 36.0 g/dL   RDW 96.2 (*) 95.2 - 84.1 %   Platelets 168  150 - 400 K/uL   Neutrophils Relative 85 (*) 43 - 77 %   Lymphocytes Relative 9 (*) 12 - 46 %   Monocytes Relative 6  3 - 12 %   Eosinophils Relative 0  0 - 5 %   Basophils Relative 0  0 - 1 %   Neutro Abs 5.2  1.7 - 7.7 K/uL   Lymphs Abs 0.6 (*) 0.7 - 4.0 K/uL   Monocytes Absolute 0.4  0.1 - 1.0 K/uL   Eosinophils Absolute 0.0  0.0 - 0.7 K/uL   Basophils Absolute 0.0  0.0 - 0.1 K/uL   WBC Morphology MILD LEFT SHIFT (1-5% METAS, OCC MYELO, OCC BANDS)     Comment: INCREASED BANDS (>20% BANDS)     TOXIC GRANULATION      VACUOLATED NEUTROPHILS  LIPASE, BLOOD     Status: None   Collection Time    11/11/12  3:30 PM      Result Value Range   Lipase 11  11 - 59 U/L  ACETAMINOPHEN LEVEL     Status: None   Collection Time    11/11/12  3:30 PM      Result Value Range   Acetaminophen (Tylenol), Serum <15.0  10 - 30 ug/mL   Comment:            THERAPEUTIC CONCENTRATIONS VARY     SIGNIFICANTLY. A RANGE OF 10-30     ug/mL MAY BE AN EFFECTIVE     CONCENTRATION FOR MANY PATIENTS.     HOWEVER, SOME ARE BEST TREATED     AT CONCENTRATIONS OUTSIDE THIS     RANGE.     ACETAMINOPHEN CONCENTRATIONS     >150 ug/mL AT 4 HOURS AFTER     INGESTION AND >50 ug/mL AT 12     HOURS AFTER INGESTION ARE     OFTEN ASSOCIATED WITH TOXIC     REACTIONS.  URINALYSIS, ROUTINE W REFLEX MICROSCOPIC     Status: Abnormal   Collection Time    11/11/12  9:55 PM  Result Value Range   Color, Urine RED (*) YELLOW   Comment: BIOCHEMICALS MAY BE AFFECTED BY COLOR   APPearance CLOUDY (*) CLEAR   Specific Gravity, Urine 1.029  1.005 - 1.030   pH 5.0  5.0 - 8.0   Glucose, UA NEGATIVE  NEGATIVE mg/dL   Hgb urine dipstick NEGATIVE  NEGATIVE   Bilirubin Urine LARGE (*) NEGATIVE   Ketones, ur 15 (*) NEGATIVE mg/dL   Protein, ur NEGATIVE  NEGATIVE mg/dL   Urobilinogen, UA 1.0  0.0 - 1.0 mg/dL   Nitrite POSITIVE (*) NEGATIVE   Leukocytes, UA SMALL (*) NEGATIVE  URINE MICROSCOPIC-ADD ON     Status: Abnormal   Collection Time    11/11/12  9:55 PM      Result Value Range   Squamous Epithelial / LPF RARE  RARE   WBC, UA 3-6  <3 WBC/hpf   RBC / HPF 0-2  <3 RBC/hpf   Bacteria, UA MANY (*) RARE   Casts HYALINE CASTS (*) NEGATIVE   Crystals BILIRUBIN CRYSTALS (*) NEGATIVE  COMPREHENSIVE METABOLIC PANEL     Status: Abnormal   Collection Time    11/12/12  6:03 AM      Result Value Range   Sodium 134 (*) 135 - 145 mEq/L   Potassium 4.2  3.5 - 5.1 mEq/L   Chloride 103  96 - 112 mEq/L   CO2 21  19 - 32 mEq/L   Glucose, Bld 59 (*) 70 - 99  mg/dL   BUN 13  6 - 23 mg/dL   Creatinine, Ser 1.61 (*) 0.50 - 1.10 mg/dL   Comment: REPEATED TO VERIFY   Calcium 7.1 (*) 8.4 - 10.5 mg/dL   Total Protein 6.4  6.0 - 8.3 g/dL   Albumin 1.4 (*) 3.5 - 5.2 g/dL   AST 62 (*) 0 - 37 U/L   ALT 24  0 - 35 U/L   Alkaline Phosphatase 116  39 - 117 U/L   Total Bilirubin 4.9 (*) 0.3 - 1.2 mg/dL   GFR calc non Af Amer 22 (*) >90 mL/min   GFR calc Af Amer 26 (*) >90 mL/min   Comment:            The eGFR has been calculated     using the CKD EPI equation.     This calculation has not been     validated in all clinical     situations.     eGFR's persistently     <90 mL/min signify     possible Chronic Kidney Disease.  CBC     Status: Abnormal   Collection Time    11/12/12  6:03 AM      Result Value Range   WBC 15.2 (*) 4.0 - 10.5 K/uL   RBC 3.39 (*) 3.87 - 5.11 MIL/uL   Hemoglobin 10.4 (*) 12.0 - 15.0 g/dL   HCT 09.6 (*) 04.5 - 40.9 %   MCV 99.4  78.0 - 100.0 fL   MCH 30.7  26.0 - 34.0 pg   MCHC 30.9  30.0 - 36.0 g/dL   RDW 81.1 (*) 91.4 - 78.2 %   Platelets 158  150 - 400 K/uL  GLUCOSE, CAPILLARY     Status: Abnormal   Collection Time    11/12/12  7:50 AM      Result Value Range   Glucose-Capillary 57 (*) 70 - 99 mg/dL   Comment 1 Documented in Chart     Comment 2 Notify RN  GLUCOSE, CAPILLARY     Status: Abnormal   Collection Time    11/12/12  8:17 AM      Result Value Range   Glucose-Capillary 59 (*) 70 - 99 mg/dL   Comment 1 Notify RN    GLUCOSE, CAPILLARY     Status: Abnormal   Collection Time    11/12/12  8:45 AM      Result Value Range   Glucose-Capillary 63 (*) 70 - 99 mg/dL   Comment 1 Notify RN    GLUCOSE, CAPILLARY     Status: Abnormal   Collection Time    11/12/12  9:23 AM      Result Value Range   Glucose-Capillary 120 (*) 70 - 99 mg/dL   Comment 1 Notify RN    GLUCOSE, CAPILLARY     Status: Abnormal   Collection Time    11/12/12  1:00 PM      Result Value Range   Glucose-Capillary 128 (*) 70 - 99 mg/dL    Comment 1 Notify RN      Abd 1 View (kub)  11/11/2012  *RADIOLOGY REPORT*  Clinical Data: Abdominal pain with nausea and vomiting.  Reduced bowel sounds.  ABDOMEN - 1 VIEW  Comparison: 10/05/2012  Findings: There is a ovoid shaped gas structure in the midline which likely represents stomach.  Inferior and lateral to this there are moderately distended bowel loops which could represent small bowel or colon.  A similar appearance was noted on the prior exam from 10/05/2012, although the bowel loops are more distended at that time. A linear radiopaque density which was previously located in the right upper quadrant  now projects over the left lower quadrant. A more subtle left lower quadrant radiopaque density located along the left panniculus remains unchanged.  IMPRESSION: Gas-filled mid abdominal loops are not as distended as previously noted, but mildly abnormal. Findings consistent with ileus although early obstruction not excluded.  Previously noted right upper quadrant radiopaque density has transited to the left side of the abdomen.   Original Report Authenticated By: Davonna Belling, M.D.    Dg Chest Port 1 View  11/12/2012  *RADIOLOGY REPORT*  Clinical Data: Cough and shortness of breath.  Fever.  Nausea and vomiting.  PORTABLE CHEST - 1 VIEW  Comparison: Chest x-rays dated 10/05/2012 and 01/20/2012  Findings: There is suggestion of free air under the right hemidiaphragm.  I recommend a left lateral decubitus view of the abdomen for further evaluation of this possibility.  There is chronic cardiomegaly with scarring at the left lung base laterally.  Dense calcification in the mitral valve annulus.  There is slight pulmonary vascular congestion.  No infiltrates.  IMPRESSION:  1.  Possible free air under the right hemidiaphragm.  Left lateral decubitus view of the abdomen recommended for further evaluation. 2.  Pulmonary vascular congestion. 3.  No acute infiltrates.  Critical Value/emergent results were  called by telephone at the time of interpretation on 11/12/2012 at 11:48 a.m. to Mercy Moore, RN, who verbally acknowledged these results.   Original Report Authenticated By: Francene Boyers, M.D.    Dg Abd Decub  11/12/2012  *RADIOLOGY REPORT*  Clinical Data: Abdominal pain and nausea.  Question free intraperitoneal air.  ABDOMEN - 1 VIEW DECUBITUS  Comparison: Plain films abdomen 11/11/2012.  Findings: There is free intraperitoneal air.  IMPRESSION: Study is positive for free intraperitoneal air consistent with bowel perforation.  Critical Value/emergent results were called by telephone at the time of interpretation on 11/12/2012 at  12:53 p.m. to the patient's nurse, Bernette Redbird, who verbally acknowledged these results.   Original Report Authenticated By: Holley Dexter, M.D.     Review of Systems  Constitutional: Negative for fever.       Confused, and not able to remember much detail. Obese BP 90's HR110-120's  Eyes: Negative.   Respiratory: Positive for cough (dry last 24 hours).   Cardiovascular: Negative.   Gastrointestinal: Positive for nausea, vomiting, abdominal pain and blood in stool. Negative for diarrhea and constipation.  Genitourinary: Negative.        Urine output down before readmit.  Musculoskeletal: Positive for back pain and joint pain (knees).  Skin: Negative.   Neurological:       She has had several falls at home recently she attributes to weakness.    Blood pressure 106/54, pulse 113, temperature 98.2 F (36.8 C), temperature source Oral, resp. rate 22, height 5\' 5"  (1.651 m), weight 97.977 kg (216 lb), SpO2 98.00%. Physical Exam  Constitutional:  Obese ill female, slightly tachycardic and BP in the  90's in ICU   HENT:  Head: Normocephalic and atraumatic.  Nose: Nose normal.  Eyes: Conjunctivae and EOM are normal. Pupils are equal, round, and reactive to light. Right eye exhibits no discharge. Left eye exhibits no discharge. No scleral icterus.  Neck: Normal range  of motion. Neck supple. No JVD present. No tracheal deviation present. No thyromegaly present.  Cardiovascular: Regular rhythm, normal heart sounds and intact distal pulses.  Exam reveals no gallop.   No murmur heard. Respiratory: Effort normal and breath sounds normal. No respiratory distress. She has no wheezes. She has no rales. She exhibits no tenderness.  GI: She exhibits distension (Distended and tender, ). There is tenderness. There is rebound and guarding.  Ventral hernia is protuberant and her husband says that is new.  Musculoskeletal: She exhibits no tenderness. Edema: +1 edema both lower legs.  Lymphadenopathy:    She has no cervical adenopathy.  Neurological: She is alert. No cranial nerve deficit.  Skin: Skin is warm and dry. No rash noted. No erythema.  Psychiatric: She has a normal mood and affect.  She is confused and having trouble remembering things.  Says it's the drugs.    Assessment/Plan: 1. Pneumoperitoneum of uncertain etiology. 2.Recurrent GI bleed, of uncertain etiology, foreign body found on w/u 10/07/12. 3. Prior Hernia repair with: Exploratory laparotomy, small bowel resection with primary anastomosis and primary repair of umbilical hernia. 12/09/2006, Leonie Man, M.D.   a) exploratory laparotomy with lysis of adhesions x1 hour  b) primary ventral hernia repair with suture  Surgeon: Dr. Harden Mo, 01/22/2012.  4. Recurrent GI bleed with prior enteroscopy with possible surgical clip found; full workup at [facility] pending. 5. Alcohol  use with possible cirrhosis and withdrawal symptoms. 6.Brain aneurysm with coiling, complicated by CVA 2006. 7. Arthritis 8.Hypertension 9.Body mass index is 35.9  Plan:  She has been seen by Dr. Derrell Lolling and he discussed with patient and her husband the need to go to surgery and see what is causing air.  Pt and her husband agree.  Risk and benefits discussed.   OR ASAP  Will Marlyne Beards PA-C  For Dr.  Derrell Lolling.   Sherrie George 11/12/2012, 1:49 PM

## 2012-11-12 NOTE — Consult Note (Signed)
Referring Provider: Dr. Darnelle Catalan Primary Care Physician:  Judie Petit, MD Primary Gastroenterologist:  Dr.Jacobs  Reason for Consultation:  Probable Alcoholic hepatitis  HPI: [name] [name] is a 67 y.o. female known to Dr. Christella Hartigan from colonoscopy that was done in 2009. At that time she had diverticulosis and internal hemorrhoids. She had an admission in March of 2014 with melena and underwent upper endoscopy per Dr. Russella Dar which showed a severe diffuse gastritis and duodenitis and then colonoscopy which showed moderate diverticulosis and again internal hemorrhoids. As etiology of her bleeding was still unclear she had subsequent capsule endoscopy done which did show an active bleeding source in the proximal small bowel. Her hemoglobin at that time was in the 7.9 range. She underwent small bowel enteroscopy with Dr. Christella Hartigan on 10/05/2012 which showed no active bleeding but there was a metallic object found in the jejunal wall with a surrounding small ulcer. This area was marked with a clip. This area was felt to be potential source of bleeding and surgery was consulted patient has had prior abdominal surgery including hernia repair with mesh. Decision was made not to proceed with surgery and to refer her to Dr. Theodore Demark at [facility] for a complete enteroscopy initially. She was to have this appointment earlier this week. Unfortunately apparently she has had development of progressive weakness and then some nausea and vomiting and her husband brought her to the emergency room yesterday because of the nausea and vomiting. He also had called the office and stated that he felt she was too weak to go [facility] for the procedure. In the emergency room she was noted to be tachycardic and dehydrated with an elevated creatinine and low sodium. She is also felt to be in alcohol withdrawal with her last drink 2 days prior to admission. Patient had admitted to drinking 5 glasses of wine per day and apparently her  husband states that she generally drinks significantly more than this. She has a hyperbilirubinemia and transaminitis and we are asked to consult regarding probable alcoholic hepatitis. There is also concern for possible hepatorenal.  Patient had CT scan of the abdomen and pelvis done on 09/18/2012 which did show fatty liver hepatomegaly but no evidence for cirrhosis. She has gallstones versus sludge and post surgical changes in the anterior pelvic abdominal wall. KUB this admission shows an ileus and a clip in the left lower quadrant, chest x-ray was unremarkable  Since consult was called-pt developed c/o abdominal pain which has become progressively more severe- [place] films today show free air consistent with perforation. Pt has been transferred to ICU, and surgery is seeing.   Past Medical History  Diagnosis Date  . Hypertension   . H/O brain surgery   . Aneurysm of anterior cerebral artery   . Hernia   . Anxiety   . Stroke   . Anemia   . Arthritis     Past Surgical History  Procedure Laterality Date  . Laparotomy  01/22/2012    Procedure: EXPLORATORY LAPAROTOMY;  Surgeon: Emelia Loron, MD;  Location: [facility] OR;  Service: General;  Laterality: N/A;  lysis of adhesions  . Hernia repair    . Esophagogastroduodenoscopy N/A 09/19/2012    Procedure: ESOPHAGOGASTRODUODENOSCOPY (EGD);  Surgeon: Charna Maryln, MD;  Location: [facility] ENDOSCOPY;  Service: Endoscopy;  Laterality: N/A;  . Colonoscopy N/A 09/20/2012    Procedure: COLONOSCOPY;  Surgeon: Charna Ileen, MD;  Location: [facility] ENDOSCOPY;  Service: Endoscopy;  Laterality: N/A;  . Givens capsule study N/A 09/21/2012  Procedure: GIVENS CAPSULE STUDY;  Surgeon: Charna Deasiah, MD;  Location: [facility] ENDOSCOPY;  Service: Endoscopy;  Laterality: N/A;  . Enteroscopy N/A 09/28/2012    Procedure: ENTEROSCOPY;  Surgeon: Rachael Fee, MD;  Location: [facility] ENDOSCOPY;  Service: Endoscopy;  Laterality: N/A;  . Upper gastrointestinal endoscopy  10/05/12  .  Enteroscopy N/A 10/05/2012    Procedure: ENTEROSCOPY;  Surgeon: Meryl Dare, MD;  Location: [facility] ENDOSCOPY;  Service: Endoscopy;  Laterality: N/A;    Prior to Admission medications   Medication Sig Start Date End Date Taking? Authorizing Provider  ALPRAZolam (XANAX) 0.25 MG tablet Take 0.25 mg by mouth daily as needed. For anxiety   Yes Historical Provider, MD  benazepril (LOTENSIN) 20 MG tablet TAKE 1 TABLET ONCE DAILY. 10/05/12  Yes Bruce Romilda Garret, MD  labetalol (NORMODYNE) 100 MG tablet TAKE 1 TABLET TWICE DAILY. 10/05/12  Yes Bruce Romilda Garret, MD  ondansetron (ZOFRAN) 4 MG tablet Take 4 mg by mouth every 8 (eight) hours as needed for nausea. 11/04/12  Yes John L Molpus, MD  pantoprazole (PROTONIX) 40 MG tablet Take 1 tablet (40 mg total) by mouth 2 (two) times daily. 09/22/12  Yes Shanker Levora Dredge, MD  simvastatin (ZOCOR) 40 MG tablet Take 40 mg by mouth every evening.   Yes Historical Provider, MD    Current Facility-Administered Medications  Medication Dose Route Frequency Provider Last Rate Last Dose  . chlordiazePOXIDE (LIBRIUM) capsule 10 mg  10 mg Oral TID Hollice Espy, MD   10 mg at 11/12/12 1059  . dextrose (GLUTOSE) 40 % oral gel 37.5 g  1 Tube Oral PRN Maryruth Bun Rama, MD   37.5 g at 11/12/12 0820  . dextrose 5 %-0.9 % sodium chloride infusion   Intravenous Continuous Maryruth Bun Rama, MD 100 mL/hr at 11/12/12 0907    . [START ON 11/13/2012] Levofloxacin (LEVAQUIN) IVPB 250 mg  250 mg Intravenous Q24H Christina P Rama, MD      . LORazepam (ATIVAN) tablet 1 mg  1 mg Oral Q6H PRN Hollice Espy, MD       Or  . LORazepam (ATIVAN) injection 1 mg  1 mg Intravenous Q6H PRN Hollice Espy, MD      . morphine 2 MG/ML injection 2 mg  2 mg Intravenous Q3H PRN Hollice Espy, MD   2 mg at 11/12/12 1237  . ondansetron (ZOFRAN) tablet 4 mg  4 mg Oral Q6H PRN Hollice Espy, MD       Or  . ondansetron [facility]) injection 4 mg  4 mg Intravenous Q6H PRN Hollice Espy, MD         Allergies as of 11/11/2012 - Review Complete 11/11/2012  Allergen Reaction Noted  . Cephalexin Rash   . Penicillins Rash     Family History  Problem Relation Age of Onset  . Cancer Father     pancreatic  . Cancer Maternal Grandmother     breast    History   Social History  . Marital Status: Married    Spouse Name: N/A    Number of Children: N/A  . Years of Education: N/A   Occupational History  . Not on file.   Social History Main Topics  . Smoking status: Never Smoker   . Smokeless tobacco: Never Used  . Alcohol Use: 12.0 oz/week    20 Glasses of wine per week     Comment: daily 2-3  glasses wine day   . Drug Use: No  .  Sexually Active: Yes    Birth Control/ Protection: Post-menopausal   Other Topics Concern  . Not on file   Social History Narrative  . No narrative on file    Review of Systems: Pertinent positive and negative review of systems were noted in the above HPI section.  All other review of systems was otherwise negative.Marland Kitchen  Physical Exam: Vital signs in last 24 hours: Temp:  [98.2 F (36.8 C)-100.1 F (37.8 C)] 98.2 F (36.8 C) (05/08 0752) Pulse Rate:  [79-120] 113 (05/08 0752) Resp:  [20-22] 22 (05/08 0752) BP: (94-122)/(54-69) 106/54 mmHg (05/08 0752) SpO2:  [95 %-98 %] 98 % (05/08 0752) Weight:  [216 lb (97.977 kg)-225 lb (102.059 kg)] 216 lb (97.977 kg) (05/07 1955) Last BM Date: 11/10/12 General:   Obese, Alert,  acutely ill appearing WF , moaning in pain Head:  Normocephalic and atraumatic. Eyes:  Sclera clear, icterus present   Conjunctiva pink. Ears:  Normal auditory acuity. Nose:  No deformity, discharge,  or lesions. Mouth:  No deformity or lesions.   Neck:  Supple; no masses or thyromegaly. Heart: Tachycardia Lungs:  Decreased BS bilaterally. Heart:  tachy rate and rhythm; no murmurs, clicks, rubs,  or gallops. Abdomen:  Distended,diffusely  Tender,BS quiet, she has a ventral hernia in upper midline, and caput medusa  perumbilically,firm area below umbilicus. Msk:  Symmetrical without gross deformities. . Pulses: Tachycardia Extremities:  Without clubbing, 1+ edema Neurologic:  Alert and  oriented x3  grossly normal neurologically. Skin:  Intact without significant lesions or rashes.. Psych:  Alert and cooperative. Anxious  Intake/Output from previous day: 05/07 0701 - 05/08 0700 In: 1100 [I.V.:1100] Out: 0  Intake/Output this shift:    Lab Results:  Recent Labs  11/11/12 1530 11/12/12 0603  WBC 6.2 15.2*  HGB 11.3* 10.4*  HCT 34.3* 33.7*  PLT 168 158   BMET  Recent Labs  11/11/12 1530 11/12/12 0603  NA 131* 134*  K 4.3 4.2  CL 99 103  CO2 24 21  GLUCOSE 76 59*  BUN 9 13  CREATININE 1.26* 2.20*  CALCIUM 7.4* 7.1*   LFT  Recent Labs  11/12/12 0603  PROT 6.4  ALBUMIN 1.4*  AST 62*  ALT 24  ALKPHOS 116  BILITOT 4.9*   PT/INR No results found for this basename: LABPROT, INR,  in the last 72 hours Hepatitis Panel No results found for this basename: HEPBSAG, HCVAB, HEPAIGM, HEPBIGM,  in the last 72 hours    Studies/Results: Abd 1 View (kub)  11/11/2012  *RADIOLOGY REPORT*  Clinical Data: Abdominal pain with nausea and vomiting.  Reduced bowel sounds.  ABDOMEN - 1 VIEW  Comparison: 10/05/2012  Findings: There is a ovoid shaped gas structure in the midline which likely represents stomach.  Inferior and lateral to this there are moderately distended bowel loops which could represent small bowel or colon.  A similar appearance was noted on the prior exam from 10/05/2012, although the bowel loops are more distended at that time. A linear radiopaque density which was previously located in the right upper quadrant  now projects over the left lower quadrant. A more subtle left lower quadrant radiopaque density located along the left panniculus remains unchanged.  IMPRESSION: Gas-filled mid abdominal loops are not as distended as previously noted, but mildly abnormal. Findings  consistent with ileus although early obstruction not excluded.  Previously noted right upper quadrant radiopaque density has transited to the left side of the abdomen.   Original Report Authenticated By: Jonny Ruiz  Curnes, M.D.    Dg Chest Port 1 View  11/12/2012  *RADIOLOGY REPORT*  Clinical Data: Cough and shortness of breath.  Fever.  Nausea and vomiting.  PORTABLE CHEST - 1 VIEW  Comparison: Chest x-rays dated 10/05/2012 and 01/20/2012  Findings: There is suggestion of free air under the right hemidiaphragm.  I recommend a left lateral decubitus view of the abdomen for further evaluation of this possibility.  There is chronic cardiomegaly with scarring at the left lung base laterally.  Dense calcification in the mitral valve annulus.  There is slight pulmonary vascular congestion.  No infiltrates.  IMPRESSION:  1.  Possible free air under the right hemidiaphragm.  Left lateral decubitus view of the abdomen recommended for further evaluation. 2.  Pulmonary vascular congestion. 3.  No acute infiltrates.  Critical Value/emergent results were called by telephone at the time of interpretation on 11/12/2012 at 11:48 a.m. to Mercy Moore, RN, who verbally acknowledged these results.   Original Report Authenticated By: Francene Boyers, M.D.     IMPRESSION:  #1 67 yo female with perforated bowel/acute abdomen- surgery involved and plan for emergent laparotomy ?wonder if jejunal ulcer/with metallic material (staple) seen on small bowel enteroscopy is source. #2  Elevated LFT's  Most consistent with acute alcoholic hepatitis- clinically she appears to have cirrhosis though CT done 09/2012 did not call cirrhosis Check PT #3 Etoh withdrawal- on prn Ativan, may need scheduled post op #4  ARF-multifactoral #5 Recent acute GI bleed with + capsule endoscopy with jejunal bleed- and enteroscopy showing a probable surgical staple  With adjacent ulcer on 3/31- surgery consulted at that time- and plan was for full small bowel  enteroscopy this week at [facility](did not go  As  too weak)  PLAN: As per surgery today,  Will follow up post op.   Amy Esterwood  11/12/2012, 12:47 PM   GI ATTENDING  X-rays, laboratories, prior endoscopy reports reviewed. Patient personally seen and examined. Agree with history and physical as outlined above. We are asked to see the patient regarding elevated liver tests. She has since developed acute abdominal pain with distention and x-rays consistent with visceral perforation. In terms of her liver, she has alcoholic hepatitis. I feel that she also has an underlying element of cirrhosis and portal hypertension as manifested by Caput Medusae. She also has underlying coagulopathy. In terms of her alcoholic hepatitis, treatment is supportive and complete avoidance of alcohol. However, her more acute problem is her acute abdomen for which emergent surgery as planned. Will follow with you  Wilhemina Bonito. Eda Keys., M.D. [facility] Division of Gastroenterology

## 2012-11-12 NOTE — Progress Notes (Signed)
Received call from Dr. Jena Gauss with radiology.  He states possible free air in abdomen and recommended left lateral decubitus abdominal x-ray.  Dr. Darnelle Catalan notified and order received for x-ray.  Allayne Butcher [facility]  11/12/2012

## 2012-11-13 ENCOUNTER — Encounter (HOSPITAL_COMMUNITY): Payer: Self-pay | Admitting: General Surgery

## 2012-11-13 DIAGNOSIS — F10239 Alcohol dependence with withdrawal, unspecified: Secondary | ICD-10-CM

## 2012-11-13 DIAGNOSIS — J9600 Acute respiratory failure, unspecified whether with hypoxia or hypercapnia: Secondary | ICD-10-CM

## 2012-11-13 DIAGNOSIS — A419 Sepsis, unspecified organism: Secondary | ICD-10-CM

## 2012-11-13 DIAGNOSIS — F10939 Alcohol use, unspecified with withdrawal, unspecified: Secondary | ICD-10-CM

## 2012-11-13 LAB — PREPARE FRESH FROZEN PLASMA
Unit division: 0
Unit division: 0
Unit division: 0

## 2012-11-13 LAB — URINE CULTURE

## 2012-11-13 LAB — COMPREHENSIVE METABOLIC PANEL
ALT: 17 U/L (ref 0–35)
AST: 45 U/L — ABNORMAL HIGH (ref 0–37)
Alkaline Phosphatase: 82 U/L (ref 39–117)
CO2: 23 mEq/L (ref 19–32)
Chloride: 104 mEq/L (ref 96–112)
GFR calc Af Amer: 23 mL/min — ABNORMAL LOW (ref >90)
GFR calc non Af Amer: 20 mL/min — ABNORMAL LOW (ref >90)
Glucose, Bld: 122 mg/dL — ABNORMAL HIGH (ref 70–99)
Potassium: 4 mEq/L (ref 3.5–5.1)
Sodium: 136 mEq/L (ref 135–145)
Total Bilirubin: 4.5 mg/dL — ABNORMAL HIGH (ref 0.3–1.2)

## 2012-11-13 LAB — CBC
HCT: 24.5 % — ABNORMAL LOW (ref 36.0–46.0)
Hemoglobin: 8.6 g/dL — ABNORMAL LOW (ref 12.0–15.0)
Hemoglobin: 7.9 g/dL — ABNORMAL LOW (ref 12.0–15.0)
MCH: 30 pg (ref 26.0–34.0)
MCHC: 31.3 g/dL (ref 30.0–36.0)
MCHC: 32.2 g/dL (ref 30.0–36.0)
RBC: 2.55 MIL/uL — ABNORMAL LOW (ref 3.87–5.11)
RDW: 21.2 % — ABNORMAL HIGH (ref 11.5–15.5)

## 2012-11-13 LAB — BASIC METABOLIC PANEL
BUN: 19 mg/dL (ref 6–23)
CO2: 24 mEq/L (ref 19–32)
Calcium: 6.4 mg/dL — CL (ref 8.4–10.5)
Glucose, Bld: 121 mg/dL — ABNORMAL HIGH (ref 70–99)
Potassium: 3.9 mEq/L (ref 3.5–5.1)
Sodium: 135 mEq/L (ref 135–145)

## 2012-11-13 LAB — PREPARE PLATELET PHERESIS

## 2012-11-13 LAB — PROTIME-INR
INR: 1.92 — ABNORMAL HIGH (ref 0.00–1.49)
INR: 1.9 — ABNORMAL HIGH (ref 0.00–1.49)
Prothrombin Time: 23.6 seconds — ABNORMAL HIGH (ref 11.6–15.2)
Prothrombin Time: 21.2 seconds — ABNORMAL HIGH (ref 11.6–15.2)

## 2012-11-13 LAB — BLOOD GAS, ARTERIAL
Drawn by: 235321
O2 Saturation: 97.3 %
PEEP: 5 cmH2O
Patient temperature: 98.6
RATE: 18 resp/min
pH, Arterial: 7.365 (ref 7.350–7.450)

## 2012-11-13 LAB — CBC WITH DIFFERENTIAL/PLATELET
Eosinophils Absolute: 0 10*3/uL (ref 0.0–0.7)
Eosinophils Relative: 0 % (ref 0–5)
Hemoglobin: 7.7 g/dL — ABNORMAL LOW (ref 12.0–15.0)
Lymphs Abs: 0.6 10*3/uL — ABNORMAL LOW (ref 0.7–4.0)
MCH: 30.1 pg (ref 26.0–34.0)
MCV: 96.5 fL (ref 78.0–100.0)
Monocytes Relative: 9 % (ref 3–12)
RBC: 2.56 MIL/uL — ABNORMAL LOW (ref 3.87–5.11)

## 2012-11-13 LAB — HEMOGLOBIN AND HEMATOCRIT, BLOOD
HCT: 26.5 % — ABNORMAL LOW (ref 36.0–46.0)
Hemoglobin: 8.5 g/dL — ABNORMAL LOW (ref 12.0–15.0)

## 2012-11-13 LAB — LACTIC ACID, PLASMA
Lactic Acid, Venous: 2.2 mmol/L (ref 0.5–2.2)
Lactic Acid, Venous: 2.1 mmol/L (ref 0.5–2.2)

## 2012-11-13 LAB — APTT: aPTT: 41 seconds — ABNORMAL HIGH (ref 24–37)

## 2012-11-13 MED ORDER — VITAMIN K1 10 MG/ML IJ SOLN
5.0000 mg | Freq: Once | INTRAVENOUS | Status: AC
  Administered 2012-11-13: 5 mg via INTRAVENOUS
  Filled 2012-11-13: qty 0.5

## 2012-11-13 MED ORDER — HYDROCORTISONE SOD SUCCINATE 100 MG IJ SOLR
50.0000 mg | Freq: Four times a day (QID) | INTRAMUSCULAR | Status: DC
  Administered 2012-11-13 – 2012-11-16 (×11): 50 mg via INTRAVENOUS
  Filled 2012-11-13 (×3): qty 1
  Filled 2012-11-13 (×3): qty 2
  Filled 2012-11-13 (×3): qty 1
  Filled 2012-11-13: qty 2
  Filled 2012-11-13 (×6): qty 1

## 2012-11-13 MED ORDER — SODIUM CHLORIDE 0.9 % IV BOLUS (SEPSIS)
1000.0000 mL | Freq: Once | INTRAVENOUS | Status: AC
  Administered 2012-11-13: 1000 mL via INTRAVENOUS

## 2012-11-13 NOTE — Progress Notes (Signed)
PULMONARY  / CRITICAL CARE MEDICINE  Name: [name] [name] MRN: [id number] DOB: 08/02/1945    ADMISSION DATE:  11/11/2012 CONSULTATION DATE:  11/12/2012  REFERRING MD :  CCS PRIMARY SERVICE:  PCCM  CHIEF COMPLAINT:  Post op respiratory failure  BRIEF PATIENT DESCRIPTION: 67 yo with alcoholic liver disease was admitted on 5/7 with abdominal pain and was found on 5/8 to have a small bowel perforation.  She was taken to the OR and returned to the ICU with an open wound and mechanically ventilated.   SIGNIFICANT EVENTS / STUDIES:  5/7  Admitted with abdominal pain, nausea, vomiting 5/7  OR >>> Perforated viscus, peritonitis, lysis of adhesions, small bowel repair  LINES / TUBES: OETT 5/8 >>> OGT 5/8 >>> Foley 5/8 >>> R rad A-line 5/8 >>> L IJ CVL 5/8 >>  CULTURES: 5/8 Blood >>> 5/7 Urine >>>neg   ANTIBIOTICS: Aztreonam 5/8 >>>5/9 Flagyl 5/8 >>>5/9 Levaquin 5/8 x 1 Cipro 5/8 x 1 ertapenem 5/9>>>  VITAL SIGNS: Temp:  [97.2 F (36.2 C)-98.6 F (37 C)] 98.6 F (37 C) (05/09 0855) Pulse Rate:  [96-118] 99 (05/09 0855) Resp:  [14-31] 14 (05/09 0855) BP: (80-126)/(44-66) 109/64 mmHg (05/09 0735) SpO2:  [91 %-100 %] 97 % (05/09 0735) Arterial Line BP: (84-117)/(45-65) 104/63 mmHg (05/09 0855) FiO2 (%):  [40 %-100 %] 40 % (05/09 0735) Weight:  [101.1 kg (222 lb 14.2 oz)-105.3 kg (232 lb 2.3 oz)] 105.3 kg (232 lb 2.3 oz) (05/09 0455)  HEMODYNAMICS: CVP:  [10 mmHg-14 mmHg] 14 mmHg VENTILATOR SETTINGS: Vent Mode:  [-] PRVC FiO2 (%):  [40 %-100 %] 40 % Set Rate:  [18 bmp] 18 bmp Vt Set:  [450 mL] 450 mL PEEP:  [5 cmH20] 5 cmH20 Plateau Pressure:  [15 cmH20-26 cmH20] 15 cmH20 INTAKE / OUTPUT: Intake/Output     05/08 0701 - 05/09 0700 05/09 0701 - 05/10 0700   I.V. (mL/kg) 3893.5 (37)    Blood 3101.5    IV Piggyback 1050    Total Intake(mL/kg) 8045 (76.4)    Urine (mL/kg/hr) 115 (0)    Drains 1000 (0.4)    Other 700 (0.3)    Blood 1200 (0.5)    Total Output 3015     Net +5030            PHYSICAL EXAMINATION: General:  Sedated on vent HEENT: NCAT, sleral icterus noted, PERRL, ETT, OG tubes in place PULM: scattered rhonchi  CV: RRR, no mgr AB: central wound vac in place, no bowel sounds Ext: edema in feet, SCD's in place Neuro: arouses to voice, does not follow commands, moves all four ext   LABS:  Recent Labs Lab 11/12/12 0603 11/12/12 1758 11/13/12 0430  NA 134* 137 136  K 4.2 4.0 4.0  CL 103 104 104  CO2 21 22 23   BUN 13 14 18   CREATININE 2.20* 2.36* 2.45*  GLUCOSE 59* 121* 122*    Recent Labs Lab 11/12/12 0603 11/12/12 1758 11/13/12 11/13/12 0430  HGB 10.4* 9.0* 8.5* 8.6*  HCT 33.7* 27.7* 26.5* 27.5*  WBC 15.2* 10.7*  --  13.4*  PLT 158 126*  --  139*   Lab Results  Component Value Date   INR 2.21* 11/13/2012   INR 1.85* 11/12/2012   INR 3.23* 11/12/2012    Recent Labs Lab 11/12/12 0750 11/12/12 0817 11/12/12 0845 11/12/12 0923 11/12/12 1300  GLUCAP 57* 59* 63* 120* 128*   CXR:  5/8 >>> low vol. LLL ATX  ASSESSMENT / PLAN:  PULMONARY A:  Acute post operative respiratory failure.  At risk for TRALI (multiple transfusions). >CXR w/ LLL ATX P:   Full mechanical support Daily SBT, do not extubate as open abdomen / back to OR on Saturday Trend ABG / CXR  CARDIOVASCULAR A: Sepsis / septic shock secondary to peritonitis. Remains on high dose pressors.  P:  Goal MAP>60, wean pressors as able CVP goal >10 Start solucortef 5/9  RENAL A:  AKI.  Hypovolemia. Scr a little worse 5/9 P:   Goal CVP>10 Cont IVFs Renal dose meds   GASTROINTESTINAL A:  Alcoholic cirrhosis.  Acute alcoholic hepatitis.  Perforated viscus in jejunem s/p repair 5/8 P:   CCS following Likely back to OR on Saturday NPO Protonix for GI Px  HEMATOLOGIC A:  Coagulopathy. Significant intraoperative bleeding controlled with platelets and plasma. P:  Vit K; FFP and follow INR Cont PAS  INFECTIOUS A:  Peritonitis.  PCN / Cephalosporin  allergy.  P:   Cultures and antibiotics as per flowsheet above Trend PCT  ENDOCRINE  A:  Hypoglycemia. P:   IVF as above  NEUROLOGIC A:  Acute encephalopathy.  Alcohol abuse. Post op pain. At risk for alcohol withdrawal. P:   Goal RASS  -1 to -2 Thiamin / Folate Fentanyl / Versed   TODAY'S SUMMARY: 67 y/o female with alcoholic cirrhosis admitted 5/7 and taken to the OR on 5/8 for a perforated jejunem.  Intraoperatively she had significant bleeding controlled with plasma/plt transfusions.  Will remain intubated until she goes back to the OR for closure. Still in shock. Will need to send Cortisol level. Will cover w/ ertapenem   I have personally obtained a history, examined the patient, evaluated laboratory and imaging results, formulated the assessment and plan and placed orders.  CRITICAL CARE:  The patient is critically ill with multiple organ systems failure and requires high complexity decision making for assessment and support, frequent evaluation and titration of therapies, application of advanced monitoring technologies and extensive interpretation of multiple databases. Critical Care Time devoted to patient care services described in this note is 45 minutes.   Levy Pupa, MD, PhD 11/13/2012, 11:22 AM [facility] [phone] or if no answer [phone]

## 2012-11-13 NOTE — Anesthesia Postprocedure Evaluation (Signed)
Anesthesia Post Note  Patient: [name] [name]  Procedure(s) Performed: Procedure(s) (LRB): EXPLORATORY LAPAROTOMY  with repair perforated small bowel, abdominal wall debridement, application of wound vac dressing (N/A)  Anesthesia type: General  Patient location: ICU  Post pain: Pain level controlled  Post assessment: Post-op Vital signs reviewed  Last Vitals: BP 106/57  Pulse 103  Temp(Src) 36.8 C (Oral)  Resp 18  Ht 5\' 5"  (1.651 m)  Wt 222 lb 14.2 oz (101.1 kg)  BMI 37.09 kg/m2  SpO2 99%  Post vital signs: Reviewed  Level of consciousness: sedated, intubated   Complications: No apparent anesthesia complications

## 2012-11-13 NOTE — Progress Notes (Signed)
GI ATTENDING  Interval events and operative findings reviewed. Management per GSU and CCM. Will sign off, but we are available if needed. Thanks   Wilhemina Bonito. Eda Keys., M.D. [facility] Division of Gastroenterology

## 2012-11-13 NOTE — Progress Notes (Signed)
CRITICAL VALUE ALERT  Critical value received:  Ca 6.4  Date of notification:  11/13/2012  Time of notification:  1145  Critical value read back: yes  Nurse who received alert:  Delanna Notice RN  MD notified (1st page):  Anders Simmonds NP  Time of first page:  1215   MD notified (2nd page):  Time of second page:  Responding MD:  Anders Simmonds NP  Time MD responded:  [phone]

## 2012-11-13 NOTE — Progress Notes (Signed)
CARE MANAGEMENT NOTE 11/13/2012  Patient:  [name] [name], [name] [name]   Account Number:  [id number]  Date Initiated:  11/13/2012  Documentation initiated by:  Jammi Morrissette  Subjective/Objective Assessment:   pt with perforated bowel, requiring surgical intervention, unable to support resp on vent post op     Action/Plan:   tbd based on progress   Anticipated DC Date:  11/16/2012   Anticipated DC Plan:  HOME/SELF CARE  In-house referral  NA      DC Planning Services  NA      [facility] Choice  NA   Choice offered to / List presented to:  NA   DME arranged  NA      DME agency  NA     HH arranged  NA      HH agency  NA   Status of service:  In process, will continue to follow Medicare Important Message given?  NA - LOS <3 / Initial given by admissions (If response is "NO", the following Medicare IM given date fields will be blank) Date Medicare IM given:   Date Additional Medicare IM given:    Discharge Disposition:    Per UR Regulation:  Reviewed for med. necessity/level of care/duration of stay  If discussed at Long Length of Stay Meetings, dates discussed:    Comments:  [id number] Earlene Plater, RN, BSN, CCM:  CHART REVIEWED AND UPDATED.  Next chart review due on [id number]. NO DISCHARGE NEEDS PRESENT AT THIS TIME. CASE MANAGEMENT [phone]

## 2012-11-13 NOTE — Progress Notes (Signed)
1 Day Post-Op  Subjective: I appreciate all the assessment and interventions by critical care medicine during that night.  Sedated and ventilated.  She now has a central line. CVP 10-14. Unfortunately, urine output marginal.  Creatinine 2.45, BUN 18. Potassium 4.0. Hemoglobin 8.6. Platelet count 139. WBC 13,400. INR 2.21. Prothrombin time 23.6. Lacticacidemia ending down from 4.1 to 2.1.  ABGs reasonable on FiO2 40%.    Objective: Vital signs in last 24 hours: Temp:  [97.2 F (36.2 C)-98.2 F (36.8 C)] 97.7 F (36.5 C) (05/09 0403) Pulse Rate:  [96-118] 106 (05/09 0515) Resp:  [16-31] 17 (05/09 0515) BP: (80-126)/(44-66) 101/57 mmHg (05/09 0403) SpO2:  [91 %-100 %] 97 % (05/09 0515) Arterial Line BP: (84-117)/(45-65) 91/54 mmHg (05/09 0515) FiO2 (%):  [40 %-100 %] 40 % (05/09 0500) Weight:  [222 lb 14.2 oz (101.1 kg)-232 lb 2.3 oz (105.3 kg)] 232 lb 2.3 oz (105.3 kg) (05/09 0455) Last BM Date: 11/10/12  Intake/Output from previous day: 05/08 0701 - 05/09 0700 In: 7352.9 [I.V.:3851.4; Blood:3101.5; IV Piggyback:400] Out: 2985 [Urine:85; Drains:1000; Blood:1200] Intake/Output this shift: Total I/O In: 1688.4 [I.V.:842.4; Blood:546; IV Piggyback:300] Out: 1065 [Urine:65; Drains:1000]  General appearance: intubated and sedated. Response to deep pain. Skin warm and dry. Resp: clear to auscultation bilaterally GI: abdomen obese. Soft. Diffusely tender. Bowel sounds absent. Negative pressure dressing in place. Drainage is serosanguineous And thin, about 1000 cc out overnight. No enteric drainage. NG drainage minimal.  Lab Results:  Results for orders placed during the hospital encounter of 11/11/12 (from the past 24 hour(s))  GAMMA GT     Status: Abnormal   Collection Time    11/12/12  6:00 AM      Result Value Range   GGT 262 (*) 7 - 51 U/L  COMPREHENSIVE METABOLIC PANEL     Status: Abnormal   Collection Time    11/12/12  6:03 AM      Result Value Range   Sodium 134 (*) 135 -  145 mEq/L   Potassium 4.2  3.5 - 5.1 mEq/L   Chloride 103  96 - 112 mEq/L   CO2 21  19 - 32 mEq/L   Glucose, Bld 59 (*) 70 - 99 mg/dL   BUN 13  6 - 23 mg/dL   Creatinine, Ser 9.56 (*) 0.50 - 1.10 mg/dL   Calcium 7.1 (*) 8.4 - 10.5 mg/dL   Total Protein 6.4  6.0 - 8.3 g/dL   Albumin 1.4 (*) 3.5 - 5.2 g/dL   AST 62 (*) 0 - 37 U/L   ALT 24  0 - 35 U/L   Alkaline Phosphatase 116  39 - 117 U/L   Total Bilirubin 4.9 (*) 0.3 - 1.2 mg/dL   GFR calc non Af Amer 22 (*) >90 mL/min   GFR calc Af Amer 26 (*) >90 mL/min  CBC     Status: Abnormal   Collection Time    11/12/12  6:03 AM      Result Value Range   WBC 15.2 (*) 4.0 - 10.5 K/uL   RBC 3.39 (*) 3.87 - 5.11 MIL/uL   Hemoglobin 10.4 (*) 12.0 - 15.0 g/dL   HCT 21.3 (*) 08.6 - 57.8 %   MCV 99.4  78.0 - 100.0 fL   MCH 30.7  26.0 - 34.0 pg   MCHC 30.9  30.0 - 36.0 g/dL   RDW 46.9 (*) 62.9 - 52.8 %   Platelets 158  150 - 400 K/uL  GLUCOSE, CAPILLARY  Status: Abnormal   Collection Time    11/12/12  7:50 AM      Result Value Range   Glucose-Capillary 57 (*) 70 - 99 mg/dL   Comment 1 Documented in Chart     Comment 2 Notify RN    GLUCOSE, CAPILLARY     Status: Abnormal   Collection Time    11/12/12  8:17 AM      Result Value Range   Glucose-Capillary 59 (*) 70 - 99 mg/dL   Comment 1 Notify RN    GLUCOSE, CAPILLARY     Status: Abnormal   Collection Time    11/12/12  8:45 AM      Result Value Range   Glucose-Capillary 63 (*) 70 - 99 mg/dL   Comment 1 Notify RN    GLUCOSE, CAPILLARY     Status: Abnormal   Collection Time    11/12/12  9:23 AM      Result Value Range   Glucose-Capillary 120 (*) 70 - 99 mg/dL   Comment 1 Notify RN    GLUCOSE, CAPILLARY     Status: Abnormal   Collection Time    11/12/12  1:00 PM      Result Value Range   Glucose-Capillary 128 (*) 70 - 99 mg/dL   Comment 1 Notify RN    MRSA PCR SCREENING     Status: None   Collection Time    11/12/12  1:28 PM      Result Value Range   MRSA by PCR NEGATIVE   NEGATIVE  APTT     Status: Abnormal   Collection Time    11/12/12  1:34 PM      Result Value Range   aPTT 49 (*) 24 - 37 seconds  PROTIME-INR     Status: Abnormal   Collection Time    11/12/12  1:34 PM      Result Value Range   Prothrombin Time 31.2 (*) 11.6 - 15.2 seconds   INR 3.23 (*) 0.00 - 1.49  TYPE AND SCREEN     Status: None   Collection Time    11/12/12  1:35 PM      Result Value Range   ABO/RH(D) O NEG     Antibody Screen NEG     Sample Expiration 11/15/2012     Unit Number [id number]     Blood Component Type RED CELLS,LR     Unit division 00     Status of Unit ISSUED     Transfusion Status OK TO TRANSFUSE     Crossmatch Result COMPATIBLE     Donor AG Type NEGATIVE FOR KELL ANTIGEN     Unit Number [id number]     Blood Component Type RED CELLS,LR     Unit division 00     Status of Unit ISSUED     Transfusion Status OK TO TRANSFUSE     Crossmatch Result COMPATIBLE     Donor AG Type NEGATIVE FOR KELL ANTIGEN     Unit Number [id number]     Blood Component Type RED CELLS,LR     Unit division 00     Status of Unit ALLOCATED     Transfusion Status OK TO TRANSFUSE     Crossmatch Result COMPATIBLE     Donor AG Type NEGATIVE FOR KELL ANTIGEN     Unit Number [id number]     Blood Component Type RED CELLS,LR     Unit division 00     Status of Unit ALLOCATED  Transfusion Status OK TO TRANSFUSE     Crossmatch Result COMPATIBLE     Donor AG Type NEGATIVE FOR KELL ANTIGEN     Unit Number [id number]     Blood Component Type RED CELLS,LR     Unit division 00     Status of Unit ALLOCATED     Transfusion Status OK TO TRANSFUSE     Crossmatch Result COMPATIBLE     Donor AG Type NEGATIVE FOR KELL ANTIGEN     Unit Number [id number]     Blood Component Type RED CELLS,LR     Unit division 00     Status of Unit ALLOCATED     Transfusion Status OK TO TRANSFUSE     Crossmatch Result COMPATIBLE     Donor AG Type NEGATIVE FOR KELL ANTIGEN    PREPARE  PLATELET PHERESIS     Status: None   Collection Time    11/12/12  1:40 PM      Result Value Range   Unit Number [id number]     Blood Component Type PLTPHER LR1     Unit division 00     Status of Unit ISSUED     Transfusion Status OK TO TRANSFUSE    PREPARE RBC (CROSSMATCH)     Status: None   Collection Time    11/12/12  2:31 PM      Result Value Range   Order Confirmation ORDER PROCESSED BY BLOOD BANK    PREPARE FRESH FROZEN PLASMA     Status: None   Collection Time    11/12/12  3:00 PM      Result Value Range   Unit Number [id number]     Blood Component Type THAWED PLASMA     Unit division 00     Status of Unit ISSUED     Transfusion Status OK TO TRANSFUSE     Unit Number [id number]     Blood Component Type THAWED PLASMA     Unit division 00     Status of Unit ISSUED     Transfusion Status OK TO TRANSFUSE     Unit Number [id number]     Blood Component Type THAWED PLASMA     Unit division 00     Status of Unit ISSUED     Transfusion Status OK TO TRANSFUSE     Unit Number [id number]     Blood Component Type THAWED PLASMA     Unit division 00     Status of Unit ISSUED     Transfusion Status OK TO TRANSFUSE     Unit Number [id number]     Blood Component Type THAWED PLASMA     Unit division 00     Status of Unit ISSUED     Transfusion Status OK TO TRANSFUSE     Unit Number [id number]     Blood Component Type THAWED PLASMA     Unit division 00     Status of Unit ISSUED     Transfusion Status OK TO TRANSFUSE    BLOOD GAS, ARTERIAL     Status: Abnormal   Collection Time    11/12/12  5:52 PM      Result Value Range   FIO2 1.00     Delivery systems VENTILATOR     Mode PRESSURE REGULATED VOLUME CONTROL     VT 450     Rate 18.0     Peep/cpap 5.0     pH, Arterial 7.293 (*) 7.350 - 7.450  pCO2 arterial 43.5  35.0 - 45.0 mmHg   pO2, Arterial 261.0 (*) 80.0 - 100.0 mmHg   Bicarbonate 20.4  20.0 - 24.0 mEq/L   TCO2 19.6  0 - 100 mmol/L   Acid-base  deficit 5.2 (*) 0.0 - 2.0 mmol/L   O2 Saturation 99.3     Patient temperature 98.6     Collection site A-LINE     Drawn by DRAWN BY RN     Sample type ARTERIAL DRAW     Allens test (pass/fail) PASS  PASS  LACTIC ACID, PLASMA     Status: Abnormal   Collection Time    11/12/12  5:53 PM      Result Value Range   Lactic Acid, Venous 4.1 (*) 0.5 - 2.2 mmol/L  PROCALCITONIN     Status: None   Collection Time    11/12/12  5:57 PM      Result Value Range   Procalcitonin 2.51    CBC     Status: Abnormal   Collection Time    11/12/12  5:58 PM      Result Value Range   WBC 10.7 (*) 4.0 - 10.5 K/uL   RBC 2.87 (*) 3.87 - 5.11 MIL/uL   Hemoglobin 9.0 (*) 12.0 - 15.0 g/dL   HCT 78.2 (*) 95.6 - 21.3 %   MCV 96.5  78.0 - 100.0 fL   MCH 31.4  26.0 - 34.0 pg   MCHC 32.5  30.0 - 36.0 g/dL   RDW 08.6 (*) 57.8 - 46.9 %   Platelets 126 (*) 150 - 400 K/uL  COMPREHENSIVE METABOLIC PANEL     Status: Abnormal   Collection Time    11/12/12  5:58 PM      Result Value Range   Sodium 137  135 - 145 mEq/L   Potassium 4.0  3.5 - 5.1 mEq/L   Chloride 104  96 - 112 mEq/L   CO2 22  19 - 32 mEq/L   Glucose, Bld 121 (*) 70 - 99 mg/dL   BUN 14  6 - 23 mg/dL   Creatinine, Ser 6.29 (*) 0.50 - 1.10 mg/dL   Calcium 6.6 (*) 8.4 - 10.5 mg/dL   Total Protein 5.2 (*) 6.0 - 8.3 g/dL   Albumin 1.7 (*) 3.5 - 5.2 g/dL   AST 40 (*) 0 - 37 U/L   ALT 17  0 - 35 U/L   Alkaline Phosphatase 81  39 - 117 U/L   Total Bilirubin 3.9 (*) 0.3 - 1.2 mg/dL   GFR calc non Af Amer 21 (*) >90 mL/min   GFR calc Af Amer 24 (*) >90 mL/min  PROTIME-INR     Status: Abnormal   Collection Time    11/12/12  5:58 PM      Result Value Range   Prothrombin Time 20.7 (*) 11.6 - 15.2 seconds   INR 1.85 (*) 0.00 - 1.49  PREPARE PLATELET PHERESIS     Status: None   Collection Time    11/12/12  6:00 PM      Result Value Range   Unit Number [id number]     Blood Component Type PLTPHER LR1     Unit division 00     Status of Unit  ALLOCATED     Transfusion Status OK TO TRANSFUSE    LACTIC ACID, PLASMA     Status: None   Collection Time    11/13/12 12:00 AM      Result Value Range  Lactic Acid, Venous 2.2  0.5 - 2.2 mmol/L  HEMOGLOBIN AND HEMATOCRIT, BLOOD     Status: Abnormal   Collection Time    11/13/12 12:00 AM      Result Value Range   Hemoglobin 8.5 (*) 12.0 - 15.0 g/dL   HCT 40.9 (*) 81.1 - 91.4 %  BLOOD GAS, ARTERIAL     Status: Abnormal   Collection Time    11/13/12  4:15 AM      Result Value Range   FIO2 0.40     Delivery systems VENTILATOR     Mode PRESSURE REGULATED VOLUME CONTROL     VT 0.450     Rate 18     Peep/cpap 5.0     pH, Arterial 7.365  7.350 - 7.450   pCO2 arterial 39.4  35.0 - 45.0 mmHg   pO2, Arterial 94.0  80.0 - 100.0 mmHg   Bicarbonate 22.0  20.0 - 24.0 mEq/L   TCO2 20.8  0 - 100 mmol/L   Acid-base deficit 2.5 (*) 0.0 - 2.0 mmol/L   O2 Saturation 97.3     Patient temperature 98.6     Collection site A-LINE     Drawn by [id number]     Sample type ARTERIAL DRAW    COMPREHENSIVE METABOLIC PANEL     Status: Abnormal   Collection Time    11/13/12  4:30 AM      Result Value Range   Sodium 136  135 - 145 mEq/L   Potassium 4.0  3.5 - 5.1 mEq/L   Chloride 104  96 - 112 mEq/L   CO2 23  19 - 32 mEq/L   Glucose, Bld 122 (*) 70 - 99 mg/dL   BUN 18  6 - 23 mg/dL   Creatinine, Ser 2.13 (*) 0.50 - 1.10 mg/dL   Calcium 6.7 (*) 8.4 - 10.5 mg/dL   Total Protein 5.0 (*) 6.0 - 8.3 g/dL   Albumin 1.7 (*) 3.5 - 5.2 g/dL   AST 45 (*) 0 - 37 U/L   ALT 17  0 - 35 U/L   Alkaline Phosphatase 82  39 - 117 U/L   Total Bilirubin 4.5 (*) 0.3 - 1.2 mg/dL   GFR calc non Af Amer 20 (*) >90 mL/min   GFR calc Af Amer 23 (*) >90 mL/min  CBC     Status: Abnormal   Collection Time    11/13/12  4:30 AM      Result Value Range   WBC 13.4 (*) 4.0 - 10.5 K/uL   RBC 2.87 (*) 3.87 - 5.11 MIL/uL   Hemoglobin 8.6 (*) 12.0 - 15.0 g/dL   HCT 08.6 (*) 57.8 - 46.9 %   MCV 95.8  78.0 - 100.0 fL   MCH 30.0   26.0 - 34.0 pg   MCHC 31.3  30.0 - 36.0 g/dL   RDW 62.9 (*) 52.8 - 41.3 %   Platelets 139 (*) 150 - 400 K/uL  LACTIC ACID, PLASMA     Status: None   Collection Time    11/13/12  4:30 AM      Result Value Range   Lactic Acid, Venous 2.1  0.5 - 2.2 mmol/L  APTT     Status: Abnormal   Collection Time    11/13/12  4:30 AM      Result Value Range   aPTT 41 (*) 24 - 37 seconds  PROTIME-INR     Status: Abnormal   Collection Time    11/13/12  4:30 AM      Result Value Range   Prothrombin Time 23.6 (*) 11.6 - 15.2 seconds   INR 2.21 (*) 0.00 - 1.49     Studies/Results: @RISRSLT24 @  . antiseptic oral rinse  1 application Mouth Rinse QID  . aztreonam  1 g Intravenous Q8H  . chlorhexidine  15 mL Mouth/Throat BID  . ertapenem (INVANZ) IV  1 g Intravenous Q24H  . folic acid  1 mg Intravenous Daily  . metronidazole  500 mg Intravenous Q8H  . pantoprazole (PROTONIX) IV  40 mg Intravenous Q12H  . phytonadione (VITAMIN K) IV  5 mg Intravenous Once  . thiamine  100 mg Intravenous Daily     Assessment/Plan: s/p Procedure(s): EXPLORATORY LAPAROTOMY  with repair perforated small bowel, abdominal wall debridement, application of wound vac dressing  POD #1. Exploratory laparotomy, repair small bowel perforation, application negative pressure dressing for damage control for diffuse peritonitis. No unusual findings on abdominal exam or negative pressure drainage. Anticipate return to OR tomorrow for washout and possible closure.  Coagulopathy. Probably multifactorial due to his cirrhosis and sepsis. We'll continue to give vitamin K and FFP in hopes of getting INR less than 2 before tomorrow morning's OR.  Oliguric renal failure. Although CVP seems adequate, we'll give another fluid bolus. Doubt abdominal compartment syndrome due to decompressive laparotomy.  Septic shock. Remains on levaphed.. She is on multiple antibiotics, , and I would be in favor of simplifying these if okay with  CCM.  Alcoholic cirrhosis. This has contributed significantly to her coagulopathy and overall prognosis.  Obesity.  VDRF. Per CCM.  @PROBHOSP @  LOS: 2 days    Kacin Dancy M. Derrell Lolling, M.D., [facility] General and Minimally invasive Surgery Breast and Colorectal Surgery Office:   [phone] Pager:   [phone]  11/13/2012  . .prob

## 2012-11-13 NOTE — Progress Notes (Signed)
INITIAL NUTRITION ASSESSMENT  DOCUMENTATION CODES Per approved criteria  -Obesity Unspecified   INTERVENTION: Recommend initiation parenteral nutrition post surgery if pt to remain NPO  NUTRITION DIAGNOSIS: Inadequate oral intake related to inability to eat as evidenced by NPO status, vent status, and perforated bowel.   Goal: Provide 60-70% of estimated calorie needs (22-25 kcals/kg ideal body weight) and 100% of estimated protein needs, based on ASPEN guidelines for permissive underfeeding in critically ill obese individuals.   Monitor:  Vent status Weight Labs  Reason for Assessment: New Vent  67 y.o. female  Admitting Dx: Perforation bowel  ASSESSMENT: 67 yo with alcoholic liver disease was admitted on 5/7 with abdominal pain and was found on 5/8 to have a small bowel perforation. She was taken to the OR and returned to the ICU with an open wound and mechanically ventilated.  Pt on full mechanical support with plans for pt to return to surgery on Saturday. Pt at risk for alcohol withdrawal and receiving thiamin and folic acid injections.  Pt's husband reports that pt has been eating very little for the past week and that pt usually weighs 215 lbs. Pt follows a low salt diet at home; po intake varies per husbands report due to pt being sick for the past year.  Height: Ht Readings from Last 1 Encounters:  11/12/12 5\' 5"  (1.651 m)    Weight: Wt Readings from Last 1 Encounters:  11/13/12 232 lb 2.3 oz (105.3 kg)    Ideal Body Weight: 125 lbs  % Ideal Body Weight: 186%  Wt Readings from Last 10 Encounters:  11/13/12 232 lb 2.3 oz (105.3 kg)  11/13/12 232 lb 2.3 oz (105.3 kg)  10/05/12 220 lb (99.791 kg)  10/05/12 220 lb (99.791 kg)  09/28/12 205 lb (92.987 kg)  09/28/12 205 lb (92.987 kg)  09/25/12 212 lb (96.163 kg)  09/21/12 200 lb (90.719 kg)  09/21/12 200 lb (90.719 kg)  09/21/12 200 lb (90.719 kg)   Usual Body Weight: 215 lbs per pt's husband  % Usual  Body Weight: 108%  BMI:  Body mass index is 38.63 kg/(m^2).  Patient is currently intubated on ventilator support.  MV: 8.3 Temp:Temp (24hrs), Avg:98 F (36.7 C), Min:97.2 F (36.2 C), Max:98.9 F (37.2 C)  Estimated Nutritional Needs: Kcal: 1801 (goal is to provide 1080-1260 kcal per day) Protein: 114-125 grams Fluid: 2.7-2.8 L  If pt is extubated estimated needs are 2100-2300 kcals and 105-126 grams of protein  Skin: +1 generalized edema, +1 RLE and LLE edema, abdominal incision  Diet Order: NPO  EDUCATION NEEDS: -No education needs identified at this time   Intake/Output Summary (Last 24 hours) at 11/13/12 1142 Last data filed at 11/13/12 1135  Gross per 24 hour  Intake 8861.64 ml  Output   3195 ml  Net 5666.64 ml    Last BM: 5/6  Labs:   Recent Labs Lab 11/12/12 0603 11/12/12 1758 11/13/12 0430  NA 134* 137 136  K 4.2 4.0 4.0  CL 103 104 104  CO2 21 22 23   BUN 13 14 18   CREATININE 2.20* 2.36* 2.45*  CALCIUM 7.1* 6.6* 6.7*  GLUCOSE 59* 121* 122*    CBG (last 3)   Recent Labs  11/12/12 0845 11/12/12 0923 11/12/12 1300  GLUCAP 63* 120* 128*    Scheduled Meds: . antiseptic oral rinse  1 application Mouth Rinse QID  . chlorhexidine  15 mL Mouth/Throat BID  . ertapenem (INVANZ) IV  1 g Intravenous Q24H  .  folic acid  1 mg Intravenous Daily  . hydrocortisone sod succinate (SOLU-CORTEF) inj  50 mg Intravenous Q6H  . pantoprazole (PROTONIX) IV  40 mg Intravenous Q12H  . phytonadione (VITAMIN K) IV  5 mg Intravenous Once  . thiamine  100 mg Intravenous Daily    Continuous Infusions: . sodium chloride 75 mL/hr at 11/12/12 2100  . fentaNYL infusion INTRAVENOUS 75 mcg/hr (11/13/12 0930)  . midazolam (VERSED) infusion 3 mg/hr (11/13/12 0930)  . norepinephrine (LEVOPHED) Adult infusion 10 mcg/min (11/13/12 1000)    Past Medical History  Diagnosis Date  . Hypertension   . H/O brain surgery   . Aneurysm of anterior cerebral artery   . Hernia    . Anxiety   . Stroke   . Anemia   . Arthritis     Past Surgical History  Procedure Laterality Date  . Laparotomy  01/22/2012    Procedure: EXPLORATORY LAPAROTOMY;  Surgeon: Emelia Loron, MD;  Location: [facility] OR;  Service: General;  Laterality: N/A;  lysis of adhesions  . Hernia repair    . Esophagogastroduodenoscopy N/A 09/19/2012    Procedure: ESOPHAGOGASTRODUODENOSCOPY (EGD);  Surgeon: Charna Haeli, MD;  Location: [facility] ENDOSCOPY;  Service: Endoscopy;  Laterality: N/A;  . Colonoscopy N/A 09/20/2012    Procedure: COLONOSCOPY;  Surgeon: Charna Pallie, MD;  Location: [facility] ENDOSCOPY;  Service: Endoscopy;  Laterality: N/A;  . Givens capsule study N/A 09/21/2012    Procedure: GIVENS CAPSULE STUDY;  Surgeon: Charna Danniell, MD;  Location: [facility] ENDOSCOPY;  Service: Endoscopy;  Laterality: N/A;  . Enteroscopy N/A 09/28/2012    Procedure: ENTEROSCOPY;  Surgeon: Rachael Fee, MD;  Location: [facility] ENDOSCOPY;  Service: Endoscopy;  Laterality: N/A;  . Upper gastrointestinal endoscopy  10/05/12  . Enteroscopy N/A 10/05/2012    Procedure: ENTEROSCOPY;  Surgeon: Meryl Dare, MD;  Location: [facility] ENDOSCOPY;  Service: Endoscopy;  Laterality: N/A;    Ian Malkin RD, LDN Inpatient Clinical Dietitian Pager: [phone] After Hours Pager: [phone]

## 2012-11-14 ENCOUNTER — Inpatient Hospital Stay (HOSPITAL_COMMUNITY): Payer: BC Managed Care – PPO | Admitting: Certified Registered Nurse Anesthetist

## 2012-11-14 ENCOUNTER — Inpatient Hospital Stay (HOSPITAL_COMMUNITY): Payer: BC Managed Care – PPO

## 2012-11-14 ENCOUNTER — Encounter (HOSPITAL_COMMUNITY): Payer: Self-pay | Admitting: Certified Registered Nurse Anesthetist

## 2012-11-14 ENCOUNTER — Encounter (HOSPITAL_COMMUNITY)
Admission: EM | Disposition: A | Discharge status: Skilled Nursing Facility | Payer: Self-pay | Source: Home / Self Care | Attending: Pulmonary Disease

## 2012-11-14 DIAGNOSIS — K7460 Unspecified cirrhosis of liver: Secondary | ICD-10-CM

## 2012-11-14 DIAGNOSIS — D689 Coagulation defect, unspecified: Secondary | ICD-10-CM

## 2012-11-14 HISTORY — PX: WOUND DEBRIDEMENT: SHX247

## 2012-11-14 LAB — PROCALCITONIN: Procalcitonin: 1.54 ng/mL

## 2012-11-14 LAB — CBC
Hemoglobin: 7.6 g/dL — ABNORMAL LOW (ref 12.0–15.0)
RBC: 2.47 MIL/uL — ABNORMAL LOW (ref 3.87–5.11)
WBC: 8.6 10*3/uL (ref 4.0–10.5)

## 2012-11-14 LAB — PREPARE FRESH FROZEN PLASMA: Unit division: 0

## 2012-11-14 LAB — COMPREHENSIVE METABOLIC PANEL
AST: 52 U/L — ABNORMAL HIGH (ref 0–37)
Albumin: 1.7 g/dL — ABNORMAL LOW (ref 3.5–5.2)
Alkaline Phosphatase: 81 U/L (ref 39–117)
Chloride: 104 mEq/L (ref 96–112)
Creatinine, Ser: 1.95 mg/dL — ABNORMAL HIGH (ref 0.50–1.10)
Potassium: 3.9 mEq/L (ref 3.5–5.1)
Sodium: 135 mEq/L (ref 135–145)
Total Bilirubin: 4 mg/dL — ABNORMAL HIGH (ref 0.3–1.2)

## 2012-11-14 LAB — BLOOD GAS, ARTERIAL
Drawn by: 36529
O2 Saturation: 94.6 %
PEEP: 5 cmH2O
Patient temperature: 98.6
RATE: 18 resp/min
pO2, Arterial: 73 mmHg — ABNORMAL LOW (ref 80.0–100.0)

## 2012-11-14 LAB — PROTIME-INR
INR: 2.05 — ABNORMAL HIGH (ref 0.00–1.49)
Prothrombin Time: 22.3 seconds — ABNORMAL HIGH (ref 11.6–15.2)

## 2012-11-14 SURGERY — DEBRIDEMENT, WOUND, ABDOMEN
Anesthesia: General | Wound class: Contaminated

## 2012-11-14 MED ORDER — MIDAZOLAM HCL 5 MG/5ML IJ SOLN
INTRAMUSCULAR | Status: DC | PRN
  Administered 2012-11-14: 2 mg via INTRAVENOUS

## 2012-11-14 MED ORDER — VITAL AF 1.2 CAL PO LIQD
1000.0000 mL | ORAL | Status: DC
  Administered 2012-11-14: 1000 mL
  Filled 2012-11-14 (×3): qty 1000

## 2012-11-14 MED ORDER — LIP MEDEX EX OINT
1.0000 "application " | TOPICAL_OINTMENT | Freq: Two times a day (BID) | CUTANEOUS | Status: DC
  Administered 2012-11-14 – 2012-12-03 (×35): 1 via TOPICAL
  Filled 2012-11-14: qty 7

## 2012-11-14 MED ORDER — LACTATED RINGERS IV SOLN
INTRAVENOUS | Status: DC
  Administered 2012-11-14 – 2012-11-15 (×3): via INTRAVENOUS

## 2012-11-14 MED ORDER — SODIUM CHLORIDE 0.9 % IR SOLN
Status: DC | PRN
  Administered 2012-11-14: 4000 mL

## 2012-11-14 MED ORDER — VITAMIN K1 10 MG/ML IJ SOLN
10.0000 mg | Freq: Every day | INTRAMUSCULAR | Status: AC
  Administered 2012-11-14 – 2012-11-15 (×2): 10 mg via SUBCUTANEOUS
  Filled 2012-11-14 (×3): qty 1

## 2012-11-14 MED ORDER — CALCIUM GLUCONATE 10 % IV SOLN: INTRAVENOUS | Status: DC | PRN

## 2012-11-14 MED ORDER — SODIUM CHLORIDE 0.9 % IV SOLN
25.0000 ug/h | INTRAVENOUS | Status: DC
  Administered 2012-11-14: 200 ug/h via INTRAVENOUS
  Administered 2012-11-16: 01:00:00 via INTRAVENOUS
  Filled 2012-11-14 (×2): qty 50

## 2012-11-14 MED ORDER — NEPRO/CARBSTEADY PO LIQD
1000.0000 mL | ORAL | Status: DC
  Filled 2012-11-14: qty 1000

## 2012-11-14 MED ORDER — LACTATED RINGERS IV BOLUS (SEPSIS)
1000.0000 mL | Freq: Three times a day (TID) | INTRAVENOUS | Status: DC | PRN
  Administered 2012-11-14 – 2012-11-15 (×2): 1000 mL via INTRAVENOUS

## 2012-11-14 MED ORDER — CISATRACURIUM BESYLATE (PF) 10 MG/5ML IV SOLN
INTRAVENOUS | Status: DC | PRN
  Administered 2012-11-14: 10 mg via INTRAVENOUS
  Administered 2012-11-14: 8 mg via INTRAVENOUS

## 2012-11-14 MED ORDER — BISACODYL 10 MG RE SUPP: 10.0000 mg | Freq: Two times a day (BID) | RECTAL | Status: DC | PRN

## 2012-11-14 MED ORDER — SODIUM CHLORIDE 0.9 % IV SOLN
INTRAVENOUS | Status: DC | PRN
  Administered 2012-11-14: 10:00:00 via INTRAVENOUS

## 2012-11-14 MED ORDER — SODIUM CHLORIDE 0.9 % IV SOLN
1.0000 g | INTRAVENOUS | Status: AC
  Administered 2012-11-14: 1 g via INTRAVENOUS
  Filled 2012-11-14: qty 10

## 2012-11-14 MED ORDER — CHLORHEXIDINE GLUCONATE 0.12 % MT SOLN
15.0000 mL | Freq: Two times a day (BID) | OROMUCOSAL | Status: DC
  Administered 2012-11-14 – 2012-11-20 (×13): 15 mL via OROMUCOSAL
  Filled 2012-11-14 (×13): qty 15

## 2012-11-14 MED ORDER — FENTANYL CITRATE 0.05 MG/ML IJ SOLN
INTRAMUSCULAR | Status: DC | PRN
  Administered 2012-11-14 (×2): 50 ug via INTRAVENOUS

## 2012-11-14 MED ORDER — LACTATED RINGERS IV SOLN
INTRAVENOUS | Status: DC | PRN
  Administered 2012-11-14: 10:00:00 via INTRAVENOUS

## 2012-11-14 MED ORDER — FENTANYL CITRATE 0.05 MG/ML IJ SOLN
25.0000 ug | INTRAMUSCULAR | Status: AC | PRN
  Administered 2012-11-16 – 2012-11-18 (×6): 50 ug via INTRAVENOUS
  Filled 2012-11-14 (×6): qty 2

## 2012-11-14 SURGICAL SUPPLY — 39 items
APPLICATOR COTTON TIP 6IN STRL (MISCELLANEOUS) ×1 IMPLANT
BLADE EXTENDED COATED 6.5IN (ELECTRODE) IMPLANT
BLADE HEX COATED 2.75 (ELECTRODE) ×3 IMPLANT
CANISTER SUCTION 2500CC (MISCELLANEOUS) ×1 IMPLANT
CLOTH BEACON ORANGE TIMEOUT ST (SAFETY) ×3 IMPLANT
COVER MAYO STAND STRL (DRAPES) IMPLANT
DRAPE LAPAROSCOPIC ABDOMINAL (DRAPES) ×3 IMPLANT
DRAPE WARM FLUID 44X44 (DRAPE) ×2 IMPLANT
DRSG VAC ATS MED SENSATRAC (GAUZE/BANDAGES/DRESSINGS) ×4 IMPLANT
ELECT REM PT RETURN 9FT ADLT (ELECTROSURGICAL) ×3
ELECTRODE REM PT RTRN 9FT ADLT (ELECTROSURGICAL) ×2 IMPLANT
GLOVE BIOGEL PI IND STRL 7.0 (GLOVE) ×2 IMPLANT
GLOVE BIOGEL PI INDICATOR 7.0 (GLOVE) ×1
GLOVE ECLIPSE 8.0 STRL XLNG CF (GLOVE) ×3 IMPLANT
GLOVE INDICATOR 8.0 STRL GRN (GLOVE) ×3 IMPLANT
GOWN STRL NON-REIN LRG LVL3 (GOWN DISPOSABLE) ×1 IMPLANT
GOWN STRL REIN XL XLG (GOWN DISPOSABLE) ×8 IMPLANT
KIT BASIN OR (CUSTOM PROCEDURE TRAY) ×3 IMPLANT
MANIFOLD NEPTUNE II (INSTRUMENTS) ×2 IMPLANT
NS IRRIG 1000ML POUR BTL (IV SOLUTION) ×5 IMPLANT
PACK GENERAL/GYN (CUSTOM PROCEDURE TRAY) ×3 IMPLANT
SPONGE GAUZE 4X4 12PLY (GAUZE/BANDAGES/DRESSINGS) ×1 IMPLANT
SPONGE LAP 18X18 X RAY DECT (DISPOSABLE) IMPLANT
STAPLER VISISTAT 35W (STAPLE) ×1 IMPLANT
SUCTION POOLE TIP (SUCTIONS) ×2 IMPLANT
SUT NOVA 1 T20/GS 25DT (SUTURE) ×10 IMPLANT
SUT PDS AB 1 CTX 36 (SUTURE) IMPLANT
SUT SILK 2 0 (SUTURE)
SUT SILK 2 0 SH CR/8 (SUTURE) IMPLANT
SUT SILK 2-0 18XBRD TIE 12 (SUTURE) IMPLANT
SUT SILK 3 0 (SUTURE)
SUT SILK 3 0 SH CR/8 (SUTURE) IMPLANT
SUT SILK 3-0 18XBRD TIE 12 (SUTURE) IMPLANT
SUT VICRYL 2 0 18  UND BR (SUTURE)
SUT VICRYL 2 0 18 UND BR (SUTURE) IMPLANT
TOWEL OR 17X26 10 PK STRL BLUE (TOWEL DISPOSABLE) ×6 IMPLANT
TRAY FOLEY CATH 14FRSI W/METER (CATHETERS) IMPLANT
WATER STERILE IRR 1500ML POUR (IV SOLUTION) ×1 IMPLANT
YANKAUER SUCT BULB TIP NO VENT (SUCTIONS) IMPLANT

## 2012-11-14 NOTE — Progress Notes (Signed)
Brief Nutrition Note  Consult received for enteral/tube feeding initiation and management.  Pt currently ordered for "Nepro at 10 ml/hr, increase by 10 ml/hr q 4 hours to goal of 30 ml/hr, do not advance per CCS." Per chart review, pt would benefit from elemental formula to promote better GI tolerance. RD to change formula to Vital AF 1.2.   RD to order Vital AF 1.2 at same rate with same advancement as previous orders. Will initiate Vital AF 1.2 at 10 ml/hr, advance by 10 ml/hr every 4 hours, to goal of Vital 1.2 at 30 ml/hr per CCS. Hold at this rate per CCS.  Adult Enteral Nutrition Protocol initiated. Follow-up note to follow.  Admitting Dx: Alcohol withdrawal [291.81] Abdominal pain, generalized [789.07] Dehydration [276.51] Nausea & vomiting [787.01] Increased bilirubin level [277.4]  Body mass index is 38.63 kg/(m^2). Pt meets criteria for Obese Class II based on current BMI.  Labs:   Recent Labs Lab 11/13/12 0430 11/13/12 1045 11/14/12 0400  NA 136 135 135  K 4.0 3.9 3.9  CL 104 105 104  CO2 23 24 24   BUN 18 19 25*  CREATININE 2.45* 2.37* 1.95*  CALCIUM 6.7* 6.4* 6.7*  GLUCOSE 122* 121* 114*    Jarold Motto MS, [place], LDN Pager: [phone] After-hours pager: [phone]

## 2012-11-14 NOTE — Preoperative (Signed)
Beta Blockers   Reason not to administer Beta Blockers:Not Applicable 

## 2012-11-14 NOTE — Anesthesia Postprocedure Evaluation (Signed)
  Anesthesia Post-op Note  Patient: [name] [name]  Procedure(s) Performed: Procedure(s): Washout Abdominal Wound, debridement of Fascia, Partial Closure  Patient Location: ICU  Anesthesia Type:General  Level of Consciousness: Patient remains intubated per anesthesia plan  Airway and Oxygen Therapy: Patient remains intubated per anesthesia plan  Post-op Pain: Unable to access at this time.  Post-op Assessment: Post-op Vital signs reviewed  Post-op Vital Signs: stable  Complications: No apparent anesthesia complications

## 2012-11-14 NOTE — Op Note (Signed)
11/11/2012 - 11/14/2012  10:51 AM  PATIENT:  [name] [name]  67 y.o. female  Patient Care Team: Lindley Magnus, MD as PCP - General  PRE-OPERATIVE DIAGNOSIS:  Open Abdominal Wound, Jejunal Perforation,S/P Repair  POST-OPERATIVE DIAGNOSIS:  Open Abdominal Wound, Jejunal Perforation,S/P Repair, Necrotic Fascia  PROCEDURE:  Procedure(s): Washout peritoneum Debridement of Fascia Abdominal wall Closure  SURGEON:  Surgeon(s): Ardeth Sportsman, MD  ASSISTANT: RN FA Adolm Joseph   ANESTHESIA:   general  EBL:  Total I/O In: 1139.6 [I.V.:115.6; Blood:1024] Out: 327 [Urine:145; Drains:107; Other:50; Blood:25]  Delay start of Pharmacological VTE agent (>24hrs) due to surgical blood loss or risk of bleeding:  yes  DRAINS: none   SPECIMEN:  No Specimen  DISPOSITION OF SPECIMEN:  N/A  COUNTS:  YES  PLAN OF CARE: Admit to inpatient   PATIENT DISPOSITION:  ICU - intubated and critically ill.  INDICATION: Morbidly obese female with cirrhosis.  Acute abdomen.  Underwent laparotomy and repair of jejunal perforation.  In multisystem organ failure including coagulopathy.  Abdomen not completely closed.  Peritoneal wound VAC placed.  Patient more stable.  Recommended return for washout and possible at least partial closure.  I discussed this with her husband.  Technique risks benefits and alternatives discussed.  Questions answered and he agreed to proceed.  OR FINDINGS: Intestinal closure intact.  Tissue is viable.  Mild edema but not severe.  Some bilious ascites but no severe peritonitis.  Fascia able to be completely closed.  Wound VAC is over subcutaneous tissues and fascial closure.  DESCRIPTION: The patient arrived from the intensive care unit intubated with appropriate lines.  Transfer went smoothly.  Transferred into the room.  Informed consent was confirmed.   She remained on IV Invanz. She underwent general anesthesia without any difficulty. She was positioned supine. SCDs were  active during the entire case.  Outer cover of wound vac removed.  Her abdomen was prepped and draped in a sterile fashion.  A surgical timeout confirmed our plan.  I removed the intraperitoneal portion of the wound VAC.  Surgical repair of the jejunal perforation intact.  Tissues pink & viable.  Mild edema but not severe.  I carefully aspirated and washed out the abdominal contents in all four quadrants and pelvis.  No abscess found.  Mild bilious ascites.  Abdomen somewhat frozen with soft interloop adhesions of intestine.  I did mild finger fraction and found no retained pockets.  Left the repair alone.  The omentum was minimal and not able to be mobilized over the repair.  Washed out with 3 L crystalloid with clear return.    I began to close the abdomen using #1 Novafil interrupted sutures starting at the lower corner.  Was able to get it together.  The abdomen was not severely edematous and while obese did not have increased tension beyond her morbid obesity.  Around the periumbilical region there were some necrotic fascia/hernia sac most likely from s/p umbilicectomy & prior hernia repair.  Debridement some of that back to healthier tissue with cautery given her coagulopathy.  Ultimately I was able to completely close the abdomen.  Pulmonary peak pressures remained in the 30s and unchanged.  I thought the risk of abdominal compartment syndrome was low.  I placed a wound VAC sponge over the fascial closure.  Patient has been transferred to the ICU in critical but unchanged condition.  I discussed the postoperative findings in detail with the patient's husband and extended family.  Postoperative course  was discussed in detail.  Questions were answered.  They expressed understanding and appreciation.

## 2012-11-14 NOTE — Progress Notes (Signed)
Tube feed started per order at 1300. Xray 5/8 confirmed placement of NG tube in tip of stomach.

## 2012-11-14 NOTE — Addendum Note (Signed)
Addendum created 11/14/12 1208 by Mechele Dawley, CRNA   Modules edited: Anesthesia Medication Administration

## 2012-11-14 NOTE — Progress Notes (Signed)
Adjusted FIO2 from 40 to 60 per morning blood gas

## 2012-11-14 NOTE — Anesthesia Preprocedure Evaluation (Signed)
Anesthesia Evaluation  Patient identified by MRN, date of birth, ID band Patient awake    Reviewed: Allergy & Precautions, H&P , NPO status , Patient's Chart, lab work & pertinent test results  Airway Mallampati: I TM Distance: >3 FB Neck ROM: full    Dental  (+) Dental Advisory Given   Pulmonary neg pulmonary ROS, shortness of breath and with exertion, pneumonia -, resolved,  Patient remains on ventilator breath sounds clear to auscultation        Cardiovascular hypertension, Pt. on medications and Pt. on home beta blockers + Peripheral Vascular Disease negative cardio ROS  Rhythm:regular Rate:Normal     Neuro/Psych PSYCHIATRIC DISORDERS Anxiety CVA, Residual Symptoms negative neurological ROS     GI/Hepatic negative GI ROS, (+)     substance abuse  alcohol use, Hepatitis -  Endo/Other  Morbid obesity  Renal/GU Renal diseasenegative Renal ROS     Musculoskeletal negative musculoskeletal ROS (+)   Abdominal (+) + obese,   Peds  Hematology negative hematology ROS (+)   Anesthesia Other Findings Bowel obstruction  Reproductive/Obstetrics negative OB ROS                           Anesthesia Physical Anesthesia Plan  ASA: IV  Anesthesia Plan: General   Post-op Pain Management:    Induction: Intravenous  Airway Management Planned: Oral ETT  Additional Equipment:   Intra-op Plan:   Post-operative Plan: Post-operative intubation/ventilation  Informed Consent: I have reviewed the patients History and Physical, chart, labs and discussed the procedure including the risks, benefits and alternatives for the proposed anesthesia with the patient or authorized representative who has indicated his/her understanding and acceptance.     Plan Discussed with: CRNA  Anesthesia Plan Comments:         Anesthesia Quick Evaluation

## 2012-11-14 NOTE — Progress Notes (Signed)
[name] [name] [id number] 1945/08/07  CARE TEAM:  PCP: Judie Petit, MD  Outpatient Care Team: Patient Care Team: Lindley Magnus, MD as PCP - General  Inpatient Treatment Team: Treatment Team: Attending Provider: Lonia Farber, MD; Consulting Physician: Bishop Limbo, MD; Rounding Team: Md Pccm, MD; Registered Nurse: Gerald Stabs, RN; Registered Nurse: Fenton Foy, RN   Subjective:  No major events On Levophed pressors 4 Husband & RN in room  Objective:  Vital signs:  Filed Vitals:   11/14/12 0410 11/14/12 0500 11/14/12 0600 11/14/12 0700  BP:      Pulse: 77 78 95 88  Temp:      TempSrc:      Resp: 18 18 24 17   Height:      Weight:      SpO2: 97% 96% 98% 93%    Last BM Date: 11/10/12 (PTA; per chart)  Intake/Output   Yesterday:  05/09 0701 - 05/10 0700 In: 3245.2 [I.V.:2209.2; Blood:936; IV Piggyback:100] Out: 2555 [Urine:630; Emesis/NG output:150; Drains:1775] This shift:     Bowel function:  Flatus: n  BM: n  Drain: n/a  Physical Exam:  General: Pt intubated/sedated but stable Eyes: PERRL, normal EOM.  Sclera clear.  No icterus Neuro: CN II-XII intact w/o focal sensory/motor deficits. Lymph: No head/neck/groin lymphadenopathy Psych:  No delerium/psychosis/paranoia HENT: Normocephalic, Mucus membranes moist.  No thrush Neck: Supple, No tracheal deviation.  ETT & OGT in place Chest: No chest wall pain w good excursion.  Coarse BS B CV:  Pulses intact.  Regular rhythm MS: Normal AROM mjr joints.  No obvious deformity Abdomen: Soft.  Obese.  Wound vac in place.  Serosanguinous return in vac.  No incarcerated hernias. Ext:  SCDs BLE. No cyanosis Skin: No petechiae / purpura.  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 female  2 Days Post-Op  Procedure(s): EXPLORATORY LAPAROTOMY  with repair perforated small bowel, abdominal wall debridement, application of wound vac dressing  Fair but not declining / stabilizing  Plan:  -ex lap with abd washout & probable partial closure of fascia - doubt total possible with ++I/O, ARF, mod vent needs:  The anatomy & physiology of the digestive tract was discussed.  The pathophysiology of perforation was discussed.  Washout/exploration recommended. Natural history risks without surgery such as death was discussed.  I recommended abdominal exploration to diagnose & treat the source of the problem.  Open techniques were discussed.   Risks such as bleeding, infection, abscess, leak, reoperation, bowel resection, possible ostomy, hernia, heart attack, death, and other risks were discussed.   The risks of no intervention will lead to serious problems including death.   I expressed a good likelihood that surgery will address the problem.    Goals of post-operative recovery were discussed as well.  We will work  to minimize complications although risks in an emergent setting are high.   Questions were answered.  The patient's husband expressed understanding & wishes to proceed with surgery.        -vitK/FFP to correct coagulopathy as possible (resistant so far) -Levophed for BP support -diuresis when more stable -OGT decompression -VTE prophylaxis- SCDs, etc -mobilize as tolerated to help recovery  [name] [name], M.D., F.A.C.S. [facility] [address] [place], [place] [address] [phone] Main / Paging   11/14/2012   Results:   Labs: Results for orders placed during the hospital encounter of 11/11/12 (from the past 48 hour(s))  GLUCOSE, CAPILLARY     Status: Abnormal   Collection Time    11/12/12  7:50 AM      Result Value Range   Glucose-Capillary 57 (*) 70 - 99 mg/dL   Comment 1 Documented in Chart     Comment 2 Notify RN    GLUCOSE, CAPILLARY     Status: Abnormal   Collection Time    11/12/12  8:17 AM      Result Value Range   Glucose-Capillary 59 (*) 70 - 99 mg/dL   Comment 1 Notify RN    GLUCOSE, CAPILLARY     Status: Abnormal   Collection Time    11/12/12  8:45 AM      Result Value Range   Glucose-Capillary 63 (*) 70 - 99 mg/dL   Comment 1 Notify RN    GLUCOSE, CAPILLARY     Status: Abnormal   Collection Time    11/12/12  9:23 AM      Result Value Range   Glucose-Capillary 120 (*) 70 - 99 mg/dL   Comment 1 Notify RN    GLUCOSE, CAPILLARY     Status: Abnormal   Collection Time    11/12/12  1:00 PM      Result Value Range   Glucose-Capillary 128 (*) 70 - 99 mg/dL   Comment 1 Notify RN    MRSA PCR SCREENING     Status: None   Collection Time    11/12/12  1:28 PM      Result Value Range   MRSA by PCR NEGATIVE  NEGATIVE   Comment:            The GeneXpert MRSA Assay (FDA     approved for NASAL specimens     only), is one component of a     comprehensive MRSA colonization     surveillance program. It is not     intended to diagnose MRSA     infection nor to guide or     monitor treatment for     MRSA infections.  APTT     Status: Abnormal   Collection Time    11/12/12  1:34 PM      Result Value Range   aPTT 49 (*) 24 - 37 seconds   Comment:            IF BASELINE aPTT IS ELEVATED,     SUGGEST PATIENT RISK ASSESSMENT     BE USED TO DETERMINE APPROPRIATE     ANTICOAGULANT THERAPY.  PROTIME-INR     Status: Abnormal   Collection Time     11/12/12  1:34 PM      Result Value Range   Prothrombin Time 31.2 (*) 11.6 - 15.2 seconds   INR 3.23 (*) 0.00 - 1.49  TYPE AND SCREEN  Status: None   Collection Time    11/12/12  1:35 PM      Result Value Range   ABO/RH(D) O NEG     Antibody Screen NEG     Sample Expiration 11/15/2012     Unit Number [id number]     Blood Component Type RED CELLS,LR     Unit division 00     Status of Unit ISSUED,FINAL     Transfusion Status OK TO TRANSFUSE     Crossmatch Result COMPATIBLE     Donor AG Type NEGATIVE FOR KELL ANTIGEN     Unit Number [id number]     Blood Component Type RED CELLS,LR     Unit division 00     Status of Unit ISSUED,FINAL     Transfusion Status OK TO TRANSFUSE     Crossmatch Result COMPATIBLE     Donor AG Type NEGATIVE FOR KELL ANTIGEN     Unit Number [id number]     Blood Component Type RED CELLS,LR     Unit division 00     Status of Unit ALLOCATED     Transfusion Status OK TO TRANSFUSE     Crossmatch Result COMPATIBLE     Donor AG Type NEGATIVE FOR KELL ANTIGEN     Unit Number [id number]     Blood Component Type RED CELLS,LR     Unit division 00     Status of Unit ALLOCATED     Transfusion Status OK TO TRANSFUSE     Crossmatch Result COMPATIBLE     Donor AG Type NEGATIVE FOR KELL ANTIGEN     Unit Number [id number]     Blood Component Type RED CELLS,LR     Unit division 00     Status of Unit ALLOCATED     Transfusion Status OK TO TRANSFUSE     Crossmatch Result COMPATIBLE     Donor AG Type NEGATIVE FOR KELL ANTIGEN     Unit Number [id number]     Blood Component Type RED CELLS,LR     Unit division 00     Status of Unit ALLOCATED     Transfusion Status OK TO TRANSFUSE     Crossmatch Result COMPATIBLE     Donor AG Type NEGATIVE FOR KELL ANTIGEN    PREPARE PLATELET PHERESIS     Status: None   Collection Time    11/12/12  1:40 PM      Result Value Range   Unit Number [id number]     Blood Component Type PLTPHER LR1     Unit division  00     Status of Unit ISSUED,FINAL     Transfusion Status OK TO TRANSFUSE    PREPARE RBC (CROSSMATCH)     Status: None   Collection Time    11/12/12  2:31 PM      Result Value Range   Order Confirmation ORDER PROCESSED BY BLOOD BANK    PREPARE FRESH FROZEN PLASMA     Status: None   Collection Time    11/12/12  3:00 PM      Result Value Range   Unit Number [id number]     Blood Component Type THAWED PLASMA     Unit division 00     Status of Unit ISSUED,FINAL     Transfusion Status OK TO TRANSFUSE     Unit Number [id number]     Blood Component Type THAWED PLASMA     Unit division 00     Status of Unit ISSUED,FINAL  Transfusion Status OK TO TRANSFUSE     Unit Number [id number]     Blood Component Type THAWED PLASMA     Unit division 00     Status of Unit ISSUED,FINAL     Transfusion Status OK TO TRANSFUSE     Unit Number [id number]     Blood Component Type THAWED PLASMA     Unit division 00     Status of Unit ISSUED,FINAL     Transfusion Status OK TO TRANSFUSE     Unit Number [id number]     Blood Component Type THAWED PLASMA     Unit division 00     Status of Unit ISSUED,FINAL     Transfusion Status OK TO TRANSFUSE     Unit Number [id number]     Blood Component Type THAWED PLASMA     Unit division 00     Status of Unit ISSUED,FINAL     Transfusion Status OK TO TRANSFUSE    BLOOD GAS, ARTERIAL     Status: Abnormal   Collection Time    11/12/12  5:52 PM      Result Value Range   FIO2 1.00     Delivery systems VENTILATOR     Mode PRESSURE REGULATED VOLUME CONTROL     VT 450     Rate 18.0     Peep/cpap 5.0     pH, Arterial 7.293 (*) 7.350 - 7.450   pCO2 arterial 43.5  35.0 - 45.0 mmHg   pO2, Arterial 261.0 (*) 80.0 - 100.0 mmHg   Bicarbonate 20.4  20.0 - 24.0 mEq/L   TCO2 19.6  0 - 100 mmol/L   Acid-base deficit 5.2 (*) 0.0 - 2.0 mmol/L   O2 Saturation 99.3     Patient temperature 98.6     Collection site A-LINE     Drawn by DRAWN BY RN      Sample type ARTERIAL DRAW     Allens test (pass/fail) PASS  PASS  LACTIC ACID, PLASMA     Status: Abnormal   Collection Time    11/12/12  5:53 PM      Result Value Range   Lactic Acid, Venous 4.1 (*) 0.5 - 2.2 mmol/L  PROCALCITONIN     Status: None   Collection Time    11/12/12  5:57 PM      Result Value Range   Procalcitonin 2.51     Comment:            Interpretation:     PCT > 2 ng/mL:     Systemic infection (sepsis) is likely,     unless other causes are known.     (NOTE)             ICU PCT Algorithm               Non ICU PCT Algorithm        ----------------------------     ------------------------------             PCT < 0.25 ng/mL                 PCT < 0.1 ng/mL         Stopping of antibiotics            Stopping of antibiotics           strongly encouraged.               strongly encouraged.        ----------------------------     ------------------------------  PCT level decrease by               PCT < 0.25 ng/mL           >= 80% from peak PCT           OR PCT 0.25 - 0.5 ng/mL          Stopping of antibiotics                                                 encouraged.         Stopping of antibiotics               encouraged.        ----------------------------     ------------------------------           PCT level decrease by              PCT >= 0.25 ng/mL           < 80% from peak PCT            AND PCT >= 0.5 ng/mL            Continuing antibiotics                                                  encouraged.           Continuing antibiotics                encouraged.        ----------------------------     ------------------------------         PCT level increase compared          PCT > 0.5 ng/mL             with peak PCT AND              PCT >= 0.5 ng/mL             Escalation of antibiotics                                              strongly encouraged.          Escalation of antibiotics            strongly encouraged.  CBC     Status: Abnormal    Collection Time    11/12/12  5:58 PM      Result Value Range   WBC 10.7 (*) 4.0 - 10.5 K/uL   RBC 2.87 (*) 3.87 - 5.11 MIL/uL   Hemoglobin 9.0 (*) 12.0 - 15.0 g/dL   HCT 40.9 (*) 81.1 - 91.4 %   MCV 96.5  78.0 - 100.0 fL   MCH 31.4  26.0 - 34.0 pg   MCHC 32.5  30.0 - 36.0 g/dL   RDW 78.2 (*) 95.6 - 21.3 %   Platelets 126 (*) 150 - 400 K/uL   Comment: REPEATED TO VERIFY  COMPREHENSIVE METABOLIC PANEL     Status: Abnormal   Collection Time    11/12/12  5:58 PM  Result Value Range   Sodium 137  135 - 145 mEq/L   Potassium 4.0  3.5 - 5.1 mEq/L   Chloride 104  96 - 112 mEq/L   CO2 22  19 - 32 mEq/L   Glucose, Bld 121 (*) 70 - 99 mg/dL   BUN 14  6 - 23 mg/dL   Creatinine, Ser 1.61 (*) 0.50 - 1.10 mg/dL   Calcium 6.6 (*) 8.4 - 10.5 mg/dL   Total Protein 5.2 (*) 6.0 - 8.3 g/dL   Albumin 1.7 (*) 3.5 - 5.2 g/dL   AST 40 (*) 0 - 37 U/L   ALT 17  0 - 35 U/L   Alkaline Phosphatase 81  39 - 117 U/L   Total Bilirubin 3.9 (*) 0.3 - 1.2 mg/dL   GFR calc non Af Amer 21 (*) >90 mL/min   GFR calc Af Amer 24 (*) >90 mL/min   Comment:            The eGFR has been calculated     using the CKD EPI equation.     This calculation has not been     validated in all clinical     situations.     eGFR's persistently     <90 mL/min signify     possible Chronic Kidney Disease.  PROTIME-INR     Status: Abnormal   Collection Time    11/12/12  5:58 PM      Result Value Range   Prothrombin Time 20.7 (*) 11.6 - 15.2 seconds   INR 1.85 (*) 0.00 - 1.49  PREPARE PLATELET PHERESIS     Status: None   Collection Time    11/12/12  6:00 PM      Result Value Range   Unit Number [id number]     Blood Component Type PLTPHER LR1     Unit division 00     Status of Unit REL FROM [facility]     Transfusion Status OK TO TRANSFUSE    LACTIC ACID, PLASMA     Status: None   Collection Time    11/13/12 12:00 AM      Result Value Range   Lactic Acid, Venous 2.2  0.5 - 2.2 mmol/L  HEMOGLOBIN AND HEMATOCRIT, BLOOD      Status: Abnormal   Collection Time    11/13/12 12:00 AM      Result Value Range   Hemoglobin 8.5 (*) 12.0 - 15.0 g/dL   HCT 14.7 (*) 82.9 - 56.2 %  BLOOD GAS, ARTERIAL     Status: Abnormal   Collection Time    11/13/12  4:15 AM      Result Value Range   FIO2 0.40     Delivery systems VENTILATOR     Mode PRESSURE REGULATED VOLUME CONTROL     VT 0.450     Rate 18     Peep/cpap 5.0     pH, Arterial 7.365  7.350 - 7.450   pCO2 arterial 39.4  35.0 - 45.0 mmHg   pO2, Arterial 94.0  80.0 - 100.0 mmHg   Bicarbonate 22.0  20.0 - 24.0 mEq/L   TCO2 20.8  0 - 100 mmol/L   Acid-base deficit 2.5 (*) 0.0 - 2.0 mmol/L   O2 Saturation 97.3     Patient temperature 98.6     Collection site A-LINE     Drawn by [id number]     Sample type ARTERIAL DRAW    COMPREHENSIVE METABOLIC PANEL     Status:  Abnormal   Collection Time    11/13/12  4:30 AM      Result Value Range   Sodium 136  135 - 145 mEq/L   Potassium 4.0  3.5 - 5.1 mEq/L   Chloride 104  96 - 112 mEq/L   CO2 23  19 - 32 mEq/L   Glucose, Bld 122 (*) 70 - 99 mg/dL   BUN 18  6 - 23 mg/dL   Creatinine, Ser 1.61 (*) 0.50 - 1.10 mg/dL   Calcium 6.7 (*) 8.4 - 10.5 mg/dL   Total Protein 5.0 (*) 6.0 - 8.3 g/dL   Albumin 1.7 (*) 3.5 - 5.2 g/dL   AST 45 (*) 0 - 37 U/L   ALT 17  0 - 35 U/L   Alkaline Phosphatase 82  39 - 117 U/L   Total Bilirubin 4.5 (*) 0.3 - 1.2 mg/dL   GFR calc non Af Amer 20 (*) >90 mL/min   GFR calc Af Amer 23 (*) >90 mL/min   Comment:            The eGFR has been calculated     using the CKD EPI equation.     This calculation has not been     validated in all clinical     situations.     eGFR's persistently     <90 mL/min signify     possible Chronic Kidney Disease.  CBC     Status: Abnormal   Collection Time    11/13/12  4:30 AM      Result Value Range   WBC 13.4 (*) 4.0 - 10.5 K/uL   RBC 2.87 (*) 3.87 - 5.11 MIL/uL   Hemoglobin 8.6 (*) 12.0 - 15.0 g/dL   HCT 09.6 (*) 04.5 - 40.9 %   MCV 95.8  78.0 - 100.0  fL   MCH 30.0  26.0 - 34.0 pg   MCHC 31.3  30.0 - 36.0 g/dL   RDW 81.1 (*) 91.4 - 78.2 %   Platelets 139 (*) 150 - 400 K/uL  PROCALCITONIN     Status: None   Collection Time    11/13/12  4:30 AM      Result Value Range   Procalcitonin 2.78     Comment:            Interpretation:     PCT > 2 ng/mL:     Systemic infection (sepsis) is likely,     unless other causes are known.     (NOTE)             ICU PCT Algorithm               Non ICU PCT Algorithm        ----------------------------     ------------------------------             PCT < 0.25 ng/mL                 PCT < 0.1 ng/mL         Stopping of antibiotics            Stopping of antibiotics           strongly encouraged.               strongly encouraged.        ----------------------------     ------------------------------           PCT level decrease by  PCT < 0.25 ng/mL           >= 80% from peak PCT           OR PCT 0.25 - 0.5 ng/mL          Stopping of antibiotics                                                 encouraged.         Stopping of antibiotics               encouraged.        ----------------------------     ------------------------------           PCT level decrease by              PCT >= 0.25 ng/mL           < 80% from peak PCT            AND PCT >= 0.5 ng/mL            Continuing antibiotics                                                  encouraged.           Continuing antibiotics                encouraged.        ----------------------------     ------------------------------         PCT level increase compared          PCT > 0.5 ng/mL             with peak PCT AND              PCT >= 0.5 ng/mL             Escalation of antibiotics                                              strongly encouraged.          Escalation of antibiotics            strongly encouraged.  LACTIC ACID, PLASMA     Status: None   Collection Time    11/13/12  4:30 AM      Result Value Range   Lactic Acid,  Venous 2.1  0.5 - 2.2 mmol/L  APTT     Status: Abnormal   Collection Time    11/13/12  4:30 AM      Result Value Range   aPTT 41 (*) 24 - 37 seconds   Comment:            IF BASELINE aPTT IS ELEVATED,     SUGGEST PATIENT RISK ASSESSMENT     BE USED TO DETERMINE APPROPRIATE     ANTICOAGULANT THERAPY.  PROTIME-INR     Status: Abnormal   Collection Time    11/13/12  4:30 AM      Result Value Range   Prothrombin Time 23.6 (*) 11.6 -  15.2 seconds   INR 2.21 (*) 0.00 - 1.49  PREPARE FRESH FROZEN PLASMA     Status: None   Collection Time    11/13/12  6:30 AM      Result Value Range   Unit Number [id number]     Blood Component Type THAWED PLASMA     Unit division 00     Status of Unit ISSUED,FINAL     Transfusion Status OK TO TRANSFUSE     Unit Number [id number]     Blood Component Type THAWED PLASMA     Unit division 00     Status of Unit ISSUED,FINAL     Transfusion Status OK TO TRANSFUSE     Unit Number [id number]     Blood Component Type THAWED PLASMA     Unit division 00     Status of Unit ISSUED,FINAL     Transfusion Status OK TO TRANSFUSE    PROTIME-INR     Status: Abnormal   Collection Time    11/13/12 10:45 AM      Result Value Range   Prothrombin Time 23.2 (*) 11.6 - 15.2 seconds   INR 2.16 (*) 0.00 - 1.49  BASIC METABOLIC PANEL     Status: Abnormal   Collection Time    11/13/12 10:45 AM      Result Value Range   Sodium 135  135 - 145 mEq/L   Potassium 3.9  3.5 - 5.1 mEq/L   Chloride 105  96 - 112 mEq/L   CO2 24  19 - 32 mEq/L   Glucose, Bld 121 (*) 70 - 99 mg/dL   BUN 19  6 - 23 mg/dL   Creatinine, Ser 4.40 (*) 0.50 - 1.10 mg/dL   Calcium 6.4 (*) 8.4 - 10.5 mg/dL   Comment: CRITICAL RESULT CALLED TO, READ BACK BY AND VERIFIED WITH:     EUBANKS,M. RN 1141 11/13/12, NONAN,J.   GFR calc non Af Amer 21 (*) >90 mL/min   GFR calc Af Amer 24 (*) >90 mL/min   Comment:            The eGFR has been calculated     using the CKD EPI equation.     This  calculation has not been     validated in all clinical     situations.     eGFR's persistently     <90 mL/min signify     possible Chronic Kidney Disease.  CBC WITH DIFFERENTIAL     Status: Abnormal   Collection Time    11/13/12 10:45 AM      Result Value Range   WBC 12.7 (*) 4.0 - 10.5 K/uL   RBC 2.56 (*) 3.87 - 5.11 MIL/uL   Hemoglobin 7.7 (*) 12.0 - 15.0 g/dL   HCT 34.7 (*) 42.5 - 95.6 %   MCV 96.5  78.0 - 100.0 fL   MCH 30.1  26.0 - 34.0 pg   MCHC 31.2  30.0 - 36.0 g/dL   RDW 38.7 (*) 56.4 - 33.2 %   Platelets 138 (*) 150 - 400 K/uL   Neutrophils Relative 86 (*) 43 - 77 %   Comment: CORRECTED ON 05/09 AT 1118: PREVIOUSLY REPORTED AS 85   Neutro Abs 11.0 (*) 1.7 - 7.7 K/uL   Comment: CORRECTED ON 05/09 AT 1118: PREVIOUSLY REPORTED AS 10.8   Lymphocytes Relative 5 (*) 12 - 46 %   Lymphs Abs 0.6 (*) 0.7 - 4.0 K/uL   Comment: CORRECTED ON 05/09  AT 1118: PREVIOUSLY REPORTED AS 0.7   Monocytes Relative 9  3 - 12 %   Monocytes Absolute 1.1 (*) 0.1 - 1.0 K/uL   Comment: CORRECTED ON 05/09 AT 1118: PREVIOUSLY REPORTED AS 1.2   Eosinophils Relative 0  0 - 5 %   Eosinophils Absolute 0.0  0.0 - 0.7 K/uL   Basophils Relative 0  0 - 1 %   Basophils Absolute 0.0  0.0 - 0.1 K/uL   WBC Morphology MILD LEFT SHIFT (1-5% METAS, OCC MYELO, OCC BANDS)     Comment: DOHLE BODIES   RBC Morphology POLYCHROMASIA PRESENT    CORTISOL     Status: None   Collection Time    11/13/12 10:45 AM      Result Value Range   Cortisol, Plasma 69.9     Comment: (NOTE)     AM:  4.3 - 22.4 ug/dL     PM:  3.1 - 82.9 ug/dL  CBC     Status: Abnormal   Collection Time    11/13/12  2:30 PM      Result Value Range   WBC 10.4  4.0 - 10.5 K/uL   RBC 2.55 (*) 3.87 - 5.11 MIL/uL   Hemoglobin 7.9 (*) 12.0 - 15.0 g/dL   HCT 56.2 (*) 13.0 - 86.5 %   MCV 96.1  78.0 - 100.0 fL   MCH 31.0  26.0 - 34.0 pg   MCHC 32.2  30.0 - 36.0 g/dL   RDW 78.4 (*) 69.6 - 29.5 %   Platelets 126 (*) 150 - 400 K/uL  PROTIME-INR      Status: Abnormal   Collection Time    11/13/12  2:30 PM      Result Value Range   Prothrombin Time 21.2 (*) 11.6 - 15.2 seconds   INR 1.92 (*) 0.00 - 1.49  PROTIME-INR     Status: Abnormal   Collection Time    11/13/12  6:15 PM      Result Value Range   Prothrombin Time 21.1 (*) 11.6 - 15.2 seconds   INR 1.90 (*) 0.00 - 1.49  BLOOD GAS, ARTERIAL     Status: Abnormal   Collection Time    11/14/12  3:59 AM      Result Value Range   FIO2 0.40     Delivery systems VENTILATOR     Mode PRESSURE REGULATED VOLUME CONTROL     VT 0.450     Rate 18     Peep/cpap 5.0     pH, Arterial 7.406  7.350 - 7.450   pCO2 arterial 37.5  35.0 - 45.0 mmHg   pO2, Arterial 73.0 (*) 80.0 - 100.0 mmHg   Bicarbonate 23.1  20.0 - 24.0 mEq/L   TCO2 22.1  0 - 100 mmol/L   Acid-base deficit 0.9  0.0 - 2.0 mmol/L   O2 Saturation 94.6     Patient temperature 98.6     Collection site A-LINE     Drawn by [phone]     Sample type ARTERIAL DRAW    PROCALCITONIN     Status: None   Collection Time    11/14/12  4:00 AM      Result Value Range   Procalcitonin 1.54     Comment:            Interpretation:     PCT > 0.5 ng/mL and <= 2 ng/mL:     Systemic infection (sepsis) is possible,     but other conditions  are known to elevate     PCT as well.     (NOTE)             ICU PCT Algorithm               Non ICU PCT Algorithm        ----------------------------     ------------------------------             PCT < 0.25 ng/mL                 PCT < 0.1 ng/mL         Stopping of antibiotics            Stopping of antibiotics           strongly encouraged.               strongly encouraged.        ----------------------------     ------------------------------           PCT level decrease by               PCT < 0.25 ng/mL           >= 80% from peak PCT           OR PCT 0.25 - 0.5 ng/mL          Stopping of antibiotics                                                 encouraged.         Stopping of antibiotics                encouraged.        ----------------------------     ------------------------------           PCT level decrease by              PCT >= 0.25 ng/mL           < 80% from peak PCT            AND PCT >= 0.5 ng/mL            Continuing antibiotics                                                  encouraged.           Continuing antibiotics                encouraged.        ----------------------------     ------------------------------         PCT level increase compared          PCT > 0.5 ng/mL             with peak PCT AND              PCT >= 0.5 ng/mL             Escalation of antibiotics  strongly encouraged.          Escalation of antibiotics            strongly encouraged.  CBC     Status: Abnormal   Collection Time    11/14/12  4:00 AM      Result Value Range   WBC 8.6  4.0 - 10.5 K/uL   RBC 2.47 (*) 3.87 - 5.11 MIL/uL   Hemoglobin 7.6 (*) 12.0 - 15.0 g/dL   HCT 96.0 (*) 45.4 - 09.8 %   MCV 96.0  78.0 - 100.0 fL   MCH 30.8  26.0 - 34.0 pg   MCHC 32.1  30.0 - 36.0 g/dL   RDW 11.9 (*) 14.7 - 82.9 %   Platelets 114 (*) 150 - 400 K/uL   Comment: SPECIMEN CHECKED FOR CLOTS     REPEATED TO VERIFY     PLATELET COUNT CONFIRMED BY SMEAR  PROTIME-INR     Status: Abnormal   Collection Time    11/14/12  4:00 AM      Result Value Range   Prothrombin Time 22.3 (*) 11.6 - 15.2 seconds   INR 2.05 (*) 0.00 - 1.49  COMPREHENSIVE METABOLIC PANEL     Status: Abnormal   Collection Time    11/14/12  4:00 AM      Result Value Range   Sodium 135  135 - 145 mEq/L   Potassium 3.9  3.5 - 5.1 mEq/L   Chloride 104  96 - 112 mEq/L   CO2 24  19 - 32 mEq/L   Glucose, Bld 114 (*) 70 - 99 mg/dL   BUN 25 (*) 6 - 23 mg/dL   Creatinine, Ser 5.62 (*) 0.50 - 1.10 mg/dL   Calcium 6.7 (*) 8.4 - 10.5 mg/dL   Total Protein 5.1 (*) 6.0 - 8.3 g/dL   Albumin 1.7 (*) 3.5 - 5.2 g/dL   AST 52 (*) 0 - 37 U/L   ALT 19  0 - 35 U/L   Alkaline Phosphatase 81  39 - 117  U/L   Total Bilirubin 4.0 (*) 0.3 - 1.2 mg/dL   GFR calc non Af Amer 26 (*) >90 mL/min   GFR calc Af Amer 30 (*) >90 mL/min   Comment:            The eGFR has been calculated     using the CKD EPI equation.     This calculation has not been     validated in all clinical     situations.     eGFR's persistently     <90 mL/min signify     possible Chronic Kidney Disease.    Imaging / Studies: Dg Chest Port 1 View  11/14/2012  *RADIOLOGY REPORT*  Clinical Data: Intubated patient.  PORTABLE CHEST - 1 VIEW  Comparison: Chest 11/12/2012.  Findings: Support tubes and lines are unchanged.  Left basilar airspace disease persists.  Right lung appears clear.  No pneumothorax.  Heart size upper normal.  IMPRESSION: No change in left basilar airspace disease.  No new abnormality.   Original Report Authenticated By: Holley Dexter, M.D.    Dg Chest Port 1 View  11/12/2012  *RADIOLOGY REPORT*  Clinical Data: Evaluate central line placement.  PORTABLE CHEST - 1 VIEW  Comparison: 11/12/2012  Findings: Endotracheal tube is 3.8 cm above the carina.  Left jugular central line tip is in the lower SVC region.  No evidence for a pneumothorax.  Persistent left basilar densities could represent  atelectasis and cannot exclude pleural fluid.  The nasogastric tube extends into the abdomen.  Stable appearance of the heart.  IMPRESSION: Central line tip in the lower SVC region.  Negative for a pneumothorax.  Stable basilar densities.   Original Report Authenticated By: Richarda Overlie, M.D.    Dg Chest Port 1 View  11/12/2012  *RADIOLOGY REPORT*  Clinical Data: Perforated jejunum.  Peritonitis.  PORTABLE CHEST - 1 VIEW 5:34 p.m.  Comparison: 11/12/2012 at 11:35 a.m.  Findings: Endotracheal tube is in good position 3 cm above the carina.  NG tube tip is in the stomach.  Heart size and vascularity are normal.  There is slight atelectasis in the left upper lobe medially.  Scarring at the left base.  Dense calcification in the mitral  valve annulus.  IMPRESSION: New atelectasis in the left upper lobe medially.   Original Report Authenticated By: Francene Boyers, M.D.    Dg Chest Port 1 View  11/12/2012  *RADIOLOGY REPORT*  Clinical Data: Cough and shortness of breath.  Fever.  Nausea and vomiting.  PORTABLE CHEST - 1 VIEW  Comparison: Chest x-rays dated 10/05/2012 and 01/20/2012  Findings: There is suggestion of free air under the right hemidiaphragm.  I recommend a left lateral decubitus view of the abdomen for further evaluation of this possibility.  There is chronic cardiomegaly with scarring at the left lung base laterally.  Dense calcification in the mitral valve annulus.  There is slight pulmonary vascular congestion.  No infiltrates.  IMPRESSION:  1.  Possible free air under the right hemidiaphragm.  Left lateral decubitus view of the abdomen recommended for further evaluation. 2.  Pulmonary vascular congestion. 3.  No acute infiltrates.  Critical Value/emergent results were called by telephone at the time of interpretation on 11/12/2012 at 11:48 a.m. to Mercy Moore, RN, who verbally acknowledged these results.   Original Report Authenticated By: Francene Boyers, M.D.    Dg Abd Decub  11/12/2012  *RADIOLOGY REPORT*  Clinical Data: Abdominal pain and nausea.  Question free intraperitoneal air.  ABDOMEN - 1 VIEW DECUBITUS  Comparison: Plain films abdomen 11/11/2012.  Findings: There is free intraperitoneal air.  IMPRESSION: Study is positive for free intraperitoneal air consistent with bowel perforation.  Critical Value/emergent results were called by telephone at the time of interpretation on 11/12/2012 at 12:53 p.m. to the patient's nurse, Bernette Redbird, who verbally acknowledged these results.   Original Report Authenticated By: Holley Dexter, M.D.     Medications / Allergies: per chart  Antibiotics: Anti-infectives   Start     Dose/Rate Route Frequency Ordered Stop   11/13/12 1500  ertapenem (INVANZ) 1 g in sodium chloride 0.9 % 50  mL IVPB     1 g 100 mL/hr over 30 Minutes Intravenous Every 24 hours 11/12/12 1758     11/13/12 1000  Levofloxacin (LEVAQUIN) IVPB 250 mg  Status:  Discontinued     250 mg 50 mL/hr over 60 Minutes Intravenous Every 24 hours 11/12/12 0953 11/12/12 1259   11/12/12 2200  ciprofloxacin (CIPRO) IVPB 400 mg  Status:  Discontinued     400 mg 200 mL/hr over 60 Minutes Intravenous Every 12 hours 11/12/12 1308 11/12/12 1747   11/12/12 1830  aztreonam (AZACTAM) 1 g in dextrose 5 % 50 mL IVPB  Status:  Discontinued     1 g 100 mL/hr over 30 Minutes Intravenous Every 8 hours 11/12/12 1811 11/13/12 0931   11/12/12 1446  ertapenem (INVANZ) 1 g in sodium chloride 0.9 % 50 mL  IVPB  Status:  Discontinued    Comments:  stat   1 g 100 mL/hr over 30 Minutes Intravenous 60 min pre-op 11/12/12 1444 11/12/12 1700   11/12/12 1400  metroNIDAZOLE (FLAGYL) IVPB 500 mg  Status:  Discontinued     500 mg 100 mL/hr over 60 Minutes Intravenous Every 8 hours 11/12/12 1308 11/13/12 0911   11/12/12 1000  levofloxacin (LEVAQUIN) IVPB 500 mg     500 mg 100 mL/hr over 60 Minutes Intravenous  Once 11/12/12 0942 11/12/12 1157

## 2012-11-14 NOTE — Addendum Note (Signed)
Addendum created 11/14/12 1137 by Mechele Dawley, CRNA   Modules edited: Anesthesia Medication Administration

## 2012-11-14 NOTE — Therapy (Signed)
Pt transported to OR

## 2012-11-14 NOTE — Progress Notes (Signed)
PULMONARY  / CRITICAL CARE MEDICINE  Name: [name] [name] MRN: [id number] DOB: 1946-05-09    ADMISSION DATE:  11/11/2012 CONSULTATION DATE:  11/12/2012  REFERRING MD :  CCS PRIMARY SERVICE:  PCCM  CHIEF COMPLAINT:  Post op respiratory failure  BRIEF PATIENT DESCRIPTION: 67 yo with alcoholic liver disease was admitted on 5/7 with abdominal pain and was found on 5/8 to have a small bowel perforation.  She was taken to the OR and returned to the ICU with an open wound and mechanically ventilated.   SIGNIFICANT EVENTS / STUDIES:  5/7  Admitted with abdominal pain, nausea, vomiting 5/7  OR >>> Perforated viscus, peritonitis, lysis of adhesions, small bowel repair  LINES / TUBES: OETT 5/8 >>> OGT 5/8 >>> Foley 5/8 >>> R rad A-line 5/8 >>> L IJ CVL 5/8 >>  CULTURES: 5/8 Blood >>> 5/7 Urine >>>neg   ANTIBIOTICS: Aztreonam 5/8 >>>5/9 Flagyl 5/8 >>>5/9 Levaquin 5/8 x 1 Cipro 5/8 x 1 ertapenem 5/9>>>  Interval events: - fio2 increased am 5/10 for low pao2 on ABG, currently 60% Remains on low dose norepi Going back to OR this am  VITAL SIGNS: Temp:  [96.3 F (35.7 C)-98.9 F (37.2 C)] 96.3 F (35.7 C) (05/10 0845) Pulse Rate:  [77-102] 83 (05/10 0845) Resp:  [17-24] 17 (05/10 0845) BP: (95-119)/(54-66) 95/54 mmHg (05/09 1654) SpO2:  [93 %-98 %] 94 % (05/10 0800) Arterial Line BP: (92-135)/(51-78) 114/60 mmHg (05/10 0845) FiO2 (%):  [40 %-60 %] 60 % (05/10 0408)  HEMODYNAMICS: CVP:  [16 mmHg-22 mmHg] 16 mmHg VENTILATOR SETTINGS: Vent Mode:  [-] PRVC FiO2 (%):  [40 %-60 %] 60 % Set Rate:  [18 bmp] 18 bmp Vt Set:  [450 mL] 450 mL PEEP:  [5 cmH20] 5 cmH20 Plateau Pressure:  [17 cmH20-25 cmH20] 20 cmH20 INTAKE / OUTPUT: Intake/Output     05/09 0701 - 05/10 0700 05/10 0701 - 05/11 0700   I.V. (mL/kg) 2209.2 (21) 115.6 (1.1)   Blood 936 324   IV Piggyback 100    Total Intake(mL/kg) 3245.2 (30.8) 439.6 (4.2)   Urine (mL/kg/hr) 630 (0.2) 45 (0.2)   Emesis/NG output  150 (0.1)    Drains 1775 (0.7) 100 (0.5)   Other     Blood     Total Output 2555 145   Net +690.2 +294.6          PHYSICAL EXAMINATION: General:  Sedated on vent HEENT: NCAT, sleral icterus noted, PERRL, ETT, OG tubes in place PULM: scattered rhonchi  CV: RRR, no mgr AB: central wound vac in place, no bowel sounds Ext: edema in feet, SCD's in place Neuro: arouses to voice, does not follow commands, moves all four ext   LABS:  Recent Labs Lab 11/12/12 1752 11/13/12 0415 11/14/12 0359  PHART 7.293* 7.365 7.406  PCO2ART 43.5 39.4 37.5  PO2ART 261.0* 94.0 73.0*  HCO3 20.4 22.0 23.1  TCO2 19.6 20.8 22.1  O2SAT 99.3 97.3 94.6     Recent Labs Lab 11/13/12 0430 11/13/12 1045 11/14/12 0400  NA 136 135 135  K 4.0 3.9 3.9  CL 104 105 104  CO2 23 24 24   BUN 18 19 25*  CREATININE 2.45* 2.37* 1.95*  GLUCOSE 122* 121* 114*    Recent Labs Lab 11/13/12 1045 11/13/12 1430 11/14/12 0400  HGB 7.7* 7.9* 7.6*  HCT 24.7* 24.5* 23.7*  WBC 12.7* 10.4 8.6  PLT 138* 126* 114*   Lab Results  Component Value Date   INR 2.05* 11/14/2012  INR 1.90* 11/13/2012   INR 1.92* 11/13/2012    Recent Labs Lab 11/12/12 0750 11/12/12 0817 11/12/12 0845 11/12/12 0923 11/12/12 1300  GLUCAP 57* 59* 63* 120* 128*   CXR:  5/8 >>> low vol. LLL ATX  ASSESSMENT / PLAN:  PULMONARY A:  Acute post operative respiratory failure.  At risk for TRALI (multiple transfusions). >CXR w/ LLL ATX vs infiltrate P:   Full mechanical support Daily SBT, do not extubate as open abdomen / back to OR today (and probably subsequent days) Trend ABG / CXR  CARDIOVASCULAR A: Sepsis / septic shock secondary to peritonitis. Remains on high dose pressors.  P:  Goal MAP>60, wean pressors as able CVP goal >10 Start solucortef 5/9  RENAL A:  AKI.  Hypovolemia. Scr a little worse 5/9 P:   Goal CVP>10 Cont IVFs Renal dose meds   GASTROINTESTINAL A:  Alcoholic cirrhosis.  Acute alcoholic hepatitis.   Perforated viscus in jejunem s/p repair 5/8 P:   CCS following Back to OR today NPO Protonix for GI Px  HEMATOLOGIC A:  Coagulopathy. Significant intraoperative bleeding controlled with platelets and plasma. P:  Vit K; FFP given pre-op, and follow INR Cont PAS  INFECTIOUS  Recent Labs Lab 11/12/12 1757 11/13/12 0430 11/14/12 0400  PROCALCITON 2.51 2.78 1.54   A:  Peritonitis.  PCN / Cephalosporin allergy.  P:   Cultures and antibiotics as per flowsheet above Trend PCT  ENDOCRINE  A:  Hypoglycemia. P:   IVF as above  NEUROLOGIC A:  Acute encephalopathy.  Alcohol abuse. Post op pain. At risk for alcohol withdrawal. P:   Goal RASS  -1 to -2 Thiamin / Folate Fentanyl / Versed   TODAY'S SUMMARY: 67 y/o female with alcoholic cirrhosis admitted 5/7 and taken to the OR on 5/8 for a perforated jejunem.  Intraoperatively she had significant bleeding controlled with plasma/plt transfusions.  Will remain intubated until she goes back to the OR for closure. Still in shock. Cover w/ ertapenem   I have personally obtained a history, examined the patient, evaluated laboratory and imaging results, formulated the assessment and plan and placed orders.  CRITICAL CARE:  The patient is critically ill with multiple organ systems failure and requires high complexity decision making for assessment and support, frequent evaluation and titration of therapies, application of advanced monitoring technologies and extensive interpretation of multiple databases. Critical Care Time devoted to patient care services described in this note is 35 minutes.   Levy Pupa, MD, PhD 11/14/2012, 9:01 AM [facility] [phone] or if no answer [phone]

## 2012-11-14 NOTE — Transfer of Care (Signed)
Immediate Anesthesia Transfer of Care Note  Patient: [name] [name]  Procedure(s) Performed: Procedure(s): Washout Abdominal Wound, debridement of Fascia, Partial Closure  Patient Location: PACU  Anesthesia Type: General  Level of Consciousness: sedated, intubated per anesthesia plan  Airway & Oxygen Therapy: Ventilated with ETT  Post-op Assessment: Report given to PACU RN and Post -op Vital signs reviewed and stable  Post vital signs: Reviewed and stable  Complications: No apparent anesthesia complications

## 2012-11-15 ENCOUNTER — Inpatient Hospital Stay (HOSPITAL_COMMUNITY): Payer: BC Managed Care – PPO

## 2012-11-15 LAB — BLOOD GAS, ARTERIAL
MECHVT: 500 mL
O2 Saturation: 96.3 %
PEEP: 5 cmH2O
Patient temperature: 98.6
RATE: 14 resp/min
pO2, Arterial: 85.6 mmHg (ref 80.0–100.0)

## 2012-11-15 LAB — BASIC METABOLIC PANEL
CO2: 24 mEq/L (ref 19–32)
Calcium: 7.4 mg/dL — ABNORMAL LOW (ref 8.4–10.5)
Glucose, Bld: 128 mg/dL — ABNORMAL HIGH (ref 70–99)
Sodium: 137 mEq/L (ref 135–145)

## 2012-11-15 LAB — GLUCOSE, CAPILLARY
Glucose-Capillary: 127 mg/dL — ABNORMAL HIGH (ref 70–99)
Glucose-Capillary: 123 mg/dL — ABNORMAL HIGH (ref 70–99)

## 2012-11-15 LAB — CBC
HCT: 28.5 % — ABNORMAL LOW (ref 36.0–46.0)
Hemoglobin: 9.1 g/dL — ABNORMAL LOW (ref 12.0–15.0)
MCHC: 31.9 g/dL (ref 30.0–36.0)

## 2012-11-15 LAB — PHOSPHORUS: Phosphorus: 3.8 mg/dL (ref 2.3–4.6)

## 2012-11-15 MED ORDER — SODIUM CHLORIDE 0.9 % IV SOLN
INTRAVENOUS | Status: DC
  Administered 2012-11-15 – 2012-11-21 (×4): via INTRAVENOUS

## 2012-11-15 MED ORDER — INSULIN ASPART 100 UNIT/ML ~~LOC~~ SOLN
0.0000 [IU] | SUBCUTANEOUS | Status: DC
  Administered 2012-11-15 – 2012-11-16 (×6): 2 [IU] via SUBCUTANEOUS
  Administered 2012-11-16 – 2012-11-17 (×4): 3 [IU] via SUBCUTANEOUS

## 2012-11-15 NOTE — Progress Notes (Signed)
CARE MANAGEMENT NOTE 11/15/2012  Patient:  [name] [name], [name] [name]   Account Number:  [id number]  Date Initiated:  11/13/2012  Documentation initiated by:  Tallin Hart  Subjective/Objective Assessment:   pt with perforated bowel, requiring surgical intervention, unable to support resp on vent post op     Action/Plan:   tbd based on progress   Anticipated DC Date:  11/18/2012   Anticipated DC Plan:  HOME/SELF CARE  In-house referral  NA      DC Planning Services  NA      [facility] Choice  NA   Choice offered to / List presented to:  NA   DME arranged  NA      DME agency  NA     HH arranged  NA      HH agency  NA   Status of service:  In process, will continue to follow Medicare Important Message given?  NA - LOS <3 / Initial given by admissions (If response is "NO", the following Medicare IM given date fields will be blank) Date Medicare IM given:   Date Additional Medicare IM given:    Discharge Disposition:    Per UR Regulation:  Reviewed for med. necessity/level of care/duration of stay  If discussed at Long Length of Stay Meetings, dates discussed:    Comments:  [id number]/Boomer Winders,RN,BSN,CCM: Patient returned to or on [id number], post-op resp failure, open abd wound, on vent-intubated. [id number]/GNFAOZ Earlene Plater, RN, BSN, CCM:  CHART REVIEWED AND UPDATED.  Next chart review due on [id number]. NO DISCHARGE NEEDS PRESENT AT THIS TIME. CASE MANAGEMENT [phone]

## 2012-11-15 NOTE — Progress Notes (Signed)
[name] [name] [id number] [birth year]  CARE TEAM:  PCP: Judie Petit, MD  Outpatient Care Team: Patient Care Team: Lindley Magnus, MD as PCP - General  Inpatient Treatment Team: Treatment Team: Attending Provider: Lonia Farber, MD; Consulting Physician: Bishop Limbo, MD; Rounding Team: Md Pccm, MD; Registered Nurse: Gerald Stabs, RN; Registered Nurse: Fenton Foy, RN   Subjective:  Weaned off pressors Weaned vent Started low rate/trophic TFs.  Residuals <300  Objective:  Vital signs:  Filed Vitals:   11/15/12 0400 11/15/12 0500 11/15/12 0600 11/15/12 0700  BP:      Pulse: 66 73 73 74  Temp: 96.8 F (36 C)     TempSrc: Axillary     Resp: 14 14 14 14   Height:      Weight:      SpO2: 96% 95% 95% 96%    Last BM Date: 11/10/12  Intake/Output   Yesterday:  05/10 0701 - 05/11 0700 In: 5818.5 [I.V.:3325.5; Blood:1024; NG/GT:420; IV Piggyback:1049] Out: 834 [Urine:652; Drains:107; Blood:25] This shift:     Bowel function:  Flatus: n  BM: n  Drain: n/a  Physical Exam:  General: Pt intubated/sedated but stable  Eyes: PERRL, normal EOM. Sclera clear. No icterus  Neuro: CN II-XII intact w/o focal sensory/motor deficits.  Lymph: No head/neck/groin lymphadenopathy  Psych: No delerium/psychosis/paranoia  HENT: Normocephalic, Mucus membranes moist. No thrush  Neck: Supple, No tracheal deviation. ETT & OGT in place  Chest: No chest wall pain w good excursion. Coarse BS B  CV: Pulses intact. Regular rhythm  MS: Normal AROM mjr joints. No obvious deformity  Abdomen: Soft. Obese. Mildly distended - unchanged.  Wound vac in place. Minimal serosanguinous return in vac. No incarcerated hernias.  Ext: SCDs BLE. No cyanosis  Skin: No petechiae / purpura. 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 female  1 Day Post-Op  Procedure(s): Washout Abdominal Wound, debridement of Fascia, Partial Closure  Stabilizing but a long ways to go  Plan:  -keep TFs at 30/hr only until +BM.  Then slowly adv to goal.  Hold TFs if clinically worsening or inc residuals per TF protocol  -complete ABx 3 days post-op in light of not massive contamination on f/u ex lap.  -supportive Tx for liver failure  -ARF improving a hopeful sign   -wound vac changes starting tomorrow - d/w RNs that the fascia is closed  -consider diuresis since off pressors  -VTE prophylaxis- SCDs, etc  -coagulopathic but no bleeding & Hgb stable - follow.  FFP PRN.  D/w [facility] ICU RN  Ardeth Sportsman, M.D., F.A.C.S. Gastrointestinal and [facility]imally In[facility] [address], [address] [place], [place] [address] [phone] Main / Paging   11/15/2012   Results:   Labs: Results for orders placed  during the hospital encounter of 11/11/12 (from the past 48 hour(s))  PROTIME-INR     Status: Abnormal   Collection Time    11/13/12 10:45 AM      Result Value Range   Prothrombin Time 23.2 (*) 11.6 - 15.2 seconds   INR 2.16 (*) 0.00 - 1.49  BASIC METABOLIC PANEL     Status: Abnormal   Collection Time    11/13/12 10:45 AM      Result Value Range   Sodium 135  135 - 145 mEq/L   Potassium 3.9  3.5 - 5.1 mEq/L   Chloride 105  96 - 112 mEq/L   CO2 24  19 - 32 mEq/L   Glucose, Bld 121 (*) 70 - 99 mg/dL   BUN 19  6 - 23 mg/dL    Creatinine, Ser 4.78 (*) 0.50 - 1.10 mg/dL   Calcium 6.4 (*) 8.4 - 10.5 mg/dL   Comment: CRITICAL RESULT CALLED TO, READ BACK BY AND VERIFIED WITH:     EUBANKS,M. RN 1141 11/13/12, NONAN,J.   GFR calc non Af Amer 21 (*) >90 mL/min   GFR calc Af Amer 24 (*) >90 mL/min   Comment:            The eGFR has been calculated     using the CKD EPI equation.     This calculation has not been     validated in all clinical     situations.     eGFR's persistently     <90 mL/min signify     possible Chronic Kidney Disease.  CBC WITH DIFFERENTIAL     Status: Abnormal   Collection Time    11/13/12 10:45 AM      Result Value Range   WBC 12.7 (*) 4.0 - 10.5 K/uL   RBC 2.56 (*) 3.87 - 5.11 MIL/uL   Hemoglobin 7.7 (*) 12.0 - 15.0 g/dL   HCT 29.5 (*) 62.1 - 30.8 %   MCV 96.5  78.0 - 100.0 fL   MCH 30.1  26.0 - 34.0 pg   MCHC 31.2  30.0 - 36.0 g/dL   RDW 65.7 (*) 84.6 - 96.2 %   Platelets 138 (*) 150 - 400 K/uL   Neutrophils Relative 86 (*) 43 - 77 %   Comment: CORRECTED ON 05/09 AT 1118: PREVIOUSLY REPORTED AS 85   Neutro Abs 11.0 (*) 1.7 - 7.7 K/uL   Comment: CORRECTED ON 05/09 AT 1118: PREVIOUSLY REPORTED AS 10.8   Lymphocytes Relative 5 (*) 12 - 46 %   Lymphs Abs 0.6 (*) 0.7 - 4.0 K/uL   Comment: CORRECTED ON 05/09 AT 1118: PREVIOUSLY REPORTED AS 0.7   Monocytes Relative 9  3 - 12 %   Monocytes Absolute 1.1 (*) 0.1 - 1.0 K/uL   Comment: CORRECTED ON 05/09 AT 1118: PREVIOUSLY REPORTED AS 1.2   Eosinophils Relative 0  0 - 5 %   Eosinophils Absolute 0.0  0.0 - 0.7 K/uL   Basophils Relative 0  0 - 1 %   Basophils Absolute 0.0  0.0 - 0.1 K/uL   WBC Morphology MILD LEFT SHIFT (1-5% METAS, OCC MYELO, OCC BANDS)     Comment: DOHLE BODIES   RBC Morphology POLYCHROMASIA PRESENT    CORTISOL     Status: None   Collection Time    11/13/12 10:45 AM      Result Value Range   Cortisol, Plasma 69.9     Comment: (NOTE)     AM:  4.3 - 22.4 ug/dL     PM:  3.1 - 95.6 ug/dL  CBC     Status: Abnormal    Collection Time    11/13/12  2:30 PM      Result Value Range   WBC 10.4  4.0 - 10.5 K/uL   RBC 2.55 (*) 3.87 - 5.11 MIL/uL   Hemoglobin 7.9 (*) 12.0 - 15.0 g/dL   HCT 21.3 (*) 08.6 - 57.8 %   MCV 96.1  78.0 - 100.0 fL   MCH 31.0  26.0 - 34.0 pg   MCHC 32.2  30.0 - 36.0 g/dL   RDW 46.9 (*) 62.9 - 52.8 %   Platelets 126 (*) 150 - 400 K/uL  PROTIME-INR     Status: Abnormal   Collection Time    11/13/12  2:30 PM      Result Value Range   Prothrombin Time 21.2 (*) 11.6 - 15.2 seconds   INR 1.92 (*) 0.00 - 1.49  PROTIME-INR     Status: Abnormal   Collection Time    11/13/12  6:15 PM      Result Value Range   Prothrombin Time 21.1 (*) 11.6 - 15.2 seconds   INR 1.90 (*) 0.00 - 1.49  BLOOD GAS, ARTERIAL     Status: Abnormal   Collection Time    11/14/12  3:59 AM      Result Value Range   FIO2 0.40     Delivery systems VENTILATOR     Mode PRESSURE REGULATED VOLUME CONTROL     VT 0.450     Rate 18     Peep/cpap 5.0     pH, Arterial 7.406  7.350 - 7.450   pCO2 arterial 37.5  35.0 - 45.0 mmHg   pO2, Arterial 73.0 (*) 80.0 - 100.0 mmHg   Bicarbonate 23.1  20.0 - 24.0 mEq/L   TCO2 22.1  0 - 100 mmol/L   Acid-base deficit 0.9  0.0 - 2.0 mmol/L   O2 Saturation 94.6     Patient temperature 98.6     Collection site A-LINE     Drawn by [phone]     Sample type ARTERIAL DRAW    PROCALCITONIN     Status: None   Collection Time    11/14/12  4:00 AM      Result Value Range   Procalcitonin 1.54     Comment:            Interpretation:     PCT > 0.5 ng/mL and <= 2 ng/mL:     Systemic infection (sepsis) is possible,     but other conditions are known to elevate     PCT as well.     (NOTE)             ICU PCT Algorithm               Non ICU PCT Algorithm        ----------------------------     ------------------------------             PCT < 0.25 ng/mL                 PCT < 0.1 ng/mL         Stopping of antibiotics            Stopping of antibiotics           strongly encouraged.  strongly encouraged.        ----------------------------     ------------------------------           PCT level decrease by               PCT < 0.25 ng/mL           >= 80% from peak PCT           OR PCT 0.25 - 0.5 ng/mL          Stopping of antibiotics                                                 encouraged.         Stopping of antibiotics               encouraged.        ----------------------------     ------------------------------           PCT level decrease by              PCT >= 0.25 ng/mL           < 80% from peak PCT            AND PCT >= 0.5 ng/mL            Continuing antibiotics                                                  encouraged.           Continuing antibiotics                encouraged.        ----------------------------     ------------------------------         PCT level increase compared          PCT > 0.5 ng/mL             with peak PCT AND              PCT >= 0.5 ng/mL             Escalation of antibiotics                                              strongly encouraged.          Escalation of antibiotics            strongly encouraged.  CBC     Status: Abnormal   Collection Time    11/14/12  4:00 AM      Result Value Range   WBC 8.6  4.0 - 10.5 K/uL   RBC 2.47 (*) 3.87 - 5.11 MIL/uL   Hemoglobin 7.6 (*) 12.0 - 15.0 g/dL   HCT 09.8 (*) 11.9 - 14.7 %   MCV 96.0  78.0 - 100.0 fL   MCH 30.8  26.0 - 34.0 pg   MCHC 32.1  30.0 - 36.0 g/dL   RDW 82.9 (*) 56.2 - 13.0 %   Platelets 114 (*) 150 - 400 K/uL   Comment:  SPECIMEN CHECKED FOR CLOTS     REPEATED TO VERIFY     PLATELET COUNT CONFIRMED BY SMEAR  PROTIME-INR     Status: Abnormal   Collection Time    11/14/12  4:00 AM      Result Value Range   Prothrombin Time 22.3 (*) 11.6 - 15.2 seconds   INR 2.05 (*) 0.00 - 1.49  COMPREHENSIVE METABOLIC PANEL     Status: Abnormal   Collection Time    11/14/12  4:00 AM      Result Value Range   Sodium 135  135 - 145 mEq/L   Potassium 3.9  3.5 - 5.1  mEq/L   Chloride 104  96 - 112 mEq/L   CO2 24  19 - 32 mEq/L   Glucose, Bld 114 (*) 70 - 99 mg/dL   BUN 25 (*) 6 - 23 mg/dL   Creatinine, Ser 8.11 (*) 0.50 - 1.10 mg/dL   Calcium 6.7 (*) 8.4 - 10.5 mg/dL   Total Protein 5.1 (*) 6.0 - 8.3 g/dL   Albumin 1.7 (*) 3.5 - 5.2 g/dL   AST 52 (*) 0 - 37 U/L   ALT 19  0 - 35 U/L   Alkaline Phosphatase 81  39 - 117 U/L   Total Bilirubin 4.0 (*) 0.3 - 1.2 mg/dL   GFR calc non Af Amer 26 (*) >90 mL/min   GFR calc Af Amer 30 (*) >90 mL/min   Comment:            The eGFR has been calculated     using the CKD EPI equation.     This calculation has not been     validated in all clinical     situations.     eGFR's persistently     <90 mL/min signify     possible Chronic Kidney Disease.  PREPARE FRESH FROZEN PLASMA     Status: None   Collection Time    11/14/12  6:30 AM      Result Value Range   Unit Number [id number]     Blood Component Type THAWED PLASMA     Unit division 00     Status of Unit ISSUED     Transfusion Status OK TO TRANSFUSE     Unit Number [id number]     Blood Component Type THAWED PLASMA     Unit division 00     Status of Unit ISSUED     Transfusion Status OK TO TRANSFUSE    BASIC METABOLIC PANEL     Status: Abnormal   Collection Time    11/15/12  4:31 AM      Result Value Range   Sodium 137  135 - 145 mEq/L   Potassium 4.3  3.5 - 5.1 mEq/L   Chloride 106  96 - 112 mEq/L   CO2 24  19 - 32 mEq/L   Glucose, Bld 128 (*) 70 - 99 mg/dL   BUN 27 (*) 6 - 23 mg/dL   Creatinine, Ser 8.41 (*) 0.50 - 1.10 mg/dL   Calcium 7.4 (*) 8.4 - 10.5 mg/dL   GFR calc non Af Amer 37 (*) >90 mL/min   GFR calc Af Amer 43 (*) >90 mL/min   Comment:            The eGFR has been calculated     using the CKD EPI equation.     This calculation has not been     validated in all clinical  situations.     eGFR's persistently     <90 mL/min signify     possible Chronic Kidney Disease.  MAGNESIUM     Status: None   Collection Time     11/15/12  4:31 AM      Result Value Range   Magnesium 1.5  1.5 - 2.5 mg/dL  PHOSPHORUS     Status: None   Collection Time    11/15/12  4:31 AM      Result Value Range   Phosphorus 3.8  2.3 - 4.6 mg/dL  CBC     Status: Abnormal   Collection Time    11/15/12  4:31 AM      Result Value Range   WBC 8.3  4.0 - 10.5 K/uL   RBC 3.07 (*) 3.87 - 5.11 MIL/uL   Hemoglobin 9.1 (*) 12.0 - 15.0 g/dL   HCT 40.9 (*) 81.1 - 91.4 %   MCV 92.8  78.0 - 100.0 fL   MCH 29.6  26.0 - 34.0 pg   MCHC 31.9  30.0 - 36.0 g/dL   RDW 78.2 (*) 95.6 - 21.3 %   Platelets 113 (*) 150 - 400 K/uL   Comment: REPEATED TO VERIFY     SPECIMEN CHECKED FOR CLOTS     CONSISTENT WITH PREVIOUS RESULT    Imaging / Studies: Dg Chest Port 1 View  11/15/2012  *RADIOLOGY REPORT*  Clinical Data: Intubated patient.  PORTABLE CHEST - 1 VIEW  Comparison: The tip in the portable chest 11/14/2012.  Findings: Support tubes and lines are unchanged.  There has been some increase in right basilar atelectasis.  Small left effusion and basilar airspace disease are unchanged.  No pneumothorax.  IMPRESSION:  1.  Support apparatus unchanged and in good position. 2.  Increased right basilar atelectasis. 3.  No marked change in a small left effusion and basilar airspace disease.   Original Report Authenticated By: Holley Dexter, M.D.    Dg Chest Port 1 View  11/14/2012  *RADIOLOGY REPORT*  Clinical Data: Intubated patient.  PORTABLE CHEST - 1 VIEW  Comparison: Chest 11/12/2012.  Findings: Support tubes and lines are unchanged.  Left basilar airspace disease persists.  Right lung appears clear.  No pneumothorax.  Heart size upper normal.  IMPRESSION: No change in left basilar airspace disease.  No new abnormality.   Original Report Authenticated By: Holley Dexter, M.D.     Medications / Allergies: per chart  Antibiotics: Anti-infectives   Start     Dose/Rate Route Frequency Ordered Stop   11/13/12 1500  ertapenem (INVANZ) 1 g in sodium  chloride 0.9 % 50 mL IVPB     1 g 100 mL/hr over 30 Minutes Intravenous Every 24 hours 11/12/12 1758     11/13/12 1000  Levofloxacin (LEVAQUIN) IVPB 250 mg  Status:  Discontinued     250 mg 50 mL/hr over 60 Minutes Intravenous Every 24 hours 11/12/12 0953 11/12/12 1259   11/12/12 2200  ciprofloxacin (CIPRO) IVPB 400 mg  Status:  Discontinued     400 mg 200 mL/hr over 60 Minutes Intravenous Every 12 hours 11/12/12 1308 11/12/12 1747   11/12/12 1830  aztreonam (AZACTAM) 1 g in dextrose 5 % 50 mL IVPB  Status:  Discontinued     1 g 100 mL/hr over 30 Minutes Intravenous Every 8 hours 11/12/12 1811 11/13/12 0931   11/12/12 1446  ertapenem (INVANZ) 1 g in sodium chloride 0.9 % 50 mL IVPB  Status:  Discontinued  Comments:  stat   1 g 100 mL/hr over 30 Minutes Intravenous 60 min pre-op 11/12/12 1444 11/12/12 1700   11/12/12 1400  metroNIDAZOLE (FLAGYL) IVPB 500 mg  Status:  Discontinued     500 mg 100 mL/hr over 60 Minutes Intravenous Every 8 hours 11/12/12 1308 11/13/12 0911   11/12/12 1000  levofloxacin (LEVAQUIN) IVPB 500 mg     500 mg 100 mL/hr over 60 Minutes Intravenous  Once 11/12/12 0942 11/12/12 1157

## 2012-11-15 NOTE — Progress Notes (Signed)
PULMONARY  / CRITICAL CARE MEDICINE  Name: [name] [name] MRN: [id number] DOB: [birth year]    ADMISSION DATE:  11/11/2012 CONSULTATION DATE:  11/12/2012  REFERRING MD :  CCS PRIMARY SERVICE:  PCCM  CHIEF COMPLAINT:  Post op respiratory failure  BRIEF PATIENT DESCRIPTION: 67 yo with alcoholic liver disease was admitted on 5/7 with abdominal pain and was found on 5/8 to have a small bowel perforation.  She was taken to the OR and returned to the ICU with an open wound and mechanically ventilated.   SIGNIFICANT EVENTS / STUDIES:  5/7  Admitted with abdominal pain, nausea, vomiting 5/7  OR >>> Perforated viscus, peritonitis, lysis of adhesions, small bowel repair  LINES / TUBES: OETT 5/8 >>> OGT 5/8 >>> Foley 5/8 >>> R rad A-line 5/8 >>> L IJ CVL 5/8 >>  CULTURES: 5/8 Blood >>> 5/7 Urine >>>neg   ANTIBIOTICS: Aztreonam 5/8 >>>5/9 Flagyl 5/8 >>>5/9 Levaquin 5/8 x 1 Cipro 5/8 x 1 ertapenem 5/9>>>  Interval events: Pressors weaned to off CVP > 20  VITAL SIGNS: Temp:  [96.2 F (35.7 C)-97.8 F (36.6 C)] 97.6 F (36.4 C) (05/11 0800) Pulse Rate:  [66-100] 73 (05/11 0800) Resp:  [13-21] 14 (05/11 0800) BP: (96-123)/(68-74) 123/74 mmHg (05/10 2003) SpO2:  [95 %-98 %] 95 % (05/11 0800) Arterial Line BP: (98-143)/(57-79) 102/60 mmHg (05/11 0800) FiO2 (%):  [40 %] 40 % (05/11 0830) Weight:  [107.9 kg (237 lb 14 oz)] 107.9 kg (237 lb 14 oz) (05/11 0100)  HEMODYNAMICS: CVP:  [23 mmHg] 23 mmHg VENTILATOR SETTINGS: Vent Mode:  [-] PRVC FiO2 (%):  [40 %] 40 % Set Rate:  [14 bmp] 14 bmp Vt Set:  [500 mL] 500 mL PEEP:  [5 cmH20] 5 cmH20 Plateau Pressure:  [23 cmH20-38 cmH20] 23 cmH20 INTAKE / OUTPUT: Intake/Output     05/10 0701 - 05/11 0700 05/11 0701 - 05/12 0700   I.V. (mL/kg) 3325.5 (30.8) 159 (1.5)   Blood 1024    NG/GT 420 30   IV Piggyback 1049    Total Intake(mL/kg) 5818.5 (53.9) 189 (1.8)   Urine (mL/kg/hr) 652 (0.3) 75 (0.3)   Emesis/NG output     Drains  107 (0)    Other 50 (0)    Blood 25 (0)    Total Output 834 75   Net +4984.5 +114          PHYSICAL EXAMINATION: General:  Sedated on vent HEENT: NCAT, sleral icterus noted, PERRL, ETT, OG tubes in place PULM: scattered rhonchi  CV: RRR, no mgr AB: central wound vac in place, no bowel sounds Ext: edema in feet, SCD's in place Neuro: arouses to voice, does not follow commands, moves all four ext   LABS:  Recent Labs Lab 11/12/12 1752 11/13/12 0415 11/14/12 0359  PHART 7.293* 7.365 7.406  PCO2ART 43.5 39.4 37.5  PO2ART 261.0* 94.0 73.0*  HCO3 20.4 22.0 23.1  TCO2 19.6 20.8 22.1  O2SAT 99.3 97.3 94.6     Recent Labs Lab 11/13/12 1045 11/14/12 0400 11/15/12 0431  NA 135 135 137  K 3.9 3.9 4.3  CL 105 104 106  CO2 24 24 24   BUN 19 25* 27*  CREATININE 2.37* 1.95* 1.46*  GLUCOSE 121* 114* 128*    Recent Labs Lab 11/13/12 1430 11/14/12 0400 11/15/12 0431  HGB 7.9* 7.6* 9.1*  HCT 24.5* 23.7* 28.5*  WBC 10.4 8.6 8.3  PLT 126* 114* 113*   Lab Results  Component Value Date   INR 2.05*  11/14/2012   INR 1.90* 11/13/2012   INR 1.92* 11/13/2012    Recent Labs Lab 11/12/12 0750 11/12/12 0817 11/12/12 0845 11/12/12 0923 11/12/12 1300  GLUCAP 57* 59* 63* 120* 128*   CXR:  5/8 >>> low vol. LLL ATX  ASSESSMENT / PLAN:  PULMONARY A:  Acute post operative respiratory failure.  At risk for TRALI (multiple transfusions). >CXR w/ L > R LL ATX vs infiltrate P:   Full mechanical support Daily SBT, do not extubate as open abdomen / back to OR to attempt closure this week Trend ABG / CXR  CARDIOVASCULAR A: Sepsis / septic shock secondary to peritonitis. Remains on high dose pressors.  P:  Goal MAP>60, wean pressors as able CVP goal >10 Started solucortef 5/9, d/c 5/11 with improvement in BP  RENAL A:  AKI, improving P:   Goal CVP>10 KVO  IVFs on 5/11 Start gentle diuresis 5/11 Renal dose meds   GASTROINTESTINAL A:  Alcoholic cirrhosis.  Acute  alcoholic hepatitis.  Perforated viscus in jejunem s/p repair 5/8 P:   CCS managing  Back to OR this week for closure NPO Protonix for GI Px Wound vac in place  HEMATOLOGIC A:  Coagulopathy. Significant intraoperative bleeding controlled with platelets and plasma. P:  Vit K; FFP given pre-op, and follow INR Cont PAS  INFECTIOUS  Recent Labs Lab 11/12/12 1757 11/13/12 0430 11/14/12 0400  PROCALCITON 2.51 2.78 1.54   A:  Peritonitis.  PCN / Cephalosporin allergy.  P:   Cultures and antibiotics as per flowsheet above Trend PCT 5/11 > day 4 abx  ENDOCRINE  A:  Hypoglycemia. P:   IVF as above  NEUROLOGIC A:  Acute encephalopathy.  Alcohol abuse. Post op pain. At risk for alcohol withdrawal. P:   Goal RASS  -1 to -2 Thiamin / Folate Fentanyl / Versed   TODAY'S SUMMARY: 67 y/o female with alcoholic cirrhosis admitted 5/7 and taken to the OR on 5/8 for a perforated jejunem.  Intraoperatively she had significant bleeding controlled with plasma/plt transfusions.  Likely will remain intubated until she goes back to the OR for closure. Shock resolved. Cover w/ ertapenem   I have personally obtained a history, examined the patient, evaluated laboratory and imaging results, formulated the assessment and plan and placed orders.  CRITICAL CARE:  The patient is critically ill with multiple organ systems failure and requires high complexity decision making for assessment and support, frequent evaluation and titration of therapies, application of advanced monitoring technologies and extensive interpretation of multiple databases. Critical Care Time devoted to patient care services described in this note is 35 minutes.   Levy Pupa, MD, PhD 11/15/2012, 9:27 AM [facility] [phone] or if no answer [phone]

## 2012-11-15 NOTE — Progress Notes (Addendum)
NUTRITION FOLLOW UP  Intervention:   1. Continue to monitor gastric residuals per protocol.  2. If patient able to tolerate enteral feeds with minimal residuals, increase rate of Vital AF 1.2 to goal rate of 30 ml/hr. Add 30 ml Prostat TID to provide 1164 kcal (69% estimated needs), 99 g protein (99% estimated needs), 584 ml free water.  3. If unable to tolerate enteral nutrition, consider initiate TPN per pharmacy to meet nutrition needs.   Nutrition Dx:   Inadequate oral intake related to inability to eat as evidenced by NPO status, vent status, and perforated bowel.   Goal:   Enteral nutrition to provide 60-70% of estimated calorie needs (22-25 kcals/kg ideal body weight) and 100% of estimated protein needs, based on ASPEN guidelines for permissive underfeeding in critically ill obese individuals.  Monitor:   Vent status, TF tolerance, residuals, weight, labs  Assessment:   Patient is s/p exploratory laparotomy with debridement of abdominal tissue and perforation repair. She returned to surgery yesterday for wound closure.  She is currently on full mechanical ventilation support.  She is receiving Vital AF 1.2 at 15 ml/hr (original goal rate of 30 ml/hr, not to advance until +BM). At goal rate, will provide 864 kcal, 54 g protein, 584 ml free water.  Per RN, patient with residuals of 475 ml, and 500 ml, protocol followed, TF rate cut in half. Residuals remain >500 ml, TF held at this time.  RN reports patient with no bowel sounds.   Height: Ht Readings from Last 1 Encounters:  11/12/12 5\' 5"  (1.651 m)    Weight Status:   Wt Readings from Last 1 Encounters:  11/15/12 237 lb 14 oz (107.9 kg)   Patient is currently intubated on ventilator support.  MV: 7 Temp:Temp (24hrs), Avg:97.4 F (36.3 C), Min:96.8 F (36 C), Max:97.8 F (36.6 C)  Re-estimated needs:  Kcal: 1687 kcal (1010-1180 kcal) Protein: 100-115 g Fluid: 2.7-2.8 L  Skin: 1 generalized edema, +1 RLE and LLE edema,  abdominal incision  Diet Order: NPO   Intake/Output Summary (Last 24 hours) at 11/15/12 1242 Last data filed at 11/15/12 1200  Gross per 24 hour  Intake 4893.5 ml  Output    627 ml  Net 4266.5 ml    Last BM: PTA   Labs:   Recent Labs Lab 11/13/12 1045 11/14/12 0400 11/15/12 0431  NA 135 135 137  K 3.9 3.9 4.3  CL 105 104 106  CO2 24 24 24   BUN 19 25* 27*  CREATININE 2.37* 1.95* 1.46*  CALCIUM 6.4* 6.7* 7.4*  MG  --   --  1.5  PHOS  --   --  3.8  GLUCOSE 121* 114* 128*   Sodium  Date/Time Value Range Status  11/15/2012  4:31 AM 137  135 - 145 mEq/L Final  11/14/2012  4:00 AM 135  135 - 145 mEq/L Final  11/13/2012 10:45 AM 135  135 - 145 mEq/L Final    Potassium  Date/Time Value Range Status  11/15/2012  4:31 AM 4.3  3.5 - 5.1 mEq/L Final  11/14/2012  4:00 AM 3.9  3.5 - 5.1 mEq/L Final  11/13/2012 10:45 AM 3.9  3.5 - 5.1 mEq/L Final    Phosphorus  Date/Time Value Range Status  11/15/2012  4:31 AM 3.8  2.3 - 4.6 mg/dL Final    Magnesium  Date/Time Value Range Status  11/15/2012  4:31 AM 1.5  1.5 - 2.5 mg/dL Final  10/14/8117  1:47 AM 1.8  1.5 -  2.5 mg/dL Final    CBG (last 3)   Recent Labs  11/12/12 1300  GLUCAP 128*    Scheduled Meds: . antiseptic oral rinse  1 application Mouth Rinse QID  . chlorhexidine  15 mL Mouth/Throat BID  . ertapenem (INVANZ) IV  1 g Intravenous Q24H  . folic acid  1 mg Intravenous Daily  . hydrocortisone sod succinate (SOLU-CORTEF) inj  50 mg Intravenous Q6H  . insulin aspart  0-15 Units Subcutaneous Q4H  . lip balm  1 application Topical BID  . pantoprazole (PROTONIX) IV  40 mg Intravenous Q12H  . phytonadione  10 mg Subcutaneous Daily  . thiamine  100 mg Intravenous Daily    Continuous Infusions: . sodium chloride 10 mL/hr at 11/15/12 1000  . feeding supplement (VITAL AF 1.2 CAL) 1,000 mL (11/14/12 1300)  . fentaNYL infusion INTRAVENOUS Stopped (11/15/12 0830)  . midazolam (VERSED) infusion Stopped (11/15/12 0830)     Linnell Fulling, RD, LDN Pager #: [phone] After-Hours Pager #: [phone]

## 2012-11-15 NOTE — Progress Notes (Signed)
Gastric residuals checked at noon with present and refed per protocol. Paged CCS. Dr. Daphine Deutscher ordered tube feeds stopped at this time.

## 2012-11-15 NOTE — Progress Notes (Signed)
Gastric residuals 500. Will refeed and check in 2 hours per protocol.

## 2012-11-15 NOTE — Progress Notes (Signed)
Gastric residuals at 0900. Refed contents. Will recheck at 1000.

## 2012-11-16 ENCOUNTER — Encounter (HOSPITAL_COMMUNITY): Payer: Self-pay | Admitting: Surgery

## 2012-11-16 ENCOUNTER — Inpatient Hospital Stay (HOSPITAL_COMMUNITY): Payer: BC Managed Care – PPO

## 2012-11-16 LAB — TYPE AND SCREEN
Donor AG Type: NEGATIVE
Unit division: 0

## 2012-11-16 LAB — PREPARE FRESH FROZEN PLASMA
Unit division: 0
Unit division: 0

## 2012-11-16 LAB — GLUCOSE, CAPILLARY
Glucose-Capillary: 124 mg/dL — ABNORMAL HIGH (ref 70–99)
Glucose-Capillary: 127 mg/dL — ABNORMAL HIGH (ref 70–99)

## 2012-11-16 LAB — CBC
HCT: 30.4 % — ABNORMAL LOW (ref 36.0–46.0)
Hemoglobin: 9.7 g/dL — ABNORMAL LOW (ref 12.0–15.0)
MCH: 29.9 pg (ref 26.0–34.0)
MCHC: 31.9 g/dL (ref 30.0–36.0)
RBC: 3.24 MIL/uL — ABNORMAL LOW (ref 3.87–5.11)

## 2012-11-16 LAB — BASIC METABOLIC PANEL
CO2: 27 mEq/L (ref 19–32)
Calcium: 8.2 mg/dL — ABNORMAL LOW (ref 8.4–10.5)
Creatinine, Ser: 1.27 mg/dL — ABNORMAL HIGH (ref 0.50–1.10)
Glucose, Bld: 133 mg/dL — ABNORMAL HIGH (ref 70–99)

## 2012-11-16 MED ORDER — FAT EMULSION 20 % IV EMUL
250.0000 mL | INTRAVENOUS | Status: AC
  Administered 2012-11-16: 250 mL via INTRAVENOUS
  Filled 2012-11-16: qty 250

## 2012-11-16 MED ORDER — POTASSIUM CHLORIDE 20 MEQ/15ML (10%) PO LIQD
20.0000 meq | Freq: Two times a day (BID) | ORAL | Status: DC
  Administered 2012-11-16 (×2): 20 meq
  Filled 2012-11-16 (×4): qty 15

## 2012-11-16 MED ORDER — DEXMEDETOMIDINE HCL IN NACL 400 MCG/100ML IV SOLN
0.4000 ug/kg/h | INTRAVENOUS | Status: DC
  Administered 2012-11-16 (×3): 0.4 ug/kg/h via INTRAVENOUS
  Administered 2012-11-17: 0.8 ug/kg/h via INTRAVENOUS
  Administered 2012-11-17 (×4): 0.4 ug/kg/h via INTRAVENOUS
  Administered 2012-11-18: 1.2 ug/kg/h via INTRAVENOUS
  Administered 2012-11-18: 0.8 ug/kg/h via INTRAVENOUS
  Administered 2012-11-18: 1.2 ug/kg/h via INTRAVENOUS
  Filled 2012-11-16 (×4): qty 50
  Filled 2012-11-16: qty 100
  Filled 2012-11-16 (×2): qty 50
  Filled 2012-11-16: qty 100
  Filled 2012-11-16 (×8): qty 50

## 2012-11-16 MED ORDER — TRACE MINERALS CR-CU-F-FE-I-MN-MO-SE-ZN IV SOLN
INTRAVENOUS | Status: AC
  Administered 2012-11-16: 17:00:00 via INTRAVENOUS
  Filled 2012-11-16: qty 1000

## 2012-11-16 MED ORDER — ALBUTEROL SULFATE (5 MG/ML) 0.5% IN NEBU
2.5000 mg | INHALATION_SOLUTION | Freq: Four times a day (QID) | RESPIRATORY_TRACT | Status: DC
  Administered 2012-11-16 – 2012-11-20 (×17): 2.5 mg via RESPIRATORY_TRACT
  Filled 2012-11-16 (×16): qty 0.5

## 2012-11-16 MED ORDER — FUROSEMIDE 10 MG/ML IJ SOLN
40.0000 mg | Freq: Two times a day (BID) | INTRAMUSCULAR | Status: DC
  Administered 2012-11-16 (×2): 40 mg via INTRAVENOUS
  Filled 2012-11-16 (×4): qty 4

## 2012-11-16 NOTE — Progress Notes (Signed)
PULMONARY  / CRITICAL CARE MEDICINE  Name: [name] [name] MRN: [id number] DOB: January 02, 1946    ADMISSION DATE:  11/11/2012 CONSULTATION DATE:  11/12/2012  REFERRING MD :  CCS PRIMARY SERVICE:  PCCM  CHIEF COMPLAINT:  Post op respiratory failure  BRIEF PATIENT DESCRIPTION: 67 yo with alcoholic liver disease was admitted on 5/7 with abdominal pain and was found on 5/8 to have a small bowel perforation.  She was taken to the OR and returned to the ICU with an open wound and mechanically ventilated.   SIGNIFICANT EVENTS / STUDIES:  5/7  Admitted with abdominal pain, nausea, vomiting 5/7  OR >>> Perforated viscus, peritonitis, lysis of adhesions, small bowel repair  LINES / TUBES: OETT 5/8 >>> OGT 5/8 >>> Foley 5/8 >>> R rad A-line 5/8 >>> L IJ CVL 5/8 >>  CULTURES: 5/8 Blood >>> 5/7 Urine >>>neg   ANTIBIOTICS: Aztreonam 5/8 >>>5/9 Flagyl 5/8 >>>5/9 Levaquin 5/8 x 1 Cipro 5/8 x 1 ertapenem 5/9>>>  Interval events: Weaning   VITAL SIGNS: Temp:  [97.8 F (36.6 C)-98.8 F (37.1 C)] 97.8 F (36.6 C) (05/12 0400) Pulse Rate:  [69-102] 102 (05/12 0749) Resp:  [12-19] 12 (05/12 0749) BP: (119-166)/(64-92) 166/92 mmHg (05/12 0749) SpO2:  [94 %-97 %] 96 % (05/12 0749) Arterial Line BP: (102-162)/(56-87) 138/73 mmHg (05/12 0700) FiO2 (%):  [40 %] 40 % (05/12 0749) Weight:  [109.8 kg (242 lb 1 oz)] 109.8 kg (242 lb 1 oz) (05/12 0400)  HEMODYNAMICS: CVP:  [16 mmHg-21 mmHg] 18 mmHg VENTILATOR SETTINGS: Vent Mode:  [-] CPAP;PSV FiO2 (%):  [40 %] 40 % Set Rate:  [14 bmp] 14 bmp Vt Set:  [500 mL] 500 mL PEEP:  [5 cmH20] 5 cmH20 Pressure Support:  [5 cmH20] 5 cmH20 Plateau Pressure:  [20 cmH20-25 cmH20] 20 cmH20 INTAKE / OUTPUT: Intake/Output     05/11 0701 - 05/12 0700 05/12 0701 - 05/13 0700   I.V. (mL/kg) 743.5 (6.8) 20 (0.2)   Blood     NG/GT 127.5    IV Piggyback 50    Total Intake(mL/kg) 921 (8.4) 20 (0.2)   Urine (mL/kg/hr) 767 (0.3) 110 (0.9)   Emesis/NG output  375 (0.1)    Drains     Other     Blood     Total Output 1142 110   Net -221 -90          PHYSICAL EXAMINATION: General:  Sedated on vent, agitated w/ minimal stim.  HEENT: NCAT, sleral icterus noted, PERRL, ETT, OG tubes in place PULM: scattered rhonchi, exp wheeze CV: RRR, no mgr AB: central wound vac in place, no bowel sounds Ext: anasarca, SCD's in place Neuro: arouses to voice, does not follow commands, moves all four ext   LABS:  Recent Labs Lab 11/12/12 1752 11/13/12 0415 11/14/12 0359 11/15/12 0932  PHART 7.293* 7.365 7.406 7.354  PCO2ART 43.5 39.4 37.5 43.9  PO2ART 261.0* 94.0 73.0* 85.6  HCO3 20.4 22.0 23.1 23.8  TCO2 19.6 20.8 22.1 22.6  O2SAT 99.3 97.3 94.6 96.3     Recent Labs Lab 11/14/12 0400 11/15/12 0431 11/16/12 0420  NA 135 137 141  K 3.9 4.3 4.1  CL 104 106 110  CO2 24 24 27   BUN 25* 27* 29*  CREATININE 1.95* 1.46* 1.27*  GLUCOSE 114* 128* 133*    Recent Labs Lab 11/14/12 0400 11/15/12 0431 11/16/12 0420  HGB 7.6* 9.1* 9.7*  HCT 23.7* 28.5* 30.4*  WBC 8.6 8.3 8.4  PLT 114* 113*  124*   Lab Results  Component Value Date   INR 2.05* 11/14/2012   INR 1.90* 11/13/2012   INR 1.92* 11/13/2012    Recent Labs Lab 11/15/12 1549 11/15/12 1949 11/15/12 2321 11/16/12 0356 11/16/12 0741  GLUCAP 126* 127* 123* 124* 120*   CXR: 5/12: Left > right airspace disease. Prob a mix of ALI and volume   ASSESSMENT / PLAN:  PULMONARY A:  Acute post operative respiratory failure.  At risk for TRALI (multiple transfusions). >CXR w/ L > R LL ATX vs infiltrate. No sig change radiographically. She has sig accessory muscle use w/ forced exhale phase.  P:   Diurese as able Add BDs  Daily assessment for weaning  F/u cxr  CARDIOVASCULAR A: Sepsis / septic shock secondary to peritonitis.-->resolved  P:  Now working on volume removal D/c hydrocort (random was 69.9) RENAL A:  AKI, improving P:   euvolemic to neg vol status as creatinine will  allow F/u chem Close obs of lytes while being diuresed   GASTROINTESTINAL A:  Alcoholic cirrhosis.  Acute alcoholic hepatitis.  Perforated viscus in jejunem s/p repair 5/8 P:   CCS managing. Start TF. Protonix for GI Px Wound vac in place  HEMATOLOGIC A:  Coagulopathy (presume r/t liver disease). Significant intraoperative bleeding controlled with platelets and plasma.       Post-op anemia: hgb holding s/p transfusions P:  INR goal < 2 Repeat PT/INT  INFECTIOUS  Recent Labs Lab 11/12/12 1757 11/13/12 0430 11/14/12 0400  PROCALCITON 2.51 2.78 1.54   A:  Peritonitis.  PCN / Cephalosporin allergy.  P:   Cultures and antibiotics as per flowsheet above Trend PCT See dashboard   ENDOCRINE  A:  Hypoglycemia. P:   IVF as above  NEUROLOGIC A:  Acute encephalopathy. H/o Alcohol abuse. Post op pain. At risk for alcohol withdrawal. P:   Goal RASS  -1 to -2 Thiamin / Folate Fentanyl / Versed -->transition to precedex   TODAY'S SUMMARY: 67 y/o female with alcoholic cirrhosis admitted 5/7 and taken to the OR on 5/8 for a perforated jejunem.  I have personally obtained a history, examined the patient, evaluated laboratory and imaging results, formulated the assessment and plan and placed orders.  CRITICAL CARE:  The patient is critically ill with multiple organ systems failure and requires high complexity decision making for assessment and support, frequent evaluation and titration of therapies, application of advanced monitoring technologies and extensive interpretation of multiple databases. Critical Care Time devoted to patient care services described in this note is 35 minutes.   Alyson Reedy, M.D. [facility] Pulmonary/Critical Care Medicine. Pager: [phone]. After hours pager: [phone].

## 2012-11-16 NOTE — Progress Notes (Signed)
PARENTERAL NUTRITION CONSULT NOTE - INITIAL  Pharmacy Consult for TPN Indication: Intolerance to enteral feeding, s/p repair of bowel perforation  Allergies  Allergen Reactions  . Cephalexin Rash  . Penicillins Rash    Patient Measurements: Height: 5\' 5"  (165.1 cm) Weight: 242 lb 1 oz (109.8 kg) IBW/kg (Calculated) : 57 Usual Weight:   Vital Signs: Temp: 97.8 F (36.6 C) (05/12 0800) Temp src: Axillary (05/12 0800) BP: 177/114 mmHg (05/12 0800) Pulse Rate: 103 (05/12 0800) Intake/Output from previous day: 05/11 0701 - 05/12 0700 In: 921 [I.V.:743.5; NG/GT:127.5; IV Piggyback:50] Out: 1142 [Urine:767; Emesis/NG output:375] Intake/Output from this shift: Total I/O In: 20 [I.V.:20] Out: 110 [Urine:110]  Labs:  Recent Labs  11/13/12 1430 11/13/12 1815 11/14/12 0400 11/15/12 0431 11/16/12 0420  WBC 10.4  --  8.6 8.3 8.4  HGB 7.9*  --  7.6* 9.1* 9.7*  HCT 24.5*  --  23.7* 28.5* 30.4*  PLT 126*  --  114* 113* 124*  INR 1.92* 1.90* 2.05*  --   --      Recent Labs  11/14/12 0400 11/15/12 0431 11/16/12 0420  NA 135 137 141  K 3.9 4.3 4.1  CL 104 106 110  CO2 24 24 27   GLUCOSE 114* 128* 133*  BUN 25* 27* 29*  CREATININE 1.95* 1.46* 1.27*  CALCIUM 6.7* 7.4* 8.2*  MG  --  1.5 1.7  PHOS  --  3.8 3.3  PROT 5.1*  --   --   ALBUMIN 1.7*  --   --   AST 52*  --   --   ALT 19  --   --   ALKPHOS 81  --   --   BILITOT 4.0*  --   --    Estimated Creatinine Clearance: 55.2 ml/min (by C-G formula based on Cr of 1.27).    Recent Labs  11/15/12 2321 11/16/12 0356 11/16/12 0741  GLUCAP 123* 124* 120*    Medical History: Past Medical History  Diagnosis Date  . Hypertension   . H/O brain surgery   . Aneurysm of anterior cerebral artery   . Hernia   . Anxiety   . Stroke   . Anemia   . Arthritis     Medications:  Scheduled:  . antiseptic oral rinse  1 application Mouth Rinse QID  . chlorhexidine  15 mL Mouth/Throat BID  . ertapenem (INVANZ) IV  1 g  Intravenous Q24H  . folic acid  1 mg Intravenous Daily  . furosemide  40 mg Intravenous Q12H  . insulin aspart  0-15 Units Subcutaneous Q4H  . lip balm  1 application Topical BID  . pantoprazole (PROTONIX) IV  40 mg Intravenous Q12H  . [COMPLETED] phytonadione  10 mg Subcutaneous Daily  . potassium chloride  20 mEq Per Tube Q12H  . thiamine  100 mg Intravenous Daily  . [DISCONTINUED] hydrocortisone sod succinate (SOLU-CORTEF) inj  50 mg Intravenous Q6H   Infusions:  . sodium chloride 20 mL/hr at 11/15/12 2319  . dexmedetomidine    . feeding supplement (VITAL AF 1.2 CAL) Stopped (11/15/12 1230)  . [DISCONTINUED] sodium chloride 1,000 mL (11/14/12 0626)  . [DISCONTINUED] fentaNYL infusion INTRAVENOUS    . [DISCONTINUED] lactated ringers Stopped (11/15/12 1000)  . [DISCONTINUED] midazolam (VERSED) infusion Stopped (11/15/12 0830)    Nutritional Goals:   RD recs pending  Clinimix 5/15 at 83 ml/hr + Lipids MWF to provide 100 g protein/day and an average of  1620 Kcal/day.   Current nutrition:  Diet: NPO  Enteral feeding: started 5/10, stopped 5/11  IVF: NS at [facility]  CBGs & Insulin requirements past 24 hours:   CBGs:  120-136  Moderate SSI ordered: 10 units/ 24hrs  Assessment:   67 yo F admit 5/7 with abdominal pain and on 5/8 went to OR for exp laparatomy with repair of perforated small bowel and abdominal wall debridement.  Due to diffuse peritonitis, wound closure was delayed; pt was returned to OR on 5/10 for peritoneal washout, debridement, abdominal wall closure.  Enteral feeding goal is Vital AF 1.2 cal at 30 ml/hr.  Tube feeds were started 5/10, but d/c on 5/12 d/t high residuals ( )  Renal/hepatic function: SCr is improving, AST elevated (Hx of alcoholic cirrhosis)  Electrolytes: wnl  Pre-Albumin: pending  TG/Cholesterol: pending  Plan:  At 1800 tonight Start Clinimix E 5/15 at 40 ml/hr Fat emulsion at 10 ml/hr (MWF only due to ongoing shortage). TNA to  contain standard multivitamins and trace element s(MWF only due to ongoing shortage). Continue IVF at 10-20 ml/hr. Continue moderate SSI .  TNA lab panels on Mondays & Thursdays. F/u daily.   Lynann Beaver PharmD, BCPS Pager [phone] 11/16/2012 9:54 AM

## 2012-11-16 NOTE — Progress Notes (Signed)
Patient ID: [name] [name], female   DOB: 04-Nov-1945, 67 y.o.   MRN: [id number] 2 Days Post-Op  Subjective: Sedated on vent, no distress.  Opens eyes.  Weaning trial in progress.  Did not tolerate TF due to high residual-held  Objective: Vital signs in last 24 hours: Temp:  [97.8 F (36.6 C)-98.8 F (37.1 C)] 97.8 F (36.6 C) (05/12 0800) Pulse Rate:  [69-103] 103 (05/12 0800) Resp:  [12-19] 12 (05/12 0800) BP: (119-177)/(64-114) 177/114 mmHg (05/12 0800) SpO2:  [94 %-97 %] 96 % (05/12 0800) Arterial Line BP: (102-164)/(56-90) 164/90 mmHg (05/12 0800) FiO2 (%):  [40 %] 40 % (05/12 0800) Weight:  [242 lb 1 oz (109.8 kg)] 242 lb 1 oz (109.8 kg) (05/12 0400) Last BM Date: 11/10/12  Intake/Output from previous day: 05/11 0701 - 05/12 0700 In: 921 [I.V.:743.5; NG/GT:127.5; IV Piggyback:50] Out: 1142 [Urine:767; Emesis/NG output:375] Intake/Output this shift: Total I/O In: 20 [I.V.:20] Out: 110 [Urine:110]  General appearance: Sedated on vent, opens eyes Resp: rhonchi bilaterally and mild GI: normal findings: soft, non-tender and not apparently distended Incision/Wound: VAC in place, Clean and dry  Lab Results:   Recent Labs  11/15/12 0431 11/16/12 0420  WBC 8.3 8.4  HGB 9.1* 9.7*  HCT 28.5* 30.4*  PLT 113* 124*   BMET  Recent Labs  11/15/12 0431 11/16/12 0420  NA 137 141  K 4.3 4.1  CL 106 110  CO2 24 27  GLUCOSE 128* 133*  BUN 27* 29*  CREATININE 1.46* 1.27*  CALCIUM 7.4* 8.2*     Studies/Results: Dg Chest Port 1 View  11/16/2012  *RADIOLOGY REPORT*  Clinical Data: Endotracheal placement  PORTABLE CHEST - 1 VIEW  Comparison: 11/15/2012  Findings: Endotracheal tube has its tip 2.5 cm above the carina. Nasogastric tube enters the stomach.  Left internal jugular central line has its tip at the SVC/RA junction.  Heart size is stable. Patchy density in both lung bases persist consistent atelectasis and/or pneumonia.  No new finding.  IMPRESSION: Lines and  tubes grossly well positioned.  Lower lobe atelectasis and/or pneumonia persists bilaterally.   Original Report Authenticated By: Paulina Fusi, M.D.    Dg Chest Port 1 View  11/15/2012  *RADIOLOGY REPORT*  Clinical Data: Intubated patient.  PORTABLE CHEST - 1 VIEW  Comparison: The tip in the portable chest 11/14/2012.  Findings: Support tubes and lines are unchanged.  There has been some increase in right basilar atelectasis.  Small left effusion and basilar airspace disease are unchanged.  No pneumothorax.  IMPRESSION:  1.  Support apparatus unchanged and in good position. 2.  Increased right basilar atelectasis. 3.  No marked change in a small left effusion and basilar airspace disease.   Original Report Authenticated By: Holley Dexter, M.D.     Anti-infectives: Anti-infectives   Start     Dose/Rate Route Frequency Ordered Stop   11/13/12 1500  ertapenem (INVANZ) 1 g in sodium chloride 0.9 % 50 mL IVPB     1 g 100 mL/hr over 30 Minutes Intravenous Every 24 hours 11/12/12 1758     11/13/12 1000  Levofloxacin (LEVAQUIN) IVPB 250 mg  Status:  Discontinued     250 mg 50 mL/hr over 60 Minutes Intravenous Every 24 hours 11/12/12 0953 11/12/12 1259   11/12/12 2200  ciprofloxacin (CIPRO) IVPB 400 mg  Status:  Discontinued     400 mg 200 mL/hr over 60 Minutes Intravenous Every 12 hours 11/12/12 1308 11/12/12 1747   11/12/12 1830  aztreonam (AZACTAM)  1 g in dextrose 5 % 50 mL IVPB  Status:  Discontinued     1 g 100 mL/hr over 30 Minutes Intravenous Every 8 hours 11/12/12 1811 11/13/12 0931   11/12/12 1446  ertapenem (INVANZ) 1 g in sodium chloride 0.9 % 50 mL IVPB  Status:  Discontinued    Comments:  stat   1 g 100 mL/hr over 30 Minutes Intravenous 60 min pre-op 11/12/12 1444 11/12/12 1700   11/12/12 1400  metroNIDAZOLE (FLAGYL) IVPB 500 mg  Status:  Discontinued     500 mg 100 mL/hr over 60 Minutes Intravenous Every 8 hours 11/12/12 1308 11/13/12 0911   11/12/12 1000  levofloxacin (LEVAQUIN)  IVPB 500 mg     500 mg 100 mL/hr over 60 Minutes Intravenous  Once 11/12/12 0942 11/12/12 1157      Assessment/Plan: s/p Procedure(s): Laparotomy and SB repair.  Delayed abd wall closure Overall improved, ARF resolving, weaning vent FEN- Unable to tolerate TF. Expect slow return of GI function.  Will ask pharmacy to see re TNA Change wound VAC tomorrow    LOS: 5 days    Domanique Huesman T 11/16/2012

## 2012-11-17 ENCOUNTER — Inpatient Hospital Stay (HOSPITAL_COMMUNITY): Payer: BC Managed Care – PPO

## 2012-11-17 LAB — COMPREHENSIVE METABOLIC PANEL
ALT: 40 U/L — ABNORMAL HIGH (ref 0–35)
AST: 92 U/L — ABNORMAL HIGH (ref 0–37)
Alkaline Phosphatase: 114 U/L (ref 39–117)
CO2: 28 mEq/L (ref 19–32)
Calcium: 8.3 mg/dL — ABNORMAL LOW (ref 8.4–10.5)
Chloride: 107 mEq/L (ref 96–112)
GFR calc non Af Amer: 44 mL/min — ABNORMAL LOW (ref >90)
Potassium: 3.5 mEq/L (ref 3.5–5.1)
Sodium: 140 mEq/L (ref 135–145)

## 2012-11-17 LAB — GLUCOSE, CAPILLARY
Glucose-Capillary: 196 mg/dL — ABNORMAL HIGH (ref 70–99)
Glucose-Capillary: 200 mg/dL — ABNORMAL HIGH (ref 70–99)
Glucose-Capillary: 157 mg/dL — ABNORMAL HIGH (ref 70–99)
Glucose-Capillary: 158 mg/dL — ABNORMAL HIGH (ref 70–99)

## 2012-11-17 LAB — CBC
MCH: 30.3 pg (ref 26.0–34.0)
Platelets: 113 10*3/uL — ABNORMAL LOW (ref 150–400)
RBC: 3.37 MIL/uL — ABNORMAL LOW (ref 3.87–5.11)
WBC: 7.7 10*3/uL (ref 4.0–10.5)

## 2012-11-17 LAB — PHOSPHORUS: Phosphorus: 3.1 mg/dL (ref 2.3–4.6)

## 2012-11-17 MED ORDER — MAGNESIUM SULFATE 40 MG/ML IJ SOLN
2.0000 g | Freq: Once | INTRAMUSCULAR | Status: AC
  Administered 2012-11-17: 2 g via INTRAVENOUS
  Filled 2012-11-17: qty 50

## 2012-11-17 MED ORDER — INSULIN ASPART 100 UNIT/ML ~~LOC~~ SOLN
0.0000 [IU] | SUBCUTANEOUS | Status: DC
  Administered 2012-11-17 – 2012-11-18 (×5): 4 [IU] via SUBCUTANEOUS
  Administered 2012-11-18 (×3): 3 [IU] via SUBCUTANEOUS
  Administered 2012-11-18: 4 [IU] via SUBCUTANEOUS
  Administered 2012-11-19 (×4): 3 [IU] via SUBCUTANEOUS
  Administered 2012-11-19: 4 [IU] via SUBCUTANEOUS
  Administered 2012-11-19 – 2012-11-22 (×18): 3 [IU] via SUBCUTANEOUS
  Administered 2012-11-22 – 2012-11-23 (×6): 4 [IU] via SUBCUTANEOUS
  Administered 2012-11-23: 3 [IU] via SUBCUTANEOUS
  Administered 2012-11-24: 4 [IU] via SUBCUTANEOUS
  Administered 2012-11-24: 3 [IU] via SUBCUTANEOUS
  Administered 2012-11-24: 4 [IU] via SUBCUTANEOUS
  Administered 2012-11-24 – 2012-11-26 (×6): 3 [IU] via SUBCUTANEOUS

## 2012-11-17 MED ORDER — POTASSIUM CHLORIDE 10 MEQ/50ML IV SOLN
10.0000 meq | INTRAVENOUS | Status: AC
  Administered 2012-11-17 (×2): 10 meq via INTRAVENOUS
  Filled 2012-11-17: qty 100

## 2012-11-17 MED ORDER — LABETALOL HCL 100 MG PO TABS: 100.0000 mg | ORAL_TABLET | Freq: Two times a day (BID) | ORAL | Status: DC

## 2012-11-17 MED ORDER — FUROSEMIDE 10 MG/ML IJ SOLN
40.0000 mg | Freq: Four times a day (QID) | INTRAMUSCULAR | Status: AC
  Administered 2012-11-17 (×3): 40 mg via INTRAVENOUS
  Filled 2012-11-17: qty 4

## 2012-11-17 MED ORDER — FUROSEMIDE 10 MG/ML IJ SOLN: 20.0000 mg | Freq: Four times a day (QID) | INTRAMUSCULAR | Status: DC

## 2012-11-17 MED ORDER — INSULIN REGULAR HUMAN 100 UNIT/ML IJ SOLN
INTRAVENOUS | Status: AC
  Administered 2012-11-17: 17:00:00 via INTRAVENOUS
  Filled 2012-11-17: qty 2000

## 2012-11-17 MED ORDER — HYDRALAZINE HCL 20 MG/ML IJ SOLN
5.0000 mg | INTRAMUSCULAR | Status: DC | PRN
  Administered 2012-11-17 – 2012-11-19 (×3): 5 mg via INTRAVENOUS
  Filled 2012-11-17 (×3): qty 1

## 2012-11-17 NOTE — Progress Notes (Signed)
Hypertension   Hydralazine prn ordered

## 2012-11-17 NOTE — Progress Notes (Signed)
PARENTERAL NUTRITION CONSULT NOTE   Pharmacy Consult for TPN Indication: Intolerance to enteral feeding, s/p repair of bowel perforation  Allergies  Allergen Reactions  . Cephalexin Rash  . Penicillins Rash    Patient Measurements: Height: 5\' 5"  (165.1 cm) Weight: 238 lb 1.6 oz (108 kg) IBW/kg (Calculated) : 57  Vital Signs: Temp: 97.2 F (36.2 C) (05/13 0400) Temp src: Oral (05/13 0400) BP: 166/89 mmHg (05/13 0434) Pulse Rate: 63 (05/13 0600) Intake/Output from previous day: 05/12 0701 - 05/13 0700 In: 1469 [I.V.:659; NG/GT:60; IV Piggyback:50; TPN:700] Out: 3380 [Urine:3380]  Labs:  Recent Labs  11/15/12 0431 11/16/12 0420 11/16/12 1200 11/17/12 0440  WBC 8.3 8.4  --  7.7  HGB 9.1* 9.7*  --  10.2*  HCT 28.5* 30.4*  --  31.4*  PLT 113* 124*  --  113*  INR  --   --  1.88*  --      Recent Labs  11/15/12 0431 11/16/12 0420 11/17/12 0440  NA 137 141 140  K 4.3 4.1 3.5  CL 106 110 107  CO2 24 27 28   GLUCOSE 128* 133* 209*  BUN 27* 29* 35*  CREATININE 1.46* 1.27* 1.26*  CALCIUM 7.4* 8.2* 8.3*  MG 1.5 1.7 1.8  PHOS 3.8 3.3 3.1  PROT  --   --  5.8*  ALBUMIN  --   --  1.6*  AST  --   --  92*  ALT  --   --  40*  ALKPHOS  --   --  114  BILITOT  --   --  2.9*   Estimated Creatinine Clearance: 55.1 ml/min (by C-G formula based on Cr of 1.26).    Recent Labs  11/16/12 1601 11/16/12 2019 11/17/12 0010  GLUCAP 127* 172* 190*    Nutritional Goals:   RD recs pending  Clinimix 5/15 at 83 ml/hr + Lipids MWF to provide 100 g protein/day and an average of  1620 Kcal/day.   Current nutrition:   Diet: NPO  Enteral feeding: started 5/10, stopped 5/11  IVF: NS at [facility]  CBGs & Insulin requirements past 24 hours:   CBGs:  115-190  Moderate SSI: 11 units/ 24hrs  Assessment:   67 yo F admit 5/7 with [name] pain and on 5/8 went to OR for exp laparatomy with repair of perforated small bowel and [name] wall [name] [name].  Due to diffuse  [name], [name] [name], [name] [name], [name] [name] started 5/10, but d/c on 5/12 d/t high residuals ( )   Renal/hepatic function: SCr is improving, AST/ALT elevated (Hx of alcoholic cirrhosis).   Electrolytes: wnl, magnesium bolus ordered per Elink.  Corrected Ca 10.2  Pre-Albumin: in process (5/13)  TG/Cholesterol: wnl (5/13)  Glucose:  Large increase after TNA started, hydrocortisone d/c on 5/12.  Plan:  At 1800 tonight Increase to Clinimix E 5/15 at 60 ml/hr Add 10 units/L regular insulin to TPN (20 units in 2L bag) Fat emulsion at 10 ml/hr (MWF only due to ongoing shortage). TNA to contain standard multivitamins and trace elements (MWF only due to ongoing shortage). Continue IVF at 10-20 ml/hr. Increase to resistant SSI q4h. TNA lab panels on Mondays & Thursdays. F/u daily.    Lynann Beaver PharmD, BCPS Pager [phone] 11/17/2012 9:08 AM

## 2012-11-17 NOTE — Progress Notes (Signed)
Patient ID: [name] [name], female   DOB: 09/14/1945, 66 y.o.   MRN: [id number] 3 Days Post-Op  Subjective: On vent, some what agitated right now but doesn't really follow commands yet so still somewhat sedated, no acute distress, breathing on own at times  Objective: Vital signs in last 24 hours: Temp:  [96.2 F (35.7 C)-98.1 F (36.7 C)] 96.2 F (35.7 C) (05/13 0800) Pulse Rate:  [63-107] 102 (05/13 0830) Resp:  [9-17] 17 (05/13 0830) BP: (145-171)/(80-95) 145/81 mmHg (05/13 0740) SpO2:  [93 %-98 %] 95 % (05/13 0830) Arterial Line BP: (120-168)/(69-95) 155/88 mmHg (05/13 0830) FiO2 (%):  [40 %] 40 % (05/13 0900) Weight:  [238 lb 1.6 oz (108 kg)] 238 lb 1.6 oz (108 kg) (05/13 0100) Last BM Date: 11/10/12  Intake/Output from previous day: 05/12 0701 - 05/13 0700 In: 1550 [I.V.:690; NG/GT:60; IV Piggyback:50; TPN:750] Out: 3380 [Urine:3380] Intake/Output this shift: Total I/O In: 75.5 [I.V.:25.5; TPN:50] Out: 90 [Urine:90]  General: Sedated on vent, moving arms and upper body, turning head side to side, fighting vent Resp: rhonchi bilaterally and mild GI: soft, grimaces to palp and to removal of vac, healthy tissue in base with sutures visible, small area of fat necrosis in mid wound, wound measures 20cm x 5cm x 3cm with <10% slough, no undermining or tunneling   Lab Results:   Recent Labs  11/16/12 0420 11/17/12 0440  WBC 8.4 7.7  HGB 9.7* 10.2*  HCT 30.4* 31.4*  PLT 124* 113*   BMET  Recent Labs  11/16/12 0420 11/17/12 0440  NA 141 140  K 4.1 3.5  CL 110 107  CO2 27 28  GLUCOSE 133* 209*  BUN 29* 35*  CREATININE 1.27* 1.26*  CALCIUM 8.2* 8.3*     Studies/Results: Dg Chest Port 1 View  11/17/2012  *RADIOLOGY REPORT*  Clinical Data: Status post repair of small bowel perforation. Ventilated patient.  PORTABLE CHEST - 1 VIEW  Comparison: Chest 11/16/2012.  Findings: Support tubes and lines are unchanged.  Bibasilar airspace opacities persist and appear  worsened on the right.  No pneumothorax identified.  Heart size is normal.  IMPRESSION: Some worsening of airspace disease in the right lung base.  No change on the left.   Original Report Authenticated By: Holley Dexter, M.D.    Dg Chest Port 1 View  11/16/2012  *RADIOLOGY REPORT*  Clinical Data: Endotracheal placement  PORTABLE CHEST - 1 VIEW  Comparison: 11/15/2012  Findings: Endotracheal tube has its tip 2.5 cm above the carina. Nasogastric tube enters the stomach.  Left internal jugular central line has its tip at the SVC/RA junction.  Heart size is stable. Patchy density in both lung bases persist consistent atelectasis and/or pneumonia.  No new finding.  IMPRESSION: Lines and tubes grossly well positioned.  Lower lobe atelectasis and/or pneumonia persists bilaterally.   Original Report Authenticated By: Paulina Fusi, M.D.     Anti-infectives: Anti-infectives   Start     Dose/Rate Route Frequency Ordered Stop   11/13/12 1500  ertapenem (INVANZ) 1 g in sodium chloride 0.9 % 50 mL IVPB     1 g 100 mL/hr over 30 Minutes Intravenous Every 24 hours 11/12/12 1758     11/13/12 1000  Levofloxacin (LEVAQUIN) IVPB 250 mg  Status:  Discontinued     250 mg 50 mL/hr over 60 Minutes Intravenous Every 24 hours 11/12/12 0953 11/12/12 1259   11/12/12 2200  ciprofloxacin (CIPRO) IVPB 400 mg  Status:  Discontinued     400  mg 200 mL/hr over 60 Minutes Intravenous Every 12 hours 11/12/12 1308 11/12/12 1747   11/12/12 1830  aztreonam (AZACTAM) 1 g in dextrose 5 % 50 mL IVPB  Status:  Discontinued     1 g 100 mL/hr over 30 Minutes Intravenous Every 8 hours 11/12/12 1811 11/13/12 0931   11/12/12 1446  ertapenem (INVANZ) 1 g in sodium chloride 0.9 % 50 mL IVPB  Status:  Discontinued    Comments:  stat   1 g 100 mL/hr over 30 Minutes Intravenous 60 min pre-op 11/12/12 1444 11/12/12 1700   11/12/12 1400  metroNIDAZOLE (FLAGYL) IVPB 500 mg  Status:  Discontinued     500 mg 100 mL/hr over 60 Minutes  Intravenous Every 8 hours 11/12/12 1308 11/13/12 0911   11/12/12 1000  levofloxacin (LEVAQUIN) IVPB 500 mg     500 mg 100 mL/hr over 60 Minutes Intravenous  Once 11/12/12 1610 11/12/12 1157      Assessment/Plan: POD#5-Exp lap, debride of abd wall tissue 2 cm x 8 cm, extensive LOA, repair SB perforation, enterorrhaphy x1 POD#3-Closure of abd wound/washout/debride of fascia: wound vac changed today, wound healthy, change MWF Acute resp failure: per CCM, vent weaning AKI: improving, stable for now, UOP good FEN: TNA via picc for now, once bowel function returns then we can start TFs VTE:  scds only, coagulopathy (presume r/t liver disease), INR 1.88 yesterday Dispo: likely will need SNF/rehab   LOS: 6 days    WHITE, Helyn 11/17/2012

## 2012-11-17 NOTE — Progress Notes (Signed)
PULMONARY  / CRITICAL CARE MEDICINE  Name: [name] [name] MRN: [id number] DOB: [birth year]    ADMISSION DATE:  11/11/2012 CONSULTATION DATE:  11/12/2012  REFERRING MD :  CCS PRIMARY SERVICE:  PCCM  CHIEF COMPLAINT:  Post op respiratory failure  BRIEF PATIENT DESCRIPTION: 67 yo with alcoholic liver disease was admitted on 5/7 with abdominal pain and was found on 5/8 to have a small bowel perforation.  She was taken to the OR and returned to the ICU with an open wound and mechanically ventilated.   SIGNIFICANT EVENTS / STUDIES:  5/7  Admitted with abdominal pain, nausea, vomiting 5/7  OR >>> Perforated viscus, peritonitis, lysis of adhesions, small bowel repair  LINES / TUBES: OETT 5/8 >>> OGT 5/8 >>> Foley 5/8 >>> R rad A-line 5/8 >>> L IJ CVL 5/8 >>  CULTURES: 5/8 Blood >>>None 5/7 Urine >>>neg   ANTIBIOTICS: Aztreonam 5/8 >>>5/9 Flagyl 5/8 >>>5/9 Levaquin 5/8 x 1 Cipro 5/8 x 1 ertapenem 5/9>>>  Interval events: Weaning   VITAL SIGNS: Temp:  [97.2 F (36.2 C)-98.1 F (36.7 C)] 97.2 F (36.2 C) (05/13 0400) Pulse Rate:  [63-107] 75 (05/13 0740) Resp:  [9-15] 14 (05/13 0740) BP: (145-171)/(80-95) 145/81 mmHg (05/13 0740) SpO2:  [93 %-98 %] 97 % (05/13 0740) Arterial Line BP: (120-168)/(69-95) 151/77 mmHg (05/13 0740) FiO2 (%):  [40 %] 40 % (05/13 0740) Weight:  [108 kg (238 lb 1.6 oz)] 108 kg (238 lb 1.6 oz) (05/13 0100)  HEMODYNAMICS: CVP:  [23 mmHg] 23 mmHg VENTILATOR SETTINGS: Vent Mode:  [-] PSV FiO2 (%):  [40 %] 40 % Set Rate:  [14 bmp] 14 bmp Vt Set:  [500 mL] 500 mL PEEP:  [5 cmH20] 5 cmH20 Pressure Support:  [5 cmH20-10 cmH20] 5 cmH20 Plateau Pressure:  [22 cmH20-30 cmH20] 30 cmH20 INTAKE / OUTPUT: Intake/Output     05/12 0701 - 05/13 0700 05/13 0701 - 05/14 0700   I.V. (mL/kg) 659 (6.1)    NG/GT 60    IV Piggyback 50    TPN 700    Total Intake(mL/kg) 1469 (13.6)    Urine (mL/kg/hr) 3380 (1.3)    Emesis/NG output     Total Output 3380     Net -1911           PHYSICAL EXAMINATION: General:  Sedated on vent, arousable but not purposeful.  HEENT: NCAT, minimal sleral icterus noted, PERRL, ETT, OG tubes in place PULM: Scattered rhonchi, exp wheeze CV: RRR, no mgr AB: Central wound vac in place, no bowel sounds Ext: Anasarca improving, SCD's in place Neuro: arouses to voice, does not follow commands, moves all four ext   LABS:  Recent Labs Lab 11/12/12 1752 11/13/12 0415 11/14/12 0359 11/15/12 0932  PHART 7.293* 7.365 7.406 7.354  PCO2ART 43.5 39.4 37.5 43.9  PO2ART 261.0* 94.0 73.0* 85.6  HCO3 20.4 22.0 23.1 23.8  TCO2 19.6 20.8 22.1 22.6  O2SAT 99.3 97.3 94.6 96.3     Recent Labs Lab 11/15/12 0431 11/16/12 0420 11/17/12 0440  NA 137 141 140  K 4.3 4.1 3.5  CL 106 110 107  CO2 24 27 28   BUN 27* 29* 35*  CREATININE 1.46* 1.27* 1.26*  GLUCOSE 128* 133* 209*    Recent Labs Lab 11/15/12 0431 11/16/12 0420 11/17/12 0440  HGB 9.1* 9.7* 10.2*  HCT 28.5* 30.4* 31.4*  WBC 8.3 8.4 7.7  PLT 113* 124* 113*   Lab Results  Component Value Date   INR 1.88* 11/16/2012   INR  2.05* 11/14/2012   INR 1.90* 11/13/2012   Recent Labs Lab 11/16/12 1124 11/16/12 1543 11/16/12 1601 11/16/12 2019 11/17/12 0010  GLUCAP 115* 134* 127* 172* 190*   CXR: 5/12: Left > right airspace disease. Prob a mix of ALI and volume   ASSESSMENT / PLAN:  PULMONARY A:  Acute post operative respiratory failure.  At risk for TRALI (multiple transfusions). >CXR w/ L > R LL ATX vs infiltrate. No sig change radiographically. She has sig accessory muscle use w/ forced exhale phase.  P:   - Diurese as ordered. - Added BDs. - PS as tolerated, no extubation until more diureses. - F/u cxr.  CARDIOVASCULAR A: Sepsis / septic shock secondary to peritonitis.-->resolved  P:  - Now working on volume removal. - D/c hydrocort (random was 69.9).  RENAL A:  AKI, improving P:   - Lasix as ordered for anasarca. - F/u chem. - Close obs  of lytes while being diuresed.  GASTROINTESTINAL A:  Alcoholic cirrhosis.  Acute alcoholic hepatitis.  Perforated viscus in jejunem s/p repair 5/8 P:   - CCS managing. - TF not tolerated, TPN started, minimal K in TPN. - Protonix for GI Px. - Wound vac in place, will be managed bedside at this point.  HEMATOLOGIC A:  Coagulopathy (presume r/t liver disease). Significant intraoperative bleeding controlled with platelets and plasma.       Post-op anemia: hgb holding s/p transfusions P:  - INR goal < 2, 1.88 yesterday. - Repeat PT/INR in AM.  INFECTIOUS  Recent Labs Lab 11/12/12 1757 11/13/12 0430 11/14/12 0400  PROCALCITON 2.51 2.78 1.54   A:  Peritonitis.  PCN / Cephalosporin allergy.  P:   - Cultures and antibiotics as per flowsheet above. - See dashboard.  ENDOCRINE  A:  Hypoglycemia. P:   - ISS.  NEUROLOGIC A:  Acute encephalopathy. H/o Alcohol abuse. Post op pain. At risk for alcohol withdrawal. P:   - Goal RASS  -1 to -2. - Thiamin / Folate. - Precedex and PRN fentanyl.  TODAY'S SUMMARY: 67 y/o female with alcoholic cirrhosis admitted 5/7 and taken to the OR on 5/8 for a perforated jejunem.  I have personally obtained a history, examined the patient, evaluated laboratory and imaging results, formulated the assessment and plan and placed orders.  CRITICAL CARE:  The patient is critically ill with multiple organ systems failure and requires high complexity decision making for assessment and support, frequent evaluation and titration of therapies, application of advanced monitoring technologies and extensive interpretation of multiple databases. Critical Care Time devoted to patient care services described in this note is 35 minutes.   Alyson Reedy, M.D. [facility] Pulmonary/Critical Care Medicine. Pager: [phone]. After hours pager: [phone].

## 2012-11-17 NOTE — Progress Notes (Signed)
NUTRITION FOLLOW UP  Intervention:   TPN per PharmD; Goal is Clinimix 5/15 at 83 ml/hr + Lipids MWF to provide 100 g protein/day and an average of 1620 Kcal/day. This will meet 136% of estimated calorie needs and 88% of estimated protein needs.    Nutrition Dx:   Inadequate oral intake related to inability to eat as evidenced by NPO status, vent status, and perforated bowel; ongoing  Goal:   Provide 60-70% of estimated calorie needs (22-25 kcals/kg ideal body weight) and 100% of estimated protein needs, based on ASPEN guidelines for permissive underfeeding in critically ill obese individuals; not met   Monitor:   Vent Status; on full vent support Weight; 6 lb wt increase from 5/9 to 5/13 Labs; low hemoglobin, high BUN, low albumin  Assessment:   67 yo with alcoholic liver disease was admitted on 5/7 with abdominal pain and was found on 5/8 to have a small bowel perforation. She was taken to the OR and returned to the ICU with an open wound and mechanically ventilated. Current TPN is Clinamix E 5/15 running @40  ml/hr with lipids at 10 ml/hr providing 887 kcal and 48 grams of protein; this meets 87% of estimated calorie needs and 42% of estimated protein needs. Per PA note: TNA via picc for now, once bowel function returns then we can start TFs.    Height: Ht Readings from Last 1 Encounters:  11/12/12 5\' 5"  (1.651 m)    Weight Status:   Wt Readings from Last 1 Encounters:  11/17/12 238 lb 1.6 oz (108 kg)   Patient is currently intubated on ventilator support.  MV: 7 Temp:Temp (24hrs), Avg:97.4 F (36.3 C), Min:96.2 F (35.7 C), Max:98.1 F (36.7 C)  Re-estimated needs/Nutritional Goals:  Kcal: 1022-1192 kcal Protein: 114-125 grams Fluid: 2.8 L  Skin: +2 generalized, RUE, and LUE edema, +4 RLE and LLE edema; abdominal incision  Diet Order: NPO   Intake/Output Summary (Last 24 hours) at 11/17/12 1018 Last data filed at 11/17/12 1000  Gross per 24 hour  Intake 1505.5 ml   Output   3255 ml  Net -1749.5 ml    Last BM: 5/6   Labs:   Recent Labs Lab 11/15/12 0431 11/16/12 0420 11/17/12 0440  NA 137 141 140  K 4.3 4.1 3.5  CL 106 110 107  CO2 24 27 28   BUN 27* 29* 35*  CREATININE 1.46* 1.27* 1.26*  CALCIUM 7.4* 8.2* 8.3*  MG 1.5 1.7 1.8  PHOS 3.8 3.3 3.1  GLUCOSE 128* 133* 209*    CBG (last 3)   Recent Labs  11/17/12 0010 11/17/12 0412 11/17/12 0740  GLUCAP 190* 196* 200*    Scheduled Meds: . albuterol  2.5 mg Nebulization Q6H  . antiseptic oral rinse  1 application Mouth Rinse QID  . chlorhexidine  15 mL Mouth/Throat BID  . ertapenem (INVANZ) IV  1 g Intravenous Q24H  . folic acid  1 mg Intravenous Daily  . furosemide  40 mg Intravenous Q6H  . insulin aspart  0-20 Units Subcutaneous Q4H  . lip balm  1 application Topical BID  . pantoprazole (PROTONIX) IV  40 mg Intravenous Q12H  . potassium chloride  10 mEq Intravenous Q1 Hr x 2  . thiamine  100 mg Intravenous Daily    Continuous Infusions: . sodium chloride 20 mL/hr at 11/17/12 0700  . dexmedetomidine 0.4 mcg/kg/hr (11/17/12 0830)  . Marland KitchenTPN (CLINIMIX-E) Adult 40 mL/hr at 11/17/12 0700   And  . fat emulsion 250 mL (  11/17/12 0700)  . Marland KitchenTPN (CLINIMIX-E) Adult      Ian Malkin RD, LDN Inpatient Clinical Dietitian Pager: [phone] After Hours Pager: [phone]

## 2012-11-17 NOTE — Progress Notes (Signed)
[facility] ADULT ICU REPLACEMENT PROTOCOL FOR AM LAB REPLACEMENT ONLY  The patient does apply for the [facility] Adult ICU Electrolyte Replacment Protocol based on the criteria listed below:   1. Is GFR >/= 40 ml/min? yes  Patient's GFR today is 3.5 2. Is urine output >/= 0.5 ml/kg/hr for the last 6 hours? yes Patient's UOP is 2.5 ml/kg/hr 3. Is BUN < 60 mg/dL? yes  Patient's BUN today is 35 4. Abnormal electrolyte(s): K 3.5, Mag 1.8 5. Ordered repletion with: K orderd already in place, Mag per protocol 6. If a panic level lab has been reported, has the CCM MD in charge been notified? yes.   Physician:  Dr Salvadore Farber, Chiante Peden A 11/17/2012 5:28 AM

## 2012-11-17 NOTE — Progress Notes (Signed)
Patient interviewed and examined, agree with PA note above.  Mariella Saa MD, FACS  11/17/2012 6:19 PM

## 2012-11-18 ENCOUNTER — Inpatient Hospital Stay (HOSPITAL_COMMUNITY): Payer: BC Managed Care – PPO

## 2012-11-18 LAB — GLUCOSE, CAPILLARY
Glucose-Capillary: 131 mg/dL — ABNORMAL HIGH (ref 70–99)
Glucose-Capillary: 129 mg/dL — ABNORMAL HIGH (ref 70–99)

## 2012-11-18 LAB — BLOOD GAS, ARTERIAL
Drawn by: 232811
O2 Saturation: 96.9 %
PEEP: 5 cmH2O
Patient temperature: 99.1
RATE: 14 resp/min

## 2012-11-18 LAB — BASIC METABOLIC PANEL
BUN: 31 mg/dL — ABNORMAL HIGH (ref 6–23)
Chloride: 106 mEq/L (ref 96–112)
GFR calc non Af Amer: 57 mL/min — ABNORMAL LOW (ref >90)
Glucose, Bld: 152 mg/dL — ABNORMAL HIGH (ref 70–99)
Potassium: 3.3 mEq/L — ABNORMAL LOW (ref 3.5–5.1)

## 2012-11-18 LAB — CBC
HCT: 32.4 % — ABNORMAL LOW (ref 36.0–46.0)
Hemoglobin: 10.4 g/dL — ABNORMAL LOW (ref 12.0–15.0)
MCH: 29.7 pg (ref 26.0–34.0)
MCHC: 32.1 g/dL (ref 30.0–36.0)
RDW: 20.1 % — ABNORMAL HIGH (ref 11.5–15.5)

## 2012-11-18 LAB — MAGNESIUM: Magnesium: 1.9 mg/dL (ref 1.5–2.5)

## 2012-11-18 MED ORDER — VITAMIN K1 10 MG/ML IJ SOLN
10.0000 mg | Freq: Once | INTRAVENOUS | Status: AC
  Administered 2012-11-18: 10 mg via INTRAVENOUS
  Filled 2012-11-18: qty 1

## 2012-11-18 MED ORDER — FAT EMULSION 20 % IV EMUL
250.0000 mL | INTRAVENOUS | Status: AC
  Administered 2012-11-18: 250 mL via INTRAVENOUS
  Filled 2012-11-18: qty 250

## 2012-11-18 MED ORDER — METOPROLOL TARTRATE 1 MG/ML IV SOLN
2.5000 mg | Freq: Four times a day (QID) | INTRAVENOUS | Status: DC
  Administered 2012-11-18 – 2012-11-19 (×4): 2.5 mg via INTRAVENOUS
  Filled 2012-11-18 (×8): qty 5

## 2012-11-18 MED ORDER — ACETAZOLAMIDE SODIUM 500 MG IJ SOLR
250.0000 mg | Freq: Three times a day (TID) | INTRAMUSCULAR | Status: AC
  Administered 2012-11-18 (×2): 250 mg via INTRAVENOUS
  Filled 2012-11-18 (×2): qty 500

## 2012-11-18 MED ORDER — POTASSIUM CHLORIDE 20 MEQ/15ML (10%) PO LIQD
20.0000 meq | ORAL | Status: DC
  Administered 2012-11-18: 20 meq
  Filled 2012-11-18 (×2): qty 15

## 2012-11-18 MED ORDER — MAGNESIUM SULFATE 40 MG/ML IJ SOLN
2.0000 g | Freq: Once | INTRAMUSCULAR | Status: AC
  Administered 2012-11-18: 2 g via INTRAVENOUS
  Filled 2012-11-18: qty 50

## 2012-11-18 MED ORDER — FUROSEMIDE 10 MG/ML IJ SOLN
40.0000 mg | Freq: Four times a day (QID) | INTRAMUSCULAR | Status: AC
  Administered 2012-11-18 (×3): 40 mg via INTRAVENOUS
  Filled 2012-11-18 (×3): qty 4

## 2012-11-18 MED ORDER — TRACE MINERALS CR-CU-F-FE-I-MN-MO-SE-ZN IV SOLN
INTRAVENOUS | Status: AC
  Administered 2012-11-18: 17:00:00 via INTRAVENOUS
  Filled 2012-11-18: qty 2000

## 2012-11-18 MED ORDER — POTASSIUM CHLORIDE 10 MEQ/50ML IV SOLN
10.0000 meq | INTRAVENOUS | Status: AC
  Administered 2012-11-18 (×4): 10 meq via INTRAVENOUS
  Filled 2012-11-18: qty 200

## 2012-11-18 NOTE — Progress Notes (Signed)
Patient ID: [name] [name], female   DOB: 1945-11-28, 67 y.o.   MRN: [id number] 4 Days Post-Op  Subjective: Sedated on vent  Objective: Vital signs in last 24 hours: Temp:  [96.2 F (35.7 C)-99.3 F (37.4 C)] 99.1 F (37.3 C) (05/14 0400) Pulse Rate:  [65-112] 80 (05/14 0755) Resp:  [9-17] 17 (05/14 0755) BP: (103-180)/(60-100) 159/76 mmHg (05/14 0755) SpO2:  [92 %-100 %] 100 % (05/14 0755) Arterial Line BP: (97-177)/(55-95) 143/66 mmHg (05/14 0600) FiO2 (%):  [40 %-60 %] 40 % (05/14 0755) Weight:  [229 lb 8 oz (104.1 kg)] 229 lb 8 oz (104.1 kg) (05/14 0500) Last BM Date: 11/10/12  Intake/Output from previous day: 05/13 0701 - 05/14 0700 In: 2071.7 [I.V.:728.4; IV Piggyback:150; TPN:1193.3] Out: 5065 [Urine:5055; Drains:10] Intake/Output this shift:    General appearance: no distress and sedated Resp: clear to auscultation bilaterally GI: normal findings: soft, non-tender Incision/Wound: VAC clean and in place  Lab Results:   Recent Labs  11/17/12 0440 11/18/12 0445  WBC 7.7 10.7*  HGB 10.2* 10.4*  HCT 31.4* 32.4*  PLT 113* 100*   BMET  Recent Labs  11/17/12 0440 11/18/12 0445  NA 140 142  K 3.5 3.3*  CL 107 106  CO2 28 32  GLUCOSE 209* 152*  BUN 35* 31*  CREATININE 1.26* 1.02  CALCIUM 8.3* 8.3*     Studies/Results: Dg Chest Port 1 View  11/18/2012   *RADIOLOGY REPORT*  Clinical Data: Endotracheal tube placement  PORTABLE CHEST - 1 VIEW  Comparison: 11/17/2012  Findings: 0453 hours.  Endotracheal tube tip is 3.2 cm above the base of the carina.  The left IJ central venous catheter tip overlies the mid to lower SVC.  Lung volumes are low. The cardiopericardial silhouette is enlarged.  Vascular congestion noted without overt airspace pulmonary edema.  There is slight improvement in basilar lung aeration. The NG tube passes into the stomach although the distal tip position is not included on the film. Telemetry leads overlie the chest.  IMPRESSION: Low  volume film with cardiomegaly vascular congestion.  Slight improvement of bibasilar collapse / consolidation.   Original Report Authenticated By: Kennith Center, M.D.   Dg Chest Port 1 View  11/17/2012   *RADIOLOGY REPORT*  Clinical Data: Status post repair of small bowel perforation. Ventilated patient.  PORTABLE CHEST - 1 VIEW  Comparison: Chest 11/16/2012.  Findings: Support tubes and lines are unchanged.  Bibasilar airspace opacities persist and appear worsened on the right.  No pneumothorax identified.  Heart size is normal.  IMPRESSION: Some worsening of airspace disease in the right lung base.  No change on the left.   Original Report Authenticated By: Holley Dexter, M.D.    Anti-infectives: Anti-infectives   Start     Dose/Rate Route Frequency Ordered Stop   11/13/12 1500  ertapenem (INVANZ) 1 g in sodium chloride 0.9 % 50 mL IVPB     1 g 100 mL/hr over 30 Minutes Intravenous Every 24 hours 11/12/12 1758     11/13/12 1000  Levofloxacin (LEVAQUIN) IVPB 250 mg  Status:  Discontinued     250 mg 50 mL/hr over 60 Minutes Intravenous Every 24 hours 11/12/12 0953 11/12/12 1259   11/12/12 2200  ciprofloxacin (CIPRO) IVPB 400 mg  Status:  Discontinued     400 mg 200 mL/hr over 60 Minutes Intravenous Every 12 hours 11/12/12 1308 11/12/12 1747   11/12/12 1830  aztreonam (AZACTAM) 1 g in dextrose 5 % 50 mL IVPB  Status:  Discontinued     1 g 100 mL/hr over 30 Minutes Intravenous Every 8 hours 11/12/12 1811 11/13/12 0931   11/12/12 1446  ertapenem (INVANZ) 1 g in sodium chloride 0.9 % 50 mL IVPB  Status:  Discontinued    Comments:  stat   1 g 100 mL/hr over 30 Minutes Intravenous 60 min pre-op 11/12/12 1444 11/12/12 1700   11/12/12 1400  metroNIDAZOLE (FLAGYL) IVPB 500 mg  Status:  Discontinued     500 mg 100 mL/hr over 60 Minutes Intravenous Every 8 hours 11/12/12 1308 11/13/12 0911   11/12/12 1000  levofloxacin (LEVAQUIN) IVPB 500 mg     500 mg 100 mL/hr over 60 Minutes Intravenous  Once  11/12/12 0942 11/12/12 1157      Assessment/Plan: s/p Procedure(s): Washout Abdominal Wound, debridement of Fascia, Partial Closure Stable/improving.  Slow vent wean in progress Slight fever and minimal elevated WBC but abdomen benign, observe closely FEN- diuresing well, on TNA   LOS: 7 days    Serai Tukes T 11/18/2012

## 2012-11-18 NOTE — Progress Notes (Signed)
CARE MANAGEMENT NOTE 11/18/2012  Patient:  [name] [name], [name] [name]   Account Number:  [id number]  Date Initiated:  11/13/2012  Documentation initiated by:  DAVIS,RHONDA  Subjective/Objective Assessment:   pt with perforated bowel, requiring surgical intervention, unable to support resp on vent post op     Action/Plan:   tbd based on progress   Anticipated DC Date:  11/21/2012   Anticipated DC Plan:  HOME/SELF CARE  In-house referral  NA      DC Planning Services  NA      [facility] Choice  NA   Choice offered to / List presented to:  NA   DME arranged  NA      DME agency  NA     HH arranged  NA      HH agency  NA   Status of service:  In process, will continue to follow Medicare Important Message given?  NA - LOS <3 / Initial given by admissions (If response is "NO", the following Medicare IM given date fields will be blank) Date Medicare IM given:   Date Additional Medicare IM given:    Discharge Disposition:    Per UR Regulation:  Reviewed for med. necessity/level of care/duration of stay  If discussed at Long Length of Stay Meetings, dates discussed:   11/17/2012    Comments:  [id number] Earlene Plater, RN, BSN, CCM:  CHART REVIEWED AND UPDATED.  Next chart review due on [id number]. NO DISCHARGE NEEDS PRESENT AT THIS TIME. pt remains on the vent at full support, open abd wound and vac. CASE MANAGEMENT [phone]   [id number] Davis,RN,BSN,CCM: Patient returned to or on [id number], post-op resp failure, open abd wound, on vent-intubated. [id number] Earlene Plater, RN, BSN, CCM:  CHART REVIEWED AND UPDATED.  Next chart review due on [id number]. NO DISCHARGE NEEDS PRESENT AT THIS TIME. CASE MANAGEMENT [phone]

## 2012-11-18 NOTE — Progress Notes (Addendum)
PARENTERAL NUTRITION CONSULT NOTE   Pharmacy Consult for TPN Indication: Intolerance to enteral feeding, s/p repair of bowel perforation  Allergies  Allergen Reactions  . Cephalexin Rash  . Penicillins Rash    Patient Measurements: Height: 5\' 5"  (165.1 cm) Weight: 229 lb 8 oz (104.1 kg) IBW/kg (Calculated) : 57  Vital Signs: Temp: 99.1 F (37.3 C) (05/14 0400) Temp src: Oral (05/14 0400) BP: 132/63 mmHg (05/14 0429) Pulse Rate: 83 (05/14 0429) Intake/Output from previous day: 05/13 0701 - 05/14 0700 In: 2071.7 [I.V.:728.4; IV Piggyback:150; TPN:1193.3] Out: 5065 [Urine:5055; Drains:10]  Labs:  Recent Labs  11/16/12 0420 11/16/12 1200 11/17/12 0440 11/18/12 0445  WBC 8.4  --  7.7 10.7*  HGB 9.7*  --  10.2* 10.4*  HCT 30.4*  --  31.4* 32.4*  PLT 124*  --  113* 100*  INR  --  1.88*  --  2.09*     Recent Labs  11/16/12 0420 11/17/12 0440 11/18/12 0445  NA 141 140 142  K 4.1 3.5 3.3*  CL 110 107 106  CO2 27 28 32  GLUCOSE 133* 209* 152*  BUN 29* 35* 31*  CREATININE 1.27* 1.26* 1.02  CALCIUM 8.2* 8.3* 8.3*  MG 1.7 1.8 1.9  PHOS 3.3 3.1 3.1  PROT  --  5.8*  --   ALBUMIN  --  1.6*  --   AST  --  92*  --   ALT  --  40*  --   ALKPHOS  --  114  --   BILITOT  --  2.9*  --   PREALBUMIN  --  6.9*  --   TRIG  --  119  --   CHOL  --  97  --    Estimated Creatinine Clearance: 66.7 ml/min (by C-G formula based on Cr of 1.02).    Recent Labs  11/17/12 1610 11/17/12 1939 11/17/12 2357  GLUCAP 158* 156* 162*    Nutritional Goals:   RD recs (5/13): Kcal 1610-9604, Protein 114-125g/d  Per RD, meet lower caloric needs (OK to NOT meet protein needs)  Clinimix 5/15 at 60 ml/hr + Lipids MWF to provide 72 g protein/day and an average of  1228 Kcal/day.  (provides 103% of caloric needs and 63% of protein needs)  Current nutrition:   Diet: NPO  Enteral feeding: started 5/10, stopped 5/11  IVF: NS at [facility]  CBGs & Insulin requirements past 24 hours:    CBGs:  156-200  Resistant SSI: 20 units/ 24hrs  Assessment:   67 yo F admit 5/7 with abdominal pain and on 5/8 went to OR for exp laparatomy with repair of perforated small bowel and abdominal wall debridement.  Due to diffuse peritonitis, wound closure was delayed; pt was returned to OR on 5/10 for peritoneal washout, debridement, abdominal wall closure.  Enteral feeding goal is Vital AF 1.2 cal at 30 ml/hr.  Tube feeds were started 5/10, but d/c on 5/12 d/t high residuals ( ).  Per notes, planning to resume TF once bowel function returns.  Renal/hepatic function: SCr is improving, AST/ALT elevated (Hx of alcoholic cirrhosis).   Electrolytes: K low, bolus ordered per Elink via tube (change to IV since expect little absorption).  Other elytes wnl.  Corrected Ca 10.2  Pre-Albumin: 6.9 (5/13)  TG/Cholesterol: wnl (5/13)  Glucose:  Initially elevated after starting TNA 5/12, but greatly improved 5/13  Plan:   Continue Clinimix E 5/15 at 60 ml/hr Continue 10 units/L regular insulin to TPN (20 units in 2L  bag) Fat emulsion at 10 ml/hr (MWF only due to ongoing shortage). TNA to contain standard multivitamins and trace elements (MWF only due to ongoing shortage). Continue IVF at 10-20 ml/hr. Continue resistant SSI q4h. TNA lab panels on Mondays & Thursdays. KCl 10 meq IV bolus x4 runs.   Lynann Beaver PharmD, BCPS Pager [phone] 11/18/2012 8:39 AM

## 2012-11-18 NOTE — Progress Notes (Signed)
PULMONARY  / CRITICAL CARE MEDICINE  Name: [name] [name] MRN: [id number] DOB: 08/25/1945    ADMISSION DATE:  11/11/2012 CONSULTATION DATE:  11/12/2012  REFERRING MD :  CCS PRIMARY SERVICE:  PCCM  CHIEF COMPLAINT:  Post op respiratory failure  BRIEF PATIENT DESCRIPTION: 67 yo with alcoholic liver disease was admitted on 5/7 with abdominal pain and was found on 5/8 to have a small bowel perforation.  She was taken to the OR and returned to the ICU with an open wound and mechanically ventilated.   SIGNIFICANT EVENTS / STUDIES:  5/7  Admitted with abdominal pain, nausea, vomiting 5/7  OR >>> Perforated viscus, peritonitis, lysis of adhesions, small bowel repair  LINES / TUBES: OETT 5/8 >>> OGT 5/8 >>> Foley 5/8 >>> R rad A-line 5/8 >>> L IJ CVL 5/8 >>  CULTURES: 5/8 Blood >>>None 5/7 Urine >>>neg   ANTIBIOTICS: Aztreonam 5/8 >>>5/9 Flagyl 5/8 >>>5/9 Levaquin 5/8 x 1 Cipro 5/8 x 1 ertapenem 5/9>>>  Interval events: Weaning   VITAL SIGNS: Temp:  [96.2 F (35.7 C)-101 F (38.3 C)] 101 F (38.3 C) (05/14 0800) Pulse Rate:  [65-112] 94 (05/14 0825) Resp:  [9-20] 20 (05/14 0825) BP: (103-180)/(60-100) 142/77 mmHg (05/14 0825) SpO2:  [92 %-100 %] 100 % (05/14 0825) Arterial Line BP: (97-177)/(55-95) 165/78 mmHg (05/14 0825) FiO2 (%):  [40 %-60 %] 40 % (05/14 0825) Weight:  [104.1 kg (229 lb 8 oz)] 104.1 kg (229 lb 8 oz) (05/14 0500)  HEMODYNAMICS: CVP:  [11 mmHg-18 mmHg] 11 mmHg VENTILATOR SETTINGS: Vent Mode:  [-] CPAP;PSV FiO2 (%):  [40 %-60 %] 40 % Set Rate:  [14 bmp] 14 bmp Vt Set:  [450 mL-500 mL] 450 mL PEEP:  [5 cmH20] 5 cmH20 Pressure Support:  [8 cmH20] 8 cmH20 Plateau Pressure:  [14 cmH20-27 cmH20] 20 cmH20 INTAKE / OUTPUT: Intake/Output     05/13 0701 - 05/14 0700 05/14 0701 - 05/15 0700   I.V. (mL/kg) 728.4 (7)    NG/GT     IV Piggyback 150    TPN 1193.3    Total Intake(mL/kg) 2071.7 (19.9)    Urine (mL/kg/hr) 5055 (2)    Drains 10 (0)    Total Output 5065     Net -2993.3           PHYSICAL EXAMINATION: General:  Sedated on vent, arousable but not purposeful.  HEENT: NCAT, minimal sleral icterus noted, PERRL, ETT, OG tubes in place PULM: Scattered rhonchi, exp wheeze CV: RRR, no mgr AB: Central wound vac in place, no bowel sounds Ext: Anasarca improving, SCD's in place Neuro: arouses to voice, does not follow commands, moves all four ext   LABS:  Recent Labs Lab 11/12/12 1752 11/13/12 0415 11/14/12 0359 11/15/12 0932 11/18/12 0455  PHART 7.293* 7.365 7.406 7.354 7.516*  PCO2ART 43.5 39.4 37.5 43.9 39.1  PO2ART 261.0* 94.0 73.0* 85.6 86.5  HCO3 20.4 22.0 23.1 23.8 31.3*  TCO2 19.6 20.8 22.1 22.6 28.3  O2SAT 99.3 97.3 94.6 96.3 96.9     Recent Labs Lab 11/16/12 0420 11/17/12 0440 11/18/12 0445  NA 141 140 142  K 4.1 3.5 3.3*  CL 110 107 106  CO2 27 28 32  BUN 29* 35* 31*  CREATININE 1.27* 1.26* 1.02  GLUCOSE 133* 209* 152*    Recent Labs Lab 11/16/12 0420 11/17/12 0440 11/18/12 0445  HGB 9.7* 10.2* 10.4*  HCT 30.4* 31.4* 32.4*  WBC 8.4 7.7 10.7*  PLT 124* 113* 100*   Lab Results  Component Value Date   INR 2.09* 11/18/2012   INR 1.88* 11/16/2012   INR 2.05* 11/14/2012    Recent Labs Lab 11/17/12 1610 11/17/12 1939 11/17/12 2357 11/18/12 0437 11/18/12 0739  GLUCAP 158* 156* 162* 153* 147*   CXR: 5/12: Left > right airspace disease. Prob a mix of ALI and volume   ASSESSMENT / PLAN:  PULMONARY A:  Acute post operative respiratory failure.  At risk for TRALI (multiple transfusions). >CXR w/ L > R LL ATX vs infiltrate. No sig change radiographically. She has sig accessory muscle use w/ forced exhale phase.  P:   - Diurese as ordered. - Added BDs. - PS as tolerated, no extubation until more diureses and mental status is improved, would imagine will need trach. - F/u cxr.  CARDIOVASCULAR A: Sepsis / septic shock secondary to peritonitis.-->resolved  P:  - Now working on volume  removal. - D/c hydrocort (random was 69.9).  RENAL A:  AKI, improving P:   - Lasix as ordered for anasarca. - F/u chem. - Close obs of lytes while being diuresed.  GASTROINTESTINAL A:  Alcoholic cirrhosis.  Acute alcoholic hepatitis.  Perforated viscus in jejunem s/p repair 5/8 P:   - CCS managing. - TF not tolerated, TPN started, minimal K in TPN. - Protonix for GI Px. - Wound vac in place, will be managed bedside at this point.  HEMATOLOGIC A:  Coagulopathy (presume r/t liver disease). Significant intraoperative bleeding controlled with platelets and plasma.       Post-op anemia: hgb holding s/p transfusions P:  - INR goal < 2, 1.88 yesterday. - Repeat PT/INR in AM.  INFECTIOUS  Recent Labs Lab 11/12/12 1757 11/13/12 0430 11/14/12 0400  PROCALCITON 2.51 2.78 1.54   A:  Peritonitis.  PCN / Cephalosporin allergy.  P:   - Cultures and antibiotics as per flowsheet above. - See dashboard.  ENDOCRINE  A:  Hypoglycemia. P:   - ISS.  NEUROLOGIC A:  Acute encephalopathy. H/o Alcohol abuse. Post op pain. At risk for alcohol withdrawal. P:   - Goal RASS  -1 to -2. - Thiamin / Folate. - Precedex and PRN fentanyl.  TODAY'S SUMMARY: 67 y/o female with alcoholic cirrhosis admitted 5/7 and taken to the OR on 5/8 for a perforated jejunem.  If no improvement by AM will need to consider tracheostomy.  Spoke with the husband extensively today, recommended trach, he is to discuss with family and let us know.  I have personally obtained a history, examined the patient, evaluated laboratory and imaging results, formulated the assessment and plan and placed orders.  CRITICAL CARE:  The patient is critically ill with multiple organ systems failure and requires high complexity decision making for assessment and support, frequent evaluation and titration of therapies, application of advanced monitoring technologies and extensive interpretation of multiple databases. Critical Care Time  devoted to patient care services described in this note is 35 minutes.   Alyson Reedy, M.D. [facility] Pulmonary/Critical Care Medicine. Pager: [phone]. After hours pager: [phone].

## 2012-11-18 NOTE — Progress Notes (Signed)
[facility] ADULT ICU REPLACEMENT PROTOCOL FOR AM LAB REPLACEMENT ONLY  The patient does apply for the [facility] Adult ICU Electrolyte Replacment Protocol based on the criteria listed below:   1. Is GFR >/= 40 ml/min? yes  Patient's GFR today is 57 2. Is urine output >/= 0.5 ml/kg/hr for the last 6 hours? yes Patient's UOP is 1.7 ml/kg/hr 3. Is BUN < 60 mg/dL? yes  Patient's BUN today is 31 4. Abnormal electrolyte(s): K 3.3 5. Ordered repletion with: per protocol 6. If a panic level lab has been reported, has the CCM MD in charge been notified? yes.   Physician:  Dr Salvadore Farber, Contrina Orona A 11/18/2012 6:03 AM

## 2012-11-19 ENCOUNTER — Inpatient Hospital Stay (HOSPITAL_COMMUNITY): Payer: BC Managed Care – PPO

## 2012-11-19 LAB — COMPREHENSIVE METABOLIC PANEL
AST: 132 U/L — ABNORMAL HIGH (ref 0–37)
Albumin: 1.6 g/dL — ABNORMAL LOW (ref 3.5–5.2)
Chloride: 105 mEq/L (ref 96–112)
Creatinine, Ser: 1.02 mg/dL (ref 0.50–1.10)
Potassium: 2.8 mEq/L — ABNORMAL LOW (ref 3.5–5.1)
Total Bilirubin: 3.4 mg/dL — ABNORMAL HIGH (ref 0.3–1.2)
Total Protein: 6.3 g/dL (ref 6.0–8.3)

## 2012-11-19 LAB — GLUCOSE, CAPILLARY
Glucose-Capillary: 125 mg/dL — ABNORMAL HIGH (ref 70–99)
Glucose-Capillary: 130 mg/dL — ABNORMAL HIGH (ref 70–99)
Glucose-Capillary: 134 mg/dL — ABNORMAL HIGH (ref 70–99)
Glucose-Capillary: 135 mg/dL — ABNORMAL HIGH (ref 70–99)
Glucose-Capillary: 154 mg/dL — ABNORMAL HIGH (ref 70–99)

## 2012-11-19 LAB — URINALYSIS, ROUTINE W REFLEX MICROSCOPIC
Glucose, UA: NEGATIVE mg/dL
Hgb urine dipstick: NEGATIVE
Specific Gravity, Urine: 1.017 (ref 1.005–1.030)
pH: 8 (ref 5.0–8.0)

## 2012-11-19 LAB — BLOOD GAS, ARTERIAL
Acid-Base Excess: 7.3 mmol/L — ABNORMAL HIGH (ref 0.0–2.0)
Bicarbonate: 31.7 mEq/L — ABNORMAL HIGH (ref 20.0–24.0)
FIO2: 0.4 %
MECHVT: 0.45 mL
Patient temperature: 99.6
TCO2: 28.8 mmol/L (ref 0–100)
pH, Arterial: 7.447 (ref 7.350–7.450)

## 2012-11-19 LAB — BASIC METABOLIC PANEL
Calcium: 8.3 mg/dL — ABNORMAL LOW (ref 8.4–10.5)
Chloride: 104 mEq/L (ref 96–112)
Creatinine, Ser: 0.96 mg/dL (ref 0.50–1.10)
GFR calc Af Amer: 71 mL/min — ABNORMAL LOW (ref >90)
Sodium: 142 mEq/L (ref 135–145)

## 2012-11-19 LAB — CBC
HCT: 33.8 % — ABNORMAL LOW (ref 36.0–46.0)
Hemoglobin: 10.8 g/dL — ABNORMAL LOW (ref 12.0–15.0)
MCHC: 32 g/dL (ref 30.0–36.0)
RBC: 3.62 MIL/uL — ABNORMAL LOW (ref 3.87–5.11)

## 2012-11-19 LAB — PHOSPHORUS: Phosphorus: 4.2 mg/dL (ref 2.3–4.6)

## 2012-11-19 MED ORDER — IOHEXOL 300 MG/ML  SOLN
100.0000 mL | Freq: Once | INTRAMUSCULAR | Status: AC | PRN
  Administered 2012-11-19: 100 mL via INTRAVENOUS

## 2012-11-19 MED ORDER — BENEPROTEIN PO POWD
12.0000 g | Freq: Four times a day (QID) | ORAL | Status: DC
  Filled 2012-11-19: qty 227

## 2012-11-19 MED ORDER — PRO-STAT SUGAR FREE PO LIQD: 30.0000 mL | Freq: Three times a day (TID) | ORAL | Status: DC

## 2012-11-19 MED ORDER — VANCOMYCIN HCL 10 G IV SOLR
1250.0000 mg | Freq: Two times a day (BID) | INTRAVENOUS | Status: DC
  Administered 2012-11-19 – 2012-11-20 (×4): 1250 mg via INTRAVENOUS
  Filled 2012-11-19 (×5): qty 1250

## 2012-11-19 MED ORDER — FUROSEMIDE 10 MG/ML IJ SOLN
40.0000 mg | Freq: Four times a day (QID) | INTRAMUSCULAR | Status: AC
  Administered 2012-11-19 (×3): 40 mg via INTRAVENOUS
  Filled 2012-11-19 (×3): qty 4

## 2012-11-19 MED ORDER — FENTANYL CITRATE 0.05 MG/ML IJ SOLN
25.0000 ug | INTRAMUSCULAR | Status: DC | PRN
  Filled 2012-11-19: qty 2

## 2012-11-19 MED ORDER — RESOURCE INSTANT PROTEIN PO PWD PACKET
12.0000 g | Freq: Four times a day (QID) | ORAL | Status: DC
  Administered 2012-11-19 – 2012-11-23 (×15): 12 g via ORAL
  Filled 2012-11-19 (×23): qty 12

## 2012-11-19 MED ORDER — POTASSIUM CHLORIDE 10 MEQ/50ML IV SOLN
10.0000 meq | INTRAVENOUS | Status: AC
  Administered 2012-11-19 (×8): 10 meq via INTRAVENOUS
  Filled 2012-11-19 (×2): qty 50
  Filled 2012-11-19: qty 300

## 2012-11-19 MED ORDER — POTASSIUM CHLORIDE CRYS ER 20 MEQ PO TBCR: 40.0000 meq | EXTENDED_RELEASE_TABLET | Freq: Three times a day (TID) | ORAL | Status: DC

## 2012-11-19 MED ORDER — LABETALOL HCL 5 MG/ML IV SOLN
10.0000 mg | Freq: Once | INTRAVENOUS | Status: AC
  Administered 2012-11-19: 10 mg via INTRAVENOUS
  Filled 2012-11-19: qty 4

## 2012-11-19 MED ORDER — IOHEXOL 300 MG/ML  SOLN
25.0000 mL | INTRAMUSCULAR | Status: AC
  Administered 2012-11-19 (×2): 25 mL via ORAL

## 2012-11-19 MED ORDER — INSULIN REGULAR HUMAN 100 UNIT/ML IJ SOLN
INTRAVENOUS | Status: AC
  Administered 2012-11-19: 18:00:00 via INTRAVENOUS
  Filled 2012-11-19: qty 2000

## 2012-11-19 MED ORDER — BENEPROTEIN PO POWD
1.0000 | Freq: Three times a day (TID) | ORAL | Status: DC
  Filled 2012-11-19: qty 227

## 2012-11-19 MED ORDER — POTASSIUM CHLORIDE 20 MEQ/15ML (10%) PO LIQD
40.0000 meq | Freq: Three times a day (TID) | ORAL | Status: AC
  Administered 2012-11-19 (×2): 40 meq via ORAL
  Filled 2012-11-19 (×2): qty 30

## 2012-11-19 MED ORDER — LABETALOL HCL 100 MG PO TABS
100.0000 mg | ORAL_TABLET | Freq: Three times a day (TID) | ORAL | Status: DC
  Administered 2012-11-19 – 2012-11-30 (×24): 100 mg via ORAL
  Filled 2012-11-19 (×36): qty 1

## 2012-11-19 MED ORDER — RESOURCE INSTANT PROTEIN PO PWD PACKET
1.0000 | Freq: Three times a day (TID) | ORAL | Status: DC
  Administered 2012-11-19: 6 g via ORAL
  Filled 2012-11-19 (×3): qty 6

## 2012-11-19 NOTE — Progress Notes (Signed)
ANTIBIOTIC CONSULT NOTE - INITIAL  Pharmacy Consult for Vancomycin Indication: pneumonia  Allergies  Allergen Reactions  . Cephalexin Rash  . Penicillins Rash    Patient Measurements: Height: 5\' 5"  (165.1 cm) Weight: 220 lb 3.8 oz (99.9 kg) IBW/kg (Calculated) : 57  Vital Signs: Temp: 99.6 F (37.6 C) (05/15 0400) Temp src: Axillary (05/15 0400) BP: 142/70 mmHg (05/15 0418) Pulse Rate: 125 (05/15 0418) Intake/Output from previous day: 05/14 0701 - 05/15 0700 In: 2408.5 [I.V.:508.5; IV Piggyback:400; TPN:1500] Out: 5935 [Urine:5925; Drains:10]  Labs:  Recent Labs  11/17/12 0440 11/18/12 0445 11/19/12 0409  WBC 7.7 10.7* 22.8*  HGB 10.2* 10.4* 10.8*  PLT 113* 100* 121*  CREATININE 1.26* 1.02 1.02   Estimated Creatinine Clearance: 65.3 ml/min (by C-G formula based on Cr of 1.02).  Microbiology: Recent Results (from the past 720 hour(s))  URINE CULTURE     Status: None   Collection Time    11/11/12  9:55 PM      Result Value Range Status   Specimen Description URINE, CATHETERIZED   Final   Special Requests NONE   Final   Culture  Setup Time 11/12/2012 06:03   Final   Colony Count NO GROWTH   Final   Culture NO GROWTH   Final   Report Status 11/13/2012 FINAL   Final  MRSA PCR SCREENING     Status: None   Collection Time    11/12/12  1:28 PM      Result Value Range Status   MRSA by PCR NEGATIVE  NEGATIVE Final   Comment:            The GeneXpert MRSA Assay (FDA     approved for NASAL specimens     only), is one component of a     comprehensive MRSA colonization     surveillance program. It is not     intended to diagnose MRSA     infection nor to guide or     monitor treatment for     MRSA infections.    Anti-infectives: 5/8 >> Levaquin >> 5/9 5/8 >> Metronidazole >> 5/9 5/8 >> Aztreonam >> 5/9 5/9 >> Ertapenem >>  5/15 >> Vanc >>  Assessment: 67 yo obese Female with perforated viscus, peritonitis s/p expl lap with lysis of adhesions, repair of  perforation on 5/8. Remained intubated post-op, considering trach if unable to tolerate extubation.  Prior hx of cirrhosis from ETOH abuse.  Currently on Day #6 Ertapenem for peritonitis and pharmacy asked to add Vancomycin for possible pneumonia.  WBC acutely increased to 22  SCr is improved/stable, with CrCl ~ 65 ml/min  Blood, urine, and sputum cultures ordered today.  Goal of Therapy:  Vancomycin trough level 15-20 mcg/ml  Plan:   Vancomycin 1250mg  IV q12h.  Measure Vanc trough at steady state.  Follow up renal fxn and culture results.   Lynann Beaver PharmD, BCPS Pager [phone] 11/19/2012 9:26 AM

## 2012-11-19 NOTE — Progress Notes (Addendum)
PARENTERAL NUTRITION CONSULT NOTE   Pharmacy Consult for TPN Indication: Intolerance to enteral feeding, s/p repair of bowel perforation  Allergies  Allergen Reactions  . Cephalexin Rash  . Penicillins Rash    Patient Measurements: Height: 5\' 5"  (165.1 cm) Weight: 220 lb 3.8 oz (99.9 kg) IBW/kg (Calculated) : 57  Vital Signs: Temp: 99.6 F (37.6 C) (05/15 0400) Temp src: Axillary (05/15 0400) BP: 142/70 mmHg (05/15 0418) Pulse Rate: 125 (05/15 0418) Intake/Output from previous day: 05/14 0701 - 05/15 0700 In: 2358.5 [I.V.:508.5; IV Piggyback:350; TPN:1500] Out: 5935 [Urine:5925; Drains:10]  Labs:  Recent Labs  11/16/12 1200 11/17/12 0440 11/18/12 0445 11/19/12 0409  WBC  --  7.7 10.7* 22.8*  HGB  --  10.2* 10.4* 10.8*  HCT  --  31.4* 32.4* 33.8*  PLT  --  113* 100* 121*  INR 1.88*  --  2.09*  --      Recent Labs  11/17/12 0440 11/18/12 0445 11/19/12 0409  NA 140 142 145  K 3.5 3.3* 2.8*  CL 107 106 105  CO2 28 32 33*  GLUCOSE 209* 152* 118*  BUN 35* 31* 28*  CREATININE 1.26* 1.02 1.02  CALCIUM 8.3* 8.3* 8.1*  MG 1.8 1.9 2.0  PHOS 3.1 3.1 4.2  PROT 5.8*  --  6.3  ALBUMIN 1.6*  --  1.6*  AST 92*  --  132*  ALT 40*  --  53*  ALKPHOS 114  --  135*  BILITOT 2.9*  --  3.4*  PREALBUMIN 6.9*  --   --   TRIG 119  --   --   CHOL 97  --   --    Estimated Creatinine Clearance: 65.3 ml/min (by C-G formula based on Cr of 1.02).    Recent Labs  11/18/12 2018 11/19/12 0026 11/19/12 0306  GLUCAP 129* 130* 134*    Nutritional Goals:   RD recs (5/13): Kcal 1610-9604, Protein 114-125g/d  Per RD, meet lower caloric needs (OK to NOT meet protein needs)  Clinimix 5/15 at 60 ml/hr + Lipids MWF to provide 72 g protein/day and an average of  1228 Kcal/day.  (provides 103% of caloric needs and 63% of protein needs)  Current nutrition:   Diet: NPO  Enteral feeding: started 5/10, stopped 5/11  IVF: NS at [facility]  CBGs & Insulin requirements past 24 hours:    CBGs:  125-165  Resistant SSI: 19 units/ 24hrs  Assessment:   67 yo F admit 5/7 with abdominal pain and on 5/8 went to OR for exp laparatomy with repair of perforated small bowel and abdominal wall debridement.  Due to diffuse peritonitis, wound closure was delayed; pt was returned to OR on 5/10 for peritoneal washout, debridement, abdominal wall closure.  Enteral feeding goal is Vital AF 1.2 cal at 30 ml/hr.  Tube feeds were started 5/10, but d/c on 5/12 d/t high residuals ( ).  Per notes, planning to resume TF once bowel function returns.  Renal/hepatic function: SCr is improved.  AST/ALT, alk phos are elevated and increasing. (Hx of alcoholic cirrhosis).   Electrolytes: K remains low (despite enteral and IV KCl supplements.  Lasix x3 doses given.)  Replacement today via Elink is KCl IV x8 runs.  Other elytes wnl.  Corrected Ca 10.  Pre-Albumin: 6.9 (5/13)  TG/Cholesterol: wnl (5/13)  Glucose:  Improved to w/in goal < 150 except one value.  Plan:   Continue Clinimix E 5/15 at 60 ml/hr Continue 10 units/L regular insulin to TPN (20  units in 2L bag) Fat emulsion at 10 ml/hr (MWF only due to ongoing shortage). TNA to contain standard multivitamins and trace elements (MWF only due to ongoing shortage). Continue IVF at 10-20 ml/hr. Continue resistant SSI q4h. TNA lab panels on Mondays & Thursdays.   Lynann Beaver PharmD, BCPS Pager [phone] 11/19/2012 8:26 AM    Addendum:  Rip [place] per CCM to use feeding tube for protein powder to supplement nutrition. Plan:  Decrease Clinimix E 5/15 to 50 ml/hr to provide 1058 kcal and 60g protein  Add Beneprotein 2 packets VT 4 times daily to provide 200 kcal and 48g protein.  Together, this will provide 1258 kcal and 108 grams of protein and will meet 106% of estimated calorie needs and 95% of estimated protein needs.   Lynann Beaver PharmD, BCPS Pager [phone] 11/19/2012 1:13 PM

## 2012-11-19 NOTE — Progress Notes (Signed)
[facility] ADULT ICU REPLACEMENT PROTOCOL FOR AM LAB REPLACEMENT ONLY  The patient {does apply for the [facility] Adult ICU Electrolyte Replacment Protocol based on the criteria listed below:   1. Is GFR >/= 40 ml/min? yes  Patient's GFR today is 57 2. Is urine output >/= 0.5 ml/kg/hr for the last 6 hours? yes Patient's UOP is 2.67 ml/kg/hr 3. Is BUN < 60 mg/dL? yes  Patient's BUN today is 28 4. Abnormal electrolyte(s): k 2.8 5. Ordered repletion with: order replaced see protocol 6. If a panic level lab has been reported, has the CCM MD in charge been notified? yes.   Physician:  Dr. Higinio Plan, Amal Saiki A 11/19/2012 6:32 AM

## 2012-11-19 NOTE — Procedures (Signed)
Extubation Procedure Note  Patient Details:   Name: LILLYANN AHART DOB: 1946/04/27 MRN: [id number]   Airway Documentation:  AIRWAYS 7.5 mm (Active)    Evaluation  O2 sats: stable throughout Complications: No apparent complications Patient did tolerate procedure well. Bilateral Breath Sounds: Expiratory wheezes Suctioning: Airway Yes, Pt placed on 4 lpm Bulger, Sat 95%.   Mercy Riding, Janai Brannigan L 11/19/2012, 3:54 PM

## 2012-11-19 NOTE — Progress Notes (Signed)
Patient  examined, agree with PA note above. We have obtained a CT scan of the abdomen and pelvis with contrast and there is no evidence of leak or abscess. Contrast has progressed through to the colon. There does appear to be consolidation, possible pneumonia bilaterally which could explain the fever and white count. Her abdomen seems a little more tender today but she is much more alert which could account for this and she does not have apparent peritonitis on exam. I do not see a surgical problem in her abdomen currently. This was discussed with her family.  Mariella Saa MD, FACS  11/19/2012 1:48 PM

## 2012-11-19 NOTE — Progress Notes (Signed)
PULMONARY  / CRITICAL CARE MEDICINE  Name: [name] [name] MRN: [id number] DOB: 03-Dec-1945    ADMISSION DATE:  11/11/2012 CONSULTATION DATE:  11/12/2012  REFERRING MD :  CCS PRIMARY SERVICE:  PCCM  CHIEF COMPLAINT:  Post op respiratory failure  BRIEF PATIENT DESCRIPTION: 67 yo with alcoholic liver disease was admitted on 5/7 with abdominal pain and was found on 5/8 to have a small bowel perforation.  She was taken to the OR and returned to the ICU with an open wound and mechanically ventilated.   SIGNIFICANT EVENTS / STUDIES:  5/7  Admitted with abdominal pain, nausea, vomiting 5/7  OR >>> Perforated viscus, peritonitis, lysis of adhesions, small bowel repair  LINES / TUBES: OETT 5/8 >>> OGT 5/8 >>> Foley 5/8 >>> R rad A-line 5/8 >>> L IJ CVL 5/8 >>  CULTURES: 5/8 Blood >>>None 5/7 Urine >>>neg  Blood 5/15>>> Urine 5/15>>> Sputum 5/15>>>  ANTIBIOTICS: Aztreonam 5/8 >>>5/9 Flagyl 5/8 >>>5/9 Levaquin 5/8 x 1 Cipro 5/8 x 1 ertapenem 5/9>>> Vanc 5/15>>>  Interval events: Much more alert and interactive this AM.  Following commands.  VITAL SIGNS: Temp:  [99.6 F (37.6 C)-101.1 F (38.4 C)] 99.6 F (37.6 C) (05/15 0400) Pulse Rate:  [84-125] 125 (05/15 0418) Resp:  [13-21] 13 (05/15 0730) BP: (133-167)/(63-86) 142/70 mmHg (05/15 0418) SpO2:  [92 %-97 %] 92 % (05/15 0418) Arterial Line BP: (92-180)/(50-90) 152/75 mmHg (05/15 0730) FiO2 (%):  [40 %] 40 % (05/15 0730) Weight:  [99.9 kg (220 lb 3.8 oz)] 99.9 kg (220 lb 3.8 oz) (05/15 0441)  HEMODYNAMICS: CVP:  [14 mmHg] 14 mmHg VENTILATOR SETTINGS: Vent Mode:  [-] PRVC FiO2 (%):  [40 %] 40 % Set Rate:  [14 bmp] 14 bmp Vt Set:  [450 mL] 450 mL PEEP:  [5 cmH20] 5 cmH20 Pressure Support:  [8 cmH20] 8 cmH20 Plateau Pressure:  [16 cmH20-22 cmH20] 18 cmH20  INTAKE / OUTPUT: Intake/Output     05/14 0701 - 05/15 0700 05/15 0701 - 05/16 0700   I.V. (mL/kg) 508.5 (5.1) 40 (0.4)   IV Piggyback 400 50   TPN 1500 140    Total Intake(mL/kg) 2408.5 (24.1) 230 (2.3)   Urine (mL/kg/hr) 5925 (2.5) 130 (0.7)   Drains 10 (0)    Total Output 5935 130   Net -3526.5 +100         PHYSICAL EXAMINATION: General:  Arousable and interactive on vent. HEENT: NCAT, minimal sleral icterus noted, PERRL, ETT, OG tubes in place. PULM: Coarse BS diffusely. CV: RRR, no mgr. AB: Central wound vac in place, no bowel sounds. Ext: Anasarca improving, SCD's in place. Neuro: arouses to voice, does not follow commands, moves all four ext.  LABS:  Recent Labs Lab 11/13/12 0415 11/14/12 0359 11/15/12 0932 11/18/12 0455 11/19/12 0421  PHART 7.365 7.406 7.354 7.516* 7.447  PCO2ART 39.4 37.5 43.9 39.1 47.0*  PO2ART 94.0 73.0* 85.6 86.5 116.0*  HCO3 22.0 23.1 23.8 31.3* 31.7*  TCO2 20.8 22.1 22.6 28.3 28.8  O2SAT 97.3 94.6 96.3 96.9 98.2   Recent Labs Lab 11/17/12 0440 11/18/12 0445 11/19/12 0409  NA 140 142 145  K 3.5 3.3* 2.8*  CL 107 106 105  CO2 28 32 33*  BUN 35* 31* 28*  CREATININE 1.26* 1.02 1.02  GLUCOSE 209* 152* 118*   Recent Labs Lab 11/17/12 0440 11/18/12 0445 11/19/12 0409  HGB 10.2* 10.4* 10.8*  HCT 31.4* 32.4* 33.8*  WBC 7.7 10.7* 22.8*  PLT 113* 100* 121*   Lab  Results  Component Value Date   INR 2.09* 11/18/2012   INR 1.88* 11/16/2012   INR 2.05* 11/14/2012   Recent Labs Lab 11/18/12 1538 11/18/12 2018 11/19/12 0026 11/19/12 0306 11/19/12 0748  GLUCAP 131* 129* 130* 134* 125*   CXR: 5/12: Left > right airspace disease. Prob a mix of ALI and volume   ASSESSMENT / PLAN:  PULMONARY A:  Acute post operative respiratory failure.  At risk for TRALI (multiple transfusions). >CXR w/ L > R LL ATX vs infiltrate. No sig change radiographically. She has sig accessory muscle use w/ forced exhale phase.  P:   - Diurese as ordered. - Added BDs. - Continue PS until after CT then will extubate. - F/u cxr. - If fails extubation will reintubate then trach.  CARDIOVASCULAR A: Sepsis /  septic shock secondary to peritonitis.-->resolved  P:  - Now working on volume removal. - D/c hydrocort (random was 69.9). - Add labetalol PO and push 10 mg IV x1 now.  RENAL A:  AKI, improving P:   - Lasix as ordered for anasarca. - F/u chem. - Close obs of lytes while being diuresed.  GASTROINTESTINAL A:  Alcoholic cirrhosis.  Acute alcoholic hepatitis.  Perforated viscus in jejunem s/p repair 5/8.  P:   - CCS managing. - TF not tolerated, TPN started, will continue until able to use GI tract per surgery. - Protonix for GI Px. - Wound vac in place, will be managed bedside at this point. - See ID section.  HEMATOLOGIC A:  Coagulopathy (presume r/t liver disease). Significant intraoperative bleeding controlled with platelets and plasma.       Post-op anemia: hgb holding s/p transfusions P:  - INR goal < 2, Vit K given IV.  INFECTIOUS  Recent Labs Lab 11/12/12 1757 11/13/12 0430 11/14/12 0400  PROCALCITON 2.51 2.78 1.54   A:  Peritonitis.  PCN / Cephalosporin allergy.  WBC increase overnight. P:   - Cultures and antibiotics as per flowsheet above. - See dashboard. - CT of the abdomen and pelvis with PO contrast today. - Will reculture and add vanc.  ENDOCRINE  A:  Hypoglycemia. P:   - ISS.  NEUROLOGIC A:  Acute encephalopathy. H/o Alcohol abuse. Post op pain. At risk for alcohol withdrawal. P:   - Goal RASS  -1 to -2. - Thiamin / Folate. - D/C Precedex and continue PRN fentanyl.  TODAY'S SUMMARY: Much more awake this AM will wait til after CT is done then extubate, reculture and start vancomycin.  If fails extubation will reintubate then trach.  I have personally obtained a history, examined the patient, evaluated laboratory and imaging results, formulated the assessment and plan and placed orders.  CRITICAL CARE:  The patient is critically ill with multiple organ systems failure and requires high complexity decision making for assessment and support,  frequent evaluation and titration of therapies, application of advanced monitoring technologies and extensive interpretation of multiple databases. Critical Care Time devoted to patient care services described in this note is 35 minutes.   Alyson Reedy, M.D. [facility] Pulmonary/Critical Care Medicine. Pager: [phone]. After hours pager: [phone].

## 2012-11-19 NOTE — Progress Notes (Signed)
Patient ID: [name] [name], female   DOB: 27-Jan-1946, 67 y.o.   MRN: [id number]  5 Days Post-Op  Subjective: On vent but now more awake and follows commands, grimaces a lot to palp of abdomen.  Objective: Vital signs in last 24 hours: Temp:  [99.6 F (37.6 C)-101.1 F (38.4 C)] 99.6 F (37.6 C) (05/15 0400) Pulse Rate:  [84-125] 125 (05/15 0418) Resp:  [13-21] 13 (05/15 0730) BP: (133-167)/(63-86) 142/70 mmHg (05/15 0418) SpO2:  [92 %-100 %] 92 % (05/15 0418) Arterial Line BP: (92-180)/(50-90) 152/75 mmHg (05/15 0730) FiO2 (%):  [40 %] 40 % (05/15 0730) Weight:  [220 lb 3.8 oz (99.9 kg)] 220 lb 3.8 oz (99.9 kg) (05/15 0441) Last BM Date: 11/10/12  Intake/Output from previous day: 05/14 0701 - 05/15 0700 In: 2408.5 [I.V.:508.5; IV Piggyback:400; TPN:1500] Out: 5935 [Urine:5925; Drains:10] Intake/Output this shift: Total I/O In: 230 [I.V.:40; IV Piggyback:50; TPN:140] Out: 130 [Urine:130]  General: NAD but does grimace to palp of abd Resp: rhonchi bil GI: more distended, +bs, tender, vac in place and edges healthy, no erythema   Lab Results:   Recent Labs  11/18/12 0445 11/19/12 0409  WBC 10.7* 22.8*  HGB 10.4* 10.8*  HCT 32.4* 33.8*  PLT 100* 121*   BMET  Recent Labs  11/18/12 0445 11/19/12 0409  NA 142 145  K 3.3* 2.8*  CL 106 105  CO2 32 33*  GLUCOSE 152* 118*  BUN 31* 28*  CREATININE 1.02 1.02  CALCIUM 8.3* 8.1*     Studies/Results: Dg Chest Port 1 View  11/19/2012   *RADIOLOGY REPORT*  Clinical Data: Pulmonary vascular congestion.  Endotracheal tube placement.  PORTABLE CHEST - 1 VIEW  Comparison: 11/18/2012  Findings: Endotracheal tube, central venous catheter, and NG tube all appear in good position.  Pulmonary vascular congestion persists.  Increasing density at the left base is probably an effusion.  Slight atelectasis at the right base is unchanged.  IMPRESSION: Increasing left pleural effusion.  Persistent pulmonary vascular congestion and  slight right base atelectasis.   Original Report Authenticated By: Francene Boyers, M.D.   Dg Chest Port 1 View  11/18/2012   *RADIOLOGY REPORT*  Clinical Data: Endotracheal tube placement  PORTABLE CHEST - 1 VIEW  Comparison: 11/17/2012  Findings: 0453 hours.  Endotracheal tube tip is 3.2 cm above the base of the carina.  The left IJ central venous catheter tip overlies the mid to lower SVC.  Lung volumes are low. The cardiopericardial silhouette is enlarged.  Vascular congestion noted without overt airspace pulmonary edema.  There is slight improvement in basilar lung aeration. The NG tube passes into the stomach although the distal tip position is not included on the film. Telemetry leads overlie the chest.  IMPRESSION: Low volume film with cardiomegaly vascular congestion.  Slight improvement of bibasilar collapse / consolidation.   Original Report Authenticated By: Kennith Center, M.D.    Anti-infectives: Anti-infectives   Start     Dose/Rate Route Frequency Ordered Stop   11/13/12 1500  ertapenem (INVANZ) 1 g in sodium chloride 0.9 % 50 mL IVPB     1 g 100 mL/hr over 30 Minutes Intravenous Every 24 hours 11/12/12 1758     11/13/12 1000  Levofloxacin (LEVAQUIN) IVPB 250 mg  Status:  Discontinued     250 mg 50 mL/hr over 60 Minutes Intravenous Every 24 hours 11/12/12 0953 11/12/12 1259   11/12/12 2200  ciprofloxacin (CIPRO) IVPB 400 mg  Status:  Discontinued     400  mg 200 mL/hr over 60 Minutes Intravenous Every 12 hours 11/12/12 1308 11/12/12 1747   11/12/12 1830  aztreonam (AZACTAM) 1 g in dextrose 5 % 50 mL IVPB  Status:  Discontinued     1 g 100 mL/hr over 30 Minutes Intravenous Every 8 hours 11/12/12 1811 11/13/12 0931   11/12/12 1446  ertapenem (INVANZ) 1 g in sodium chloride 0.9 % 50 mL IVPB  Status:  Discontinued    Comments:  stat   1 g 100 mL/hr over 30 Minutes Intravenous 60 min pre-op 11/12/12 1444 11/12/12 1700   11/12/12 1400  metroNIDAZOLE (FLAGYL) IVPB 500 mg  Status:   Discontinued     500 mg 100 mL/hr over 60 Minutes Intravenous Every 8 hours 11/12/12 1308 11/13/12 0911   11/12/12 1000  levofloxacin (LEVAQUIN) IVPB 500 mg     500 mg 100 mL/hr over 60 Minutes Intravenous  Once 11/12/12 0942 11/12/12 1157      Assessment/Plan: Persistent Fever/now with significant leukocytosis: also has more distention and tenderness then earlier this week, will order CT abd/pelvis with contrast via NGT to evaluate for intraabdominal process causing sepsis. 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  --FEN- continue TNA until bowel function allows for enteral feeding   LOS: 8 days    [name] [name], [name] [name] 11/19/2012

## 2012-11-19 NOTE — Therapy (Signed)
Pt transported to CT Scan on Vent and back.

## 2012-11-19 NOTE — Progress Notes (Signed)
Nutrition Brief Note  Per MD, okay to put protein powder through feeding tube to help meet patients estimated protein needs.  Pt currently receiving Clinimix 5/15 at 60 ml/hr + Lipids MWF to provide 72 g protein/day and an average of 1228 Kcal/day. (provides 103% of caloric needs and 63% of protein needs)  Recommendations: Decrease Clinamix 5/15 rate to 50 ml/hr which will provide 1058 kcal and 60 grams of protein. Provide Beneprotein packet 8 times daily thru feeding tube to provide 200 kcal and 48 grams of protein. TPN @50  ml/hr plus Beneprotein will provide 1258 kcal and 108 grams of protein and will meet 106% of estimated calorie needs and 95% of estimated protein needs.  If TPN rate cannot be decreased recommend providing Beneprotein 4 times daily to provide 100 kcal and 24 grams of protein.TPN @60  ml/hr plus Beneprotein QID will provide a total of 1338 calories and 96 grams of protein which will meet 112% of estimated calorie needs and 84% of estimated protein needs.   Ian Malkin RD, LDN Inpatient Clinical Dietitian Pager: [phone] After Hours Pager: [phone]

## 2012-11-20 ENCOUNTER — Encounter (HOSPITAL_COMMUNITY): Payer: Self-pay | Admitting: Radiology

## 2012-11-20 ENCOUNTER — Inpatient Hospital Stay (HOSPITAL_COMMUNITY): Payer: BC Managed Care – PPO

## 2012-11-20 DIAGNOSIS — K560 Paralytic ileus: Secondary | ICD-10-CM

## 2012-11-20 LAB — BLOOD GAS, ARTERIAL
Acid-Base Excess: 9 mmol/L — ABNORMAL HIGH (ref 0.0–2.0)
Bicarbonate: 33.3 mEq/L — ABNORMAL HIGH (ref 20.0–24.0)
PEEP: 5 cmH2O
Patient temperature: 98.6
TCO2: 29.9 mmol/L (ref 0–100)
TCO2: 30.5 mmol/L (ref 0–100)
pCO2 arterial: 42.9 mmHg (ref 35.0–45.0)
pCO2 arterial: 46.8 mmHg — ABNORMAL HIGH (ref 35.0–45.0)
pH, Arterial: 7.466 — ABNORMAL HIGH (ref 7.350–7.450)
pO2, Arterial: 68.3 mmHg — ABNORMAL LOW (ref 80.0–100.0)
pO2, Arterial: 250 mmHg — ABNORMAL HIGH (ref 80.0–100.0)

## 2012-11-20 LAB — GLUCOSE, CAPILLARY
Glucose-Capillary: 128 mg/dL — ABNORMAL HIGH (ref 70–99)
Glucose-Capillary: 135 mg/dL — ABNORMAL HIGH (ref 70–99)
Glucose-Capillary: 137 mg/dL — ABNORMAL HIGH (ref 70–99)
Glucose-Capillary: 139 mg/dL — ABNORMAL HIGH (ref 70–99)
Glucose-Capillary: 147 mg/dL — ABNORMAL HIGH (ref 70–99)

## 2012-11-20 LAB — CBC
HCT: 31.8 % — ABNORMAL LOW (ref 36.0–46.0)
MCH: 29 pg (ref 26.0–34.0)
MCHC: 30.8 g/dL (ref 30.0–36.0)
MCV: 94.1 fL (ref 78.0–100.0)
RDW: 19.7 % — ABNORMAL HIGH (ref 11.5–15.5)

## 2012-11-20 LAB — BASIC METABOLIC PANEL
BUN: 27 mg/dL — ABNORMAL HIGH (ref 6–23)
CO2: 34 mEq/L — ABNORMAL HIGH (ref 19–32)
Chloride: 104 mEq/L (ref 96–112)
Creatinine, Ser: 0.99 mg/dL (ref 0.50–1.10)
GFR calc Af Amer: 68 mL/min — ABNORMAL LOW (ref >90)

## 2012-11-20 LAB — URINE CULTURE

## 2012-11-20 LAB — PROTIME-INR
INR: 2 — ABNORMAL HIGH (ref 0.00–1.49)
Prothrombin Time: 21.9 seconds — ABNORMAL HIGH (ref 11.6–15.2)

## 2012-11-20 MED ORDER — FENTANYL CITRATE 0.05 MG/ML IJ SOLN
25.0000 ug | INTRAMUSCULAR | Status: DC | PRN
  Administered 2012-11-20 – 2012-12-02 (×6): 50 ug via INTRAVENOUS
  Filled 2012-11-20 (×6): qty 2

## 2012-11-20 MED ORDER — BIOTENE DRY MOUTH MT LIQD: 15.0000 mL | Freq: Four times a day (QID) | OROMUCOSAL | Status: DC

## 2012-11-20 MED ORDER — BIOTENE DRY MOUTH MT LIQD
15.0000 mL | Freq: Four times a day (QID) | OROMUCOSAL | Status: DC
  Administered 2012-11-20 – 2012-11-26 (×24): 15 mL via OROMUCOSAL

## 2012-11-20 MED ORDER — ETOMIDATE 2 MG/ML IV SOLN
INTRAVENOUS | Status: AC
  Filled 2012-11-20: qty 20

## 2012-11-20 MED ORDER — SODIUM CHLORIDE 0.9 % IV SOLN
1.0000 mg/h | INTRAVENOUS | Status: DC
  Filled 2012-11-20: qty 10

## 2012-11-20 MED ORDER — ROCURONIUM BROMIDE 50 MG/5ML IV SOLN
1.0000 mg/kg | Freq: Once | INTRAVENOUS | Status: DC
  Filled 2012-11-20: qty 9.73

## 2012-11-20 MED ORDER — SODIUM CHLORIDE 0.9 % IV SOLN
500.0000 mg | Freq: Three times a day (TID) | INTRAVENOUS | Status: DC
  Administered 2012-11-20 – 2012-11-21 (×4): 500 mg via INTRAVENOUS
  Filled 2012-11-20 (×5): qty 500

## 2012-11-20 MED ORDER — LIDOCAINE HCL (CARDIAC) 20 MG/ML IV SOLN
INTRAVENOUS | Status: AC
  Filled 2012-11-20: qty 5

## 2012-11-20 MED ORDER — DEXMEDETOMIDINE HCL IN NACL 200 MCG/50ML IV SOLN
0.4000 ug/kg/h | INTRAVENOUS | Status: DC
  Administered 2012-11-20: 0.4 ug/kg/h via INTRAVENOUS
  Filled 2012-11-20: qty 50

## 2012-11-20 MED ORDER — FAT EMULSION 20 % IV EMUL
250.0000 mL | INTRAVENOUS | Status: AC
  Administered 2012-11-20: 250 mL via INTRAVENOUS
  Filled 2012-11-20: qty 250

## 2012-11-20 MED ORDER — POTASSIUM CHLORIDE 10 MEQ/50ML IV SOLN
10.0000 meq | INTRAVENOUS | Status: AC
  Administered 2012-11-20 (×2): 10 meq via INTRAVENOUS
  Filled 2012-11-20: qty 100

## 2012-11-20 MED ORDER — CHLORHEXIDINE GLUCONATE 0.12 % MT SOLN
15.0000 mL | Freq: Two times a day (BID) | OROMUCOSAL | Status: DC
  Administered 2012-11-20 – 2012-11-25 (×11): 15 mL via OROMUCOSAL
  Filled 2012-11-20 (×11): qty 15

## 2012-11-20 MED ORDER — FENTANYL CITRATE 0.05 MG/ML IJ SOLN
INTRAMUSCULAR | Status: AC
  Administered 2012-11-20: 100 ug
  Filled 2012-11-20: qty 2

## 2012-11-20 MED ORDER — SUCCINYLCHOLINE CHLORIDE 20 MG/ML IJ SOLN
INTRAMUSCULAR | Status: AC
  Filled 2012-11-20: qty 1

## 2012-11-20 MED ORDER — ALBUTEROL SULFATE HFA 108 (90 BASE) MCG/ACT IN AERS
6.0000 | INHALATION_SPRAY | Freq: Four times a day (QID) | RESPIRATORY_TRACT | Status: DC
  Administered 2012-11-20 – 2012-11-22 (×8): 6 via RESPIRATORY_TRACT
  Filled 2012-11-20: qty 6.7

## 2012-11-20 MED ORDER — ROCURONIUM BROMIDE 50 MG/5ML IV SOLN: 50.0000 mg | Freq: Once | INTRAVENOUS | Status: DC

## 2012-11-20 MED ORDER — MIDAZOLAM HCL 2 MG/2ML IJ SOLN
1.0000 mg | Freq: Once | INTRAMUSCULAR | Status: AC
  Administered 2012-11-20: 1 mg via INTRAVENOUS

## 2012-11-20 MED ORDER — NOREPINEPHRINE BITARTRATE 1 MG/ML IJ SOLN
2.0000 ug/min | INTRAVENOUS | Status: DC
  Administered 2012-11-20: 2 ug/min via INTRAVENOUS
  Filled 2012-11-20: qty 4

## 2012-11-20 MED ORDER — M.V.I. ADULT IV INJ
INTRAVENOUS | Status: AC
  Administered 2012-11-20: 17:00:00 via INTRAVENOUS
  Filled 2012-11-20: qty 2000

## 2012-11-20 MED ORDER — ETOMIDATE 2 MG/ML IV SOLN
20.0000 mg | Freq: Once | INTRAVENOUS | Status: AC
  Administered 2012-11-20: 20 mg via INTRAVENOUS
  Filled 2012-11-20: qty 10

## 2012-11-20 MED ORDER — CHLORHEXIDINE GLUCONATE 0.12 % MT SOLN: 15.0000 mL | Freq: Two times a day (BID) | OROMUCOSAL | Status: DC

## 2012-11-20 MED ORDER — FENTANYL CITRATE 0.05 MG/ML IJ SOLN: 50.0000 ug | Freq: Once | INTRAMUSCULAR | Status: DC

## 2012-11-20 MED ORDER — FENTANYL CITRATE 0.05 MG/ML IJ SOLN
100.0000 ug | Freq: Once | INTRAMUSCULAR | Status: AC
  Administered 2012-11-20: 100 ug via INTRAVENOUS

## 2012-11-20 MED ORDER — ROCURONIUM BROMIDE 50 MG/5ML IV SOLN
INTRAVENOUS | Status: AC
  Administered 2012-11-20: 50 mg
  Filled 2012-11-20: qty 2

## 2012-11-20 MED ORDER — MIDAZOLAM HCL 2 MG/2ML IJ SOLN
INTRAMUSCULAR | Status: AC
  Administered 2012-11-20: 11:00:00
  Filled 2012-11-20: qty 4

## 2012-11-20 NOTE — Progress Notes (Signed)
PARENTERAL NUTRITION CONSULT NOTE   Pharmacy Consult for TPN Indication: Intolerance to enteral feeding, s/p repair of bowel perforation  Allergies  Allergen Reactions  . Cephalexin Rash  . Penicillins Rash    Patient Measurements: Height: 5\' 5"  (165.1 cm) Weight: 214 lb 8.1 oz (97.3 kg) IBW/kg (Calculated) : 57  Vital Signs: Temp: 98.9 F (37.2 C) (05/16 0400) Temp src: Oral (05/16 0400) BP: 112/54 mmHg (05/16 0700) Pulse Rate: 88 (05/16 0700) Intake/Output from previous day: 05/15 0701 - 05/16 0700 In: 3928.2 [P.O.:650; I.V.:500; NG/GT:450; IV Piggyback:850; TPN:1478.2] Out: 4911 [Urine:4890; Emesis/NG output:20; Stool:1]  Labs:  Recent Labs  11/18/12 0445 11/19/12 0409 11/20/12 0415  WBC 10.7* 22.8* 22.0*  HGB 10.4* 10.8* 9.8*  HCT 32.4* 33.8* 31.8*  PLT 100* 121* 100*  INR 2.09*  --   --      Recent Labs  11/18/12 0445 11/19/12 0409 11/19/12 2000 11/20/12 0415  NA 142 145 142 143  K 3.3* 2.8* 3.7 3.4*  CL 106 105 104 104  CO2 32 33* 33* 34*  GLUCOSE 152* 118* 143* 150*  BUN 31* 28* 26* 27*  CREATININE 1.02 1.02 0.96 0.99  CALCIUM 8.3* 8.1* 8.3* 8.4  MG 1.9 2.0  --  1.9  PHOS 3.1 4.2  --  4.0  PROT  --  6.3  --   --   ALBUMIN  --  1.6*  --   --   AST  --  132*  --   --   ALT  --  53*  --   --   ALKPHOS  --  135*  --   --   BILITOT  --  3.4*  --   --    Estimated Creatinine Clearance: 66.2 ml/min (by C-G formula based on Cr of 0.99).    Recent Labs  11/19/12 2016 11/20/12 0012 11/20/12 0327  GLUCAP 135* 139* 129*    Nutritional Goals:  RD Recommendation 5/15: Decrease Clinimix 5/15 to 50 ml/hr which will provide an average of 1058 kcal and 60 grams of protein. Provide Beneprotein 2 packet 4 times daily thru feeding tube to provide 200 kcal and 48 grams of protein. TPN @ 50 ml/hr plus Beneprotein will provide 1258 kcal and 108 grams of protein and will meet 106% of estimated calorie needs and 95% of estimated protein needs.  Current  nutrition:   Diet: NPO  IVF: NS at [facility]  Beneprotein per feeding tube   CBGs & Insulin requirements past 24 hours:   CBGs:  129-154  Resistant SSI: 19 units/ 24hrs  Insulin 20 units/Bag  Assessment:   67 yo F admit 5/7 with abdominal pain and on 5/8 went to OR for exp laparatomy with repair of perforated small bowel and abdominal wall debridement.  Due to diffuse peritonitis, wound closure was delayed; pt was returned to OR on 5/10 for peritoneal washout, debridement, abdominal wall closure.  Enteral feeding goal is Vital AF 1.2 cal at 30 ml/hr.  Tube feeds were started 5/10, but d/c on 5/12 d/t high residuals ( ).  Per notes, planning to resume TF once bowel function returns.  Labs:   Electrolytes: potassium low 3.4, replaced by MD, other electrolytes WNL  Renal function: Scr has improved to baseline, CrCl 66 ml/min  Hepatic function: AST/ALT, alk phos are elevated and trending up. Hx of alcoholic cirrhosis noted, will monitor trend.   Pre-Albumin: 6.9 (5/13)  TG/Cholesterol: wnl (5/13)  Glucose:  Within goal range (< 150 mg/dL)  Plan:  Continue Clinimix E 5/15 at 50 ml/hr Increase Insulin in TNA to 30 units/Bag (At current rate will provide 18 units insulin over 24 hours) Fat emulsion at 10 ml/hr (MWF only due to ongoing shortage). TNA to contain standard multivitamins and trace elements (MWF only due to ongoing shortage). Continue IVF at 10-20 ml/hr. Continue resistant SSI q4h. TNA lab panels on Mondays & Thursdays.  Clydene Fake PharmD Pager #: [phone] 8:08 AM 11/20/2012

## 2012-11-20 NOTE — Progress Notes (Signed)
SLP Cancellation Note  Patient Details Name: [name] [name] MRN: [id number] DOB: 1946-06-14   Cancelled treatment:        Order received for swallow evaluation, however, pt. Has been re-intubated.  Please re-order when appropriate.   Maryjo Rochester T 11/20/2012, 12:51 PM

## 2012-11-20 NOTE — Procedures (Signed)
Arterial Catheter Insertion Procedure Note [name] [name] [id number] 1946-06-10  Procedure: Insertion of Arterial Catheter  Indications: Blood pressure monitoring  Procedure Details Consent: Unable to obtain consent because of altered level of consciousness. Time Out: Verified patient identification, verified procedure, site/side was marked, verified correct patient position, special equipment/implants available, medications/allergies/relevent history reviewed, required imaging and test results available.  Performed  Maximum sterile technique was used including antiseptics, cap, gloves, gown, hand hygiene, mask and sheet. Skin prep: Chlorhexidine; local anesthetic administered 20 gauge catheter was inserted into right radial artery using the Seldinger technique.  Evaluation Blood flow good; BP tracing good. Complications: No apparent complications.   [name] [name] 11/20/2012

## 2012-11-20 NOTE — Procedures (Signed)
Bronchoscopy Procedure Note PRISCILLIA FOUCH [id number] 1945/07/25  Procedure: Bronchoscopy Indications: Obtain specimens for culture and/or other diagnostic studies and Remove secretions  Procedure Details Consent: Risks of procedure as well as the alternatives and risks of each were explained to the (patient/caregiver).  Consent for procedure obtained. Time Out: Verified patient identification, verified procedure, site/side was marked, verified correct patient position, special equipment/implants available, medications/allergies/relevent history reviewed, required imaging and test results available.  Performed  In preparation for procedure, patient was given 100% FiO2 and bronchoscope lubricated. Sedation: Benzodiazepines, Muscle relaxants and Etomidate  Airway entered and the following bronchi were examined: RUL, RML, RLL, LUL, LLL and Bronchi.   Procedures performed: Brushings performed Bronchoscope removed.    Evaluation Hemodynamic Status: BP stable throughout; O2 sats: stable throughout Patient's Current Condition: stable Specimens:  Sent purulent fluid Complications: No apparent complications Patient did tolerate procedure well.   [name] 11/20/2012

## 2012-11-20 NOTE — Progress Notes (Signed)
Patient interviewed and examined, agree with PA note above. Patient has now had to be reintubated due to progressive atelectasis. Her abdomen remained soft and without apparent tenderness. The VAC dressing is in place and clean. Tracheostomy is planned. We'll likely to begin some slow enteral feedings soon Mariella Saa MD, FACS  11/20/2012 3:53 PM

## 2012-11-20 NOTE — Procedures (Signed)
Intubation Procedure Note CHERELLE MIDKIFF [id number] Mar 22, 1946  Procedure: Intubation Indications: Airway protection and maintenance  Procedure Details Consent: Risks of procedure as well as the alternatives and risks of each were explained to the (patient/caregiver).  Consent for procedure obtained. Time Out: Verified patient identification, verified procedure, site/side was marked, verified correct patient position, special equipment/implants available, medications/allergies/relevent history reviewed, required imaging and test results available.  Performed  Maximum sterile technique was used including cap, gloves, gown, hand hygiene, mask and sheet.  MAC and 3  Evaluation Hemodynamic Status: BP stable throughout; O2 sats: stable throughout Patient's Current Condition: stable Complications: No apparent complications Patient did tolerate procedure well. Chest X-ray ordered to verify placement.  CXR: pending.  Rosebrock, Richard A 11/20/2012  Patient seen and examined, agree with above note.  I dictated the care and orders written for this patient under my direction.  Alyson Reedy, MD [phone]

## 2012-11-20 NOTE — Clinical Social Work Psychosocial (Signed)
Clinical Social Work Department BRIEF PSYCHOSOCIAL ASSESSMENT 11/20/2012  Patient:  [name] [name], [name] [name]     Account Number:  [id number]     Admit date:  11/11/2012  Clinical Social Worker:  Jodelle Red  Date/Time:  11/20/2012 10:00 AM  Referred by:  CSW  Date Referred:  11/20/2012 Referred for  Other - See comment   Other Referral:   ADJUSTMENT TO ILLNESS   Interview type:  Family Other interview type:   HUSBAND, CHART REVIEW    PSYCHOSOCIAL DATA Living Status:  HUSBAND Admitted from facility:   Level of care:   Primary support name:  [name] [name] Primary support relationship to patient:  SPOUSE Degree of support available:   GOOD FROM SPOUSE AND TWO CHILDREN    CURRENT CONCERNS Current Concerns  Adjustment to Illness   Other Concerns:    SOCIAL WORK ASSESSMENT / PLAN CSW checked in on Pt's husand to offer support and assess needs. CSW explained role of ICU CSW and provided supportive listening. Husband appeared very stressed and scattered due to wife's current medical concerns. Husband shared Pt has had multiple medical issues since last July and is "not sure she is going to get better." CSW discussed resources and will follow support.   Assessment/plan status:  Psychosocial Support/Ongoing Assessment of Needs Other assessment/ plan:   Information/referral to community resources:    PATIENT'S/FAMILY'S RESPONSE TO PLAN OF CARE: Husband very stressed, CSW will follow for support. Husband agrees. CSW will follow.     Doreen Salvage, LCSW ICU/Stepdown Clinical Social Worker [facility] Cell [phone] Hours 8am-1200pm M-F

## 2012-11-20 NOTE — Progress Notes (Signed)
[facility] ADULT ICU REPLACEMENT PROTOCOL FOR AM LAB REPLACEMENT ONLY  The patient does apply for the [facility] Adult ICU Electrolyte Replacment Protocol based on the criteria listed below:   1. Is GFR >/= 40 ml/min? yes  Patient's GFR today is 59 2. Is urine output >/= 0.5 ml/kg/hr for the last 6 hours? yes Patient's UOP is 2.18 ml/kg/hr 3. Is BUN < 60 mg/dL? yes  Patient's BUN today is 27 4. Abnormal electrolyte(s): k+ 3.4 5. Ordered repletion with: per protocol 6. If a panic level lab has been reported, has the CCM MD in charge been notified? yes.   Physician:  Dr. Higinio Plan, Riggs Dineen A 11/20/2012 6:39 AM

## 2012-11-20 NOTE — Progress Notes (Signed)
Patient ID: [name] [name], female   DOB: 1946-02-01, 67 y.o.   MRN: [id number]  6 Days Post-Op  Subjective: Extubated and surprising alert and awake, answering questions well, denies significant complaints, had BM this am, denies n/v, excited about drinking,  Objective: Vital signs in last 24 hours: Temp:  [98.9 F (37.2 C)-99.8 F (37.7 C)] 98.9 F (37.2 C) (05/16 0400) Pulse Rate:  [84-96] 88 (05/16 0700) Resp:  [12-23] 22 (05/16 0700) BP: (84-157)/(40-116) 112/54 mmHg (05/16 0700) SpO2:  [91 %-98 %] 98 % (05/16 0700) Arterial Line BP: (151-154)/(77-79) 151/77 mmHg (05/15 1100) FiO2 (%):  [40 %] 40 % (05/15 1419) Weight:  [214 lb 8.1 oz (97.3 kg)] 214 lb 8.1 oz (97.3 kg) (05/16 0130) Last BM Date: 11/10/12 (per chart)  Intake/Output from previous day: 05/15 0701 - 05/16 0700 In: 3928.2 [P.O.:650; I.V.:500; NG/GT:450; IV Piggyback:850; TPN:1478.2] Out: 4911 [Urine:4890; Emesis/NG output:20; Stool:1] Intake/Output this shift:    General: NAD, awake Resp: rhonchi bil, coarse bilateral GI: softer today, +BS, mildly tender, vac in place  Lab Results:   Recent Labs  11/19/12 0409 11/20/12 0415  WBC 22.8* 22.0*  HGB 10.8* 9.8*  HCT 33.8* 31.8*  PLT 121* 100*   BMET  Recent Labs  11/19/12 2000 11/20/12 0415  NA 142 143  K 3.7 3.4*  CL 104 104  CO2 33* 34*  GLUCOSE 143* 150*  BUN 26* 27*  CREATININE 0.96 0.99  CALCIUM 8.3* 8.4     Studies/Results: Ct Abdomen Pelvis W Contrast  11/19/2012   *RADIOLOGY REPORT*  Clinical Data: Abdominal pain, leukocytosis, recent surgery for perforated small bowel  CT ABDOMEN AND PELVIS WITH CONTRAST  Technique:  Multidetector CT imaging of the abdomen and pelvis was performed following the standard protocol during bolus administration of intravenous contrast.  Contrast: OMNIPAQUE IOHEXOL 300 MG/ML  SOLN  Comparison: 09/18/2012  Findings: Motion degraded images.  Mild patchy opacity in the right lower lobe (series 5/image  7), suspicious for pneumonia.  Additional patchy opacity in the left lower lobe (series 5/image 8), suspicious for pneumonia, less likely compressive atelectasis.  Right basilar opacity, likely atelectasis.  Small left and trace right pleural effusions.  Cirrhotic configuration of the liver.  1.9 x 1.5 cm hypoenhancing lesion in the lateral segment left hepatic lobe, previously favored to reflect a benign hemangioma although indeterminate, unchanged from 2013.  Spleen, pancreas, and adrenal glands are within normal limits.  Small layering gallstones (series 2/image 38).  No associated inflammatory changes by CT.  Kidneys are unremarkable.  No renal calculi or hydronephrosis.  No evidence of bowel obstruction.  No contrast extravasation to suggest leak/perforation following surgery.  Colonic diverticulosis, without associate inflammatory changes.  No free air.  Atherosclerotic calcifications of the abdominal aorta and branch vessels. Shunt/collateral vessel in the left mid abdomen (series 2/image 60), between the portosplenic confluence and the IVC. Portal vein is patent.  Moderate abdominopelvic ascites.  Uterus is mildly heterogeneous, possibly reflecting uterine fibroids.  No adnexal masses.  Bladder decompressed by indwelling Foley catheter.  Postsurgical changes in the midline anterior abdominal wall.  Body wall edema.  Degenerative changes of the visualized thoracolumbar spine.  IMPRESSION: No oral contrast extravasation to suggest leak/perforation.  No free air.  Cirrhosis.  Venous collaterals between the portable at confluence in the IVC.  Portal vein remains patent.  1.9 x 1.5 cm hypoenhancing lesion in the lateral segment left hepatic lobe, indeterminate, unchanged since 2013.  Correlate with serum AFP and consider follow-up  MRI abdomen with/without contrast for further characterization as an outpatient.  Moderate abdominopelvic ascites.  Body wall edema.  Suspected right middle lobe and possible left lower  lobe pneumonia. Small left and trace right pleural effusions.  Additional ancillary findings as above.   Original Report Authenticated By: Charline Bills, M.D.   Dg Chest Port 1 View  11/19/2012   *RADIOLOGY REPORT*  Clinical Data: Pulmonary vascular congestion.  Endotracheal tube placement.  PORTABLE CHEST - 1 VIEW  Comparison: 11/18/2012  Findings: Endotracheal tube, central venous catheter, and NG tube all appear in good position.  Pulmonary vascular congestion persists.  Increasing density at the left base is probably an effusion.  Slight atelectasis at the right base is unchanged.  IMPRESSION: Increasing left pleural effusion.  Persistent pulmonary vascular congestion and slight right base atelectasis.   Original Report Authenticated By: Francene Boyers, M.D.    Anti-infectives: Anti-infectives   Start     Dose/Rate Route Frequency Ordered Stop   11/19/12 1000  vancomycin (VANCOCIN) 1,250 mg in sodium chloride 0.9 % 250 mL IVPB     1,250 mg 166.7 mL/hr over 90 Minutes Intravenous Every 12 hours 11/19/12 0920     11/13/12 1500  ertapenem (INVANZ) 1 g in sodium chloride 0.9 % 50 mL IVPB     1 g 100 mL/hr over 30 Minutes Intravenous Every 24 hours 11/12/12 1758     11/13/12 1000  Levofloxacin (LEVAQUIN) IVPB 250 mg  Status:  Discontinued     250 mg 50 mL/hr over 60 Minutes Intravenous Every 24 hours 11/12/12 0953 11/12/12 1259   11/12/12 2200  ciprofloxacin (CIPRO) IVPB 400 mg  Status:  Discontinued     400 mg 200 mL/hr over 60 Minutes Intravenous Every 12 hours 11/12/12 1308 11/12/12 1747   11/12/12 1830  aztreonam (AZACTAM) 1 g in dextrose 5 % 50 mL IVPB  Status:  Discontinued     1 g 100 mL/hr over 30 Minutes Intravenous Every 8 hours 11/12/12 1811 11/13/12 0931   11/12/12 1446  ertapenem (INVANZ) 1 g in sodium chloride 0.9 % 50 mL IVPB  Status:  Discontinued    Comments:  stat   1 g 100 mL/hr over 30 Minutes Intravenous 60 min pre-op 11/12/12 1444 11/12/12 1700   11/12/12 1400   metroNIDAZOLE (FLAGYL) IVPB 500 mg  Status:  Discontinued     500 mg 100 mL/hr over 60 Minutes Intravenous Every 8 hours 11/12/12 1308 11/13/12 0911   11/12/12 1000  levofloxacin (LEVAQUIN) IVPB 500 mg     500 mg 100 mL/hr over 60 Minutes Intravenous  Once 11/12/12 1610 11/12/12 1157      Assessment/Plan: POD#8-Exp lap, debride of abd wall tissue 2 cm x 8 cm, extensive LOA, repair SB perforation, enterorrhaphy x1  POD#6-Closure of abd wound/washout/debride of fascia: wound vac change today, wound healthy, change MWF  Leukocytosis: doesn't seem to be coming from abd Acute resp failure: extubated, seems to being well at this times, infiltrates seen on CT, leukocytosis may be coming from this, CCm managing, appreciate assistance  AKI: improving, stable for now, UOP good, leave foley for strict I and O FEN: TNA via picc for now, will get swallow study today, can likely start some POs if passes  VTE: scds only, coagulopathy (presume r/t liver disease), recheck INR today  Dispo: likely will need SNF/rehab    LOS: 9 days    Shevon Sian, Haylee 11/20/2012

## 2012-11-20 NOTE — Progress Notes (Signed)
PULMONARY  / CRITICAL CARE MEDICINE  Name: [name] [name] MRN: [id number] DOB: August 12, 1945    ADMISSION DATE:  11/11/2012 CONSULTATION DATE:  11/12/2012  REFERRING MD :  CCS PRIMARY SERVICE:  PCCM  CHIEF COMPLAINT:  Post op respiratory failure  BRIEF PATIENT DESCRIPTION: 67 yo with alcoholic liver disease was admitted on 5/7 with abdominal pain and was found on 5/8 to have a small bowel perforation.  She was taken to the OR and returned to the ICU with an open wound and mechanically ventilated.   SIGNIFICANT EVENTS / STUDIES:  5/7  Admitted with abdominal pain, nausea, vomiting 5/7  OR >>> Perforated viscus, peritonitis, lysis of adhesions, small bowel repair 5/16 Reintubated r/t complete left sided collapse, increased WBC  LINES / TUBES: OETT 5/8 >>>5/15>>>Reintubated 5/16>>> OGT 5/8 >>> Foley 5/8 >>> R rad A-line 5/8 >>>5/12 L IJ CVL 5/8 >>  CULTURES: 5/8 Blood >>>None 5/7 Urine >>>neg  Blood 5/15>>>NGTD Urine 5/15>>> Sputum 5/15>>> few WBC, PMN and mononuclear Sputum via bronch 5/16>>>   ANTIBIOTICS: Aztreonam 5/8 >>>5/9 Flagyl 5/8 >>>5/9 Levaquin 5/8 x 1 Cipro 5/8 x 1 ertapenem 5/9>>>5/16 Vanc 5/15>>> Primaxin 5/16>>>  Interval events: Was extubated early this morning, alert and interactive this AM. Radiographic and WBC suggests worsening pulmonary process.  VITAL SIGNS: Temp:  [98.9 F (37.2 C)-99.7 F (37.6 C)] 98.9 F (37.2 C) (05/16 0800) Pulse Rate:  [84-96] 88 (05/16 0700) Resp:  [12-23] 22 (05/16 0700) BP: (84-157)/(40-116) 112/54 mmHg (05/16 0700) SpO2:  [91 %-98 %] 97 % (05/16 0747) Arterial Line BP: (151)/(77) 151/77 mmHg (05/15 1100) FiO2 (%):  [40 %-100 %] 100 % (05/16 1023) Weight:  [97.3 kg (214 lb 8.1 oz)] 97.3 kg (214 lb 8.1 oz) (05/16 0130)  HEMODYNAMICS: CVP:  [1 mmHg-10 mmHg] 10 mmHg VENTILATOR SETTINGS: Vent Mode:  [-] PRVC FiO2 (%):  [40 %-100 %] 100 % Set Rate:  [14 bmp-18 bmp] 18 bmp Vt Set:  [450 mL-500 mL] 450 mL PEEP:  [5  cmH20] 5 cmH20 Pressure Support:  [8 cmH20] 8 cmH20 Plateau Pressure:  [23 cmH20] 23 cmH20  INTAKE / OUTPUT: Intake/Output     05/15 0701 - 05/16 0700 05/16 0701 - 05/17 0700   P.O. 650    I.V. (mL/kg) 500 (5.1)    NG/GT 450    IV Piggyback 850    TPN 1478.2    Total Intake(mL/kg) 3928.2 (40.4)    Urine (mL/kg/hr) 4890 (2.1)    Emesis/NG output 20 (0)    Drains     Stool 1 (0)    Total Output 4911     Net -982.8           PHYSICAL EXAMINATION: General:  Arousable and interactive although solmulent HEENT: NCAT, minimal sleral icterus noted, PERRL, NG tube in place. PULM: Coarse BS diffusely. Diminished on the left. No wheezes CV: RRR, no mgr. AB: Central wound vac in place, no bowel sounds. ABD tenderness Ext: Anasarca improving, SCD's in place. Neuro: Arouses to voice, does not follow commands, moves all four ext.  LABS:  Recent Labs Lab 11/14/12 0359 11/15/12 0932 11/18/12 0455 11/19/12 0421 11/20/12 0505  PHART 7.406 7.354 7.516* 7.447 7.497*  PCO2ART 37.5 43.9 39.1 47.0* 42.9  PO2ART 73.0* 85.6 86.5 116.0* 68.3*  HCO3 23.1 23.8 31.3* 31.7* 32.9*  TCO2 22.1 22.6 28.3 28.8 29.9  O2SAT 94.6 96.3 96.9 98.2 93.9    Recent Labs Lab 11/19/12 0409 11/19/12 2000 11/20/12 0415  NA 145 142 143  K  2.8* 3.7 3.4*  CL 105 104 104  CO2 33* 33* 34*  BUN 28* 26* 27*  CREATININE 1.02 0.96 0.99  GLUCOSE 118* 143* 150*    Recent Labs Lab 11/18/12 0445 11/19/12 0409 11/20/12 0415  HGB 10.4* 10.8* 9.8*  HCT 32.4* 33.8* 31.8*  WBC 10.7* 22.8* 22.0*  PLT 100* 121* 100*   Lab Results  Component Value Date   INR 2.00* 11/20/2012   INR 2.09* 11/18/2012   INR 1.88* 11/16/2012    Recent Labs Lab 11/19/12 1613 11/19/12 2016 11/20/12 0012 11/20/12 0327 11/20/12 0818  GLUCAP 132* 135* 139* 129* 135*   CXR: 5/16 Left > Complete opacification of the left side.  ASSESSMENT / PLAN:  PULMONARY A:  Acute post operative respiratory failure.  Risk for TRALI  (multiple transfusions).  - S/p extubation, PCXR shows left sided collapse.  Increasing O2 requirements, poor cough mechanics. P:   - Will reintubate now - Full vent support as ordered - Bedside bronchoscopy. - BD scheduled and PRN. - Vent bundle ordered - Wean as tolereated - F/u cxr now and in AM - Will consider trach given recent failed extubation.  CARDIOVASCULAR A: Sepsis / septic shock secondary to peritonitis.-->resolved  P:  - Still diuresing. - Stress dose steroids d/c.  RENAL A:  AKI - resolved P:   - follow I&O  - Lasix as ordered for anasarca. - F/u chem daily - Electrolyte replacement ordered  GASTROINTESTINAL A:  Alcoholic cirrhosis.  Acute alcoholic hepatitis.  Perforated viscus in jejunem s/p repair 5/8.  CT of abdomen and pelvis 5/15>>>no free air or leak. Cirrhosis, stable hepatic lobe mass (from 2013 imaging), moderate ascites present.   P:   - CCS managing. - TF not tolerated, TPN started, will continue until able to use GI tract per surgery. - Protonix for GI Px. - Wound vac in place per surgery - See ID section.  HEMATOLOGIC A:  Coagulopathy - presume r/t liver disease. Significant intraoperative bleeding controlled with platelets and plasma. Vitamin K given on 5/14       Post-op anemia: hgb holding s/p transfusions P:  - monitor CBC, coags daily  INR goal < 2, repeat Vit K?.  INFECTIOUS  Recent Labs Lab 11/14/12 0400  PROCALCITON 1.54   A:  Peritonitis.  PCN / Cephalosporin allergy.  WBC remains elevated P:   - Cultures and antibiotics as per flowsheet above. - Switched Ertapenem to Primaxin - See dashboard for culture data - no organism to date. - Follow temp and WBC curve.  ENDOCRINE  A:  Hypoglycemia. P:   - SSI prn  NEUROLOGIC A:  Acute encephalopathy. H/o Alcohol abuse. Post op pain. At risk for alcohol withdrawal. P:   - Goal RASS  -1 to -2. - Thiamin / Folate. - PRN fentanyl. - Precedex for sedation  TODAY'S SUMMARY:  Patient was extubated early this morning, but required increased amounts of O2. Chest xray showed left sided collapse and WBC remains elevated. ABX will be adjusted. Patient will be intubated, bronched, and will plan for trach in near future.  I have personally obtained a history, examined the patient, evaluated laboratory and imaging results, formulated the assessment and plan and placed orders.  CRITICAL CARE:  The patient is critically ill with multiple organ systems failure and requires high complexity decision making for assessment and support, frequent evaluation and titration of therapies, application of advanced monitoring technologies and extensive interpretation of multiple databases. Critical Care Time devoted to patient care services  described in this note is 35 minutes.   Alyson Reedy, M.D. [facility] Pulmonary/Critical Care Medicine. Pager: [phone]. After hours pager: [phone].

## 2012-11-21 ENCOUNTER — Inpatient Hospital Stay (HOSPITAL_COMMUNITY): Payer: BC Managed Care – PPO

## 2012-11-21 LAB — BASIC METABOLIC PANEL
CO2: 32 mEq/L (ref 19–32)
Chloride: 105 mEq/L (ref 96–112)
GFR calc Af Amer: 78 mL/min — ABNORMAL LOW (ref >90)
Potassium: 3.2 mEq/L — ABNORMAL LOW (ref 3.5–5.1)

## 2012-11-21 LAB — CULTURE, RESPIRATORY W GRAM STAIN

## 2012-11-21 LAB — GLUCOSE, CAPILLARY
Glucose-Capillary: 126 mg/dL — ABNORMAL HIGH (ref 70–99)
Glucose-Capillary: 137 mg/dL — ABNORMAL HIGH (ref 70–99)

## 2012-11-21 LAB — VANCOMYCIN, TROUGH: Vancomycin Tr: 24.6 ug/mL — ABNORMAL HIGH (ref 10.0–20.0)

## 2012-11-21 LAB — PROTIME-INR
INR: 1.82 — ABNORMAL HIGH (ref 0.00–1.49)
Prothrombin Time: 20.4 seconds — ABNORMAL HIGH (ref 11.6–15.2)

## 2012-11-21 LAB — MAGNESIUM: Magnesium: 2 mg/dL (ref 1.5–2.5)

## 2012-11-21 MED ORDER — FUROSEMIDE 10 MG/ML IJ SOLN
20.0000 mg | Freq: Two times a day (BID) | INTRAMUSCULAR | Status: DC
  Administered 2012-11-21: 20 mg via INTRAVENOUS
  Filled 2012-11-21: qty 2

## 2012-11-21 MED ORDER — FUROSEMIDE 10 MG/ML IJ SOLN
INTRAMUSCULAR | Status: AC
  Filled 2012-11-21: qty 4

## 2012-11-21 MED ORDER — POTASSIUM CHLORIDE 10 MEQ/50ML IV SOLN
10.0000 meq | INTRAVENOUS | Status: AC
  Administered 2012-11-21 (×4): 10 meq via INTRAVENOUS
  Filled 2012-11-21: qty 200

## 2012-11-21 MED ORDER — FUROSEMIDE 10 MG/ML IJ SOLN
INTRAMUSCULAR | Status: AC
  Administered 2012-11-21: 20 mg via INTRAVENOUS
  Filled 2012-11-21: qty 4

## 2012-11-21 MED ORDER — JEVITY 1.2 CAL PO LIQD
1000.0000 mL | ORAL | Status: DC
  Administered 2012-11-21: 1000 mL

## 2012-11-21 MED ORDER — VANCOMYCIN HCL IN DEXTROSE 1-5 GM/200ML-% IV SOLN
1000.0000 mg | Freq: Two times a day (BID) | INTRAVENOUS | Status: DC
  Administered 2012-11-21 – 2012-11-23 (×4): 1000 mg via INTRAVENOUS
  Filled 2012-11-21 (×6): qty 200

## 2012-11-21 MED ORDER — INSULIN REGULAR HUMAN 100 UNIT/ML IJ SOLN
INTRAVENOUS | Status: AC
  Administered 2012-11-21: 17:00:00 via INTRAVENOUS
  Filled 2012-11-21: qty 2000

## 2012-11-21 MED ORDER — SODIUM CHLORIDE 0.9 % IV SOLN
500.0000 mg | Freq: Four times a day (QID) | INTRAVENOUS | Status: DC
  Administered 2012-11-21 – 2012-11-25 (×15): 500 mg via INTRAVENOUS
  Filled 2012-11-21 (×18): qty 500

## 2012-11-21 NOTE — Progress Notes (Signed)
PARENTERAL NUTRITION CONSULT NOTE   Pharmacy Consult for TPN Indication: Intolerance to enteral feeding, s/p repair of bowel perforation  Allergies  Allergen Reactions  . Cephalexin Rash  . Penicillins Rash    Patient Measurements: Height: 5\' 5"  (165.1 cm) Weight: 214 lb 8.1 oz (97.3 kg) IBW/kg (Calculated) : 57  Vital Signs: Temp: 99.6 F (37.6 C) (05/17 0400) Temp src: Oral (05/17 0400) BP: 96/55 mmHg (05/17 0401) Pulse Rate: 76 (05/17 0600) Intake/Output from previous day: 05/16 0701 - 05/17 0700 In: 3353.3 [I.V.:663.3; NG/GT:270; IV Piggyback:900; TPN:1520] Out: 1085 [Urine:885; Emesis/NG output:200]  Labs:  Recent Labs  11/19/12 0409 11/20/12 0415 11/20/12 0837 11/21/12 0440  WBC 22.8* 22.0*  --   --   HGB 10.8* 9.8*  --   --   HCT 33.8* 31.8*  --   --   PLT 121* 100*  --   --   INR  --   --  2.00* 1.82*     Recent Labs  11/19/12 0409 11/19/12 2000 11/20/12 0415 11/21/12 0440  NA 145 142 143 140  K 2.8* 3.7 3.4* 3.2*  CL 105 104 104 105  CO2 33* 33* 34* 32  GLUCOSE 118* 143* 150* 144*  BUN 28* 26* 27* 27*  CREATININE 1.02 0.96 0.99 0.89  CALCIUM 8.1* 8.3* 8.4 8.0*  MG 2.0  --  1.9 2.0  PHOS 4.2  --  4.0 3.6  PROT 6.3  --   --   --   ALBUMIN 1.6*  --   --   --   AST 132*  --   --   --   ALT 53*  --   --   --   ALKPHOS 135*  --   --   --   BILITOT 3.4*  --   --   --    Estimated Creatinine Clearance: 73.7 ml/min (by C-G formula based on Cr of 0.89).    Recent Labs  11/20/12 1953 11/20/12 2339 11/21/12 0420  GLUCAP 128* 128* 134*    Nutritional Goals:  RD Recommendation 5/15: Decrease Clinimix 5/15 to 50 ml/hr which will provide an average of 1058 kcal and 60 grams of protein. Provide Beneprotein 2 packet 4 times daily thru feeding tube to provide 200 kcal and 48 grams of protein. TPN @ 50 ml/hr plus Beneprotein will provide 1258 kcal and 108 grams of protein and will meet 106% of estimated calorie needs and 95% of estimated protein  needs.  Current nutrition:   Diet: NPO  IVF: NS at [facility]  Beneprotein per feeding tube   CBGs & Insulin requirements past 24 hours:   CBGs:  128-147  Resistant SSI: 18 units/ 24hrs  Insulin 30 units/2L Bag  Assessment:   67 yo F admit 5/7 with abdominal pain and on 5/8 went to OR for exp laparatomy with repair of perforated small bowel and abdominal wall debridement.  Due to diffuse peritonitis, wound closure was delayed; pt was returned to OR on 5/10 for peritoneal washout, debridement, abdominal wall closure.  Enteral feeding goal is Vital AF 1.2 cal at 30 ml/hr.  Tube feeds were started 5/10, but d/c on 5/12 d/t high residuals ( ).  Per notes, planning to resume TF once bowel function returns.  Re-intubated 5/16 with worsening pulmonary process, possible trach planned.   Labs:   Electrolytes: potassium low 3.2, replaced by MD, other electrolytes WNL  Renal function: Scr has improved to baseline, CrCl 66 ml/min  Hepatic function: AST/ALT, alk phos are  elevated/stable today. Hx of alcoholic cirrhosis noted, will monitor trend.   Pre-Albumin: 6.9 (5/13)  TG/Cholesterol: wnl (5/13)  Glucose:  Within goal range (< 150 mg/dL)  Plan:   Continue Clinimix E 5/15 at 50 ml/hr Continue Insulin in TNA to 30 units/Bag (At current rate will provide 18 units insulin over 24 hours) Fat emulsion at 10 ml/hr (MWF only due to ongoing shortage). TNA to contain standard multivitamins and trace elements (MWF only due to ongoing shortage). Continue IVF at 10-20 ml/hr. Continue resistant SSI q4h. TNA lab panels on Mondays & Thursdays.  BorgerdingLoma Messing PharmD Pager #: [phone] 7:24 AM 11/21/2012

## 2012-11-21 NOTE — Progress Notes (Signed)
PULMONARY  / CRITICAL CARE MEDICINE  Name: [name] [name] MRN: [id number] DOB: March 24, 1946    ADMISSION DATE:  11/11/2012 CONSULTATION DATE:  11/12/2012  REFERRING MD :  CCS PRIMARY SERVICE:  PCCM  CHIEF COMPLAINT:  Post op respiratory failure  BRIEF PATIENT DESCRIPTION: 67 yo with alcoholic liver disease was admitted on 5/7 with abdominal pain and was found on 5/8 to have a small bowel perforation.  She was taken to the OR and returned to the ICU with an open wound and mechanically ventilated.   SIGNIFICANT EVENTS / STUDIES:  5/7  Admitted with abdominal pain, nausea, vomiting 5/7  OR >>> Perforated viscus, peritonitis, lysis of adhesions, small bowel repair 5/16 Reintubated r/t complete left sided collapse, increased WBC  LINES / TUBES: OETT 5/8 >>>5/15>>>Reintubated 5/16>>> OGT 5/8 >>> Foley 5/8 >>> R rad A-line 5/8 >>>5/12 L IJ CVL 5/8 >>  CULTURES: 5/8 Blood >>>None 5/7 Urine >>>neg  Blood 5/15>>>NGTD Urine 5/15>>> Sputum 5/15>>> few WBC, PMN and mononuclear Sputum via bronch 5/16>>>   ANTIBIOTICS: Aztreonam 5/8 >>>5/9 Flagyl 5/8 >>>5/9 Levaquin 5/8 x 1 Cipro 5/8 x 1 ertapenem 5/9>>>5/16 Vanc 5/15>>> Primaxin 5/16>>>  Interval events: No pressors, off precedex now  VITAL SIGNS: Temp:  [98.8 F (37.1 C)-100.7 F (38.2 C)] 99.6 F (37.6 C) (05/17 0400) Pulse Rate:  [74-105] 76 (05/17 0600) Resp:  [14-26] 18 (05/17 0600) BP: (64-132)/(37-71) 96/55 mmHg (05/17 0401) SpO2:  [94 %-100 %] 99 % (05/17 0600) Arterial Line BP: (106-166)/(42-70) 146/57 mmHg (05/17 0600) FiO2 (%):  [40 %-100 %] 40 % (05/17 0330)  HEMODYNAMICS: CVP:  [7 mmHg-9 mmHg] 9 mmHg VENTILATOR SETTINGS: Vent Mode:  [-] PRVC FiO2 (%):  [40 %-100 %] 40 % Set Rate:  [14 bmp-18 bmp] 18 bmp Vt Set:  [450 mL-500 mL] 450 mL PEEP:  [5 cmH20] 5 cmH20 Plateau Pressure:  [18 cmH20-23 cmH20] 19 cmH20  INTAKE / OUTPUT: Intake/Output     05/16 0701 - 05/17 0700 05/17 0701 - 05/18 0700   P.O.      I.V. (mL/kg) 663.3 (6.8)    NG/GT 270    IV Piggyback 900    TPN 1520    Total Intake(mL/kg) 3353.3 (34.5)    Urine (mL/kg/hr) 885 (0.4)    Emesis/NG output 200 (0.1)    Stool     Total Output 1085     Net +2268.3           PHYSICAL EXAMINATION: General:  Vent , rass 0 HEENT: NCAT, minimal sleral icterus PULM: Coarse BS bases, cta CV: RRR, no mgr. AB: Central wound vac in place, no bowel sounds. ABD tenderness mild Ext: Anasarca improving, SCD's in place. Neuro: rass 0 , follows simple commands  LABS:  Recent Labs Lab 11/15/12 0932 11/18/12 0455 11/19/12 0421 11/20/12 0505 11/20/12 0959  PHART 7.354 7.516* 7.447 7.497* 7.466*  PCO2ART 43.9 39.1 47.0* 42.9 46.8*  PO2ART 85.6 86.5 116.0* 68.3* 250.0*  HCO3 23.8 31.3* 31.7* 32.9* 33.3*  TCO2 22.6 28.3 28.8 29.9 30.5  O2SAT 96.3 96.9 98.2 93.9 99.4    Recent Labs Lab 11/19/12 2000 11/20/12 0415 11/21/12 0440  NA 142 143 140  K 3.7 3.4* 3.2*  CL 104 104 105  CO2 33* 34* 32  BUN 26* 27* 27*  CREATININE 0.96 0.99 0.89  GLUCOSE 143* 150* 144*    Recent Labs Lab 11/18/12 0445 11/19/12 0409 11/20/12 0415  HGB 10.4* 10.8* 9.8*  HCT 32.4* 33.8* 31.8*  WBC 10.7* 22.8* 22.0*  PLT 100* 121* 100*   Lab Results  Component Value Date   INR 1.82* 11/21/2012   INR 2.00* 11/20/2012   INR 2.09* 11/18/2012    Recent Labs Lab 11/20/12 1342 11/20/12 1526 11/20/12 1953 11/20/12 2339 11/21/12 0420  GLUCAP 147* 137* 128* 128* 134*   CXR: 5/16 Left > Complete opacification of the left side.  ASSESSMENT / PLAN:  PULMONARY A:  Acute post operative respiratory failure.  Risk for TRALI (multiple transfusions).  - S/p extubation, PCXR shows left sided collapse.  Increasing O2 requirements, poor cough mechanics. P:   - remains aerated after bronch - - F/u cxr now and in AM - Will consider trach, unclear if will require this -abg reviewed -wean this am cpap5 ps5, goal 2 hrs -upright position -may need addition  chest pt, low threshold -keep same MV on rest  CARDIOVASCULAR A: Sepsis / septic shock secondary to peritonitis.-->resolved  P:  - Still diuresing. - avoid precedex as dropped BP -dc aline  RENAL A:  AKI - resolved, hypoK P:   - follow I&O  - Lasix restart as BP wnl - F/u chem daily - k supp  GASTROINTESTINAL A:  Alcoholic cirrhosis.  Acute alcoholic hepatitis.  Perforated viscus in jejunem s/p repair 5/8.  CT of abdomen and pelvis 5/15>>>no free air or leak. Cirrhosis, stable hepatic lobe mass (from 2013 imaging), moderate ascites present.   P:   - CCS managing. - TPN, limit when able -surgery evaluating NGT output - Protonix for GI Px. - Wound vac in place per surgery - See ID section.  HEMATOLOGIC A:  Coagulopathy - presume r/t liver disease. Significant intraoperative bleeding controlled with platelets and plasma. Vitamin K given on 5/14       Post-op anemia: hgb holding s/p transfusions P:  - cbc in am  INR goal < 2 met, no bleeding noted  INFECTIOUS No results found for this basename: PROCALCITON,  in the last 168 hours A:  Peritonitis.  PCN / Cephalosporin allergy.  WBC remains elevated, new collapse r/o mucous plug vs PNA / VAP P:   - Cultures and antibiotics as per flowsheet above. - maintain current regimen, follow bronch bal  ENDOCRINE  A:  Hypoglycemia. P:   - SSI prn  NEUROLOGIC A:  Acute encephalopathy. H/o Alcohol abuse. Post op pain. At risk for alcohol withdrawal. P:   - Goal RASS  -1 to -2. - Thiamin / Folate. - PRN fentanyl. - Precedex for sedation Avoid benxo drips  TODAY'S SUMMARY:   I have personally obtained a history, examined the patient, evaluated laboratory and imaging results, formulated the assessment and plan and placed orders.  CRITICAL CARE:  The patient is critically ill with multiple organ systems failure and requires high complexity decision making for assessment and support, frequent evaluation and titration of  therapies, application of advanced monitoring technologies and extensive interpretation of multiple databases. Critical Care Time devoted to patient care services described in this note is 35 minutes.   [name]. Tyson Alias, MD, FACP Pgr: [phone] [place]

## 2012-11-21 NOTE — Progress Notes (Signed)
[facility] ADULT ICU REPLACEMENT PROTOCOL FOR AM LAB REPLACEMENT ONLY  The patient  does not apply for the [facility] Adult ICU Electrolyte Replacment Protocol based on the criteria listed below:    2. Is urine output >/= 0.5 ml/kg/hr for the last 6 hours? no Patient's UOP is 0.39 ml/kg/hr  4. Abnormal electrolyte(s): K+ 3.2  6. If a panic level lab has been reported, has the CCM MD in charge been notified? yes.   Physician:  Dr. Tyson Babinski, Donell Sliwinski A 11/21/2012 6:42 AM

## 2012-11-21 NOTE — Progress Notes (Signed)
Patient ID: SAFIA PANZER, female   DOB: 1945/10/19, 67 y.o.   MRN: [id number] 7 Days Post-Op  Subjective: Very alert and responsive on vent, weaning in progress.  Denies SOB or abdominal pain  Objective: Vital signs in last 24 hours: Temp:  [98.8 F (37.1 C)-100.7 F (38.2 C)] 99.6 F (37.6 C) (05/17 0400) Pulse Rate:  [74-105] 76 (05/17 0600) Resp:  [14-26] 18 (05/17 0600) BP: (64-132)/(37-71) 96/55 mmHg (05/17 0401) SpO2:  [94 %-100 %] 99 % (05/17 0600) Arterial Line BP: (106-166)/(42-70) 146/57 mmHg (05/17 0600) FiO2 (%):  [40 %-100 %] 40 % (05/17 0330) Last BM Date: 11/20/12  Intake/Output from previous day: 05/16 0701 - 05/17 0700 In: 3353.3 [I.V.:663.3; NG/GT:270; IV Piggyback:900; TPN:1520] Out: 1085 [Urine:885; Emesis/NG output:200] Intake/Output this shift:    General appearance: alert, no distress and responsive on vent Resp: clear to auscultation bilaterally GI: normal findings: soft, non-tender Incision/Wound: VAC in place, clean and dry  Lab Results:   Recent Labs  11/19/12 0409 11/20/12 0415  WBC 22.8* 22.0*  HGB 10.8* 9.8*  HCT 33.8* 31.8*  PLT 121* 100*   BMET  Recent Labs  11/20/12 0415 11/21/12 0440  NA 143 140  K 3.4* 3.2*  CL 104 105  CO2 34* 32  GLUCOSE 150* 144*  BUN 27* 27*  CREATININE 0.99 0.89  CALCIUM 8.4 8.0*     Studies/Results: Ct Abdomen Pelvis W Contrast  11/19/2012   *RADIOLOGY REPORT*  Clinical Data: Abdominal pain, leukocytosis, recent surgery for perforated small bowel  CT ABDOMEN AND PELVIS WITH CONTRAST  Technique:  Multidetector CT imaging of the abdomen and pelvis was performed following the standard protocol during bolus administration of intravenous contrast.  Contrast: OMNIPAQUE IOHEXOL 300 MG/ML  SOLN  Comparison: 09/18/2012  Findings: Motion degraded images.  Mild patchy opacity in the right lower lobe (series 5/image 7), suspicious for pneumonia.  Additional patchy opacity in the left lower lobe  (series 5/image 8), suspicious for pneumonia, less likely compressive atelectasis.  Right basilar opacity, likely atelectasis.  Small left and trace right pleural effusions.  Cirrhotic configuration of the liver.  1.9 x 1.5 cm hypoenhancing lesion in the lateral segment left hepatic lobe, previously favored to reflect a benign hemangioma although indeterminate, unchanged from 2013.  Spleen, pancreas, and adrenal glands are within normal limits.  Small layering gallstones (series 2/image 38).  No associated inflammatory changes by CT.  Kidneys are unremarkable.  No renal calculi or hydronephrosis.  No evidence of bowel obstruction.  No contrast extravasation to suggest leak/perforation following surgery.  Colonic diverticulosis, without associate inflammatory changes.  No free air.  Atherosclerotic calcifications of the abdominal aorta and branch vessels. Shunt/collateral vessel in the left mid abdomen (series 2/image 60), between the portosplenic confluence and the IVC. Portal vein is patent.  Moderate abdominopelvic ascites.  Uterus is mildly heterogeneous, possibly reflecting uterine fibroids.  No adnexal masses.  Bladder decompressed by indwelling Foley catheter.  Postsurgical changes in the midline anterior abdominal wall.  Body wall edema.  Degenerative changes of the visualized thoracolumbar spine.  IMPRESSION: No oral contrast extravasation to suggest leak/perforation.  No free air.  Cirrhosis.  Venous collaterals between the portable at confluence in the IVC.  Portal vein remains patent.  1.9 x 1.5 cm hypoenhancing lesion in the lateral segment left hepatic lobe, indeterminate, unchanged since 2013.  Correlate with serum AFP and consider follow-up MRI abdomen with/without contrast for further characterization as an outpatient.  Moderate abdominopelvic ascites.  Body wall  edema.  Suspected right middle lobe and possible left lower lobe pneumonia. Small left and trace right pleural effusions.  Additional  ancillary findings as above.   Original Report Authenticated By: Charline Bills, M.D.   Portable Chest Xray  11/20/2012   *RADIOLOGY REPORT*  Clinical Data: Endotracheal tube placement.  PORTABLE CHEST - 1 VIEW  Comparison: 11/20/2012 5:00 a.m. and 11/19/2012.  Findings: Endotracheal tube has been placed with the tip 3.5 cm above the carina.  Improved aeration of the left lung with residual left base atelectasis and / or left-sided pleural effusion. Decrease in degree of midline shift to the left.  Left base infiltrate would be difficult to exclude with the presence of atelectasis.  Left central line tip distal superior vena cava level.  No gross pneumothorax.  Pulmonary vascular congestion/pulmonary edema most notable centrally.  Nasogastric tube courses below the diaphragm.  The tip is not included on this exam.  Heart size difficult to adequately assessed.  Mitral valve calcifications.  Calcification of the aorta.  IMPRESSION: Endotracheal tube has been placed with the tip 3.5 cm above the carina.  Improved aeration of the left lung with residual left base atelectasis and / or left-sided pleural effusion. Decrease in degree of midline shift to the left.  Pulmonary vascular congestion/ pulmonary edema most notable centrally.  This is a call report.   Original Report Authenticated By: Lacy Duverney, M.D.   Dg Chest Port 1 View  11/20/2012   *RADIOLOGY REPORT*  Clinical Data: Evaluate endotracheal tube placement.  PORTABLE CHEST - 1 VIEW  Comparison: 11/19/2012  Findings: Endotracheal tube has been removed.  There is now complete opacification of the left hemithorax.  Findings are most compatible with volume loss and mucous plugging.  Mediastinal shift towards the left.  Nasogastric tube extends into the abdomen.  Left jugular central line is in the SVC region.  Streaky densities at the right lung base are most compatible atelectasis.  IMPRESSION: Complete opacification of the left hemithorax is most  compatible with volume loss and mucous plugging.  These results were called by telephone on 11/20/2012 at 01/1943 and to the patient's nurse, Kennyth Arnold, who verbally acknowledged these results.   Original Report Authenticated By: Richarda Overlie, M.D.    Anti-infectives: Anti-infectives   Start     Dose/Rate Route Frequency Ordered Stop   11/20/12 0900  imipenem-cilastatin (PRIMAXIN) 500 mg in sodium chloride 0.9 % 100 mL IVPB     500 mg 200 mL/hr over 30 Minutes Intravenous Every 8 hours 11/20/12 0856     11/19/12 1000  vancomycin (VANCOCIN) 1,250 mg in sodium chloride 0.9 % 250 mL IVPB     1,250 mg 166.7 mL/hr over 90 Minutes Intravenous Every 12 hours 11/19/12 0920     11/13/12 1500  ertapenem (INVANZ) 1 g in sodium chloride 0.9 % 50 mL IVPB  Status:  Discontinued     1 g 100 mL/hr over 30 Minutes Intravenous Every 24 hours 11/12/12 1758 11/20/12 0852   11/13/12 1000  Levofloxacin (LEVAQUIN) IVPB 250 mg  Status:  Discontinued     250 mg 50 mL/hr over 60 Minutes Intravenous Every 24 hours 11/12/12 0953 11/12/12 1259   11/12/12 2200  ciprofloxacin (CIPRO) IVPB 400 mg  Status:  Discontinued     400 mg 200 mL/hr over 60 Minutes Intravenous Every 12 hours 11/12/12 1308 11/12/12 1747   11/12/12 1830  aztreonam (AZACTAM) 1 g in dextrose 5 % 50 mL IVPB  Status:  Discontinued  1 g 100 mL/hr over 30 Minutes Intravenous Every 8 hours 11/12/12 1811 11/13/12 0931   11/12/12 1446  ertapenem (INVANZ) 1 g in sodium chloride 0.9 % 50 mL IVPB  Status:  Discontinued    Comments:  stat   1 g 100 mL/hr over 30 Minutes Intravenous 60 min pre-op 11/12/12 1444 11/12/12 1700   11/12/12 1400  metroNIDAZOLE (FLAGYL) IVPB 500 mg  Status:  Discontinued     500 mg 100 mL/hr over 60 Minutes Intravenous Every 8 hours 11/12/12 1308 11/13/12 0911   11/12/12 1000  levofloxacin (LEVAQUIN) IVPB 500 mg     500 mg 100 mL/hr over 60 Minutes Intravenous  Once 11/12/12 0942 11/12/12 1157      Assessment/Plan: s/p  Procedure(s): Washout Abdominal Wound, debridement of Fascia, Partial Closure Abdomen benign and recent negative CT.  Minimal NG output and has had BMs, will start slow TF VDRF, re intubated due to atelectasis, now weaning   LOS: 10 days    Keath Matera T 11/21/2012

## 2012-11-21 NOTE — Progress Notes (Signed)
ANTIBIOTIC CONSULT NOTE - Follow up   Pharmacy Consult for Vancomycin/Primaxin  Indication: PNA  Allergies  Allergen Reactions  . Cephalexin Rash  . Penicillins Rash    Patient Measurements: Height: 5\' 5"  (165.1 cm) Weight: 214 lb 8.1 oz (97.3 kg) IBW/kg (Calculated) : 57   Vital Signs: Temp: 97.8 F (36.6 C) (05/17 0800) Temp src: Oral (05/17 0800) BP: 118/59 mmHg (05/17 0900) Pulse Rate: 82 (05/17 0900) Intake/Output from previous day: 05/16 0701 - 05/17 0700 In: 3353.3 [I.V.:663.3; NG/GT:270; IV Piggyback:900; TPN:1520] Out: 1085 [Urine:885; Emesis/NG output:200] Intake/Output from this shift: Total I/O In: 310 [I.V.:40; IV Piggyback:150; TPN:120] Out: 125 [Urine:125]  Labs:  Recent Labs  11/19/12 0409 11/19/12 2000 11/20/12 0415 11/21/12 0440  WBC 22.8*  --  22.0*  --   HGB 10.8*  --  9.8*  --   PLT 121*  --  100*  --   CREATININE 1.02 0.96 0.99 0.89   Estimated Creatinine Clearance: 73.7 ml/min (by C-G formula based on Cr of 0.89).  Recent Labs  11/21/12 0913  VANCOTROUGH 24.6*     Microbiology: Recent Results (from the past 720 hour(s))  URINE CULTURE     Status: None   Collection Time    11/11/12  9:55 PM      Result Value Range Status   Specimen Description URINE, CATHETERIZED   Final   Special Requests NONE   Final   Culture  Setup Time 11/12/2012 06:03   Final   Colony Count NO GROWTH   Final   Culture NO GROWTH   Final   Report Status 11/13/2012 FINAL   Final  MRSA PCR SCREENING     Status: None   Collection Time    11/12/12  1:28 PM      Result Value Range Status   MRSA by PCR NEGATIVE  NEGATIVE Final   Comment:            The GeneXpert MRSA Assay (FDA     approved for NASAL specimens     only), is one component of a     comprehensive MRSA colonization     surveillance program. It is not     intended to diagnose MRSA     infection nor to guide or     monitor treatment for     MRSA infections.  CULTURE, BLOOD (ROUTINE X 2)      Status: None   Collection Time    11/19/12  9:30 AM      Result Value Range Status   Specimen Description BLOOD RIGHT ARM   Final   Special Requests BOTTLES DRAWN AEROBIC AND ANAEROBIC 8CC   Final   Culture  Setup Time 11/19/2012 12:44   Final   Culture     Final   Value:        BLOOD CULTURE RECEIVED NO GROWTH TO DATE CULTURE WILL BE HELD FOR 5 DAYS BEFORE ISSUING A FINAL NEGATIVE REPORT   Report Status PENDING   Incomplete  URINE CULTURE     Status: None   Collection Time    11/19/12  9:35 AM      Result Value Range Status   Specimen Description URINE, CATHETERIZED   Final   Special Requests Normal   Final   Culture  Setup Time 11/19/2012 13:48   Final   Colony Count NO GROWTH   Final   Culture NO GROWTH   Final   Report Status 11/20/2012 FINAL   Final  CULTURE,  BLOOD (ROUTINE X 2)     Status: None   Collection Time    11/19/12  9:40 AM      Result Value Range Status   Specimen Description BLOOD RIGHT HAND   Final   Special Requests BOTTLES DRAWN AEROBIC ONLY 2CC   Final   Culture  Setup Time 11/19/2012 12:44   Final   Culture     Final   Value:        BLOOD CULTURE RECEIVED NO GROWTH TO DATE CULTURE WILL BE HELD FOR 5 DAYS BEFORE ISSUING A FINAL NEGATIVE REPORT   Report Status PENDING   Incomplete  CULTURE, RESPIRATORY (NON-EXPECTORATED)     Status: None   Collection Time    11/19/12 10:50 AM      Result Value Range Status   Specimen Description TRACHEAL ASPIRATE   Final   Special Requests NONE   Final   Gram Stain     Final   Value: FEW WBC PRESENT,BOTH PMN AND MONONUCLEAR     NO SQUAMOUS EPITHELIAL CELLS SEEN     NO ORGANISMS SEEN   Culture Non-Pathogenic Oropharyngeal-type Flora Isolated.   Final   Report Status PENDING   Incomplete  CULTURE, RESPIRATORY (NON-EXPECTORATED)     Status: None   Collection Time    11/20/12 10:56 AM      Result Value Range Status   Specimen Description TRACHEAL ASPIRATE   Final   Special Requests Normal   Final   Gram Stain     Final    Value: FEW WBC PRESENT,BOTH PMN AND MONONUCLEAR     NO SQUAMOUS EPITHELIAL CELLS SEEN     NO ORGANISMS SEEN   Culture PENDING   Incomplete   Report Status PENDING   Incomplete    Medical History: Past Medical History  Diagnosis Date  . Hypertension   . H/O brain surgery   . Aneurysm of anterior cerebral artery   . Hernia   . Anxiety   . Stroke   . Anemia   . Arthritis     Medications:  Scheduled:  . albuterol  6 puff Inhalation Q6H  . antiseptic oral rinse  15 mL Mouth Rinse QID  . chlorhexidine  15 mL Mouth Rinse BID  . feeding supplement (JEVITY 1.2 CAL)  1,000 mL Per Tube Q24H  . folic acid  1 mg Intravenous Daily  . furosemide  20 mg Intravenous Q12H  . imipenem-cilastatin  500 mg Intravenous Q8H  . insulin aspart  0-20 Units Subcutaneous Q4H  . labetalol  100 mg Oral TID  . lip balm  1 application Topical BID  . pantoprazole (PROTONIX) IV  40 mg Intravenous Q12H  . potassium chloride  10 mEq Intravenous Q1 Hr x 4  . protein supplement  12 g Oral QID  . rocuronium  50 mg Intravenous Once  . thiamine  100 mg Intravenous Daily  . vancomycin  1,250 mg Intravenous Q12H   Infusions:  . sodium chloride 20 mL/hr at 11/21/12 0133  . dexmedetomidine Stopped (11/20/12 1150)  . fat emulsion 250 mL (11/20/12 1900)  . norepinephrine (LEVOPHED) Adult infusion Stopped (11/20/12 1415)  . Marland KitchenTPN (CLINIMIX-E) Adult 50 mL/hr at 11/20/12 1724  . Marland KitchenTPN (CLINIMIX-E) Adult     PRN: bisacodyl, fentaNYL, hydrALAZINE, ondansetron (ZOFRAN) IV Assessment:  67 yo F on D#3 Vancomycin/D#2 Primaxin for PNA, also s/p repair of perforation with peritonitis.  Vancomycin trough this morning (24.6) is above goal 15-20.  Will reduce dose accordingly  Urine and blood culture NGTD; Trach aspirate from 5/16 pending   Yesterday WBC remained elevated 22; AF; Scr is WNL with CrCl 73 ml/min  Goal of Therapy:  Vancomycin trough level 15-20 mcg/ml Primaxin per renal function/weight   Plan:  1.)  Decrease Vancomycin to 1 gram IV q12h, will delay next dose to be given at 1400  2.) Continue Primaxin  3.) monitor renal function, repeat vancomycin troughs as needed  Guiseppe Flanagan, Loma Messing PharmD Pager #: [phone] 10:57 AM 11/21/2012

## 2012-11-22 ENCOUNTER — Inpatient Hospital Stay (HOSPITAL_COMMUNITY): Payer: BC Managed Care – PPO

## 2012-11-22 DIAGNOSIS — J690 Pneumonitis due to inhalation of food and vomit: Secondary | ICD-10-CM

## 2012-11-22 LAB — CBC WITH DIFFERENTIAL/PLATELET
Basophils Absolute: 0 10*3/uL (ref 0.0–0.1)
Eosinophils Absolute: 0.2 10*3/uL (ref 0.0–0.7)
Eosinophils Relative: 1 % (ref 0–5)
Lymphocytes Relative: 6 % — ABNORMAL LOW (ref 12–46)
MCV: 95.1 fL (ref 78.0–100.0)
Neutrophils Relative %: 85 % — ABNORMAL HIGH (ref 43–77)
Platelets: 103 10*3/uL — ABNORMAL LOW (ref 150–400)
RDW: 19.2 % — ABNORMAL HIGH (ref 11.5–15.5)
WBC: 13.8 10*3/uL — ABNORMAL HIGH (ref 4.0–10.5)

## 2012-11-22 LAB — GLUCOSE, CAPILLARY
Glucose-Capillary: 133 mg/dL — ABNORMAL HIGH (ref 70–99)
Glucose-Capillary: 139 mg/dL — ABNORMAL HIGH (ref 70–99)
Glucose-Capillary: 130 mg/dL — ABNORMAL HIGH (ref 70–99)

## 2012-11-22 LAB — COMPREHENSIVE METABOLIC PANEL
Albumin: 1.4 g/dL — ABNORMAL LOW (ref 3.5–5.2)
Alkaline Phosphatase: 117 U/L (ref 39–117)
BUN: 26 mg/dL — ABNORMAL HIGH (ref 6–23)
CO2: 32 mEq/L (ref 19–32)
Chloride: 104 mEq/L (ref 96–112)
Creatinine, Ser: 0.87 mg/dL (ref 0.50–1.10)
GFR calc Af Amer: 80 mL/min — ABNORMAL LOW (ref >90)
GFR calc non Af Amer: 69 mL/min — ABNORMAL LOW (ref >90)
Glucose, Bld: 164 mg/dL — ABNORMAL HIGH (ref 70–99)
Potassium: 3.1 mEq/L — ABNORMAL LOW (ref 3.5–5.1)
Total Bilirubin: 2.9 mg/dL — ABNORMAL HIGH (ref 0.3–1.2)

## 2012-11-22 LAB — CULTURE, RESPIRATORY W GRAM STAIN

## 2012-11-22 MED ORDER — ALBUTEROL SULFATE (5 MG/ML) 0.5% IN NEBU
2.5000 mg | INHALATION_SOLUTION | Freq: Four times a day (QID) | RESPIRATORY_TRACT | Status: DC
  Administered 2012-11-22 – 2012-12-02 (×35): 2.5 mg via RESPIRATORY_TRACT
  Filled 2012-11-22 (×36): qty 0.5

## 2012-11-22 MED ORDER — SUCCINYLCHOLINE CHLORIDE 20 MG/ML IJ SOLN
INTRAMUSCULAR | Status: AC
  Filled 2012-11-22: qty 1

## 2012-11-22 MED ORDER — INSULIN REGULAR HUMAN 100 UNIT/ML IJ SOLN
INTRAVENOUS | Status: AC
  Administered 2012-11-22: 17:00:00 via INTRAVENOUS
  Filled 2012-11-22: qty 2000

## 2012-11-22 MED ORDER — ROCURONIUM BROMIDE 50 MG/5ML IV SOLN
INTRAVENOUS | Status: AC
  Filled 2012-11-22: qty 2

## 2012-11-22 MED ORDER — POTASSIUM CHLORIDE 10 MEQ/50ML IV SOLN
10.0000 meq | INTRAVENOUS | Status: AC
  Administered 2012-11-22 (×6): 10 meq via INTRAVENOUS
  Filled 2012-11-22 (×6): qty 50

## 2012-11-22 MED ORDER — JEVITY 1.2 CAL PO LIQD
1000.0000 mL | ORAL | Status: DC
  Administered 2012-11-22 – 2012-11-24 (×3): 1000 mL

## 2012-11-22 MED ORDER — ETOMIDATE 2 MG/ML IV SOLN
INTRAVENOUS | Status: AC
  Administered 2012-11-22: 15 mg
  Filled 2012-11-22: qty 20

## 2012-11-22 MED ORDER — FUROSEMIDE 10 MG/ML IJ SOLN
40.0000 mg | Freq: Two times a day (BID) | INTRAMUSCULAR | Status: AC
  Administered 2012-11-22 (×2): 40 mg via INTRAVENOUS
  Filled 2012-11-22 (×2): qty 4

## 2012-11-22 MED ORDER — LIDOCAINE HCL (CARDIAC) 20 MG/ML IV SOLN
INTRAVENOUS | Status: AC
  Filled 2012-11-22: qty 5

## 2012-11-22 NOTE — Progress Notes (Signed)
PARENTERAL NUTRITION CONSULT NOTE   Pharmacy Consult for TPN Indication: Intolerance to enteral feeding, s/p repair of bowel perforation  Allergies  Allergen Reactions  . Cephalexin Rash  . Penicillins Rash    Patient Measurements: Height: 5\' 5"  (165.1 cm) Weight: 214 lb 8.1 oz (97.3 kg) IBW/kg (Calculated) : 57  Vital Signs: Temp: 98.2 F (36.8 C) (05/18 0400) Temp src: Oral (05/18 0400) BP: 100/41 mmHg (05/18 0700) Pulse Rate: 76 (05/18 0700) Intake/Output from previous day: 05/17 0701 - 05/18 0700 In: 3270 [I.V.:640; NG/GT:370; IV Piggyback:1000; TPN:1260] Out: 2235 [Urine:2235]  Labs:  Recent Labs  11/20/12 0415 11/20/12 0837 11/21/12 0440 11/22/12 0400  WBC 22.0*  --   --  13.8*  HGB 9.8*  --   --  8.4*  HCT 31.8*  --   --  27.3*  PLT 100*  --   --  103*  INR  --  2.00* 1.82*  --      Recent Labs  11/20/12 0415 11/21/12 0440 11/22/12 0400  NA 143 140 138  K 3.4* 3.2* 3.1*  CL 104 105 104  CO2 34* 32 32  GLUCOSE 150* 144* 164*  BUN 27* 27* 26*  CREATININE 0.99 0.89 0.87  CALCIUM 8.4 8.0* 7.9*  MG 1.9 2.0  --   PHOS 4.0 3.6  --   PROT  --   --  5.8*  ALBUMIN  --   --  1.4*  AST  --   --  77*  ALT  --   --  33  ALKPHOS  --   --  117  BILITOT  --   --  2.9*   Estimated Creatinine Clearance: 75.4 ml/min (by C-G formula based on Cr of 0.87).    Recent Labs  11/21/12 1936 11/22/12 0002 11/22/12 0348  GLUCAP 137* 150* 159*    Nutritional Goals:  RD Recommendation 5/15: Decrease Clinimix 5/15 to 50 ml/hr which will provide an average of 1058 kcal and 60 grams of protein. Provide Beneprotein 2 packet 4 times daily thru feeding tube to provide 200 kcal and 48 grams of protein. TPN @ 50 ml/hr plus Beneprotein will provide 1258 kcal and 108 grams of protein and will meet 106% of estimated calorie needs and 95% of estimated protein needs.  Current nutrition:   Diet: NPO  IVF: NS at [facility]  Beneprotein per feeding tube   CBGs & Insulin  requirements past 24 hours:   CBGs:  126-159  Resistant SSI: 19 units/ 24hrs  Insulin 30 units/2L Bag  Assessment:   67 yo F admit 5/7 with abdominal pain and on 5/8 went to OR for exp laparatomy with repair of perforated small bowel and abdominal wall debridement.  Due to diffuse peritonitis, wound closure was delayed; pt was returned to OR on 5/10 for peritoneal washout, debridement, abdominal wall closure.  Enteral feeding goal is Vital AF 1.2 cal at 30 ml/hr.  Tube feeds were started 5/10, but d/c on 5/12 d/t high residuals ( ).  Per notes, planning to resume TF once bowel function returns.  Re-intubated 5/16 with worsening pulmonary process, possible trach planned.   TF Re-initiated on 5/17 - Patient has tolerated without high residual, advancing today per surgery to 20 cc/hr - Will f/u TNA wean vs. TF tolerance  Labs:   Electrolytes: potassium low 3.1, replaced by MD, other electrolytes WNL  Renal function: Scr has improved to baseline, CrCl 66 ml/min  Hepatic function: AST/ALT, alk phos are elevated/stable today. Hx of alcoholic  cirrhosis noted, will monitor trend.   Pre-Albumin: 6.9 (5/13)  TG/Cholesterol: wnl (5/13)  Glucose:  Slightly above goal range (< 150 mg/dL) - will adjust insulin in TNA  Plan:   Continue Clinimix E 5/15 at 50 ml/hr Increase Insulin in TNA to 45 units/Bag (At current rate will provide 27 units insulin over 24 hours) F/U Tolerance of TF and plan for transition off of TNA Fat emulsion at 10 ml/hr (MWF only due to ongoing shortage). TNA to contain standard multivitamins and trace elements (MWF only due to ongoing shortage). Continue IVF at 10-20 ml/hr. Continue resistant SSI q4h. TNA lab panels on Mondays & Thursdays.  BorgerdingLoma Messing PharmD Pager #: [phone] 7:44 AM 11/22/2012

## 2012-11-22 NOTE — Progress Notes (Signed)
[facility] ADULT ICU REPLACEMENT PROTOCOL FOR AM LAB REPLACEMENT ONLY  The patient does not apply for the [facility] Adult ICU Electrolyte Replacment Protocol based on the criteria listed below:  . Is urine output >/= 0.5 ml/kg/hr for the last 6 hours? no Patient's UOP is 0.2 ml/kg/hr . Abnormal electrolyte(s)K+3.1 5. Ordered repletion with: NONE 6. If a panic level lab has been reported, has the CCM MD in charge been notified? yes.   Physician:  Robert Bellow [facility] 11/22/2012 6:04 AMli

## 2012-11-22 NOTE — Progress Notes (Addendum)
PULMONARY  / CRITICAL CARE MEDICINE  Name: [name] [name] MRN: [id number] DOB: 02-13-1946    ADMISSION DATE:  11/11/2012 CONSULTATION DATE:  11/12/2012  REFERRING MD :  CCS PRIMARY SERVICE:  PCCM  CHIEF COMPLAINT:  Post op respiratory failure  BRIEF PATIENT DESCRIPTION: 67 yo with alcoholic liver disease was admitted on 5/7 with abdominal pain and was found on 5/8 to have a small bowel perforation.  She was taken to the OR and returned to the ICU with an open wound and mechanically ventilated.   SIGNIFICANT EVENTS / STUDIES:  5/7  Admitted with abdominal pain, nausea, vomiting 5/7  OR >>> Perforated viscus, peritonitis, lysis of adhesions, small bowel repair 5/16 Reintubated r/t complete left sided collapse, increased WBC  LINES / TUBES: OETT 5/8 >>>5/15>>>Reintubated 5/16>>> OGT 5/8 >>> Foley 5/8 >>> R rad A-line 5/8 >>>5/12 L IJ CVL 5/8 >>  CULTURES: 5/8 Blood >>>None 5/7 Urine >>>neg  Blood 5/15>>>NGTD Urine 5/15>>> Sputum 5/15>>> few WBC, PMN and mononuclear Sputum via bronch 5/16>>>   ANTIBIOTICS: Aztreonam 5/8 >>>5/9 Flagyl 5/8 >>>5/9 Levaquin 5/8 x 1 Cipro 5/8 x 1 ertapenem 5/9>>>5/16 Vanc 5/15>>> Primaxin 5/16>>>  Interval events: Awake, alert, pos 1 liter  VITAL SIGNS: Temp:  [97.8 F (36.6 C)-98.8 F (37.1 C)] 98.2 F (36.8 C) (05/18 0400) Pulse Rate:  [76-95] 76 (05/18 0700) Resp:  [17-21] 17 (05/18 0700) BP: (93-138)/(41-72) 100/41 mmHg (05/18 0700) SpO2:  [96 %-100 %] 97 % (05/18 0700) Arterial Line BP: (137)/(52) 137/52 mmHg (05/17 0800) FiO2 (%):  [30 %-40 %] 30 % (05/18 0400)  HEMODYNAMICS: CVP:  [7 mmHg-14 mmHg] 14 mmHg VENTILATOR SETTINGS: Vent Mode:  [-] PRVC FiO2 (%):  [30 %-40 %] 30 % Set Rate:  [18 bmp] 18 bmp Vt Set:  [450 mL] 450 mL PEEP:  [5 cmH20] 5 cmH20 Plateau Pressure:  [20 cmH20-25 cmH20] 25 cmH20  INTAKE / OUTPUT: Intake/Output     05/17 0701 - 05/18 0700 05/18 0701 - 05/19 0700   I.V. (mL/kg) 640 (6.6)    NG/GT  370    IV Piggyback 1000    TPN 1260    Total Intake(mL/kg) 3270 (33.6)    Urine (mL/kg/hr) 2235 (1)    Emesis/NG output     Total Output 2235     Net +1035          Stool Occurrence 1 x     PHYSICAL EXAMINATION: General:  Vent , rass 0 HEENT: NCAT, minimal sleral icterus PULM: good BS to bases CV: RRR, no mgr AB: Central wound vac in place, no bowel sounds. ABD tenderness none Ext: Anasarca improving, SCD's in place. Neuro: rass 0 , follows simple commands  LABS:  Recent Labs Lab 11/15/12 0932 11/18/12 0455 11/19/12 0421 11/20/12 0505 11/20/12 0959  PHART 7.354 7.516* 7.447 7.497* 7.466*  PCO2ART 43.9 39.1 47.0* 42.9 46.8*  PO2ART 85.6 86.5 116.0* 68.3* 250.0*  HCO3 23.8 31.3* 31.7* 32.9* 33.3*  TCO2 22.6 28.3 28.8 29.9 30.5  O2SAT 96.3 96.9 98.2 93.9 99.4    Recent Labs Lab 11/20/12 0415 11/21/12 0440 11/22/12 0400  NA 143 140 138  K 3.4* 3.2* 3.1*  CL 104 105 104  CO2 34* 32 32  BUN 27* 27* 26*  CREATININE 0.99 0.89 0.87  GLUCOSE 150* 144* 164*    Recent Labs Lab 11/19/12 0409 11/20/12 0415 11/22/12 0400  HGB 10.8* 9.8* 8.4*  HCT 33.8* 31.8* 27.3*  WBC 22.8* 22.0* 13.8*  PLT 121* 100* 103*  Lab Results  Component Value Date   INR 1.82* 11/21/2012   INR 2.00* 11/20/2012   INR 2.09* 11/18/2012    Recent Labs Lab 11/21/12 1155 11/21/12 1624 11/21/12 1936 11/22/12 0002 11/22/12 0348  GLUCAP 125* 126* 137* 150* 159*   CXR: 5/18- open lung , nno sig atx, ett wnl  ASSESSMENT / PLAN:  PULMONARY A:  Acute post operative respiratory failure.  Risk for TRALI (multiple transfusions).  - S/p extubation, PCXR shows left sided collapse.  Increasing O2 requirements, poor cough mechanics. P:   - Sit in a chair position -wean cpap5 ps 5, goal 30 min , will consider extubation, will consider rebronch evaluation -assess nif, vc -favor neg balance -chest pt to dc -pcxr in am   CARDIOVASCULAR A: Sepsis / septic shock secondary to  peritonitis.-->resolved  P:  - avoid precedex as dropped BP -would maintain neg balance  RENAL A:  AKI - resolved, hypoK P:   - follow I&O  - Lasix restart increase - F/u chem - k supp  GASTROINTESTINAL A:  Alcoholic cirrhosis.  Acute alcoholic hepatitis.  Perforated viscus in jejunem s/p repair 5/8.  CT of abdomen and pelvis 5/15>>>no free air or leak. Cirrhosis, stable hepatic lobe mass (from 2013 imaging), moderate ascites present.   P:   - CCS managing. - TPN, limit when able, for am labs TPN wise -surgery allowing TF - Protonix for GI Px. - Wound vac in place per surgery - See ID section.  HEMATOLOGIC A:  Coagulopathy - presume r/t liver disease. Significant intraoperative bleeding controlled with platelets and plasma. Vitamin K given on 5/14       Post-op anemia: hgb holding s/p transfusions P:  - cbc in am per tpn labs -inr up on own, but no bleeding  INFECTIOUS No results found for this basename: PROCALCITON,  in the last 168 hours A:  Peritonitis.  PCN / Cephalosporin allergy.  WBC remains elevated, new collapse r/o mucous plug vs PNA / VAP P:   - Cultures and antibiotics as per flowsheet above. -dc vanc in am if remains culture neg - maintain current regimen, follow bronch bal  ENDOCRINE  A:  Hypoglycemia. P:   - SSI prn  NEUROLOGIC A:  Acute encephalopathy. H/o Alcohol abuse. Post op pain. At risk for alcohol withdrawal. Has reversal sleep wake cycle P:   - Goal RASS  0, met - Thiamin / Folate. - PRN fentanyl. Avoid benxo drips Consider pm rispirdal when gut allows  TODAY'S SUMMARY:   I have personally obtained a history, examined the patient, evaluated laboratory and imaging results, formulated the assessment and plan and placed orders.  CRITICAL CARE:  The patient is critically ill with multiple organ systems failure and requires high complexity decision making for assessment and support, frequent evaluation and titration of therapies,  application of advanced monitoring technologies and extensive interpretation of multiple databases. Critical Care Time devoted to patient care services described in this note is 35 minutes.   [name]. Tyson Alias, MD, FACP Pgr: [phone] [facility]

## 2012-11-22 NOTE — Progress Notes (Signed)
Pt extubated to 2l.

## 2012-11-22 NOTE — Procedures (Signed)
Bronchoscopy Procedure Note [name] [name] [id number] 01-Dec-1945  Procedure: Bronchoscopy Indications: Diagnostic evaluation of the airways  Procedure Details Consent: Risks of procedure as well as the alternatives and risks of each were explained to the (patient/caregiver).  Consent for procedure obtained. Time Out: Verified patient identification, verified procedure, site/side was marked, verified correct patient position, special equipment/implants available, medications/allergies/relevent history reviewed, required imaging and test results available.  Performed  In preparation for procedure, patient was given 100% FiO2 and bronchoscope lubricated. Sedation: Etomidate  Airway entered and the following bronchi were examined: RUL, RML, RLL, LUL, LLL and Bronchi.   Procedures performed: Brushings performed Bronchoscope removed.    Evaluation Hemodynamic Status: BP stable throughout; O2 sats: stable throughout Patient's Current Condition: stable Specimens:  None Complications: No apparent complications Patient did tolerate procedure well.   [name] [name]. 11/22/2012  1. No mucous at all, no obstruction  [name] [name]. [name] Alias, MD, FACP Pgr: [phone] [facility]

## 2012-11-22 NOTE — Progress Notes (Signed)
Patient ID: [name] [name], female   DOB: 05/19/1946, 67 y.o.   MRN: [id number] Patient ID: [name] [name], female   DOB: [birth year], 67 y.o.   MRN: [id number] 8 Days Post-Op  Subjective: Very alert and responsive on vent, weaning in progress.  Denies SOB or abdominal pain.  Has tolerated TF without high residual  Objective: Vital signs in last 24 hours: Temp:  [97.8 F (36.6 C)-98.8 F (37.1 C)] 98.2 F (36.8 C) (05/18 0400) Pulse Rate:  [76-95] 76 (05/18 0700) Resp:  [17-21] 17 (05/18 0700) BP: (93-138)/(41-72) 100/41 mmHg (05/18 0700) SpO2:  [96 %-100 %] 97 % (05/18 0700) Arterial Line BP: (137)/(52) 137/52 mmHg (05/17 0800) FiO2 (%):  [30 %-40 %] 30 % (05/18 0400) Last BM Date: 11/21/12  Intake/Output from previous day: Had loose BM yuesterday 05/17 0701 - 05/18 0700 In: 3270 [I.V.:640; NG/GT:370; IV Piggyback:1000; TPN:1260] Out: 2235 [Urine:2235] Intake/Output this shift:    General appearance: alert, no distress and responsive on vent Resp: clear to auscultation bilaterally GI: normal findings: soft, non-tender Incision/Wound: VAC in place, clean and dry  Lab Results:   Recent Labs  11/20/12 0415 11/22/12 0400  WBC 22.0* 13.8*  HGB 9.8* 8.4*  HCT 31.8* 27.3*  PLT 100* 103*   BMET  Recent Labs  11/21/12 0440 11/22/12 0400  NA 140 138  K 3.2* 3.1*  CL 105 104  CO2 32 32  GLUCOSE 144* 164*  BUN 27* 26*  CREATININE 0.89 0.87  CALCIUM 8.0* 7.9*     Studies/Results: Portable Chest Xray In Am  11/21/2012   *RADIOLOGY REPORT*  Clinical Data: New.  PORTABLE CHEST - 1 VIEW  Comparison: 5/16, 5/15, and 11/18/2012  Findings: There is been partial clearing of the infiltrates at both bases.  Heart size and vascularity are normal.  Endotracheal tube and central venous catheter and NG tube are unchanged.  IMPRESSION: Improvement in the bibasilar infiltrates.   Original Report Authenticated By: Francene Boyers, M.D.   Portable Chest Xray  11/20/2012    *RADIOLOGY REPORT*  Clinical Data: Endotracheal tube placement.  PORTABLE CHEST - 1 VIEW  Comparison: 11/20/2012 5:00 a.m. and 11/19/2012.  Findings: Endotracheal tube has been placed with the tip 3.5 cm above the carina.  Improved aeration of the left lung with residual left base atelectasis and / or left-sided pleural effusion. Decrease in degree of midline shift to the left.  Left base infiltrate would be difficult to exclude with the presence of atelectasis.  Left central line tip distal superior vena cava level.  No gross pneumothorax.  Pulmonary vascular congestion/pulmonary edema most notable centrally.  Nasogastric tube courses below the diaphragm.  The tip is not included on this exam.  Heart size difficult to adequately assessed.  Mitral valve calcifications.  Calcification of the aorta.  IMPRESSION: Endotracheal tube has been placed with the tip 3.5 cm above the carina.  Improved aeration of the left lung with residual left base atelectasis and / or left-sided pleural effusion. Decrease in degree of midline shift to the left.  Pulmonary vascular congestion/ pulmonary edema most notable centrally.  This is a call report.   Original Report Authenticated By: Lacy Duverney, M.D.    Anti-infectives: Anti-infectives   Start     Dose/Rate Route Frequency Ordered Stop   11/21/12 1500  imipenem-cilastatin (PRIMAXIN) 500 mg in sodium chloride 0.9 % 100 mL IVPB     500 mg 200 mL/hr over 30 Minutes Intravenous Every 6 hours 11/21/12 1322  11/21/12 1400  vancomycin (VANCOCIN) IVPB 1000 mg/200 mL premix     1,000 mg 200 mL/hr over 60 Minutes Intravenous Every 12 hours 11/21/12 1058     11/20/12 0900  imipenem-cilastatin (PRIMAXIN) 500 mg in sodium chloride 0.9 % 100 mL IVPB  Status:  Discontinued     500 mg 200 mL/hr over 30 Minutes Intravenous Every 8 hours 11/20/12 0856 11/21/12 1322   11/19/12 1000  vancomycin (VANCOCIN) 1,250 mg in sodium chloride 0.9 % 250 mL IVPB  Status:  Discontinued     1,250  mg 166.7 mL/hr over 90 Minutes Intravenous Every 12 hours 11/19/12 0920 11/21/12 1058   11/13/12 1500  ertapenem (INVANZ) 1 g in sodium chloride 0.9 % 50 mL IVPB  Status:  Discontinued     1 g 100 mL/hr over 30 Minutes Intravenous Every 24 hours 11/12/12 1758 11/20/12 0852   11/13/12 1000  Levofloxacin (LEVAQUIN) IVPB 250 mg  Status:  Discontinued     250 mg 50 mL/hr over 60 Minutes Intravenous Every 24 hours 11/12/12 0953 11/12/12 1259   11/12/12 2200  ciprofloxacin (CIPRO) IVPB 400 mg  Status:  Discontinued     400 mg 200 mL/hr over 60 Minutes Intravenous Every 12 hours 11/12/12 1308 11/12/12 1747   11/12/12 1830  aztreonam (AZACTAM) 1 g in dextrose 5 % 50 mL IVPB  Status:  Discontinued     1 g 100 mL/hr over 30 Minutes Intravenous Every 8 hours 11/12/12 1811 11/13/12 0931   11/12/12 1446  ertapenem (INVANZ) 1 g in sodium chloride 0.9 % 50 mL IVPB  Status:  Discontinued    Comments:  stat   1 g 100 mL/hr over 30 Minutes Intravenous 60 min pre-op 11/12/12 1444 11/12/12 1700   11/12/12 1400  metroNIDAZOLE (FLAGYL) IVPB 500 mg  Status:  Discontinued     500 mg 100 mL/hr over 60 Minutes Intravenous Every 8 hours 11/12/12 1308 11/13/12 0911   11/12/12 1000  levofloxacin (LEVAQUIN) IVPB 500 mg     500 mg 100 mL/hr over 60 Minutes Intravenous  Once 11/12/12 0942 11/12/12 1157      Assessment/Plan: s/p Procedure(s): Washout Abdominal Wound, debridement of Fascia, Partial Closure Abdomen benign and recent negative CT.  Tolerating TF, will advance to 20cc/hr VDRF, re intubated due to atelectasis, now weaning   LOS: 11 days    Cleda Imel T 11/22/2012

## 2012-11-23 ENCOUNTER — Inpatient Hospital Stay (HOSPITAL_COMMUNITY): Payer: BC Managed Care – PPO

## 2012-11-23 LAB — CBC
HCT: 27.3 % — ABNORMAL LOW (ref 36.0–46.0)
MCV: 94.8 fL (ref 78.0–100.0)
RBC: 2.88 MIL/uL — ABNORMAL LOW (ref 3.87–5.11)
WBC: 14.4 10*3/uL — ABNORMAL HIGH (ref 4.0–10.5)

## 2012-11-23 LAB — DIFFERENTIAL
Eosinophils Relative: 1 % (ref 0–5)
Lymphocytes Relative: 4 % — ABNORMAL LOW (ref 12–46)
Lymphs Abs: 0.6 10*3/uL — ABNORMAL LOW (ref 0.7–4.0)
Monocytes Absolute: 1.2 10*3/uL — ABNORMAL HIGH (ref 0.1–1.0)
Neutro Abs: 12.4 10*3/uL — ABNORMAL HIGH (ref 1.7–7.7)

## 2012-11-23 LAB — COMPREHENSIVE METABOLIC PANEL
ALT: 36 U/L — ABNORMAL HIGH (ref 0–35)
AST: 104 U/L — ABNORMAL HIGH (ref 0–37)
Albumin: 1.3 g/dL — ABNORMAL LOW (ref 3.5–5.2)
Calcium: 7.9 mg/dL — ABNORMAL LOW (ref 8.4–10.5)
Sodium: 139 mEq/L (ref 135–145)
Total Protein: 5.9 g/dL — ABNORMAL LOW (ref 6.0–8.3)

## 2012-11-23 LAB — PREALBUMIN: Prealbumin: 4.4 mg/dL — ABNORMAL LOW (ref 17.0–34.0)

## 2012-11-23 LAB — GLUCOSE, CAPILLARY: Glucose-Capillary: 155 mg/dL — ABNORMAL HIGH (ref 70–99)

## 2012-11-23 LAB — AMMONIA: Ammonia: 32 umol/L (ref 11–60)

## 2012-11-23 LAB — CHOLESTEROL, TOTAL: Cholesterol: 49 mg/dL (ref 0–200)

## 2012-11-23 MED ORDER — FUROSEMIDE 10 MG/ML IJ SOLN
40.0000 mg | Freq: Two times a day (BID) | INTRAMUSCULAR | Status: AC
  Administered 2012-11-23 (×2): 40 mg via INTRAVENOUS
  Filled 2012-11-23 (×2): qty 4

## 2012-11-23 MED ORDER — QUETIAPINE FUMARATE 25 MG PO TABS
25.0000 mg | ORAL_TABLET | Freq: Every day | ORAL | Status: DC
  Administered 2012-11-23 – 2012-12-02 (×10): 25 mg via ORAL
  Filled 2012-11-23 (×12): qty 1

## 2012-11-23 MED ORDER — MAGNESIUM SULFATE 40 MG/ML IJ SOLN
2.0000 g | Freq: Once | INTRAMUSCULAR | Status: AC
  Administered 2012-11-23: 2 g via INTRAVENOUS
  Filled 2012-11-23: qty 50

## 2012-11-23 MED ORDER — SODIUM CHLORIDE 0.9 % IJ SOLN
10.0000 mL | Freq: Two times a day (BID) | INTRAMUSCULAR | Status: DC
  Administered 2012-11-23 – 2012-12-02 (×9): 10 mL

## 2012-11-23 MED ORDER — VITAMIN B-1 100 MG PO TABS
100.0000 mg | ORAL_TABLET | Freq: Every day | ORAL | Status: DC
  Administered 2012-11-24 – 2012-12-03 (×11): 100 mg via ORAL
  Filled 2012-11-23 (×10): qty 1

## 2012-11-23 MED ORDER — POTASSIUM CHLORIDE 20 MEQ/15ML (10%) PO LIQD
20.0000 meq | ORAL | Status: AC
  Administered 2012-11-23 (×2): 20 meq
  Filled 2012-11-23 (×2): qty 15

## 2012-11-23 MED ORDER — FOLIC ACID 1 MG PO TABS
1.0000 mg | ORAL_TABLET | Freq: Every day | ORAL | Status: DC
  Administered 2012-11-24 – 2012-12-03 (×10): 1 mg via ORAL
  Filled 2012-11-23 (×10): qty 1

## 2012-11-23 MED ORDER — PANTOPRAZOLE SODIUM 40 MG PO PACK
40.0000 mg | PACK | Freq: Two times a day (BID) | ORAL | Status: DC
  Administered 2012-11-23 – 2012-11-24 (×3): 40 mg
  Filled 2012-11-23 (×8): qty 20

## 2012-11-23 MED ORDER — FAT EMULSION 20 % IV EMUL
250.0000 mL | INTRAVENOUS | Status: AC
  Administered 2012-11-23: 250 mL via INTRAVENOUS
  Filled 2012-11-23: qty 250

## 2012-11-23 MED ORDER — SODIUM CHLORIDE 0.9 % IJ SOLN
10.0000 mL | INTRAMUSCULAR | Status: DC | PRN
  Administered 2012-11-29 – 2012-12-02 (×5): 10 mL

## 2012-11-23 MED ORDER — TRACE MINERALS CR-CU-F-FE-I-MN-MO-SE-ZN IV SOLN
INTRAVENOUS | Status: AC
  Administered 2012-11-23: 18:00:00 via INTRAVENOUS
  Filled 2012-11-23: qty 1000

## 2012-11-23 NOTE — Evaluation (Signed)
Clinical/Bedside Swallow Evaluation Patient Details  Name: [name] [name] MRN: [id number] Date of Birth: 1945/11/08  Today's Date: 11/23/2012 Time: 7829-5621 SLP Time Calculation (min): 32 min  Past Medical History:  Past Medical History  Diagnosis Date  . Hypertension   . H/O brain surgery   . Aneurysm of anterior cerebral artery   . Hernia   . Anxiety   . Stroke   . Anemia   . Arthritis    Past Surgical History:  Past Surgical History  Procedure Laterality Date  . Laparotomy  01/22/2012    Procedure: EXPLORATORY LAPAROTOMY;  Surgeon: Emelia Loron, MD;  Location: [facility] OR;  Service: General;  Laterality: N/A;  lysis of adhesions  . Hernia repair    . Esophagogastroduodenoscopy N/A 09/19/2012    Procedure: ESOPHAGOGASTRODUODENOSCOPY (EGD);  Surgeon: Charna An, MD;  Location: [facility] ENDOSCOPY;  Service: Endoscopy;  Laterality: N/A;  . Colonoscopy N/A 09/20/2012    Procedure: COLONOSCOPY;  Surgeon: Charna Riti, MD;  Location: [facility] ENDOSCOPY;  Service: Endoscopy;  Laterality: N/A;  . Givens capsule study N/A 09/21/2012    Procedure: GIVENS CAPSULE STUDY;  Surgeon: Charna Doll, MD;  Location: [facility] ENDOSCOPY;  Service: Endoscopy;  Laterality: N/A;  . Enteroscopy N/A 09/28/2012    Procedure: ENTEROSCOPY;  Surgeon: Rachael Fee, MD;  Location: [facility] ENDOSCOPY;  Service: Endoscopy;  Laterality: N/A;  . Upper gastrointestinal endoscopy  10/05/12  . Enteroscopy N/A 10/05/2012    Procedure: ENTEROSCOPY;  Surgeon: Meryl Dare, MD;  Location: [facility] ENDOSCOPY;  Service: Endoscopy;  Laterality: N/A;  . Laparotomy N/A 11/12/2012    Procedure: EXPLORATORY LAPAROTOMY  with repair perforated small bowel, abdominal wall debridement, application of wound vac dressing;  Surgeon: Ernestene Mention, MD;  Location: [facility] ORS;  Service: General;  Laterality: N/A;  . Wound debridement  11/14/2012    Procedure: Washout Abdominal Wound, debridement of Fascia, Partial Closure;  Surgeon: Ardeth Sportsman, MD;  Location:  [facility] ORS;  Service: General;;   HPI:  67    Assessment / Plan / Recommendation Clinical Impression  Pt without focal CN deficits- however she is hoarse and produced weak breathy verbalizations.   Pt able to verbalize only 1-2 breathy words per breath group.  Provided pt with a few small ice chips, tsp of water and single straw sip.  Delayed swallow noted with decreased laryngeal elevation palpated at bedside and subtle signs of pharyngeal deficits.  Pt reported sensation of stasis with direct question cue.  Question if pt has pharyngeal edema impacting swallowing due to her intubations x2.  Rec continue npo except ice chips for now and provide nutrition via tube feeding.  Pt's NG in place may negatively impact epiglottic deflection as well.   Pt also states she had transient dysphagia after her CVA in 2008.  Speech is nearly unintelligible, ? cognition vs medications. SLP to follow at bedside for readiness for dietary advancement.  Per dietician, spouse had previously reported pt ate well and denied dysphagia prior to admission.  Skilled intervention included educating pt to goals, plan and aspiration precautions.      Aspiration Risk  Severe    Diet Recommendation NPO (except single bolues of ice after oral care)   Medication Administration: Via alternative means Supervision: Staff feed patient Postural Changes and/or Swallow Maneuvers: Seated upright 90 degrees    Other  Recommendations Oral Care Recommendations: Oral care QID   Follow Up Recommendations  Other (comment) (TBD)    Frequency and Duration min 2x/week  1  week   Pertinent Vitals/Pain Afebrile, decreased    SLP Swallow Goals Goal #3: Pt will demonstration ability to phonate 4 words per breath group with adequate control to demonstrate improved airway protection.   Goal #4: Pt will swallow tsps of water and ice chips without sensation of stasis to aid in determining readiness for dietary advancement.    Swallow Study Prior  Functional Status   per SLP discussion with RD, spouse had reported pt at well without difficulties prior to admission    General Date of Onset: 11/16/12 HPI: 67  Type of Study: Bedside swallow evaluation Previous Swallow Assessment: 67 yo female adm to [facility] with perforation of jejunum s/p repair 11/12/12.  Pt  PMH for alcohol hepatitis, ileus, ARF, alcohol abuse - ETOH w/d, anemia, CVA 08, dyspnea, cough-wheezing, ARF, asp pna, divericulosis, HTN.  WBC 13.8 and 14.r.  CXR 5/18 showed no changes, bilateral ATX vs infection - left more than right.  Pt is s/p bronch 5/16, required intubation 5/8-5/15, 5/16-5/18.  She has TFs for nutrition via NG tube.  Order received for swallow evaluation.  Diet Prior to this Study: NPO;Other (Comment) (NG tube) Respiratory Status: Supplemental O2 delivered via (comment) History of Recent Intubation: No Behavior/Cognition: Alert;Distractible;Decreased sustained attention;Requires cueing;Other (comment) (delayed responses) Oral Cavity - Dentition: Adequate natural dentition Patient Positioning: Upright in bed Baseline Vocal Quality: Hoarse;Low vocal intensity;Breathy Volitional Cough: Cognitively unable to elicit (pt declined to conduct secondary to pain) Volitional Swallow: Unable to elicit    Oral/Motor/Sensory Function Overall Oral Motor/Sensory Function:  (no focal CN deficits- mild generalized weakness) Labial ROM: Within Functional Limits Labial Symmetry: Within Functional Limits Labial Strength: Within Functional Limits Labial Sensation: Within Functional Limits Lingual ROM: Within Functional Limits Lingual Symmetry: Within Functional Limits Lingual Strength: Within Functional Limits Lingual Sensation: Within Functional Limits Facial ROM: Within Functional Limits Facial Symmetry: Within Functional Limits Facial Strength: Within Functional Limits Facial Sensation: Within Functional Limits Velum: Within Functional Limits Mandible: Within Functional  Limits   Ice Chips Ice chips: Impaired Presentation: Spoon Oral Phase Impairments: Impaired anterior to posterior transit Oral Phase Functional Implications: Prolonged oral transit Pharyngeal Phase Impairments: Suspected delayed Swallow;Decreased hyoid-laryngeal movement Other Comments: pt reports sensation of pharyngeal stasis   Thin Liquid Thin Liquid: Impaired Presentation: Spoon;Straw Oral Phase Impairments: Impaired anterior to posterior transit Pharyngeal  Phase Impairments: Suspected delayed Swallow;Throat Clearing - Immediate;Multiple swallows;Decreased hyoid-laryngeal movement Other Comments: pt reported sensation of stasis, dry swallows aided clearance sensation    Nectar Thick Nectar Thick Liquid: Not tested   Honey Thick Honey Thick Liquid: Not tested   Puree Puree: Not tested Other Comments: pt declined to swallow jello   Solid   GO    Solid: Not tested       Donavan Burnet, MS [facility] SLP [phone]

## 2012-11-23 NOTE — Progress Notes (Addendum)
ANTIBIOTIC CONSULT NOTE - Follow up   Pharmacy Consult for Vancomycin/Primaxin  Indication: PNA  Allergies  Allergen Reactions  . Etomidate     Myoclonus side effect pronounced  . Cephalexin Rash  . Penicillins Rash    Patient Measurements: Height: 5\' 5"  (165.1 cm) Weight: 214 lb 8.1 oz (97.3 kg) IBW/kg (Calculated) : 57   Vital Signs: Temp: 98.5 F (36.9 C) (05/19 0400) Temp src: Oral (05/19 0400) BP: 91/42 mmHg (05/19 0706) Pulse Rate: 75 (05/19 0700) Intake/Output from previous day: 05/18 0701 - 05/19 0700 In: 3490 [I.V.:430; NG/GT:630; IV Piggyback:950; TPN:1200] Out: 2250 [Urine:2250] Intake/Output from this shift:    Labs:  Recent Labs  11/21/12 0440 11/22/12 0400 11/23/12 0430  WBC  --  13.8* 14.4*  HGB  --  8.4* 8.5*  PLT  --  103* 116*  CREATININE 0.89 0.87 0.80   Estimated Creatinine Clearance: 82 ml/min (by C-G formula based on Cr of 0.8).  Recent Labs  11/21/12 0913  VANCOTROUGH 24.6*     Microbiology: Recent Results (from the past 720 hour(s))  URINE CULTURE     Status: None   Collection Time    11/11/12  9:55 PM      Result Value Range Status   Specimen Description URINE, CATHETERIZED   Final   Special Requests NONE   Final   Culture  Setup Time 11/12/2012 06:03   Final   Colony Count NO GROWTH   Final   Culture NO GROWTH   Final   Report Status 11/13/2012 FINAL   Final  MRSA PCR SCREENING     Status: None   Collection Time    11/12/12  1:28 PM      Result Value Range Status   MRSA by PCR NEGATIVE  NEGATIVE Final   Comment:            The GeneXpert MRSA Assay (FDA     approved for NASAL specimens     only), is one component of a     comprehensive MRSA colonization     surveillance program. It is not     intended to diagnose MRSA     infection nor to guide or     monitor treatment for     MRSA infections.  CULTURE, BLOOD (ROUTINE X 2)     Status: None   Collection Time    11/19/12  9:30 AM      Result Value Range Status   Specimen Description BLOOD RIGHT ARM   Final   Special Requests BOTTLES DRAWN AEROBIC AND ANAEROBIC 8CC   Final   Culture  Setup Time 11/19/2012 12:44   Final   Culture     Final   Value:        BLOOD CULTURE RECEIVED NO GROWTH TO DATE CULTURE WILL BE HELD FOR 5 DAYS BEFORE ISSUING A FINAL NEGATIVE REPORT   Report Status PENDING   Incomplete  URINE CULTURE     Status: None   Collection Time    11/19/12  9:35 AM      Result Value Range Status   Specimen Description URINE, CATHETERIZED   Final   Special Requests Normal   Final   Culture  Setup Time 11/19/2012 13:48   Final   Colony Count NO GROWTH   Final   Culture NO GROWTH   Final   Report Status 11/20/2012 FINAL   Final  CULTURE, BLOOD (ROUTINE X 2)     Status: None   Collection  Time    11/19/12  9:40 AM      Result Value Range Status   Specimen Description BLOOD RIGHT HAND   Final   Special Requests BOTTLES DRAWN AEROBIC ONLY 2CC   Final   Culture  Setup Time 11/19/2012 12:44   Final   Culture     Final   Value:        BLOOD CULTURE RECEIVED NO GROWTH TO DATE CULTURE WILL BE HELD FOR 5 DAYS BEFORE ISSUING A FINAL NEGATIVE REPORT   Report Status PENDING   Incomplete  CULTURE, RESPIRATORY (NON-EXPECTORATED)     Status: None   Collection Time    11/19/12 10:50 AM      Result Value Range Status   Specimen Description TRACHEAL ASPIRATE   Final   Special Requests NONE   Final   Gram Stain     Final   Value: FEW WBC PRESENT,BOTH PMN AND MONONUCLEAR     NO SQUAMOUS EPITHELIAL CELLS SEEN     NO ORGANISMS SEEN   Culture Non-Pathogenic Oropharyngeal-type Flora Isolated.   Final   Report Status 11/21/2012 FINAL   Final  CULTURE, RESPIRATORY (NON-EXPECTORATED)     Status: None   Collection Time    11/20/12 10:56 AM      Result Value Range Status   Specimen Description TRACHEAL ASPIRATE   Final   Special Requests Normal   Final   Gram Stain     Final   Value: FEW WBC PRESENT,BOTH PMN AND MONONUCLEAR     NO SQUAMOUS EPITHELIAL CELLS  SEEN     NO ORGANISMS SEEN   Culture Non-Pathogenic Oropharyngeal-type Flora Isolated.   Final   Report Status 11/22/2012 FINAL   Final    Medical History: Past Medical History  Diagnosis Date  . Hypertension   . H/O brain surgery   . Aneurysm of anterior cerebral artery   . Hernia   . Anxiety   . Stroke   . Anemia   . Arthritis     Medications:  Scheduled:  . albuterol  2.5 mg Nebulization Q6H  . antiseptic oral rinse  15 mL Mouth Rinse QID  . chlorhexidine  15 mL Mouth Rinse BID  . feeding supplement (JEVITY 1.2 CAL)  1,000 mL Per Tube Q24H  . folic acid  1 mg Intravenous Daily  . imipenem-cilastatin  500 mg Intravenous Q6H  . insulin aspart  0-20 Units Subcutaneous Q4H  . labetalol  100 mg Oral TID  . lip balm  1 application Topical BID  . pantoprazole (PROTONIX) IV  40 mg Intravenous Q12H  . protein supplement  12 g Oral QID  . rocuronium  50 mg Intravenous Once  . thiamine  100 mg Intravenous Daily  . vancomycin  1,000 mg Intravenous Q12H   Infusions:  . sodium chloride 20 mL/hr at 11/21/12 0133  . norepinephrine (LEVOPHED) Adult infusion Stopped (11/20/12 1415)  . Marland KitchenTPN (CLINIMIX-E) Adult 50 mL/hr at 11/22/12 1710   PRN: bisacodyl, fentaNYL, hydrALAZINE, ondansetron (ZOFRAN) IV Assessment: HPI: 63 Female with perforated SB, peritonitis underwent exploratory lap with lysis of adhesions, repair of perforation. Prior hx of cirrhosis from ETOH abuse, coagulopathy, admit INR 3.23. Vit K ordered.  5/8 >> Levaquin >> 5/9 5/8 >> Metronidazole >> 5/9 5/8 >> Aztreonam >> 5/9 (Changed to ertapenem by CCS) 5/9 >> Ertapenem >> 5/16 (discontinued as no pseudomonas coverage) 5/15 >> Vanc >> 5/16 >> Primaxin >>   Tmax: AFeb WBCs: 14.4 (trending up) Renal: SCr =0.8, CrCl ~  82 CG, ~ 81 N  5/8 urine: NGF 5/8 MRSA PCR (-) 5/15 Urine: NG F 5/15 Sputum: Normal flora 5/15 Blood: NGTD 5/16: Trach Aspirate - Nl flora *No abdominal cultures    Goal of Therapy:  Vancomycin  trough level 15-20 mcg/ml Primaxin per renal function/weight   Plan:  Dady #4 imipenem, D#5 vancomycin  Continue Vancomycin 1 gram IV q12h, adjusted for elevated trough 5/17  Continue imipenem to 500mg  IV q6h for CrCl  Follow renal function and recheck vanco trough based on length of therapy  Juliette Alcide, PharmD, BCPS.   Pager: [phone] 7:35 AM 11/23/2012

## 2012-11-23 NOTE — Progress Notes (Addendum)
PARENTERAL NUTRITION CONSULT NOTE   Pharmacy Consult for TPN Indication: Intolerance to enteral feeding, s/p repair of bowel perforation  Allergies  Allergen Reactions  . Etomidate     Myoclonus side effect pronounced  . Cephalexin Rash  . Penicillins Rash    Patient Measurements: Height: 5\' 5"  (165.1 cm) Weight: 214 lb 8.1 oz (97.3 kg) IBW/kg (Calculated) : 57  Vital Signs: Temp: 98.5 F (36.9 C) (05/19 0400) Temp src: Oral (05/19 0400) BP: 84/43 mmHg (05/19 0700) Pulse Rate: 75 (05/19 0700) Intake/Output from previous day: 05/18 0701 - 05/19 0700 In: 3490 [I.V.:430; NG/GT:630; IV Piggyback:950; TPN:1200] Out: 2250 [Urine:2250]  Labs:  Recent Labs  11/20/12 0837 11/21/12 0440 11/22/12 0400 11/23/12 0430  WBC  --   --  13.8* 14.4*  HGB  --   --  8.4* 8.5*  HCT  --   --  27.3* 27.3*  PLT  --   --  103* 116*  INR 2.00* 1.82*  --   --      Recent Labs  11/21/12 0440 11/22/12 0400 11/23/12 0430  NA 140 138 139  K 3.2* 3.1* 3.5  CL 105 104 104  CO2 32 32 30  GLUCOSE 144* 164* 157*  BUN 27* 26* 25*  CREATININE 0.89 0.87 0.80  CALCIUM 8.0* 7.9* 7.9*  MG 2.0  --  1.8  PHOS 3.6  --  3.2  PROT  --  5.8* 5.9*  ALBUMIN  --  1.4* 1.3*  AST  --  77* 104*  ALT  --  33 36*  ALKPHOS  --  117 149*  BILITOT  --  2.9* 3.8*   Estimated Creatinine Clearance: 82 ml/min (by C-G formula based on Cr of 0.8).    Recent Labs  11/22/12 1958 11/23/12 0001 11/23/12 0417  GLUCAP 130* 158* 172*    Nutritional Goals:  RD Recommendation 5/15: Decrease Clinimix 5/15 to 50 ml/hr which will provide an average of 1058 kcal and 60 grams of protein. Provide Beneprotein 2 packet 4 times daily thru feeding tube to provide 200 kcal and 48 grams of protein. TPN @ 50 ml/hr plus Beneprotein will provide 1258 kcal and 108 grams of protein and will meet 106% of estimated calorie needs and 95% of estimated protein needs.  Current nutrition:   Diet: NPO  IVF: NS at  [facility]  Beneprotein per feeding tube   Jevity 1.2 at 56ml/hr  CBGs & Insulin requirements past 24 hours:   CBGs:  139-172  Resistant SSI: 21 units/ 24hrs  Insulin 45 units/2L Bag  Assessment:   67 yo F admit 5/7 with abdominal pain and on 5/8 went to OR for exp laparatomy with repair of perforated small bowel and abdominal wall debridement.  Due to diffuse peritonitis, wound closure was delayed; pt was returned to OR on 5/10 for peritoneal washout, debridement, abdominal wall closure.  Re-intubated 5/16 with worsening pulmonary process, extubated 5/18  Tube feeds were started 5/10, but d/c on 5/12 d/t high residuals ( ). TF resumed 5/17 and patient tolerating at 11ml/hr  Labs:   Electrolytes: K+ improved, Ca=7.9 but corrects to 10.06, other electrolytes WNL  Renal function: Scr has improved to baseline, CrCl 66 ml/min  Hepatic function: AST/ALT, alk phos are elevated/stable today. Hx of alcoholic cirrhosis noted, monitor trend.   Pre-Albumin: 6.9 (5/13), pending (5/19)  TG/Cholesterol: wnl (5/13), 77 (5/19)  Glucose:  Slightly above goal range (< 150 mg/dL) - will adjust insulin in TNA  Plan:   On Jevity  1.2 at 23ml/hr and tolerating (provides 28gm protein and 750 kcal/24h).   If TPN not stopped today, will reduce TPN rate for TF  Per CCS note, to perform swallow study to see if can start PO, if not then will likely increase TF rate, based on current rates, will decrease TPN to 53ml/hr tonight while awaiting enteral nutrition decision Decrease insulin in TPN to 25 units/bag Expect increase in CBGs secondary to increase TF rate 5/18 Fat emulsion at 10 ml/hr (MWF only due to ongoing shortage). TNA to contain standard multivitamins and trace elements (MWF only due to ongoing shortage). Continue IVF at 10-20 ml/hr. Continue resistant SSI q4h. TNA lab panels on Mondays & Thursdays.  Juliette Alcide, PharmD, BCPS.   Pager: [phone] 7:06 AM 11/23/2012

## 2012-11-23 NOTE — Progress Notes (Signed)
CARE MANAGEMENT NOTE 11/23/2012  Patient:  [name] [name], [name] [name]   Account Number:  [id number]  Date Initiated:  11/13/2012  Documentation initiated by:  DAVIS,RHONDA  Subjective/Objective Assessment:   pt with perforated bowel, requiring surgical intervention, unable to support resp on vent post op     Action/Plan:   tbd based on progress   Anticipated DC Date:  11/26/2012   Anticipated DC Plan:  SKILLED NURSING FACILITY  In-house referral  Clinical Social Worker      DC Planning Services  NA      [facility] Choice  NA   Choice offered to / List presented to:  NA   DME arranged  NA      DME agency  NA     HH arranged  NA      HH agency  NA   Status of service:  In process, will continue to follow Medicare Important Message given?  NA - LOS <3 / Initial given by admissions (If response is "NO", the following Medicare IM given date fields will be blank) Date Medicare IM given:   Date Additional Medicare IM given:    Discharge Disposition:    Per UR Regulation:  Reviewed for med. necessity/level of care/duration of stay  If discussed at Long Length of Stay Meetings, dates discussed:   11/17/2012    Comments:  [id number] Earlene Plater, RN, BSN, CCM:  CHART REVIEWED AND UPDATED.  pt was extubated again on [id number] to [place] o2, off iv pressors, started on t.f. remains on tpn with lipids, abd wound remains open. Next chart review due on [id number]. NO DISCHARGE NEEDS PRESENT AT THIS TIME. CASE MANAGEMENT [phone]   [id number]/DGUYQI Davis,Rn,BSN,CCM: Patient was extubated pm of [id number], resp distress reoccuring am of [id number], pt had bronch and washings done and then reintubuted and back on vent per husband request. No trach at this time.  [id number] Earlene Plater, RN, BSN, CCM:  CHART REVIEWED AND UPDATED.  Next chart review due on [id number]. NO DISCHARGE NEEDS PRESENT AT THIS TIME. pt remains on the vent at full support, open abd wound and vac. CASE  MANAGEMENT [phone]   [id number]/JSEGBT Davis,RN,BSN,CCM: Patient returned to or on [id number], post-op resp failure, open abd wound, on vent-intubated. [id number]/SWNIOE Earlene Plater, RN, BSN, CCM:  CHART REVIEWED AND UPDATED.  Next chart review due on [id number]. NO DISCHARGE NEEDS PRESENT AT THIS TIME. CASE MANAGEMENT [phone]

## 2012-11-23 NOTE — Progress Notes (Addendum)
PULMONARY  / CRITICAL CARE MEDICINE  Name: [name] [name] MRN: [id number] DOB: 27-Nov-1945    ADMISSION DATE:  11/11/2012 CONSULTATION DATE:  11/12/2012  REFERRING MD :  CCS PRIMARY SERVICE:  PCCM  CHIEF COMPLAINT:  Post op respiratory failure  BRIEF PATIENT DESCRIPTION: 67 yo with alcoholic liver disease was admitted on 5/7 with abdominal pain and was found on 5/8 to have a small bowel perforation.  She was taken to the OR and returned to the ICU with an open wound and mechanically ventilated.   SIGNIFICANT EVENTS / STUDIES:  5/7  Admitted with abdominal pain, nausea, vomiting 5/7  OR >>> Perforated viscus, peritonitis, lysis of adhesions, small bowel repair 5/16 Reintubated r/t complete left sided collapse, increased WBC 5/18 Extubated, WBC decreased, TF started  LINES / TUBES: OETT 5/8 >>>5/15>>>Reintubated 5/16>>> 5/18 NGT 5/8 >>> Foley 5/8 >>> R rad A-line 5/8 >>>5/12>>>5/16>>>5/18 L IJ CVL 5/8 >>  CULTURES: 5/8 Blood >>>negative 5/7 Urine >>>negative Blood 5/15>>>NGTD Urine 5/15>>>NGTD Sputum 5/15>>> few WBC, PMN and mononuclear Sputum via bronch 5/16>>>  few WBC, PMN and mononuclear  ANTIBIOTICS: Aztreonam 5/8 >>>5/9 Flagyl 5/8 >>>5/9 Levaquin 5/8 x 1 Cipro 5/8 x 1 ertapenem 5/9>>>5/16 Vanc 5/15 >>> Primaxin 5/16 >>>  Interval events: Extubated to 2L [place] but required NT suction, awake, tolerating TF, pending swallow eval.  VITAL SIGNS: Temp:  [97.9 F (36.6 C)-98.5 F (36.9 C)] 98.5 F (36.9 C) (05/19 0400) Pulse Rate:  [68-90] 75 (05/19 0700) Resp:  [18-24] 24 (05/19 0700) BP: (84-126)/(37-65) 91/42 mmHg (05/19 0706) SpO2:  [91 %-99 %] 99 % (05/19 0700)  HEMODYNAMICS: CVP:  [12 mmHg-14 mmHg] 14 mmHg  2 liters [place]  INTAKE / OUTPUT: Intake/Output     05/18 0701 - 05/19 0700 05/19 0701 - 05/20 0700   I.V. (mL/kg) 430 (4.4)    Other 280    NG/GT 630    IV Piggyback 950    TPN 1200    Total Intake(mL/kg) 3490 (35.9)    Urine (mL/kg/hr) 2250 (1)     Total Output 2250     Net +1240          Stool Occurrence 1 x     PHYSICAL EXAMINATION: General: Elderly female, lying in bed HEENT: NCAT, EOM intact, neck supple PULM: fine rhonchi bilaterally, no wheezes CV: RRR, no mgr AB: Central wound vac in place, hypoactive bowel sounds. ABD tenderness  Ext: Thin skin with ecchymosis, anasarca improving, SCD's in place. Neuro: rass 0 , follows simple commands, moves all ext.  LABS:  Recent Labs Lab 11/18/12 0455 11/19/12 0421 11/20/12 0505 11/20/12 0959  PHART 7.516* 7.447 7.497* 7.466*  PCO2ART 39.1 47.0* 42.9 46.8*  PO2ART 86.5 116.0* 68.3* 250.0*  HCO3 31.3* 31.7* 32.9* 33.3*  TCO2 28.3 28.8 29.9 30.5  O2SAT 96.9 98.2 93.9 99.4    Recent Labs Lab 11/21/12 0440 11/22/12 0400 11/23/12 0430  NA 140 138 139  K 3.2* 3.1* 3.5  CL 105 104 104  CO2 32 32 30  BUN 27* 26* 25*  CREATININE 0.89 0.87 0.80  GLUCOSE 144* 164* 157*    Recent Labs Lab 11/20/12 0415 11/22/12 0400 11/23/12 0430  HGB 9.8* 8.4* 8.5*  HCT 31.8* 27.3* 27.3*  WBC 22.0* 13.8* 14.4*  PLT 100* 103* 116*   Lab Results  Component Value Date   INR 1.82* 11/21/2012   INR 2.00* 11/20/2012   INR 2.09* 11/18/2012    Recent Labs Lab 11/22/12 1553 11/22/12 1555 11/22/12 1958 11/23/12 0001  11/23/12 0417  GLUCAP 139* 145* 130* 158* 172*   CXR: 5/18- bilateral atelectasis, edema vs infection?, but L > R  ASSESSMENT / PLAN:  PULMONARY A:  Acute post operative respiratory failure.  Risk for TRALI (multiple transfusions).  - S/p extubation on 5/18, PCXR as above. Still has poor cough mechanics and required NT suction overnight.  Diuresis restarted and increased, but still over 1L positive yesterday. P:   -Aggressive pulmonary hygiene -Chest PT QID >> may be able to d/c soon -PT consult to increase mobility -OOB to chair if able -Follow PCXR 5/20 -Continue diuresis  CARDIOVASCULAR A: Sepsis / septic shock secondary to peritonitis.-->resolved - off  pressors, last echo in 2007 showed EF of 55-65% with some mitral valve calcification.  P:  - Insert PICC, D/C CVC  RENAL A:  AKI - resolved Hypokalemia, hypomagnesemia P:   - Repeat diuresis - Replace mag and K - F/u BMP - Follow I&O   GASTROINTESTINAL A:  Alcoholic cirrhosis and Acute alcoholic hepatitis.   Transaminitis - r/t above Perforated viscus in jejunem s/p repair 5/8.  Protein malnutrition CT of abdomen and pelvis 5/15>>>no free air or leak. Cirrhosis, stable hepatic lobe mass (from 2013 imaging), moderate ascites present.   P:   - CCS managing. - Swallow eval - Surgery allowing TF, hopefully we can stop TPN soon - Protonix for GI Px. - Wound vac in place per surgery - See ID section.  HEMATOLOGIC A:  Coagulopathy - presume r/t liver disease. Significant intraoperative bleeding controlled with platelets and plasma. Vitamin K given on 5/14       Post-op anemia: hgb holding s/p transfusions P:  - Daily CBC - PT/INR with AM labs  INFECTIOUS A:  Peritonitis.  PCN / Cephalosporin allergy.   PNA vs pulmonary edema - WBC down, but still elevated. CXR shows L>R atelectasis  P:   - Cultures have been negative thus far - D/C vanc - Other antibiotics as per flowsheet above. - Follow fever curve and WBC  ENDOCRINE  A:  Hyperglycemia. P:   - SSI prn  NEUROLOGIC A:  Acute encephalopathy. H/o Alcohol abuse. Post op pain. At risk for alcohol withdrawal. Risk for delirium - given LOS in ICU, also has reversal sleep wake cycle P:   - Repeat Ammonia - Supportive care - OOB, PT - Thiamin / Folate. - PRN fentanyl for pain - Avoid benzo's - Add seroquel  I have personally obtained a history, examined the patient, evaluated laboratory and imaging results, formulated the assessment and plan and placed orders.  Respiratory status slowly improving >> may be able to d/c chest PT soon.  Follow up with speech, and then determine if she can start eating.    Updated  family at bedside.  Coralyn Helling, MD [facility] Pulmonary/Critical Care 11/23/2012, 11:48 AM Pager:  [phone] After 3pm call: [phone]

## 2012-11-23 NOTE — Progress Notes (Signed)
Peripherally Inserted Central Catheter/Midline Placement  The IV Nurse has discussed with the patient and/or persons authorized to consent for the patient, the purpose of this procedure and the potential benefits and risks involved with this procedure.  The benefits include less needle sticks, lab draws from the catheter and patient may be discharged home with the catheter.  Risks include, but not limited to, infection, bleeding, blood clot (thrombus formation), and puncture of an artery; nerve damage and irregular heat beat.  Alternatives to this procedure were also discussed.  PICC/Midline Placement Documentation        [name] [name] 11/23/2012, 3:04 PM

## 2012-11-23 NOTE — Evaluation (Signed)
Physical Therapy Evaluation Patient Details Name: [name] [name] MRN: [id number] DOB: 1945-09-12 Today's Date: 11/23/2012 Time: 4098-1191 PT Time Calculation (min): 23 min  PT Assessment / Plan / Recommendation Clinical Impression  67 yo female s/p exp lap, small bowel perforation repair 11/12/12. Extubated 5/18. On eval, pt required +2 assist for mobility. Unable to attempt standing due to fatigue/lethargy, dizziness while sitting EOB. Recommend SNF for continued rehab.     PT Assessment  Patient needs continued PT services    Follow Up Recommendations  SNF;Supervision/Assistance - 24 hour    Does the patient have the potential to tolerate intense rehabilitation      Barriers to Discharge        Equipment Recommendations  None recommended by PT    Recommendations for Other Services OT consult   Frequency Min 3X/week    Precautions / Restrictions Precautions Precautions: Fall Precaution Comments: abd surgery, vac Restrictions Weight Bearing Restrictions: No   Pertinent Vitals/Pain No c/o pain      Mobility  Bed Mobility Bed Mobility: Rolling Right;Rolling Left;Supine to Sit;Sit to Supine Rolling Right: 1: +2 Total assist Rolling Right: Patient Percentage: 30% Rolling Left: 1: +2 Total assist Rolling Left: Patient Percentage: 30% Supine to Sit: 1: +2 Total assist Supine to Sit: Patient Percentage: 20% Sit to Supine: 1: +2 Total assist Sit to Supine: Patient Percentage: 10% Details for Bed Mobility Assistance: Increased time to complete tasks. Multimodal cues for safety, technique, hand placement. Assist for trunk to upright/supine and bil LEs onto/off bed. Utilized bedpad to assist with scooting, positioning Transfers Transfers: Not assessed Details for Transfer Assistance: Pt c/o dizziness Ambulation/Gait Ambulation/Gait Assistance: Not tested (comment)    Exercises     PT Diagnosis: Difficulty walking;Generalized weakness  PT Problem List: Decreased  strength;Decreased range of motion;Decreased activity tolerance;Decreased balance;Decreased mobility;Obesity;Decreased knowledge of use of DME;Decreased knowledge of precautions PT Treatment Interventions: DME instruction;Gait training;Functional mobility training;Therapeutic activities;Therapeutic exercise;Balance training;Patient/family education   PT Goals Acute Rehab PT Goals PT Goal Formulation: Patient unable to participate in goal setting Time For Goal Achievement: 12/07/12 Potential to Achieve Goals: Good Pt will go Supine/Side to Sit: with mod assist PT Goal: Supine/Side to Sit - Progress: Goal set today Pt will go Sit to Supine/Side: with mod assist PT Goal: Sit to Supine/Side - Progress: Goal set today Pt will go Sit to Stand: with mod assist PT Goal: Sit to Stand - Progress: Goal set today Pt will Transfer Bed to Chair/Chair to Bed: with mod assist PT Transfer Goal: Bed to Chair/Chair to Bed - Progress: Goal set today Pt will Ambulate: 1 - 15 feet;with mod assist;with least restrictive assistive device PT Goal: Ambulate - Progress: Goal set today  Visit Information  Last PT Received On: 11/23/12 Assistance Needed: +2    Subjective Data  Subjective: We can try Patient Stated Goal: none stated   Prior Functioning  Home Living Lives With: Spouse Home Adaptive Equipment: None Prior Function Level of Independence: Independent Communication Communication: Expressive difficulties    Cognition  Cognition Arousal/Alertness: Lethargic Behavior During Therapy: WFL for tasks assessed/performed Overall Cognitive Status: Difficult to assess Difficult to assess due to:  (pt having some trouble speaking)    Extremity/Trunk Assessment Right Lower Extremity Assessment RLE ROM/Strength/Tone: Deficits RLE ROM/Strength/Tone Deficits: Knee ext 3/5, moves ankle well, hip flex 2/5 Left Lower Extremity Assessment LLE ROM/Strength/Tone: Deficits LLE ROM/Strength/Tone Deficits: moves  ankle well, hip flex 2/5   Balance Balance Balance Assessed: Yes Static Sitting Balance Static Sitting -  Balance Support: Bilateral upper extremity supported;Feet unsupported Static Sitting - Level of Assistance: 4: Min assist Static Sitting - Comment/# of Minutes: Assist to stabilize after sitting EOB ~5 minutes. Pt c/o dizziness.   End of Session PT - End of Session Activity Tolerance: Patient limited by fatigue Patient left: in bed;with call bell/phone within reach  GP     Rebeca Alert, MPT Pager: [phone]

## 2012-11-23 NOTE — Progress Notes (Signed)
9 Days Post-Op   Assessment: s/p Procedure(s): Washout Abdominal Wound, debridement of Fascia, Partial Closure Patient Active Problem List   Diagnosis Date Noted  . Acute respiratory failure 11/13/2012  . Septic shock(785.52) 11/13/2012  . Aspiration pneumonia 11/12/2012  . Dyspnea 11/12/2012  . Cough 11/12/2012  . Wheezing 11/12/2012  . Hypoglycemia 11/12/2012  . Transaminitis 11/12/2012  . Alcoholic hepatitis 11/12/2012  . Normocytic anemia 11/12/2012  . Ileus 11/12/2012  . Perforation of jejunum s/p primary repair 11/12/2012 11/12/2012  . Nausea & vomiting 11/11/2012  . Abdominal pain, generalized 11/11/2012  . Obesity 11/11/2012  . ARF (acute renal failure) 11/11/2012  . 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 Coagulopathy 11/11/2012  . Hyperlipidemia 11/11/2012  . Anxiety 11/11/2012  . Alcohol withdrawal 11/11/2012  . Increased bilirubin level 11/11/2012  . Gastrointestinal hemorrhage 09/25/2012  . Anemia 09/25/2012  . Alcohol abuse 09/18/2012  . Hyponatremia 09/18/2012  . Thrombocytopenia 09/18/2012  . Elevated INR 09/18/2012  . Anemia due to blood loss, acute 01/24/2012  . HYPERGLYCEMIA 10/14/2008  . DIVERTICULOSIS OF COLON 07/20/2007  . HYPERTENSION 01/30/2007  . 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 CEREBROVASCULAR ACCIDENT, HX OF 01/30/2007    Stable surgically, now extubated  Plan: Will check swallow study to see if we can start PO's. She is tolerating TF and can advance if we don't start oral intake. Could then get off TNA  Subjective: Feels OK, no respiratory issues, thirsty and would like something to drink.   Objective: Vital signs in last 24 hours: Temp:  [97.9 F (36.6 C)-98.5 F (36.9 C)] 98.5 F (36.9 C) (05/19 0400) Pulse Rate:  [68-90] 75 (05/19 0700) Resp:  [18-24] 24 (05/19 0700) BP: (84-126)/(37-65) 91/42 mmHg (05/19 0706) SpO2:  [91 %-99 %] 99 % (05/19 0700)   Intake/Output from previous day: 05/18  0701 - 05/19 0700 In: 3490 [I.V.:430; NG/GT:630; IV Piggyback:950; TPN:1200] Out: 2250 [Urine:2250]  General appearance: alert, cooperative and mild distress Resp: clear to auscultation bilaterally GI: Slightly distended, but soft and basically not tender. BS+,   Incision: Wound VAC in place - will check wound when it is changed today  Lab Results:   Recent Labs  11/22/12 0400 11/23/12 0430  WBC 13.8* 14.4*  HGB 8.4* 8.5*  HCT 27.3* 27.3*  PLT 103* 116*   BMET  Recent Labs  11/22/12 0400 11/23/12 0430  NA 138 139  K 3.1* 3.5  CL 104 104  CO2 32 30  GLUCOSE 164* 157*  BUN 26* 25*  CREATININE 0.87 0.80  CALCIUM 7.9* 7.9*    MEDS, Scheduled . albuterol  2.5 mg Nebulization Q6H  . antiseptic oral rinse  15 mL Mouth Rinse QID  . chlorhexidine  15 mL Mouth Rinse BID  . feeding supplement (JEVITY 1.2 CAL)  1,000 mL Per Tube Q24H  . folic acid  1 mg Intravenous Daily  . imipenem-cilastatin  500 mg Intravenous Q6H  . insulin aspart  0-20 Units Subcutaneous Q4H  . labetalol  100 mg Oral TID  . lip balm  1 application Topical BID  . pantoprazole (PROTONIX) IV  40 mg Intravenous Q12H  . protein supplement  12 g Oral QID  . rocuronium  50 mg Intravenous Once  . thiamine  100 mg Intravenous Daily  . vancomycin  1,000 mg Intravenous Q12H    Studies/Results: Dg Chest Port 1 View  11/22/2012   *RADIOLOGY REPORT*  Clinical Data: Evaluate collapse.  History stroke.  Hypertension.  PORTABLE CHEST -  1 VIEW  Comparison: 11/21/2012  Findings: Endotracheal tube appropriately position with tip 3 cm above carina.  Nasogastric tube looped in stomach with tip below the inferior aspect of film.  Left IJ central line terminates at the cavoatrial junction.  Cardiomegaly accentuated by AP portable technique.  Atherosclerosis in the transverse aorta.  Persistent small left pleural effusion. No pneumothorax.  Low lung volumes and mild interstitial edema, similar.  No change in left greater than  right bibasilar airspace disease.  IMPRESSION: No significant change since one day prior.  Bibasilar atelectasis versus infection, greater left than right.  Low lung volumes with mild interstitial edema and persistent small left pleural effusion.  Age advanced aortic atherosclerosis.   Original Report Authenticated By: Jeronimo Greaves, M.D.      LOS: 12 days     Currie Paris, MD, [facility], [place] [phone]   11/23/2012 8:05 AM

## 2012-11-23 NOTE — Progress Notes (Signed)
NUTRITION FOLLOW UP   Pt receiving TPN Clinamix E 5/15 @ 50 ml/hr: Providing 1058 kcal and 60 grams of protein Pt receiving Jevity 1.2 @ 20 ml/hr which provides 576 kcal, 27 grams of protein, and 389 ml of free water. Pt still receiving Resource Beneprotein of 12 grams QID providing 48 grams of protein and 200 kcal.  Current TF regimen (including Beneprotein) and TPN regimen combined provide 1834 kcal and 135 grams of protein which meets 94% of re-estimated energy needs and 115% of re-estimated protein needs.   Intervention:   Recommend discontinuing Resource Beneprotein Recommend increasing TF rate of Jevity 1.2 to 30 ml/hr to provide 864 kcal, 40 grams of protein, and 583 ml of free water. Jevity 1.2 @ 30 ml/hr and Clinamix E 5/15 @ 50 ml/hr will provide 1922 kcal and 100 grams of protein and will meet 98% of re-estimated energy needs and 100% of re-estimated protein needs.  If pt tolerates TF @ 30 ml/hr, recommend decreasing/stopping TPN and increasing TF to goal. Recommendations: Start with Jevity 1.2 @ 30 ml/hr via NG tube and increase by 10 ml every 12 hours to goal rate of 70 ml/hr. 30 ml Prostat once daily.  At goal rate, tube feeding regimen will provide 2116 kcal, 108 grams of protein, and 1360 ml of H2O.    Nutrition Dx:   Inadequate oral intake related to inability to eat as evidenced by NPO status, vent status, and perforated bowel; ongoing- pt off Vent but, remains NPO   Goal:   Enteral nutrition to provide 60-70% of estimated calorie needs (22-25 kcals/kg ideal body weight) and 100% of estimated protein needs, based on ASPEN guidelines for permissive underfeeding in critically ill obese individuals; discontinued  New Goal: Pt to meet >/= 90% of their estimated nutrition needs; being met   Monitor:   Vent Status; pt extubated 5/18  Weight; 24 lb wt loss from 5/13 to 5/16; pt now at reported usual body weight SLP recommendations Labs; low hemoglobin, high BUN (trending  down), low albumin    Assessment:   67 yo with alcoholic liver disease was admitted on 5/7 with abdominal pain and was found on 5/8 to have a small bowel perforation. She was taken to the OR and returned to the ICU with an open wound and mechanically ventilated. Tube feeds were started 5/10, but d/c on 5/12 d/t high residuals ( ). Pt was extubated 5/18 and tube feeds were restarted with 90 ml residual noted on 5/18 AM but, 10 ml residuals noted twice today. Pt asleep at time of visit with TF running at 20 ml/hr and TPN at 50 ml/hr. SLP recommends that pt remain NPO except for ice chips with continuation of parenteral/enteral support.   Height: Ht Readings from Last 1 Encounters:  11/12/12 5\' 5"  (1.651 m)    Weight Status:   Wt Readings from Last 1 Encounters:  11/20/12 214 lb 8.1 oz (97.3 kg)    Re-estimated needs:  Kcal: 1950-2140 Protein: 97-117 grams Fluid: 2.6 L  Skin: +1 generalized edema, +1 RUE and LUE edema, +1 RLE and LLE edema; abdominal incision  Diet Order: NPO   Intake/Output Summary (Last 24 hours) at 11/23/12 1614 Last data filed at 11/23/12 1513  Gross per 24 hour  Intake   2090 ml  Output   2550 ml  Net   -460 ml    Last BM: 5/18   Labs:   Recent Labs Lab 11/20/12 0415 11/21/12 0440 11/22/12 0400 11/23/12 0430  NA  143 140 138 139  K 3.4* 3.2* 3.1* 3.5  CL 104 105 104 104  CO2 34* 32 32 30  BUN 27* 27* 26* 25*  CREATININE 0.99 0.89 0.87 0.80  CALCIUM 8.4 8.0* 7.9* 7.9*  MG 1.9 2.0  --  1.8  PHOS 4.0 3.6  --  3.2  GLUCOSE 150* 144* 164* 157*    CBG (last 3)   Recent Labs  11/23/12 0417 11/23/12 0743 11/23/12 1152  GLUCAP 172* 148* 155*    Scheduled Meds: . albuterol  2.5 mg Nebulization Q6H  . antiseptic oral rinse  15 mL Mouth Rinse QID  . chlorhexidine  15 mL Mouth Rinse BID  . feeding supplement (JEVITY 1.2 CAL)  1,000 mL Per Tube Q24H  . folic acid  1 mg Oral Daily  . furosemide  40 mg Intravenous Q12H  .  imipenem-cilastatin  500 mg Intravenous Q6H  . insulin aspart  0-20 Units Subcutaneous Q4H  . labetalol  100 mg Oral TID  . lip balm  1 application Topical BID  . pantoprazole sodium  40 mg Per Tube Q12H  . protein supplement  12 g Oral QID  . QUEtiapine  25 mg Oral QHS  . sodium chloride  10-40 mL Intracatheter Q12H  . thiamine  100 mg Oral Daily    Continuous Infusions: . sodium chloride 20 mL/hr at 11/21/12 0133  . Marland KitchenTPN (CLINIMIX-E) Adult     And  . fat emulsion    . Marland KitchenTPN (CLINIMIX-E) Adult 50 mL/hr at 11/22/12 1710    Ian Malkin RD, LDN Inpatient Clinical Dietitian Pager: [phone] After Hours Pager: [phone]

## 2012-11-24 ENCOUNTER — Inpatient Hospital Stay (HOSPITAL_COMMUNITY): Payer: BC Managed Care – PPO

## 2012-11-24 DIAGNOSIS — R410 Disorientation, unspecified: Secondary | ICD-10-CM | POA: Diagnosis not present

## 2012-11-24 DIAGNOSIS — D62 Acute posthemorrhagic anemia: Secondary | ICD-10-CM

## 2012-11-24 DIAGNOSIS — E46 Unspecified protein-calorie malnutrition: Secondary | ICD-10-CM

## 2012-11-24 LAB — CBC
Hemoglobin: 8.7 g/dL — ABNORMAL LOW (ref 12.0–15.0)
MCH: 29.2 pg (ref 26.0–34.0)
MCHC: 31 g/dL (ref 30.0–36.0)
Platelets: 151 10*3/uL (ref 150–400)
RDW: 19.4 % — ABNORMAL HIGH (ref 11.5–15.5)

## 2012-11-24 LAB — BLOOD GAS, ARTERIAL
Acid-Base Excess: 6.4 mmol/L — ABNORMAL HIGH (ref 0.0–2.0)
Bicarbonate: 29.7 mEq/L — ABNORMAL HIGH (ref 20.0–24.0)
TCO2: 26.4 mmol/L (ref 0–100)
pCO2 arterial: 38.6 mmHg (ref 35.0–45.0)
pO2, Arterial: 68.5 mmHg — ABNORMAL LOW (ref 80.0–100.0)

## 2012-11-24 LAB — GLUCOSE, CAPILLARY
Glucose-Capillary: 143 mg/dL — ABNORMAL HIGH (ref 70–99)
Glucose-Capillary: 145 mg/dL — ABNORMAL HIGH (ref 70–99)
Glucose-Capillary: 165 mg/dL — ABNORMAL HIGH (ref 70–99)
Glucose-Capillary: 170 mg/dL — ABNORMAL HIGH (ref 70–99)

## 2012-11-24 LAB — BASIC METABOLIC PANEL
Calcium: 7.7 mg/dL — ABNORMAL LOW (ref 8.4–10.5)
GFR calc Af Amer: >90 mL/min (ref >90)
GFR calc non Af Amer: 87 mL/min — ABNORMAL LOW (ref >90)
Glucose, Bld: 162 mg/dL — ABNORMAL HIGH (ref 70–99)
Potassium: 3.5 mEq/L (ref 3.5–5.1)
Sodium: 139 mEq/L (ref 135–145)

## 2012-11-24 LAB — AFP TUMOR MARKER: AFP-Tumor Marker: 6.1 ng/mL (ref 0.0–8.0)

## 2012-11-24 LAB — MAGNESIUM: Magnesium: 2.1 mg/dL (ref 1.5–2.5)

## 2012-11-24 LAB — PROTIME-INR: INR: 1.82 — ABNORMAL HIGH (ref 0.00–1.49)

## 2012-11-24 MED ORDER — PRO-STAT SUGAR FREE PO LIQD
30.0000 mL | Freq: Every day | ORAL | Status: DC
  Administered 2012-11-24 – 2012-11-25 (×2): 30 mL via ORAL
  Filled 2012-11-24 (×3): qty 30

## 2012-11-24 MED ORDER — LEVOTHYROXINE SODIUM 100 MCG IV SOLR
12.5000 ug | Freq: Every day | INTRAVENOUS | Status: DC
  Administered 2012-11-24: 12.5 ug via INTRAVENOUS
  Filled 2012-11-24 (×3): qty 5

## 2012-11-24 MED ORDER — INSULIN REGULAR HUMAN 100 UNIT/ML IJ SOLN
INTRAVENOUS | Status: AC
  Administered 2012-11-24: 17:00:00 via INTRAVENOUS
  Filled 2012-11-24: qty 1000

## 2012-11-24 MED ORDER — POTASSIUM CHLORIDE 20 MEQ/15ML (10%) PO LIQD
20.0000 meq | ORAL | Status: AC
  Administered 2012-11-24 (×2): 20 meq
  Filled 2012-11-24 (×2): qty 15

## 2012-11-24 MED ORDER — JEVITY 1.2 CAL PO LIQD: 1000.0000 mL | ORAL | Status: DC

## 2012-11-24 NOTE — Progress Notes (Signed)
Ashok Norris, NP Nurse Practitioner Signed  Progress Notes Service date: 11/24/2012 9:37 AM  Patient ID: [name] [name], female   DOB: February 17, 1946, 67 y.o.   MRN: [id number]   10 Days Post-Op  Subjective: Pt feeling ok.  +BMs per nursing.  Denies shorn tess of breath.  Denies abdominal pain.  Failed swallow evaluation.     Objective: Vital signs in last 24 hours: Temp:  [97.9 F (36.6 C)-99.5 F (37.5 C)] 99.5 F (37.5 C) (05/20 0800) Pulse Rate:  [72-94] 84 (05/20 0600) Resp:  [18-27] 21 (05/20 0600) BP: (87-114)/(33-67) 98/46 mmHg (05/20 0600) SpO2:  [93 %-99 %] 96 % (05/20 0855) Last BM Date: 11/23/12   Intake/Output from previous day: 05/19 0701 - 05/20 0700 In: 3252.8 [I.V.:600; NG/GT:700; IV Piggyback:400; TPN:1552.8] Out: 2811 [Urine:2660; Drains:150; Stool:1] Intake/Output this shift:   PE: Gen:  Alert and awake.    Card:  RRR, no M/G/R heard.  +2 distal pulses Pulm:  CTA, BUL no W/R/R, diminished BLL.  Tachypnea, no cyanosis Abd: Soft, NT/ND, +BS, no HSM, midline incision-wound vac in place, no erythema surrounding the wound. Ext:  No erythema, edema, or tenderness.   Lab Results:  Recent Labs   11/23/12 0430  11/24/12 0400   WBC  14.4*  15.1*   HGB  8.5*  8.7*   HCT  27.3*  28.1*   PLT  116*  151    BMET Recent Labs   11/23/12 0430  11/24/12 0400   NA  139  139   K  3.5  3.5   CL  104  103   CO2  30  32   GLUCOSE  157*  162*   BUN  25*  24*   CREATININE  0.80  0.76   CALCIUM  7.9*  7.7*    PT/INR Recent Labs   11/24/12 0400   LABPROT  20.4*   INR  1.82*    CMP        Component  Value  Date/Time     NA  139  11/24/2012 0400     K  3.5  11/24/2012 0400     CL  103  11/24/2012 0400     CO2  32  11/24/2012 0400     GLUCOSE  162*  11/24/2012 0400     BUN  24*  11/24/2012 0400     CREATININE  0.76  11/24/2012 0400     CALCIUM  7.7*  11/24/2012 0400     PROT  5.9*  11/23/2012 0430     ALBUMIN  1.3*  11/23/2012 0430     AST  104*   11/23/2012 0430     ALT  36*  11/23/2012 0430     ALKPHOS  149*  11/23/2012 0430     BILITOT  3.8*  11/23/2012 0430     GFRNONAA  87*  11/24/2012 0400     GFRAA  >90  11/24/2012 0400    Lipase      Component  Value  Date/Time     LIPASE  11  11/11/2012 1530     Studies/Results: Dg Chest Port 1 View   11/24/2012   *RADIOLOGY REPORT*  Clinical Data: Follow up atelectasis.  PORTABLE CHEST - 1 VIEW  Comparison: 11/23/2012.  Findings: The NG tube is stable.  The left IJ catheter has been removed. A new right PICC line is in good position with the tip near the cavoatrial junction.  The heart lungs appears stable  with persistent low lung volumes, vascular crowding, atelectasis and effusions.  IMPRESSION:  1.  Removal of left IJ catheter. 2.  New right-sided PICC line with tip near the cavoatrial junction. 3.  Persistent low lung volumes with vascular crowding, atelectasis, vascular congestion and probable small effusions.   Original Report Authenticated By: Rudie Meyer, M.D.    Dg Chest Port 1 View   11/23/2012   *RADIOLOGY REPORT*  Clinical Data: Status post extubation  PORTABLE CHEST - 1 VIEW  Comparison: 11/22/2012  Findings: The cardiac shadow is stable.  The endotracheal tube has been removed.  The nasogastric catheter and left-sided central venous line remain in place. The lungs are well-aerated again demonstrates some mild bibasilar changes.  IMPRESSION: Interval extubation.  The remainder of the exam is stable from prior study.   Original Report Authenticated By: Alcide Clever, M.D.     Anti-infectives: Anti-infectives     Start        Dose/Rate  Route  Frequency  Ordered  Stop     11/21/12 1500    imipenem-cilastatin (PRIMAXIN) 500 mg in sodium chloride 0.9 % 100 mL IVPB        500 mg 200 mL/hr over 30 Minutes  Intravenous  Every 6 hours  11/21/12 1322        11/21/12 1400    vancomycin (VANCOCIN) IVPB 1000 mg/200 mL premix  Status:  Discontinued        1,000 mg 200 mL/hr over 60 Minutes   Intravenous  Every 12 hours  11/21/12 1058  11/23/12 0957     11/20/12 0900    imipenem-cilastatin (PRIMAXIN) 500 mg in sodium chloride 0.9 % 100 mL IVPB  Status:  Discontinued        500 mg 200 mL/hr over 30 Minutes  Intravenous  Every 8 hours  11/20/12 0856  11/21/12 1322     11/19/12 1000    vancomycin (VANCOCIN) 1,250 mg in sodium chloride 0.9 % 250 mL IVPB  Status:  Discontinued        1,250 mg 166.7 mL/hr over 90 Minutes  Intravenous  Every 12 hours  11/19/12 0920  11/21/12 1058     11/13/12 1500    ertapenem (INVANZ) 1 g in sodium chloride 0.9 % 50 mL IVPB  Status:  Discontinued        1 g 100 mL/hr over 30 Minutes  Intravenous  Every 24 hours  11/12/12 1758  11/20/12 0852     11/13/12 1000    Levofloxacin (LEVAQUIN) IVPB 250 mg  Status:  Discontinued       250 mg 50 mL/hr over 60 Minutes  Intravenous  Every 24 hours  11/12/12 0953  11/12/12 1259     11/12/12 2200    ciprofloxacin (CIPRO) IVPB 400 mg  Status:  Discontinued        400 mg 200 mL/hr over 60 Minutes  Intravenous  Every 12 hours  11/12/12 1308  11/12/12 1747     11/12/12 1830    aztreonam (AZACTAM) 1 g in dextrose 5 % 50 mL IVPB  Status:  Discontinued        1 g 100 mL/hr over 30 Minutes  Intravenous  Every 8 hours  11/12/12 1811  11/13/12 0931     11/12/12 1446    ertapenem (INVANZ) 1 g in sodium chloride 0.9 % 50 mL IVPB  Status:  Discontinued     Comments:  stat      1 g 100 mL/hr over  30 Minutes  Intravenous  60 min pre-op  11/12/12 1444  11/12/12 1700     11/12/12 1400    metroNIDAZOLE (FLAGYL) IVPB 500 mg  Status:  Discontinued        500 mg 100 mL/hr over 60 Minutes  Intravenous  Every 8 hours  11/12/12 1308  11/13/12 0911     11/12/12 1000    levofloxacin (LEVAQUIN) IVPB 500 mg        500 mg 100 mL/hr over 60 Minutes  Intravenous   Once  11/12/12 7829  11/12/12 1157           Assessment/Plan S/P Exploratory laparotomy, debridement of abdominal wall tissue 2cm x 8cm, extensive lysis of adhesions,  repair of small bowel perforation(11/12/12 Dr. Derrell Lolling) Washout, debridement of fascia and abdominal wall closure(11/14/12 Dr. Michaell Cowing) -wound vac changed yesterday, fascia is intact, no erythema surrounding the wound -failed swallow evaluation 5/18, CCM repeating with concerns that NGT may be hindering her swallowing.  Repeat swallow evaluation today.  If pt able to start feeds, stop TF and plan for TPN wean by pharmacy.  If Pt fails evaluation, increase TF to goal of 80ml/hr pending residuals -Pulmonary toilet -ambulate with physical therapy as tolerated   Peritonitis -WBCs increased, pt afebrile.    -On IV atbx.   -BC 5/15 negative to date -Monitor CBC.   Post-op Anemia -h&h stable today 8.7/28.1   Calorie Protein Malnutrition -see above   LOS: 13 days    [name] [name] ANP-BC 11/24/2012, 9:37 AM   [phone]

## 2012-11-24 NOTE — Progress Notes (Deleted)
Patient ID: [name] [name], female   DOB: 09/21/1945, 67 y.o.   MRN: [id number]  10 Days Post-Op  Subjective: Pt feeling ok.  +BMs per nursing.  Denies shorn tess of breath.  Denies abdominal pain.  Failed swallow evaluation.    Objective: Vital signs in last 24 hours: Temp:  [97.9 F (36.6 C)-99.5 F (37.5 C)] 99.5 F (37.5 C) (05/20 0800) Pulse Rate:  [72-94] 84 (05/20 0600) Resp:  [18-27] 21 (05/20 0600) BP: (87-114)/(33-67) 98/46 mmHg (05/20 0600) SpO2:  [93 %-99 %] 96 % (05/20 0855) Last BM Date: 11/23/12  Intake/Output from previous day: 05/19 0701 - 05/20 0700 In: 3252.8 [I.V.:600; NG/GT:700; IV Piggyback:400; TPN:1552.8] Out: 2811 [Urine:2660; Drains:150; Stool:1] Intake/Output this shift:    PE: Gen:  Alert and awake.   Card:  RRR, no M/G/R heard.  +2 distal pulses Pulm:  CTA, BUL no W/R/R, diminished BLL.  Tachypnea, no cyanosis Abd: Soft, NT/ND, +BS, no HSM, midline incision-wound vac in place, no erythema surrounding the wound. Ext:  No erythema, edema, or tenderness.  Lab Results:   Recent Labs  11/23/12 0430 11/24/12 0400  WBC 14.4* 15.1*  HGB 8.5* 8.7*  HCT 27.3* 28.1*  PLT 116* 151   BMET  Recent Labs  11/23/12 0430 11/24/12 0400  NA 139 139  K 3.5 3.5  CL 104 103  CO2 30 32  GLUCOSE 157* 162*  BUN 25* 24*  CREATININE 0.80 0.76  CALCIUM 7.9* 7.7*   PT/INR  Recent Labs  11/24/12 0400  LABPROT 20.4*  INR 1.82*   CMP     Component Value Date/Time   NA 139 11/24/2012 0400   K 3.5 11/24/2012 0400   CL 103 11/24/2012 0400   CO2 32 11/24/2012 0400   GLUCOSE 162* 11/24/2012 0400   BUN 24* 11/24/2012 0400   CREATININE 0.76 11/24/2012 0400   CALCIUM 7.7* 11/24/2012 0400   PROT 5.9* 11/23/2012 0430   ALBUMIN 1.3* 11/23/2012 0430   AST 104* 11/23/2012 0430   ALT 36* 11/23/2012 0430   ALKPHOS 149* 11/23/2012 0430   BILITOT 3.8* 11/23/2012 0430   GFRNONAA 87* 11/24/2012 0400   GFRAA >90 11/24/2012 0400   Lipase     Component Value Date/Time    LIPASE 11 11/11/2012 1530       Studies/Results: Dg Chest Port 1 View  11/24/2012   *RADIOLOGY REPORT*  Clinical Data: Follow up atelectasis.  PORTABLE CHEST - 1 VIEW  Comparison: 11/23/2012.  Findings: The NG tube is stable.  The left IJ catheter has been removed. A new right PICC line is in good position with the tip near the cavoatrial junction.  The heart lungs appears stable with persistent low lung volumes, vascular crowding, atelectasis and effusions.  IMPRESSION:  1.  Removal of left IJ catheter. 2.  New right-sided PICC line with tip near the cavoatrial junction. 3.  Persistent low lung volumes with vascular crowding, atelectasis, vascular congestion and probable small effusions.   Original Report Authenticated By: Rudie Meyer, M.D.   Dg Chest Port 1 View  11/23/2012   *RADIOLOGY REPORT*  Clinical Data: Status post extubation  PORTABLE CHEST - 1 VIEW  Comparison: 11/22/2012  Findings: The cardiac shadow is stable.  The endotracheal tube has been removed.  The nasogastric catheter and left-sided central venous line remain in place. The lungs are well-aerated again demonstrates some mild bibasilar changes.  IMPRESSION: Interval extubation.  The remainder of the exam is stable from prior study.   Original Report  Authenticated By: Alcide Clever, M.D.    Anti-infectives: Anti-infectives   Start     Dose/Rate Route Frequency Ordered Stop   11/21/12 1500  imipenem-cilastatin (PRIMAXIN) 500 mg in sodium chloride 0.9 % 100 mL IVPB     500 mg 200 mL/hr over 30 Minutes Intravenous Every 6 hours 11/21/12 1322     11/21/12 1400  vancomycin (VANCOCIN) IVPB 1000 mg/200 mL premix  Status:  Discontinued     1,000 mg 200 mL/hr over 60 Minutes Intravenous Every 12 hours 11/21/12 1058 11/23/12 0957   11/20/12 0900  imipenem-cilastatin (PRIMAXIN) 500 mg in sodium chloride 0.9 % 100 mL IVPB  Status:  Discontinued     500 mg 200 mL/hr over 30 Minutes Intravenous Every 8 hours 11/20/12 0856 11/21/12 1322    11/19/12 1000  vancomycin (VANCOCIN) 1,250 mg in sodium chloride 0.9 % 250 mL IVPB  Status:  Discontinued     1,250 mg 166.7 mL/hr over 90 Minutes Intravenous Every 12 hours 11/19/12 0920 11/21/12 1058   11/13/12 1500  ertapenem (INVANZ) 1 g in sodium chloride 0.9 % 50 mL IVPB  Status:  Discontinued     1 g 100 mL/hr over 30 Minutes Intravenous Every 24 hours 11/12/12 1758 11/20/12 0852   11/13/12 1000  Levofloxacin (LEVAQUIN) IVPB 250 mg  Status:  Discontinued     250 mg 50 mL/hr over 60 Minutes Intravenous Every 24 hours 11/12/12 0953 11/12/12 1259   11/12/12 2200  ciprofloxacin (CIPRO) IVPB 400 mg  Status:  Discontinued     400 mg 200 mL/hr over 60 Minutes Intravenous Every 12 hours 11/12/12 1308 11/12/12 1747   11/12/12 1830  aztreonam (AZACTAM) 1 g in dextrose 5 % 50 mL IVPB  Status:  Discontinued     1 g 100 mL/hr over 30 Minutes Intravenous Every 8 hours 11/12/12 1811 11/13/12 0931   11/12/12 1446  ertapenem (INVANZ) 1 g in sodium chloride 0.9 % 50 mL IVPB  Status:  Discontinued    Comments:  stat   1 g 100 mL/hr over 30 Minutes Intravenous 60 min pre-op 11/12/12 1444 11/12/12 1700   11/12/12 1400  metroNIDAZOLE (FLAGYL) IVPB 500 mg  Status:  Discontinued     500 mg 100 mL/hr over 60 Minutes Intravenous Every 8 hours 11/12/12 1308 11/13/12 0911   11/12/12 1000  levofloxacin (LEVAQUIN) IVPB 500 mg     500 mg 100 mL/hr over 60 Minutes Intravenous  Once 11/12/12 0942 11/12/12 1157       Assessment/Plan S/P Exploratory laparotomy, debridement of abdominal wall tissue 2cm x 8cm, extensive lysis of adhesions, repair of small bowel perforation(11/12/12 Dr. Derrell Lolling) Washout, debridement of fascia and abdominal wall closure(11/14/12 Dr. Michaell Cowing) -wound vac changed yesterday, fascia is intact, no erythema surrounding the wound -failed swallow evaluation 5/18, CCM repeating with concerns that NGT may be hindering her swallowing.  Repeat swallow evaluation today.  If pt able to start feeds,  stop TF and plan for TPN wean by pharmacy.  If Pt fails evaluation, increase TF to goal of 83ml/hr pending residuals -Pulmonary toilet -ambulate with physical therapy as tolerated  Peritonitis -WBCs increased, pt afebrile.   -On IV atbx.  -BC 5/15 negative to date -Monitor CBC.  Post-op Anemia -h&h stable today 8.7/28.1  Calorie Protein Malnutrition -see above   LOS: 13 days    Gerrett Loman ANP-BC 11/24/2012, 9:37 AM  [phone]

## 2012-11-24 NOTE — Evaluation (Signed)
Clinical/Bedside Swallow Evaluation Patient Details  Name: [name] [name] MRN: [id number] Date of Birth: 01-19-1946  Today's Date: 11/24/2012 Time: 4098-1191 SLP Time Calculation (min): 31 min  Past Medical History:  Past Medical History  Diagnosis Date  . Hypertension   . H/O brain surgery   . Aneurysm of anterior cerebral artery   . Hernia   . Anxiety   . Stroke   . Anemia   . Arthritis    Past Surgical History:  Past Surgical History  Procedure Laterality Date  . Laparotomy  01/22/2012    Procedure: EXPLORATORY LAPAROTOMY;  Surgeon: Emelia Loron, MD;  Location: [facility] OR;  Service: General;  Laterality: N/A;  lysis of adhesions  . Hernia repair    . Esophagogastroduodenoscopy N/A 09/19/2012    Procedure: ESOPHAGOGASTRODUODENOSCOPY (EGD);  Surgeon: Charna Kumiko, MD;  Location: [facility] ENDOSCOPY;  Service: Endoscopy;  Laterality: N/A;  . Colonoscopy N/A 09/20/2012    Procedure: COLONOSCOPY;  Surgeon: Charna Analese, MD;  Location: [facility] ENDOSCOPY;  Service: Endoscopy;  Laterality: N/A;  . Givens capsule study N/A 09/21/2012    Procedure: GIVENS CAPSULE STUDY;  Surgeon: Charna Aury, MD;  Location: [facility] ENDOSCOPY;  Service: Endoscopy;  Laterality: N/A;  . Enteroscopy N/A 09/28/2012    Procedure: ENTEROSCOPY;  Surgeon: Rachael Fee, MD;  Location: [facility] ENDOSCOPY;  Service: Endoscopy;  Laterality: N/A;  . Upper gastrointestinal endoscopy  10/05/12  . Enteroscopy N/A 10/05/2012    Procedure: ENTEROSCOPY;  Surgeon: Meryl Dare, MD;  Location: [facility] ENDOSCOPY;  Service: Endoscopy;  Laterality: N/A;  . Laparotomy N/A 11/12/2012    Procedure: EXPLORATORY LAPAROTOMY  with repair perforated small bowel, abdominal wall debridement, application of wound vac dressing;  Surgeon: Ernestene Mention, MD;  Location: [facility] ORS;  Service: General;  Laterality: N/A;  . Wound debridement  11/14/2012    Procedure: Washout Abdominal Wound, debridement of Fascia, Partial Closure;  Surgeon: Ardeth Sportsman, MD;  Location:  [facility] ORS;  Service: General;;   HPI:  67 yo female adm to [facility] with perforation of jejunum s/p repair 11/12/12.  Pt PMH for alcohol hepatitis, ileus, ARF, alcohol abuse - ETOH w/d, anemia, CVA 08, dyspnea, cough-wheezing, ARF, asp pna, divericulosis, HTN.  WBC 13.8 and 14.r.  CXR 5/18 showed no changes, bilateral ATX vs infection - left more than right.  Pt is s/p bronch 5/16, required intubation 5/8-5/15, 5/16-5/18.  Order received for swallow evaluation, completed on 5/19 with recommendations for NPO except ice chips.  Order received for reevaluation today - after NG removal.  Per RN, pt has not had been taking ice chips today - she has been declining intake.      Assessment / Plan / Recommendation Clinical Impression  Pt with improved swallow ability although voice remains hoarse- nearly aphonic- demonstrating decreased airway protection. Current cognition large factor impacting pt's swallow ability.  Pt again able to verbalize 1-2 breathy words per breath group with marginal improvement. No overt s/s of aspiration today - although pt's swallow remains delayed and she had mildly increased work of breathing.  Swallow discoordination most notably with thin liquid:  ? subtle throat clear.  Rec to initiate modified diet with STRICT precautions.  SLP to follow at bedside for tolerance, dietary advancement, etc.  Skilled intervention included educating pt to aspiration precautions and compensatory strategies.     Aspiration Risk  Moderate    Diet Recommendation Dysphagia 1 (Puree);Nectar-thick liquid (ice chips)   Liquid Administration via: Cup Medication Administration: Whole meds with puree (follow  with liquids) Supervision: Full supervision/cueing for compensatory strategies Compensations: Slow rate;Small sips/bites;Check for pocketing Postural Changes and/or Swallow Maneuvers: Seated upright 90 degrees;Upright 30-60 min after meal    Other  Recommendations Other Recommendations: Order thickener from  pharmacy   Follow Up Recommendations   (TBD)    Frequency and Duration min 2x/week  2 weeks   Pertinent Vitals/Pain Afebrile, decreased    SLP Swallow Goals Patient will utilize recommended strategies during swallow to increase swallowing safety with: Total assistance Swallow Study Goal #3 - Progress: Progressing toward goal Swallow Study Goal #4 - Progress: Met   Swallow Study Prior Functional Status   unknown    General Date of Onset: 11/16/12 HPI: 67 yo female adm to [facility] with perforation of jejunum s/p repair 11/12/12.  Pt PMH for alcohol hepatitis, ileus, ARF, alcohol abuse - ETOH w/d, anemia, CVA 08, dyspnea, cough-wheezing, ARF, asp pna, divericulosis, HTN.  WBC 13.8 and 14.r.  CXR 5/18 showed no changes, bilateral ATX vs infection - left more than right.  Pt is s/p bronch 5/16, required intubation 5/8-5/15, 5/16-5/18.  Order received for swallow evaluation, completed on 5/19 with recommendations for NPO except ice chips.  Order received for reevaluation today - after NG removal.  Per RN, pt has not had been taking ice chips today - she has been declining intake.    Type of Study: Bedside swallow evaluation Previous Swallow Assessment: Bedside evaluation 11/23/2012 rec NPO except ice chips Diet Prior to this Study: NPO (had NG, removed this am\) Temperature Spikes Noted: No Respiratory Status: Supplemental O2 delivered via (comment) History of Recent Intubation: No Behavior/Cognition: Alert;Distractible;Decreased sustained attention;Requires cueing;Other (comment) (delayed responses) Oral Cavity - Dentition: Adequate natural dentition Self-Feeding Abilities: Needs assist Patient Positioning: Upright in bed Baseline Vocal Quality: Hoarse;Low vocal intensity;Breathy Volitional Cough: Cognitively unable to elicit (pt declined to conduct secondary to pain) Volitional Swallow: Unable to elicit    Oral/Motor/Sensory Function Overall Oral Motor/Sensory Function: Appears within  functional limits for tasks assessed (general weakness)   Ice Chips Ice chips: Impaired Presentation: Spoon Oral Phase Impairments: Impaired anterior to posterior transit Oral Phase Functional Implications: Prolonged oral transit Pharyngeal Phase Impairments: Suspected delayed Swallow   Thin Liquid Thin Liquid: Impaired Presentation: Self Fed;Cup (hand over hand assist) Oral Phase Impairments: Impaired anterior to posterior transit Oral Phase Functional Implications: Prolonged oral transit Pharyngeal  Phase Impairments: Suspected delayed Swallow Other Comments: delayed swallow initiation, discoordinated swallow noted with thin, ? subtle throat clearing    Nectar Thick Nectar Thick Liquid: Impaired Presentation: Cup;Self Fed Oral Phase Impairments: Reduced lingual movement/coordination;Impaired anterior to posterior transit Pharyngeal Phase Impairments: Suspected delayed Swallow   Honey Thick Honey Thick Liquid: Not tested   Puree Puree: Impaired Presentation: Self Fed;Spoon (w/assist) Oral Phase Impairments: Reduced lingual movement/coordination;Impaired anterior to posterior transit Oral Phase Functional Implications: Prolonged oral transit Pharyngeal Phase Impairments: Suspected delayed Swallow Other Comments: pt masticating applesauce, delayed!   Solid   GO    Solid: Not tested Other Comments: secondary to aspiration risk       Donavan Burnet, MS [facility] SLP [phone]

## 2012-11-24 NOTE — Progress Notes (Signed)
PULMONARY  / CRITICAL CARE MEDICINE  Name: [name] [name] MRN: [id number] DOB: Nov 12, 1945    ADMISSION DATE:  11/11/2012 CONSULTATION DATE:  11/12/2012  REFERRING MD :  CCS PRIMARY SERVICE:  PCCM  CHIEF COMPLAINT:  Post op respiratory failure  BRIEF PATIENT DESCRIPTION: 67 yo with alcoholic liver disease was admitted on 5/7 with abdominal pain and was found on 5/8 to have a small bowel perforation.  She was taken to the OR and returned to the ICU with an open wound and mechanically ventilated.   SIGNIFICANT EVENTS / STUDIES:  5/7  Admitted with abdominal pain, nausea, vomiting 5/7  OR >>> Perforated viscus, peritonitis, lysis of adhesions, small bowel repair 5/16 Reintubated r/t complete left sided collapse, increased WBC 5/18 Extubated, WBC decreased, TF started  LINES / TUBES: OETT 5/8 >>>5/15>>>Reintubated 5/16>>> 5/18 NGT 5/8 >>> Foley 5/8 >>> R rad A-line 5/8 >>>5/12>>>5/16>>>5/18 L IJ CVL 5/8 >>>5/19 R Brach PICC>>>5/19  CULTURES: 5/8 Blood >>>negative 5/7 Urine >>>negative Blood 5/15>>>NGTD Urine 5/15>>>NGTD Sputum 5/15>>> few WBC, PMN and mononuclear Sputum via bronch 5/16>>>  few WBC, PMN and mononuclear  ANTIBIOTICS: Aztreonam 5/8 >>>5/9 Flagyl 5/8 >>>5/9 Levaquin 5/8 x 1 Cipro 5/8 x 1 ertapenem 5/9>>>5/16 Vanc 5/15 >>>5/19 Primaxin 5/16 >>>  Interval events: Up to side of bed with PT. Still with decreased mental status  VITAL SIGNS: Temp:  [97.9 F (36.6 C)-99.5 F (37.5 C)] 99.5 F (37.5 C) (05/20 0800) Pulse Rate:  [72-94] 84 (05/20 0600) Resp:  [18-27] 21 (05/20 0600) BP: (87-114)/(33-67) 98/46 mmHg (05/20 0600) SpO2:  [93 %-99 %] 99 % (05/20 0600)  HEMODYNAMICS: CVP:  [9 mmHg-11 mmHg] 9 mmHg  2 liters [place]  INTAKE / OUTPUT: Intake/Output     05/19 0701 - 05/20 0700 05/20 0701 - 05/21 0700   I.V. (mL/kg) 600 (6.2)    Other     NG/GT 700    IV Piggyback 400    TPN 1552.8    Total Intake(mL/kg) 3252.8 (33.4)    Urine (mL/kg/hr) 2660  (1.1)    Drains 150 (0.1)    Stool 1 (0)    Total Output 2811     Net +441.8          Stool Occurrence 1 x     PHYSICAL EXAMINATION: General: Elderly, ill appearing female, lying in bed HEENT: NCAT, EOM intact, neck supple PULM: rhonchi bilaterally, no wheezes CV: RRR, no mgr AB: Central wound vac in place, hyperactive bowel sounds. ABD tenderness present Ext: Thin skin with ecchymosis, anasarca improved, SCD's in place. Neuro: AxO to person and place only, rass -1 , follows simple commands, moves all ext.  LABS:  Recent Labs Lab 11/18/12 0455 11/19/12 0421 11/20/12 0505 11/20/12 0959  PHART 7.516* 7.447 7.497* 7.466*  PCO2ART 39.1 47.0* 42.9 46.8*  PO2ART 86.5 116.0* 68.3* 250.0*  HCO3 31.3* 31.7* 32.9* 33.3*  TCO2 28.3 28.8 29.9 30.5  O2SAT 96.9 98.2 93.9 99.4    Recent Labs Lab 11/22/12 0400 11/23/12 0430 11/24/12 0400  NA 138 139 139  K 3.1* 3.5 3.5  CL 104 104 103  CO2 32 30 32  BUN 26* 25* 24*  CREATININE 0.87 0.80 0.76  GLUCOSE 164* 157* 162*    Recent Labs Lab 11/22/12 0400 11/23/12 0430 11/24/12 0400  HGB 8.4* 8.5* 8.7*  HCT 27.3* 27.3* 28.1*  WBC 13.8* 14.4* 15.1*  PLT 103* 116* 151   Lab Results  Component Value Date   INR 1.82* 11/24/2012   INR 1.82*  11/21/2012   INR 2.00* 11/20/2012    Recent Labs Lab 11/23/12 1617 11/23/12 1951 11/23/12 2357 11/24/12 0420 11/24/12 0738  GLUCAP 181* 154* 165* 145* 170*   CXR: 5/20 - low lung volumes with vascular crowding, atelectasis, vascular congestion and probable small effusions   ASSESSMENT / PLAN:  PULMONARY A:  Acute post operative respiratory failure.   - S/p extubation on 5/18, PCXR as above. Still has poor cough mechanics.  Diuresis continued yesterday, but still over 400 ml positive yesterday. Respiratory status still of concern. P:   -Aggressive pulmonary hygiene -Chest PT  -Continue PT and OT -OOB twice daily to chair -Follow PCXR -Continue diuresis x 1  dose  CARDIOVASCULAR A: Sepsis / septic shock secondary to peritonitis.-->resolved - off pressors, last echo in 2007 showed EF of 55-65% with some mitral valve calcification.  P:  - Telemetry monitoring - Diuresis as above   RENAL A:  AKI - resolved Hypokalemia Hypomagnesemia P:   - Diuresis as ordered - Replace K - F/u BMP - Follow I&O   GASTROINTESTINAL A:  Alcoholic cirrhosis and Acute alcoholic hepatitis.   Transaminitis - r/t above Perforated viscus in jejunem s/p repair 5/8.  Protein malnutrition CT of abdomen and pelvis 5/15>>>no free air or leak. Cirrhosis, stable left hepatic lobe mass (from 2013 imaging), moderate ascites present.   P:   - Send alpha-fetoprotein with known hepatic mass. - NPO with ice chips per speech yesterday - Will remove NG tube as this may be hindering her swallowing function - Once NG removed will repeat swallow eval. - If unable to take in sufficient calories, place dobhoff and restart TF. - Will follow nutrition recs - Stop TPN as soon as taking sufficient PO/TF - Protonix for GI Px. - Wound vac in place per surgery - See ID section.  HEMATOLOGIC A:  Coagulopathy - presume r/t liver disease. Significant intraoperative bleeding controlled with platelets and plasma. INR stable, PLT improved to 151       Post-op anemia: hgb holding s/p transfusions P:  - Daily CBC  INFECTIOUS A:  Peritonitis.  PCN / Cephalosporin allergy.   PNA vs pulmonary edema - WBC increased, but afebrile P:   - Cultures have been negative thus far - Antibiotics as per flowsheet above. - Follow fever curve and WBC  ENDOCRINE  A:  Hyperglycemia. P:   - SSI prn - Check TSH, Free T4  NEUROLOGIC A:  Acute encephalopathy - ammonia remains normal  H/o Alcohol abuse Risk for delirium -  given LOS in ICU, also has reversal sleep wake cycle. seroquel added 5/19  P:   - Supportive care - OOB, PT and OT - Thiamin / Folate. - PRN fentanyl for pain - Avoid  benzo's   I have personally obtained a history, examined the patient, evaluated laboratory and imaging results, formulated the assessment and plan and placed orders.   Updated family at bedside.  Coralyn Helling, MD [facility] Pulmonary/Critical Care 11/24/2012, 8:50 AM Pager:  [phone] After 3pm call: [phone]

## 2012-11-24 NOTE — Progress Notes (Signed)
Agree with A&P of ED,NP. Would like to be able to start PO's if swallowing OK

## 2012-11-24 NOTE — Progress Notes (Addendum)
PARENTERAL NUTRITION CONSULT NOTE   Pharmacy Consult for TPN Indication: Intolerance to enteral feeding, s/p repair of bowel perforation  Allergies  Allergen Reactions  . Etomidate     Myoclonus side effect pronounced  . Cephalexin Rash  . Penicillins Rash    Patient Measurements: Height: 5\' 5"  (165.1 cm) Weight: 214 lb 8.1 oz (97.3 kg) IBW/kg (Calculated) : 57  Vital Signs: Temp: 98.7 F (37.1 C) (05/20 0400) Temp src: Oral (05/20 0400) BP: 98/46 mmHg (05/20 0600) Pulse Rate: 84 (05/20 0600) Intake/Output from previous day: 05/19 0701 - 05/20 0700 In: 3252.8 [I.V.:600; NG/GT:700; IV Piggyback:400; TPN:1552.8] Out: 2811 [Urine:2660; Drains:150; Stool:1]  Labs:  Recent Labs  11/22/12 0400 11/23/12 0430 11/24/12 0400  WBC 13.8* 14.4* 15.1*  HGB 8.4* 8.5* 8.7*  HCT 27.3* 27.3* 28.1*  PLT 103* 116* 151  INR  --   --  1.82*     Recent Labs  11/22/12 0400 11/23/12 0430 11/24/12 0400  NA 138 139 139  K 3.1* 3.5 3.5  CL 104 104 103  CO2 32 30 32  GLUCOSE 164* 157* 162*  BUN 26* 25* 24*  CREATININE 0.87 0.80 0.76  CALCIUM 7.9* 7.9* 7.7*  MG  --  1.8 2.1  PHOS  --  3.2  --   PROT 5.8* 5.9*  --   ALBUMIN 1.4* 1.3*  --   AST 77* 104*  --   ALT 33 36*  --   ALKPHOS 117 149*  --   BILITOT 2.9* 3.8*  --   PREALBUMIN  --  4.4*  --   TRIG  --  77  --   CHOL  --  49  --    Estimated Creatinine Clearance: 82 ml/min (by C-G formula based on Cr of 0.76).    Recent Labs  11/23/12 1951 11/23/12 2357 11/24/12 0420  GLUCAP 154* 165* 145*    Nutritional Goals:  RD Recommendation 5/19: Recommend discontinuing Resource Beneprotein  Recommend increasing TF rate of Jevity 1.2 to 30 ml/hr to provide 864 kcal, 40 grams of protein, and 583 ml of free water. Jevity 1.2 @ 30 ml/hr and Clinamix E 5/15 @ 50 ml/hr will provide 1922 kcal and 100 grams of protein and will meet 98% of re-estimated energy needs and 100% of re-estimated protein needs.  If pt tolerates TF @ 30  ml/hr, recommend decreasing/stopping TPN and increasing TF to goal. Recommendations: Start with Jevity 1.2 @ 30 ml/hr via NG tube and increase by 10 ml every 12 hours to goal rate of 70 ml/hr. 30 ml Prostat once daily. At goal rate, tube feeding regimen will provide 2116 kcal, 108 grams of protein, and 1360 ml of H2O.   Current nutrition:   Diet: NPO  IVF: [facility] at [facility]  Beneprotein per feeding tube   Jevity 1.2 at 20ml/hr  CBGs & Insulin requirements past 24 hours:   CBGs: above goal of 150mg /dl  Resistant SSI: 27 units/ 24hrs  Insulin 25 units/L Bag  Assessment:   67 yo F admit 5/7 with abdominal pain and on 5/8 went to OR for exp laparatomy with repair of perforated small bowel and abdominal wall debridement.  Due to diffuse peritonitis, wound closure was delayed; pt was returned to OR on 5/10 for peritoneal washout, debridement, abdominal wall closure.  Re-intubated 5/16 with worsening pulmonary process, extubated 5/18  Tube feeds were started 5/10, but d/c on 5/12 d/t high residuals ( ). TF resumed 5/17 and patient tolerating at 42ml/hr  5/19 Bedside Swallow  assessment:  Recommends NPO/ice  Prealbumin recheck reveals severely depleted protein stores.   Labs:   Electrolytes: K+ =3.5. Ca = 7.7 but corrects to 9.9, other electrolytes WNL  Renal function: Scr has improved to baseline  Hepatic function: AST/ALT, alk phos are elevated/stable today. Hx of alcoholic cirrhosis noted, monitor trend.   Pre-Albumin: 6.9 (5/13), pending (5/19)  TG/Cholesterol: wnl (5/13), 77 (5/19)  Glucose:  Slightly above goal range (< 150 mg/dL) - will adjust insulin in TNA  Plan:   On Jevity 1.2 at 76ml/hr + beneprotein QID and tolerating (provides 28gm protein and 750 kcal/24h).  Based on swallow study results, f/u plans to advance TF rate. No residuals, + diarrhea likely d/t TF Continue Clinimix E5/15 at reduced rate of 75ml/hr Continue insulin in TPN to 25 units/bag  Expect increase  in CBGs secondary to increase TF rate 5/18 Fat emulsion at 10 ml/hr (MWF only due to ongoing shortage). TNA to contain standard multivitamins and trace elements (MWF only due to ongoing shortage). Continue IVF at 10-20 ml/hr. Continue resistant SSI q4h. TNA lab panels on Mondays & Thursdays.  Juliette Alcide, PharmD, BCPS.   Pager: [phone] 7:16 AM 11/24/2012

## 2012-11-25 ENCOUNTER — Inpatient Hospital Stay (HOSPITAL_COMMUNITY): Payer: BC Managed Care – PPO

## 2012-11-25 LAB — GLUCOSE, CAPILLARY
Glucose-Capillary: 116 mg/dL — ABNORMAL HIGH (ref 70–99)
Glucose-Capillary: 121 mg/dL — ABNORMAL HIGH (ref 70–99)
Glucose-Capillary: 117 mg/dL — ABNORMAL HIGH (ref 70–99)
Glucose-Capillary: 125 mg/dL — ABNORMAL HIGH (ref 70–99)
Glucose-Capillary: 119 mg/dL — ABNORMAL HIGH (ref 70–99)
Glucose-Capillary: 128 mg/dL — ABNORMAL HIGH (ref 70–99)

## 2012-11-25 LAB — BASIC METABOLIC PANEL
BUN: 23 mg/dL (ref 6–23)
CO2: 30 mEq/L (ref 19–32)
Chloride: 105 mEq/L (ref 96–112)
GFR calc Af Amer: >90 mL/min (ref >90)
Glucose, Bld: 130 mg/dL — ABNORMAL HIGH (ref 70–99)
Potassium: 3.7 mEq/L (ref 3.5–5.1)

## 2012-11-25 LAB — CULTURE, BLOOD (ROUTINE X 2)

## 2012-11-25 LAB — CBC
Hemoglobin: 8.5 g/dL — ABNORMAL LOW (ref 12.0–15.0)
MCH: 29.7 pg (ref 26.0–34.0)
MCV: 94.8 fL (ref 78.0–100.0)
RBC: 2.86 MIL/uL — ABNORMAL LOW (ref 3.87–5.11)

## 2012-11-25 MED ORDER — CLINIMIX E/DEXTROSE (5/15) 5 % IV SOLN
INTRAVENOUS | Status: DC
  Administered 2012-11-25: 18:00:00 via INTRAVENOUS
  Filled 2012-11-25: qty 1000

## 2012-11-25 MED ORDER — FUROSEMIDE 10 MG/ML IJ SOLN
40.0000 mg | Freq: Three times a day (TID) | INTRAMUSCULAR | Status: AC
  Administered 2012-11-25: 40 mg via INTRAVENOUS
  Filled 2012-11-25: qty 4

## 2012-11-25 MED ORDER — LEVOTHYROXINE SODIUM 25 MCG PO TABS
25.0000 ug | ORAL_TABLET | Freq: Every day | ORAL | Status: DC
  Administered 2012-11-25 – 2012-11-29 (×5): 25 ug via ORAL
  Filled 2012-11-25 (×7): qty 1

## 2012-11-25 MED ORDER — FAT EMULSION 20 % IV EMUL: 240.0000 mL | INTRAVENOUS | Status: DC

## 2012-11-25 MED ORDER — FAT EMULSION 20 % IV EMUL
240.0000 mL | INTRAVENOUS | Status: DC
  Administered 2012-11-25: 240 mL via INTRAVENOUS
  Filled 2012-11-25: qty 250

## 2012-11-25 MED ORDER — ADULT MULTIVITAMIN W/MINERALS CH
1.0000 | ORAL_TABLET | Freq: Every day | ORAL | Status: DC
  Administered 2012-11-25 – 2012-12-03 (×10): 1 via ORAL
  Filled 2012-11-25 (×9): qty 1

## 2012-11-25 MED ORDER — INSULIN REGULAR HUMAN 100 UNIT/ML IJ SOLN: INTRAVENOUS | Status: DC

## 2012-11-25 MED ORDER — POTASSIUM CHLORIDE CRYS ER 20 MEQ PO TBCR
40.0000 meq | EXTENDED_RELEASE_TABLET | Freq: Once | ORAL | Status: AC
  Administered 2012-11-25: 40 meq via ORAL
  Filled 2012-11-25: qty 2

## 2012-11-25 MED ORDER — PANTOPRAZOLE SODIUM 40 MG PO TBEC
40.0000 mg | DELAYED_RELEASE_TABLET | Freq: Every day | ORAL | Status: DC
  Administered 2012-11-25 – 2012-12-03 (×9): 40 mg via ORAL
  Filled 2012-11-25 (×9): qty 1

## 2012-11-25 NOTE — Progress Notes (Signed)
PULMONARY  / CRITICAL CARE MEDICINE  Name: [name] [name] MRN: [id number] DOB: [birth year]    ADMISSION DATE:  11/11/2012 CONSULTATION DATE:  11/12/2012  REFERRING MD :  CCS PRIMARY SERVICE:  PCCM  CHIEF COMPLAINT:  Post op respiratory failure  BRIEF PATIENT DESCRIPTION: 67 yo with alcoholic liver disease was admitted on 5/7 with abdominal pain and was found on 5/8 to have a small bowel perforation.  She was taken to the OR and returned to the ICU with an open wound and mechanically ventilated.   SIGNIFICANT EVENTS / STUDIES:  5/7  Admitted with abdominal pain, nausea, vomiting 5/7  OR >>> Perforated viscus, peritonitis, lysis of adhesions, small bowel repair 5/16 Reintubated r/t complete left sided collapse, increased WBC 5/18 Extubated, WBC decreased, TF started  LINES / TUBES: OETT 5/8 >>>5/15>>>Reintubated 5/16>>> 5/18 NGT 5/8 >>> 5/20 Foley 5/8 >>> R rad A-line 5/8 >>>5/12>>>5/16>>>5/18 L IJ CVL 5/8 >>>5/19 R Brach PICC>>>5/19  CULTURES: 5/8 Blood >>>negative 5/7 Urine >>>negative Blood 5/15>>>neg Urine 5/15>>>neg Sputum 5/15>>>neg Sputum via bronch 5/16>>>  Neg  ANTIBIOTICS: Aztreonam 5/8 >>>5/9 Flagyl 5/8 >>>5/9 Levaquin 5/8 x 1 Cipro 5/8 x 1 ertapenem 5/9>>>5/16 Vanc 5/15 >>>5/19 Primaxin 5/16 >>>5/21  Interval events: Doing well.  Nasal cannula. NG out   VITAL SIGNS: Temp:  [98 F (36.7 C)-98.8 F (37.1 C)] 98.2 F (36.8 C) (05/21 0800) Pulse Rate:  [79-92] 80 (05/21 0600) Resp:  [20-26] 21 (05/21 0600) BP: (102-130)/(51-69) 115/68 mmHg (05/21 0600) SpO2:  [94 %-98 %] 96 % (05/21 0759) Weight:  [102 kg (224 lb 13.9 oz)] 102 kg (224 lb 13.9 oz) (05/21 0500)      2 liters [place]  INTAKE / OUTPUT: Intake/Output     05/20 0701 - 05/21 0700 05/21 0701 - 05/22 0700   I.V. (mL/kg) 460 (4.5)    NG/GT 40    IV Piggyback 400    TPN 975    Total Intake(mL/kg) 1875 (18.4)    Urine (mL/kg/hr) 850 (0.3)    Drains     Stool 3 (0)    Total Output 853      Net +1022           PHYSICAL EXAMINATION: General: Elderly, ill appearing female, lying in bed HEENT: NCAT, EOM intact, neck supple PULM: rhonchi bilaterally but decreased, no wheezes CV: RRR, no mgr AB: Central wound vac in place, hyperactive bowel sounds. ABD tenderness present Ext: Thin skin with ecchymosis, anasarca improved, SCD's in place. Neuro: AxO x3 ,  Awake and alert.    LABS:  Recent Labs Lab 11/19/12 0421 11/20/12 0505 11/20/12 0959 11/24/12 1444  PHART 7.447 7.497* 7.466* 7.498*  PCO2ART 47.0* 42.9 46.8* 38.6  PO2ART 116.0* 68.3* 250.0* 68.5*  HCO3 31.7* 32.9* 33.3* 29.7*  TCO2 28.8 29.9 30.5 26.4  O2SAT 98.2 93.9 99.4 93.7    Recent Labs Lab 11/23/12 0430 11/24/12 0400 11/25/12 0340  NA 139 139 139  K 3.5 3.5 3.7  CL 104 103 105  CO2 30 32 30  BUN 25* 24* 23  CREATININE 0.80 0.76 0.74  GLUCOSE 157* 162* 130*    Recent Labs Lab 11/23/12 0430 11/24/12 0400 11/25/12 0340  HGB 8.5* 8.7* 8.5*  HCT 27.3* 28.1* 27.1*  WBC 14.4* 15.1* 14.0*  PLT 116* 151 153   Lab Results  Component Value Date   INR 1.82* 11/24/2012   INR 1.82* 11/21/2012   INR 2.00* 11/20/2012    Recent Labs Lab 11/24/12 1150 11/24/12 1916 11/24/12 2348  11/25/12 0335 11/25/12 0746  GLUCAP 149* 143* 121* 116* 117*   CXR: 5/21 -persistent vasc congestion.  Edema, ATX at bases   ASSESSMENT / PLAN:  PULMONARY A:  Acute post operative respiratory failure.   - S/p extubation on 5/18, PCXR as above. Still has poor cough mechanics.  Pos I/O still P:   -cont Aggressive pulmonary hygiene -Chest PT  -Continue PT and OT -OOB twice daily to chair -Continue diuresis   CARDIOVASCULAR A: Sepsis / septic shock secondary to peritonitis.-->resolved - off pressors, last echo in 2007 showed EF of 55-65% with some mitral valve calcification.  P:  - Telemetry monitoring - Diuresis as above   RENAL A:  AKI - resolved  P:   - Diuresis as ordered - F/u BMP - Follow I&O    GASTROINTESTINAL A:  Alcoholic cirrhosis and Acute alcoholic hepatitis.   Transaminitis - improved Perforated viscus in jejunem s/p repair 5/8.  Protein malnutrition  P:   - start dysphagia diet - Will follow nutrition recs - Stop TPN as soon as taking sufficient PO/TF - Protonix for GI Px. - Wound vac in place per surgery   HEMATOLOGIC A:  Coagulopathy - presume r/t liver disease. Significant intraoperative bleeding controlled with platelets and plasma. INR stable, PLT improved to 151       Post-op anemia: hgb holding s/p transfusions P:  - Daily CBC  INFECTIOUS A:  Peritonitis.  PCN / Cephalosporin allergy.   PNA vs pulmonary edema - WBC increased, but afebrile P:   - Cultures have been negative thus far -d/c all ABX and follow  ENDOCRINE  A:  Hyperglycemia. P:   - SSI prn - Check TSH, Free T4  NEUROLOGIC A:  Acute encephalopathy - ammonia remains normal  H/o Alcohol abuse Risk for delirium -  given LOS in ICU, also has reversal sleep wake cycle. seroquel added 5/19   P:   - Supportive care - OOB, PT and OT - Thiamine / Folate. - PRN fentanyl for pain - Avoid benzo's  I have personally obtained a history, examined the patient, evaluated laboratory and imaging results, formulated the assessment and plan and placed orders.     Dorcas Carrow [facility] Pulmonary/Critical Care 11/25/2012, 8:44 AM Beeper  [phone]  Cell  [phone]  If no response or cell goes to voicemail, call beeper [phone]

## 2012-11-25 NOTE — Progress Notes (Signed)
Speech Language Pathology Discharge Patient Details Name: [name] [name] MRN: [id number] DOB: 1946/01/12 Today's Date: 11/25/2012    Patient discharged from SLP services secondary to MD cancelled order.  Please reorder if desire. Daphene Calamity, MS [facility] SLP [phone]

## 2012-11-25 NOTE — Progress Notes (Signed)
Physical Therapy Treatment Patient Details Name: [name] [name] MRN: [id number] DOB: Feb 17, 1946 Today's Date: 11/25/2012 Time: 4098-1191 PT Time Calculation (min): 36 min  PT Assessment / Plan / Recommendation Comments on Treatment Session  Pt. did not stand, appeared to maybe be able to but had decreased effort. Pt had difficulty with her voice today.pt is very weak. recommend SNF.    Follow Up Recommendations  SNF;Supervision/Assistance - 24 hour     Does the patient have the potential to tolerate intense rehabilitation     Barriers to Discharge        Equipment Recommendations  None recommended by PT    Recommendations for Other Services    Frequency Min 3X/week   Plan Discharge plan remains appropriate;Frequency remains appropriate    Precautions / Restrictions Precautions Precautions: Fall Precaution Comments: abd surgery, vac, abd binder when up. Restrictions Weight Bearing Restrictions: No   Pertinent Vitals/Pain Pt did not c/o pain. sats >95 on RA replaced on 2 L.  BP 98/72.    Mobility  Bed Mobility Bed Mobility: Rolling Right;Rolling Left;Supine to Sit;Sit to Supine Rolling Right: 1: +2 Total assist Rolling Right: Patient Percentage: 10% Rolling Left: 1: +2 Total assist Rolling Left: Patient Percentage: 10% Supine to Sit: 1: +2 Total assist Supine to Sit: Patient Percentage: 20% Details for Bed Mobility Assistance: increased time and multimodal cues needed.   Transfers Transfers: Sit to [employer] Transfers Details for Transfer Assistance: attempted to stand; used maxisky lift to chair.  Applied sling when sitting eob Ambulation/Gait Ambulation/Gait Assistance: Not tested (comment)    Exercises     PT Diagnosis:    PT Problem List:   PT Treatment Interventions:     PT Goals Acute Rehab PT Goals Pt will go Supine/Side to Sit: with mod assist PT Goal: Supine/Side to Sit - Progress: Progressing toward goal Pt will go Sit to Stand: with mod  assist PT Goal: Sit to Stand - Progress: Progressing toward goal Pt will Transfer Bed to Chair/Chair to Bed: with mod assist PT Transfer Goal: Bed to Chair/Chair to Bed - Progress: Progressing toward goal  Visit Information  Last PT Received On: 11/25/12 Assistance Needed: +2    Subjective Data  Subjective: I am weak.   Cognition  Cognition Arousal/Alertness: Lethargic Behavior During Therapy: WFL for tasks assessed/performed Overall Cognitive Status: Impaired/Different from baseline (baseline unknown) Area of Impairment:  (slow processing)    Balance  Static Sitting Balance Static Sitting - Balance Support: Bilateral upper extremity supported;Feet supported Static Sitting - Level of Assistance: 5: Stand by assistance Static Sitting - Comment/# of Minutes: 8 minutes eob  End of Session PT - End of Session Activity Tolerance: Patient tolerated treatment well Patient left: in chair;with call bell/phone within reach Nurse Communication: Need for lift equipment   GP     [name] [name], [place] 11/25/2012, 1:28 PM  Blanchard Kelch PT [phone]

## 2012-11-25 NOTE — Progress Notes (Signed)
Patient ID: [name] [name], female   DOB: 1946-03-12, 67 y.o.   MRN: [id number]  Wound VAC removed.  Midline incision is beefy red with adequate granulation tissue, fascia is intact.  Will start BID NS wet to dry dressing changes. Maximize nutrition and increase activity as tolerated.  Bobie Kistler, ANP-BC p [phone]

## 2012-11-25 NOTE — Progress Notes (Signed)
PARENTERAL NUTRITION CONSULT NOTE   Pharmacy Consult for TPN Indication: Intolerance to enteral feeding, s/p repair of bowel perforation  Allergies  Allergen Reactions  . Etomidate     Myoclonus side effect pronounced  . Cephalexin Rash  . Penicillins Rash    Patient Measurements: Height: 5\' 5"  (165.1 cm) Weight: 224 lb 13.9 oz (102 kg) IBW/kg (Calculated) : 57  Vital Signs: Temp: 98.4 F (36.9 C) (05/21 0400) Temp src: Oral (05/21 0400) BP: 115/68 mmHg (05/21 0600) Pulse Rate: 80 (05/21 0600) Intake/Output from previous day: 05/20 0701 - 05/21 0700 In: 1875 [I.V.:460; NG/GT:40; IV Piggyback:400; TPN:975] Out: 853 [Urine:850; Stool:3]  Labs:  Recent Labs  11/23/12 0430 11/24/12 0400 11/25/12 0340  WBC 14.4* 15.1* 14.0*  HGB 8.5* 8.7* 8.5*  HCT 27.3* 28.1* 27.1*  PLT 116* 151 153  INR  --  1.82*  --      Recent Labs  11/23/12 0430 11/24/12 0400 11/25/12 0340  NA 139 139 139  K 3.5 3.5 3.7  CL 104 103 105  CO2 30 32 30  GLUCOSE 157* 162* 130*  BUN 25* 24* 23  CREATININE 0.80 0.76 0.74  CALCIUM 7.9* 7.7* 7.8*  MG 1.8 2.1 2.2  PHOS 3.2  --   --   PROT 5.9*  --   --   ALBUMIN 1.3*  --   --   AST 104*  --   --   ALT 36*  --   --   ALKPHOS 149*  --   --   BILITOT 3.8*  --   --   PREALBUMIN 4.4*  --   --   TRIG 77  --   --   CHOL 49  --   --    Estimated Creatinine Clearance: 84.1 ml/min (by C-G formula based on Cr of 0.74).    Recent Labs  11/24/12 1916 11/24/12 2348 11/25/12 0335  GLUCAP 143* 121* 116*    Nutritional Goals:  Kcal: 1950-2140  Protein: 97-117 grams  RD Recommendation 5/19: Recommend discontinuing Resource Beneprotein  Recommend increasing TF rate of Jevity 1.2 to 30 ml/hr to provide 864 kcal, 40 grams of protein, and 583 ml of free water. Jevity 1.2 @ 30 ml/hr and Clinamix E 5/15 @ 50 ml/hr will provide 1922 kcal and 100 grams of protein and will meet 98% of re-estimated energy needs and 100% of re-estimated protein needs.   If pt tolerates TF @ 30 ml/hr, recommend decreasing/stopping TPN and increasing TF to goal. Recommendations: Start with Jevity 1.2 @ 30 ml/hr via NG tube and increase by 10 ml every 12 hours to goal rate of 70 ml/hr. 30 ml Prostat once daily. At goal rate, tube feeding regimen will provide 2116 kcal, 108 grams of protein, and 1360 ml of H2O.   Current nutrition:   Diet: Dysphagia 1 diet start 5/21  IVF: NS at [facility]  CBGs & Insulin requirements past 24 hours:   CBGs: controlled with stop of TF  Resistant SSI: 19 units/ 24hrs  Insulin 25 units/L Bag  Assessment:   67 yo F admit 5/7 with abdominal pain and on 5/8 went to OR for exp laparatomy with repair of perforated small bowel and abdominal wall debridement.  Due to diffuse peritonitis, wound closure was delayed; pt was returned to OR on 5/10 for peritoneal washout, debridement, abdominal wall closure.  Re-intubated 5/16 with worsening pulmonary process, extubated 5/18  Tube feeds were started 5/10, but d/c on 5/12 d/t high residuals ( ). TF resumed  5/17 and patient tolerating at 44ml/hr then d/c'd 5/20 to start PO  Prealbumin recheck reveals severely depleted protein stores.   Repeat bedside swallow eval 5/20 rec D1 diet.   Labs:   Electrolytes: K+ =3.7. Ca = 7.8 but corrects to 9.8, other electrolytes WNL  Renal function: Scr has improved to baseline  Hepatic function: AST/ALT, alk phos are elevated/stable . Hx of alcoholic cirrhosis noted, monitor trend.   Pre-Albumin: 6.9 (5/13), 4.4(5/19)  TG/Cholesterol: wnl (5/13), 77 (5/19)  Glucose:  Controlled, at goal 150mg /dl, expect improvement from TF being stopped for start of diet with no PO intake.   Plan:   Continue Clinimix E5/15 at reduced rate of 33ml/hr TPN below nutritional needs, meeting ~50% goal (was adjusted for protein supplement/Jevity 1.2), orders to start diet today will continue at reduced rate but does not eat well may need to increase TPN rate (if TF  not resumed if fails PO) Continue insulin in TPN to 25 units/bag  Expect increase in CBGs secondary to increase TF rate 5/18 Fat emulsion at 10 ml/hr (MWF only due to ongoing shortage). Start daily PO multivitamin in place of IV trace/MVI d/t national backorder Continue IVF at 10-20 ml/hr. Continue resistant SSI q4h. TNA lab panels on Mondays & Thursdays.  Juliette Alcide, PharmD, BCPS.   Pager: [phone] 6:48 AM 11/25/2012

## 2012-11-25 NOTE — Clinical Social Work Note (Signed)
CSW following for support and assistance with dispo. CSW aware Pt will need SNF after d/c. CSW left VM for Pt's husband re. Need for SNF. CSW also left SNF packet in the room. CSW to begin SNF process once husband aware and agrees.   Doreen Salvage, LCSW ICU/Stepdown Clinical Social Worker [facility] Cell [phone] Hours 8am-1200pm M-F

## 2012-11-25 NOTE — Progress Notes (Signed)
11 Days Post-Op   Assessment: s/p Procedure(s): Washout Abdominal Wound, debridement of Fascia, Partial Closure Patient Active Problem List   Diagnosis Date Noted  . Delirium 11/24/2012  . Acute respiratory failure 11/13/2012  . Septic shock(785.52) 11/13/2012  . Aspiration pneumonia 11/12/2012  . Dyspnea 11/12/2012  . Cough 11/12/2012  . Wheezing 11/12/2012  . Hypoglycemia 11/12/2012  . Transaminitis 11/12/2012  . Alcoholic hepatitis 11/12/2012  . Normocytic anemia 11/12/2012  . Ileus 11/12/2012  . Perforation of jejunum s/p primary repair 11/12/2012 11/12/2012  . Nausea & vomiting 11/11/2012  . Abdominal pain, generalized 11/11/2012  . Obesity 11/11/2012  . ARF (acute renal failure) 11/11/2012  . 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 Coagulopathy 11/11/2012  . Hyperlipidemia 11/11/2012  . Anxiety 11/11/2012  . Alcohol withdrawal 11/11/2012  . Increased bilirubin level 11/11/2012  . Gastrointestinal hemorrhage 09/25/2012  . Anemia 09/25/2012  . Alcohol abuse 09/18/2012  . Hyponatremia 09/18/2012  . Thrombocytopenia 09/18/2012  . Elevated INR 09/18/2012  . Anemia due to blood loss, acute 01/24/2012  . HYPERGLYCEMIA 10/14/2008  . DIVERTICULOSIS OF COLON 07/20/2007  . HYPERTENSION 01/30/2007  . 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 CEREBROVASCULAR ACCIDENT, HX OF 01/30/2007    Slowly improving  Plan: Will start dysphagia 1 diet, check wound when vac to be changed  Subjective: Feels OK and wants some champagne. Not much pain, no nausea with NG out  Objective: Vital signs in last 24 hours: Temp:  [98 F (36.7 C)-98.8 F (37.1 C)] 98.2 F (36.8 C) (05/21 0800) Pulse Rate:  [79-92] 80 (05/21 0600) Resp:  [20-26] 21 (05/21 0600) BP: (102-130)/(51-69) 115/68 mmHg (05/21 0600) SpO2:  [94 %-98 %] 96 % (05/21 0759) Weight:  [224 lb 13.9 oz (102 kg)] 224 lb 13.9 oz (102 kg) (05/21 0500)   Intake/Output from previous day: 05/20 0701 -  05/21 0700 In: 1875 [I.V.:460; NG/GT:40; IV Piggyback:400; TPN:975] Out: 853 [Urine:850; Stool:3]  General appearance: alert, cooperative and no distress GI: Still distended, but stable and this may be baseline. Not tender, no guarding or rebound  Incision: VAC in situ, will check at [facility] change  Lab Results:   Recent Labs  11/24/12 0400 11/25/12 0340  WBC 15.1* 14.0*  HGB 8.7* 8.5*  HCT 28.1* 27.1*  PLT 151 153   BMET  Recent Labs  11/24/12 0400 11/25/12 0340  NA 139 139  K 3.5 3.7  CL 103 105  CO2 32 30  GLUCOSE 162* 130*  BUN 24* 23  CREATININE 0.76 0.74  CALCIUM 7.7* 7.8*    MEDS, Scheduled . albuterol  2.5 mg Nebulization Q6H  . antiseptic oral rinse  15 mL Mouth Rinse QID  . chlorhexidine  15 mL Mouth Rinse BID  . feeding supplement (JEVITY 1.2 CAL)  1,000 mL Per Tube Q24H  . feeding supplement  30 mL Oral Daily  . folic acid  1 mg Oral Daily  . imipenem-cilastatin  500 mg Intravenous Q6H  . insulin aspart  0-20 Units Subcutaneous Q4H  . labetalol  100 mg Oral TID  . levothyroxine  12.5 mcg Intravenous QAC breakfast  . lip balm  1 application Topical BID  . pantoprazole sodium  40 mg Per Tube Q12H  . QUEtiapine  25 mg Oral QHS  . sodium chloride  10-40 mL Intracatheter Q12H  . thiamine  100 mg Oral Daily    Studies/Results: Dg Chest Port 1 View  11/25/2012   *RADIOLOGY REPORT*  Clinical Data: Shortness of breath.  PORTABLE CHEST - 1 VIEW  Comparison: 11/24/2012.  Findings: The right PICC line is stable.  The NG tube has been removed.  Persistent low lung volumes with vascular crowding, atelectasis, vascular congestion and small effusions.  IMPRESSION:  1.  Removal of NG tube. 2.  Persistent vascular congestion, edema, atelectasis and effusions.   Original Report Authenticated By: Rudie Meyer, M.D.   Dg Chest Port 1 View  11/24/2012   *RADIOLOGY REPORT*  Clinical Data: Follow up atelectasis.  PORTABLE CHEST - 1 VIEW  Comparison: 11/23/2012.  Findings:  The NG tube is stable.  The left IJ catheter has been removed. A new right PICC line is in good position with the tip near the cavoatrial junction.  The heart lungs appears stable with persistent low lung volumes, vascular crowding, atelectasis and effusions.  IMPRESSION:  1.  Removal of left IJ catheter. 2.  New right-sided PICC line with tip near the cavoatrial junction. 3.  Persistent low lung volumes with vascular crowding, atelectasis, vascular congestion and probable small effusions.   Original Report Authenticated By: Rudie Meyer, M.D.   Dg Chest Port 1 View  11/23/2012   *RADIOLOGY REPORT*  Clinical Data: Status post extubation  PORTABLE CHEST - 1 VIEW  Comparison: 11/22/2012  Findings: The cardiac shadow is stable.  The endotracheal tube has been removed.  The nasogastric catheter and left-sided central venous line remain in place. The lungs are well-aerated again demonstrates some mild bibasilar changes.  IMPRESSION: Interval extubation.  The remainder of the exam is stable from prior study.   Original Report Authenticated By: Alcide Clever, M.D.      LOS: 14 days     Currie Paris, MD, [facility] Surgery, [place] [phone]   11/25/2012 8:34 AM

## 2012-11-25 NOTE — Evaluation (Signed)
Occupational Therapy Evaluation Patient Details Name: [name] [name] MRN: [id number] DOB: 01-25-1946 Today's Date: 11/25/2012 Time: 4098-1191 OT Time Calculation (min): 36 min  OT Assessment / Plan / Recommendation Clinical Impression  This 67 year old female was admitted for perforation of small bowel.  She has a h/o chronic abdominal pain, ETOH, brain surgery, aneurysm (ACA) and CVA.  Pt states she was independent with ADLs prior to admission.  Pt now requires max A for upper body ADLs and A x 2 for LB ADLs due to deconditioning/weakness.  She will benefit from skilled OT to increase independence with adls.    OT Assessment  Patient needs continued OT Services    Follow Up Recommendations  CIR    Barriers to Discharge      Equipment Recommendations  3 in 1 bedside comode    Recommendations for Other Services Rehab consult  Frequency  Min 2X/week    Precautions / Restrictions Precautions Precautions: Fall Precaution Comments: abd surgery, vac Restrictions Weight Bearing Restrictions: No   Pertinent Vitals/Pain Pt c/o abdominal pain, not rated.  BP 98/61.  Sats 90's    ADL  Eating/Feeding: Moderate assistance (tremor in hand; one ice chip at a time) Where Assessed - Eating/Feeding: Chair Grooming: Maximal assistance Where Assessed - Grooming: Supported sitting Upper Body Bathing: Maximal assistance Where Assessed - Upper Body Bathing: Supported sitting Lower Body Bathing: +2 Total assistance Lower Body Bathing: Patient Percentage: 0% Where Assessed - Lower Body Bathing: Rolling right and/or left Upper Body Dressing: Maximal assistance Where Assessed - Upper Body Dressing: Supported sitting Lower Body Dressing: +2 Total assistance Lower Body Dressing: Patient Percentage: 0% Where Assessed - Lower Body Dressing: Rolling right and/or left Toileting - Clothing Manipulation and Hygiene: +2 Total assistance Toileting - Clothing Manipulation and Hygiene: Patient  Percentage: 0% Transfers/Ambulation Related to ADLs: used skylift for oob to chair.  Attempted to stand with A x 2 but unable.  Pt fearful and generally weak/deconditioned ADL Comments: Pt deconditioned which greatly impacts ADLs.      OT Diagnosis: Generalized weakness  OT Problem List: Decreased strength;Decreased activity tolerance;Decreased coordination;Decreased safety awareness;Decreased knowledge of use of DME or AE;Pain;Cardiopulmonary status limiting activity OT Treatment Interventions: Self-care/ADL training;Patient/family education;Therapeutic exercise;Energy conservation;DME and/or AE instruction;Therapeutic activities   OT Goals Acute Rehab OT Goals OT Goal Formulation: With patient Time For Goal Achievement: 12/09/12 Potential to Achieve Goals: Good ADL Goals Pt Will Perform Eating: with min assist;Sitting, chair;Supported ADL Goal: Eating - Progress: Goal set today Pt Will Perform Grooming: with min assist;Sitting, chair;Supported ADL Goal: Grooming - Progress: Goal set today Pt Will Perform Upper Body Bathing: with min assist;Sitting, chair ADL Goal: Upper Body Bathing - Progress: Goal set today Pt Will Perform Upper Body Dressing: with min assist;Sitting, chair;Supported ADL Goal: Location manager Dressing - Progress: Goal set today Miscellaneous OT Goals Miscellaneous OT Goal #1: Pt will perform bed mobility with mod A in preparation for adls/toilet transfers OT Goal: Miscellaneous Goal #1 - Progress: Goal set today Miscellaneous OT Goal #2: Pt will perform 2 sets of 10 shoulder AROM exercises x 2 motions to increase strength for adls. OT Goal: Miscellaneous Goal #2 - Progress: Goal set today Miscellaneous OT Goal #3: Pt will go from sit to stand with A x 2, pt 50% in preparation for toilet transfers  Visit Information  Last OT Received On: 11/25/12 Assistance Needed: +2    Subjective Data  Subjective: I don't think I can get up Patient Stated Goal: agreeable to OT/PT.  Unsure if she'll consider rehab   Prior Functioning     Home Living Lives With: Spouse Type of Home: House Home Access: Stairs to enter Secretary/administrator of Steps: 4 Entrance Stairs-Rails: Right Home Layout: One level Bathroom Shower/Tub: Engineer, manufacturing systems: Standard Home Adaptive Equipment: None Prior Function Level of Independence: Independent Communication Communication: No difficulties         Vision/Perception     Cognition  Cognition Arousal/Alertness: Lethargic Behavior During Therapy: WFL for tasks assessed/performed Overall Cognitive Status: Impaired/Different from baseline (baseline unknown) Area of Impairment:  (slow processing)    Extremity/Trunk Assessment Right Upper Extremity Assessment RUE ROM/Strength/Tone: Deficits RUE ROM/Strength/Tone Deficits: strength grossly 3/5 Left Upper Extremity Assessment LUE ROM/Strength/Tone: Deficits LUE ROM/Strength/Tone Deficits: strength grossly 3/5     Mobility Bed Mobility Bed Mobility: Rolling Right;Rolling Left;Supine to Sit;Sit to Supine Rolling Right: 1: +2 Total assist Rolling Right: Patient Percentage: 10% Rolling Left: 1: +2 Total assist Rolling Left: Patient Percentage: 10% Supine to Sit: 1: +2 Total assist Supine to Sit: Patient Percentage: 20% Details for Bed Mobility Assistance: increased time and multimodal cues needed.   Transfers Details for Transfer Assistance: attempted to stand; used maxisky lift to chair.  Applied sling when sitting eob     Exercise     Balance Static Sitting Balance Static Sitting - Balance Support: Bilateral upper extremity supported;Feet supported Static Sitting - Level of Assistance: 5: Stand by assistance Static Sitting - Comment/# of Minutes: 8 minutes eob   End of Session OT - End of Session Activity Tolerance: Patient limited by fatigue Patient left: in chair;with call bell/phone within reach Nurse Communication: Need for lift equipment   GO     Easton Sivertson 11/25/2012, 12:53 PM Marica Otter, OTR/L [phone] 11/25/2012

## 2012-11-26 DIAGNOSIS — D649 Anemia, unspecified: Secondary | ICD-10-CM

## 2012-11-26 DIAGNOSIS — R404 Transient alteration of awareness: Secondary | ICD-10-CM

## 2012-11-26 LAB — COMPREHENSIVE METABOLIC PANEL
ALT: 29 U/L (ref 0–35)
AST: 75 U/L — ABNORMAL HIGH (ref 0–37)
Albumin: 1.2 g/dL — ABNORMAL LOW (ref 3.5–5.2)
Calcium: 7.8 mg/dL — ABNORMAL LOW (ref 8.4–10.5)
Chloride: 107 mEq/L (ref 96–112)
Creatinine, Ser: 0.79 mg/dL (ref 0.50–1.10)
Sodium: 141 mEq/L (ref 135–145)
Total Bilirubin: 3.2 mg/dL — ABNORMAL HIGH (ref 0.3–1.2)

## 2012-11-26 LAB — GLUCOSE, CAPILLARY: Glucose-Capillary: 151 mg/dL — ABNORMAL HIGH (ref 70–99)

## 2012-11-26 LAB — PHOSPHORUS: Phosphorus: 3.8 mg/dL (ref 2.3–4.6)

## 2012-11-26 MED ORDER — INSULIN ASPART 100 UNIT/ML ~~LOC~~ SOLN
0.0000 [IU] | Freq: Three times a day (TID) | SUBCUTANEOUS | Status: DC
  Administered 2012-11-26: 3 [IU] via SUBCUTANEOUS
  Administered 2012-11-26: 4 [IU] via SUBCUTANEOUS
  Administered 2012-11-27 – 2012-11-30 (×5): 3 [IU] via SUBCUTANEOUS
  Administered 2012-11-30: 4 [IU] via SUBCUTANEOUS
  Administered 2012-12-01 – 2012-12-03 (×4): 3 [IU] via SUBCUTANEOUS

## 2012-11-26 MED ORDER — RESOURCE THICKENUP CLEAR PO POWD
ORAL | Status: DC | PRN
  Filled 2012-11-26 (×2): qty 125

## 2012-11-26 MED ORDER — BIOTENE DRY MOUTH MT LIQD
15.0000 mL | OROMUCOSAL | Status: DC
  Administered 2012-11-26 – 2012-12-03 (×36): 15 mL via OROMUCOSAL

## 2012-11-26 MED ORDER — FUROSEMIDE 10 MG/ML IJ SOLN
40.0000 mg | Freq: Three times a day (TID) | INTRAMUSCULAR | Status: AC
  Administered 2012-11-26 (×2): 40 mg via INTRAVENOUS
  Filled 2012-11-26 (×2): qty 4

## 2012-11-26 MED ORDER — BOOST / RESOURCE BREEZE PO LIQD
1.0000 | Freq: Every day | ORAL | Status: DC
  Administered 2012-11-26 – 2012-12-02 (×7): 1 via ORAL

## 2012-11-26 NOTE — Progress Notes (Addendum)
PULMONARY  / CRITICAL CARE MEDICINE  Name: [name] [name] MRN: [id number] DOB: Mar 28, 1946    ADMISSION DATE:  11/11/2012 CONSULTATION DATE:  11/12/2012  REFERRING MD :  [facility]S PRIMARY SERVICE:  PCCM  CHIEF COMPLAINT:  Post op respiratory failure  BRIEF PATIENT DESCRIPTION: 67 yo with alcoholic liver disease was admitted on 5/7 with abdominal pain and was found on 5/8 to have a small bowel perforation.  She was taken to the OR and returned to the ICU with an open wound and mechanically ventilated.   SIGNIFICANT EVENTS / STUDIES:  5/7  Admitted with abdominal pain, nausea, vomiting 5/7  OR >>> Perforated viscus, peritonitis, lysis of adhesions, small bowel repair 5/16 Reintubated r/t complete left sided collapse, increased WBC 5/18 Extubated, WBC decreased, TF started 5/20 Speech assessment >> D1 diet 5/22 D/c TPN, transfer to non tele floor bed  LINES / TUBES: OETT 5/8 >>>5/15>>>Reintubated 5/16>>> 5/18 NGT 5/8 >>> 5/20 Foley 5/8 >>> R rad A-line 5/8 >>>5/12>>>5/16>>>5/18 L IJ CVL 5/8 >>>5/19 R Brach PICC>>>5/19  CULTURES: 5/8 Blood >>>negative 5/7 Urine >>>negative Blood 5/15>>>neg Urine 5/15>>>neg Sputum 5/15>>>neg Sputum via bronch 5/16>>>  Neg  ANTIBIOTICS: Aztreonam 5/8 >>>5/9 Flagyl 5/8 >>>5/9 Levaquin 5/8 x 1 Cipro 5/8 x 1 ertapenem 5/9>>>5/16 Vanc 5/15 >>>5/19 Primaxin 5/16 >>>5/21  Interval events: Doing well.  No acute complaints this morning  VITAL SIGNS: Temp:  [98 F (36.7 C)-99.2 F (37.3 C)] 98.4 F (36.9 C) (05/22 0800) Pulse Rate:  [80-92] 86 (05/22 0750) Resp:  [19-27] 22 (05/22 0750) BP: (88-130)/(41-69) 113/61 mmHg (05/22 0750) SpO2:  [94 %-99 %] 95 % (05/22 0750) Weight:  [102.3 kg (225 lb 8.5 oz)] 102.3 kg (225 lb 8.5 oz) (05/22 0500)   2 liters [place]  INTAKE / OUTPUT: Intake/Output     05/21 0701 - 05/22 0700 05/22 0701 - 05/23 0700   P.O. 30    I.V. (mL/kg) 480 (4.7) 20 (0.2)   NG/GT     IV Piggyback     TPN 970 50   Total  Intake(mL/kg) 1480 (14.5) 70 (0.7)   Urine (mL/kg/hr) 1125 (0.5)    Stool     Total Output 1125     Net +355 +70         PHYSICAL EXAMINATION: General: Elderly, ill appearing female, lying in bed HEENT: NCAT, EOM intact, neck supple PULM: rhonchi bilaterally but decreased, no wheezes CV: RRR, no mgr AB: Central wound vac in place, hyperactive bowel sounds. ABD non-tender Ext: Thin skin with ecchymosis, anasarca improved, SCD's in place. Neuro: AxO x3 follows commands  LABS:  Recent Labs Lab 11/20/12 0505 11/20/12 0959 11/24/12 1444  PHART 7.497* 7.466* 7.498*  PCO2ART 42.9 46.8* 38.6  PO2ART 68.3* 250.0* 68.5*  HCO3 32.9* 33.3* 29.7*  TCO2 29.9 30.5 26.4  O2SAT 93.9 99.4 93.7    Recent Labs Lab 11/24/12 0400 11/25/12 0340 11/26/12 0507  NA 139 139 141  K 3.5 3.7 3.6  CL 103 105 107  CO2 32 30 29  BUN 24* 23 25*  CREATININE 0.76 0.74 0.79  GLUCOSE 162* 130* 112*    Recent Labs Lab 11/23/12 0430 11/24/12 0400 11/25/12 0340  HGB 8.5* 8.7* 8.5*  HCT 27.3* 28.1* 27.1*  WBC 14.4* 15.1* 14.0*  PLT 116* 151 153   Lab Results  Component Value Date   INR 1.82* 11/24/2012   INR 1.82* 11/21/2012   INR 2.00* 11/20/2012    Recent Labs Lab 11/25/12 1819 11/25/12 2016 11/26/12 0025 11/26/12 0344  11/26/12 0745  GLUCAP 122* 115* 120* 121* 95   CXR: 5/21 -persistent vasc congestion.  Edema, ATX at bases   ASSESSMENT / PLAN:  PULMONARY A:  Acute post operative respiratory failure.   - S/p extubation on 5/18.  Pos I/O still. P:   -continue Aggressive pulmonary hygiene -Continue PT and OT -OOB twice daily to chair -Continue diuresis   CARDIOVASCULAR A: Sepsis / septic shock secondary to peritonitis.-->resolved - off pressors, last echo in 2007 showed EF of 55-65% with some mitral valve calcification.  P:  - Diuresis as above   RENAL A:  AKI - resolved  P:   - Diuresis as ordered - F/u BMP - Follow I&O   GASTROINTESTINAL A:  Alcoholic  cirrhosis and Acute alcoholic hepatitis.   Transaminitis - improved Perforated viscus in jejunem s/p repair 5/8.  Protein malnutrition  P:   - Stop TPN  - re-consult nutrition for dietary requirements  - Protonix for GI Px. - Wound care per CCS  HEMATOLOGIC A:  Coagulopathy - presume r/t liver disease. Significant intraoperative bleeding controlled with platelets and plasma. INR stable, PLT improved to 151       Post-op anemia: hgb holding s/p transfusions P:  - f/u CBC intermittently  INFECTIOUS A:   Peritonitis.  PCN / Cephalosporin allergy.   P:   - monitor off Abx  ENDOCRINE  A:  Hyperglycemia. Hypothyroidism. P:   - SSI prn - Continue levothyroxine  NEUROLOGIC A:  Acute encephalopathy - ammonia remains normal; much improved H/o Alcohol abuse  P:   - Supportive care - OOB, PT and OT - Thiamine / Folate. - PRN fentanyl for pain - Avoid benzo's - continue qhs seroquel for now   [name] [name] continues to improve following her septic shock. Will plan to diurese today due to edema and overall positive fluid balance. Will continue OOB to chair and aggressive pulmonary toilet.  --------------------------------------------------------------------------------------------------------------- Reviewed above, examined pt, and agree with assessment/plan.  Mental status much improved.  Tolerating diet >> will need to work more with speech therapy.  Will d/c TPN.  Still debilitated >> continue working with PT/OT, and will need SNF placement; likely ready next week.  Still has decreased voice quality after extubation, but slowly improving >> defer ENT evaluation for now.  Will need outpt follow up with [facility] for her cirrhosis.  Will transfer to non tele floor bed.  She is followed by Dr. Birdie Sons as outpt.  Will ask [facility] to assume care 5/23 and PCCM sign off.  Updated family at bedside about plan.  Coralyn Helling, MD [facility] Pulmonary/Critical Care 11/26/2012, 10:39  AM Pager:  [phone] After 3pm call: [phone]

## 2012-11-26 NOTE — Evaluation (Signed)
Clinical/Bedside Swallow Evaluation Patient Details  Name: [name] [name] MRN: [id number] Date of Birth: 05-17-1946  Today's Date: 11/26/2012 Time: 4098-1191 SLP Time Calculation (min): 15 min  Past Medical History:  Past Medical History  Diagnosis Date  . Hypertension   . H/O brain surgery   . Aneurysm of anterior cerebral artery   . Hernia   . Anxiety   . Stroke   . Anemia   . Arthritis    Past Surgical History:  Past Surgical History  Procedure Laterality Date  . Laparotomy  01/22/2012    Procedure: EXPLORATORY LAPAROTOMY;  Surgeon: Emelia Loron, MD;  Location: [facility] OR;  Service: General;  Laterality: N/A;  lysis of adhesions  . Hernia repair    . Esophagogastroduodenoscopy N/A 09/19/2012    Procedure: ESOPHAGOGASTRODUODENOSCOPY (EGD);  Surgeon: Charna Marlon, MD;  Location: [facility] ENDOSCOPY;  Service: Endoscopy;  Laterality: N/A;  . Colonoscopy N/A 09/20/2012    Procedure: COLONOSCOPY;  Surgeon: Charna Addilyn, MD;  Location: [facility] ENDOSCOPY;  Service: Endoscopy;  Laterality: N/A;  . Givens capsule study N/A 09/21/2012    Procedure: GIVENS CAPSULE STUDY;  Surgeon: Charna Lusine, MD;  Location: [facility] ENDOSCOPY;  Service: Endoscopy;  Laterality: N/A;  . Enteroscopy N/A 09/28/2012    Procedure: ENTEROSCOPY;  Surgeon: Rachael Fee, MD;  Location: [facility] ENDOSCOPY;  Service: Endoscopy;  Laterality: N/A;  . Upper gastrointestinal endoscopy  10/05/12  . Enteroscopy N/A 10/05/2012    Procedure: ENTEROSCOPY;  Surgeon: Meryl Dare, MD;  Location: [facility] ENDOSCOPY;  Service: Endoscopy;  Laterality: N/A;  . Laparotomy N/A 11/12/2012    Procedure: EXPLORATORY LAPAROTOMY  with repair perforated small bowel, abdominal wall debridement, application of wound vac dressing;  Surgeon: Ernestene Mention, MD;  Location: [facility] ORS;  Service: General;  Laterality: N/A;  . Wound debridement  11/14/2012    Procedure: Washout Abdominal Wound, debridement of Fascia, Partial Closure;  Surgeon: Ardeth Sportsman, MD;  Location:  [facility] ORS;  Service: General;;   HPI:  67 yo female adm to [facility] with perforation of jejunum s/p repair 11/12/12.  Pt PMH for alcohol hepatitis, ileus, ARF, alcohol abuse - ETOH w/d, anemia, CVA 08, dyspnea, cough-wheezing, ARF, asp pna, divericulosis, brain surgery, and HTN.  Pt is s/p bronch 5/16, required intubation 5/8-5/15, 5/16-5/18.  Order received for swallow evaluation which was completed on 5/19 with rec for ice only (pt had NG in place and was easily dyspneic), 5/20 repeated with rec for puree/necker.  Order was then cancelled by MD and pt was placed on puree/thin.  Reorder received today. Diet was advanced today to dys2/nectar by surgical team.     Assessment / Plan / Recommendation Clinical Impression  Pt today presents improved swallow ability compared to previous evaluation on 11/24/12.   Pt able to phonate today 50% of opportunities (1-2 words) with total cues - improvement.  She was disoriented to place and situation.    Pt self feeding with manual assist to reach mouth due to weakness.  Overt aspiration noted with thin liquids characterized by STRONG cough immediately after the swallow - suspect premature spillage into trachea.  Oral delays in transiting noted due to weakness and cognition.  Delayed pharyngeal swallow initiation suspected but improved.  Pt did not have increased work of breathing today with "snack" of icecream, graham cracker and 2 ounces juice, single bolus of water and ice.    Agree with diet progression to dys2/nectar and ice chips allowed with strict precautions.  SLP to follow for tolerance,  readiness for dietary advancement and determination if instrumental evaluation indicated.  Hopeful for dietary advancement determined clinically with continued medical, phonatory and cognitive improvement.    Per RN today, pt's spouse states pt has not been the same since her brain surgery (uncertain when, kind of brain sx) .    Skilled SLP intervention included educating pt to  timing of phonation with respiration, aspiration precautions, dietary recommendations and findings.  Used teach back for aspiration precautions.    Pt may benefit from cognitive linguistic evaluation if MD/family desires.  No family present today to establish baseline.     Aspiration Risk    Moderate   Diet Recommendation Ice chips PRN after oral care;Dysphagia 2 (Fine chop);Nectar-thick liquid   Liquid Administration via: Cup;Straw Medication Administration: Whole meds with puree Supervision: Full supervision/cueing for compensatory strategies Compensations: Slow rate;Small sips/bites;Check for pocketing Postural Changes and/or Swallow Maneuvers: Seated upright 90 degrees;Upright 30-60 min after meal    Other  Recommendations Oral Care Recommendations: Oral care QID   Follow Up Recommendations   TBD    Frequency and Duration min 2x/week  2 weeks   Pertinent Vitals/Pain Afebrile, decreased    SLP Swallow Goals Swallow Study Goal #2 - Progress: Progressing toward goal Swallow Study Goal #3 - Progress: Progressing toward goal   Swallow Study Prior Functional Status   unknown    General Date of Onset: 11/16/12 HPI: 67 yo female adm to [facility] with perforation of jejunum s/p repair 11/12/12.  Pt PMH for alcohol hepatitis, ileus, ARF, alcohol abuse - ETOH w/d, anemia, CVA 08, dyspnea, cough-wheezing, ARF, asp pna, divericulosis, brain surgery, and HTN.  Pt is s/p bronch 5/16, required intubation 5/8-5/15, 5/16-5/18.  Order received for swallow evaluation which was completed on 5/19 with rec for ice only (pt had NG in place and was easily dyspneic), 5/20 repeated with rec for puree/necker.  Order was then cancelled by MD and pt was placed on puree/thin.  Reorder received today. Diet was advanced today to dys2/nectar by surgical team.   Type of Study: Bedside swallow evaluation Previous Swallow Assessment: Bedside evaluation 11/23/2012 rec NPO except ice chips, Reeval 5/20 puree/nectar Diet  Prior to this Study: Dysphagia 2 (chopped);Nectar-thick liquids Respiratory Status: Supplemental O2 delivered via (comment) History of Recent Intubation: Yes Length of Intubations (days):  (intubated twice, last extubation 5/18) Date extubated: 11/22/12 Behavior/Cognition: Alert;Distractible;Decreased sustained attention;Requires cueing;Other (comment) (delayed responses) Oral Cavity - Dentition: Adequate natural dentition Self-Feeding Abilities: Needs assist Patient Positioning: Upright in bed Baseline Vocal Quality: Hoarse;Low vocal intensity;Breathy (improved phonation today, able to phonate approx  50% opport) Volitional Cough: Cognitively unable to elicit (pt declined to conduct secondary to pain) Volitional Swallow: Unable to elicit    Oral/Motor/Sensory Function Overall Oral Motor/Sensory Function: Impaired (generalized weakness)   Ice Chips Ice chips: Within functional limits Pharyngeal Phase Impairments: Suspected delayed Swallow (? minimal delay)   Thin Liquid Thin Liquid: Impaired Presentation: Self Fed;Cup (hand over hand assist) Oral Phase Impairments: Impaired anterior to posterior transit Oral Phase Functional Implications: Prolonged oral transit Pharyngeal  Phase Impairments: Suspected delayed Swallow;Cough - Immediate Other Comments: overt cough immediately, + aspiration    Nectar Thick Nectar Thick Liquid: Impaired Presentation: Self Fed;Straw Oral Phase Impairments: Reduced lingual movement/coordination;Impaired anterior to posterior transit Oral phase functional implications: Prolonged oral transit Pharyngeal Phase Impairments: Suspected delayed Swallow   Honey Thick Honey Thick Liquid: Not tested   Puree Puree: Within functional limits Presentation: Self Fed;Spoon Oral Phase Impairments: Reduced labial seal;Impaired anterior to posterior  transit Oral Phase Functional Implications: Prolonged oral transit Pharyngeal Phase Impairments: Suspected delayed Swallow    Solid   GO    Solid: Impaired Oral Phase Impairments: Impaired anterior to posterior transit;Reduced lingual movement/coordination Oral Phase Functional Implications: Oral residue Pharyngeal Phase Impairments: Suspected delayed Swallow Other Comments: weakness results in slow, delayed mastication, no increased work of breathing today with intake, delayed swallow and cognitive deficits however will increase pt's aspiration risk of solids, agree with dys2 for now      Donavan Burnet, MS [facility] SLP [phone]

## 2012-11-26 NOTE — Clinical Social Work Placement (Signed)
Clinical Social Work Department CLINICAL SOCIAL WORK PLACEMENT NOTE 11/26/2012  Patient:  [name] [name], [name] [name]  Account Number:  [id number] Admit date:  11/11/2012  Clinical Social Worker:  Jodelle Red  Date/time:  11/26/2012 11:48 AM  Clinical Social Work is seeking post-discharge placement for this patient at the following level of care:   SKILLED NURSING   (*CSW will update this form in Epic as items are completed)   11/25/2012  Patient/family provided with [facility] Department of Clinical Social Work's list of facilities offering this level of care within the geographic area requested by the patient (or if unable, by the patient's family).  11/25/2012  Patient/family informed of their freedom to choose among providers that offer the needed level of care, that participate in Medicare, Medicaid or managed care program needed by the patient, have an available bed and are willing to accept the patient.  11/25/2012  Patient/family informed of [facility]' ownership interest in [facility], as well as of the fact that they are under no obligation to receive care at this facility.  PASARR submitted to EDS on 11/25/2012 PASARR number received from EDS on 11/25/2012  FL2 transmitted to all facilities in geographic area requested by pt/family on  11/26/2012 FL2 transmitted to all facilities within larger geographic area on   Patient informed that his/her managed care company has contracts with or will negotiate with  certain facilities, including the following:     Patient/family informed of bed offers received:   Patient chooses bed at  Physician recommends and patient chooses bed at    Patient to be transferred to  on   Patient to be transferred to facility by   The following physician request were entered in Epic:   Additional Comments: Pt and husband prefer [facility] [place]en [place], [place] ICU/Stepdown Clinical Social Worker [facility] Cell [phone] Hours 8am-1200pm M-F

## 2012-11-26 NOTE — Progress Notes (Signed)
NUTRITION FOLLOW UP  Intervention:   Tube feedings have been discontinued TPN to be discontinued tonight Provide [employer] Instant breakfast once daily Provide [employer] once daily [employer] Cup once daily Continue Multivitamin with minerals  Nutrition Dx:   Inadequate oral intake related to inability to eat as evidenced by NPO status, vent status, and perforated bowel; ongoing/improving- pt off Vent but, diet advanced to dysphagia 2, appetite and PO intake are improving   Goal:   Pt to meet >/= 90% of their estimated nutrition needs; being met   Monitor:   Weight; 11 lb wt increase from 5/16 to 5/22 SLP recommendations; dysphagia 2 with nectar thick liquids Labs; low albumin, low hemoglobin   Assessment:   RD re-consulted due to pt's poor PO intake. Pt is awake and alert, tube feedings have been discontinued, diet is advanced to dysphagia 2 with nectar thick liquids, and TPN is to be discontinued tonight. Pt reports that her appetite is improving, she ate most of her breakfast this morning, and she is doing well on the dysphagia 2 diet. Pt reports that she tried a chocolate Ensure supplement but, didn't like it. Pt confirms that her usual body weight is 215 lbs and that 1 week PTA she was eating poorly but, usually she has a very good appetite and eats well. Expect that appetite will continue to improve with time but, will provide supplements until pt is able to eat 100% of 3 meals daily.  Height: Ht Readings from Last 1 Encounters:  11/12/12 5\' 5"  (1.651 m)    Weight Status:   Wt Readings from Last 1 Encounters:  11/26/12 225 lb 8.5 oz (102.3 kg)    Re-estimated needs:  Kcal: 4098-1191 Protein: 102-122 grams Fluid: 2.7 L  Skin: +1 Generalized edema, abdominal incision  Diet Order: Dysphagia   Intake/Output Summary (Last 24 hours) at 11/26/12 1122 Last data filed at 11/26/12 1100  Gross per 24 hour  Intake   1520 ml  Output   1125 ml  Net    395 ml     Last BM: 5/21   Labs:   Recent Labs Lab 11/21/12 0440  11/23/12 0430 11/24/12 0400 11/25/12 0340 11/26/12 0507  NA 140  < > 139 139 139 141  K 3.2*  < > 3.5 3.5 3.7 3.6  CL 105  < > 104 103 105 107  CO2 32  < > 30 32 30 29  BUN 27*  < > 25* 24* 23 25*  CREATININE 0.89  < > 0.80 0.76 0.74 0.79  CALCIUM 8.0*  < > 7.9* 7.7* 7.8* 7.8*  MG 2.0  --  1.8 2.1 2.2 2.2  PHOS 3.6  --  3.2  --   --  3.8  GLUCOSE 144*  < > 157* 162* 130* 112*  < > = values in this interval not displayed.  CBG (last 3)   Recent Labs  11/26/12 0025 11/26/12 0344 11/26/12 0745  GLUCAP 120* 121* 95    Scheduled Meds: . albuterol  2.5 mg Nebulization Q6H  . antiseptic oral rinse  15 mL Mouth Rinse Q4H  . folic acid  1 mg Oral Daily  . furosemide  40 mg Intravenous Q8H  . insulin aspart  0-20 Units Subcutaneous TID AC & HS  . labetalol  100 mg Oral TID  . levothyroxine  25 mcg Oral QAC breakfast  . lip balm  1 application Topical BID  . multivitamin with minerals  1 tablet Oral Daily  .  pantoprazole  40 mg Oral Daily  . QUEtiapine  25 mg Oral QHS  . sodium chloride  10-40 mL Intracatheter Q12H  . thiamine  100 mg Oral Daily    Continuous Infusions: . sodium chloride 20 mL/hr at 11/21/12 0133    Ian Malkin RD, LDN Inpatient Clinical Dietitian Pager: [phone] After Hours Pager: [phone]

## 2012-11-26 NOTE — Progress Notes (Signed)
12 Days Post-Op   Assessment: s/p Procedure(s): Washout Abdominal Wound, debridement of Fascia, Partial Closure Patient Active Problem List   Diagnosis Date Noted  . Delirium 11/24/2012  . Acute respiratory failure 11/13/2012  . Septic shock(785.52) 11/13/2012  . Aspiration pneumonia 11/12/2012  . Dyspnea 11/12/2012  . Cough 11/12/2012  . Wheezing 11/12/2012  . Hypoglycemia 11/12/2012  . Transaminitis 11/12/2012  . Alcoholic hepatitis 11/12/2012  . Normocytic anemia 11/12/2012  . Ileus 11/12/2012  . Perforation of jejunum s/p primary repair 11/12/2012 11/12/2012  . Nausea & vomiting 11/11/2012  . Abdominal pain, generalized 11/11/2012  . Obesity 11/11/2012  . ARF (acute renal failure) 11/11/2012  . 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 Coagulopathy 11/11/2012  . Hyperlipidemia 11/11/2012  . Anxiety 11/11/2012  . Alcohol withdrawal 11/11/2012  . Increased bilirubin level 11/11/2012  . Gastrointestinal hemorrhage 09/25/2012  . Anemia 09/25/2012  . Alcohol abuse 09/18/2012  . Hyponatremia 09/18/2012  . Thrombocytopenia 09/18/2012  . Elevated INR 09/18/2012  . Anemia due to blood loss, acute 01/24/2012  . HYPERGLYCEMIA 10/14/2008  . DIVERTICULOSIS OF COLON 07/20/2007  . HYPERTENSION 01/30/2007  . 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 CEREBROVASCULAR ACCIDENT, HX OF 01/30/2007    Progressing daily  Plan: Advance diet Can trasfer to tele today.   Subjective: No c/o would like more to eat  Objective: Vital signs in last 24 hours: Temp:  [98 F (36.7 C)-99.2 F (37.3 C)] 98.6 F (37 C) (05/22 0400) Pulse Rate:  [79-92] 85 (05/22 0600) Resp:  [19-27] 26 (05/22 0600) BP: (88-130)/(41-69) 104/41 mmHg (05/22 0600) SpO2:  [94 %-99 %] 97 % (05/22 0600) Weight:  [225 lb 8.5 oz (102.3 kg)] 225 lb 8.5 oz (102.3 kg) (05/22 0500)   Intake/Output from previous day: 05/21 0701 - 05/22 0700 In: 1410 [P.O.:30; I.V.:460; TPN:920] Out: 1125  [Urine:1125]  General appearance: alert, cooperative, fatigued and no distress GI: Still somewhat distended, but no change. Soft, not tender. BS+  Incision: Dressing just changed by RN>reports in clean healthy granulations  Lab Results:   Recent Labs  11/24/12 0400 11/25/12 0340  WBC 15.1* 14.0*  HGB 8.7* 8.5*  HCT 28.1* 27.1*  PLT 151 153   BMET  Recent Labs  11/25/12 0340 11/26/12 0507  NA 139 141  K 3.7 3.6  CL 105 107  CO2 30 29  GLUCOSE 130* 112*  BUN 23 25*  CREATININE 0.74 0.79  CALCIUM 7.8* 7.8*    [facility], Scheduled . albuterol  2.5 mg Nebulization Q6H  . antiseptic oral rinse  15 mL Mouth Rinse QID  . chlorhexidine  15 mL Mouth Rinse BID  . feeding supplement  30 mL Oral Daily  . folic acid  1 mg Oral Daily  . insulin aspart  0-20 Units Subcutaneous Q4H  . labetalol  100 mg Oral TID  . levothyroxine  25 mcg Oral QAC breakfast  . lip balm  1 application Topical BID  . multivitamin with minerals  1 tablet Oral Daily  . pantoprazole  40 mg Oral Daily  . QUEtiapine  25 mg Oral QHS  . sodium chloride  10-40 mL Intracatheter Q12H  . thiamine  100 mg Oral Daily    Studies/Results: Dg Chest Port 1 View  11/25/2012   *RADIOLOGY REPORT*  Clinical Data: Shortness of breath.  PORTABLE CHEST - 1 VIEW  Comparison: 11/24/2012.  Findings: The right PICC line is stable.  The NG tube has been removed.  Persistent low lung volumes with  vascular crowding, atelectasis, vascular congestion and small effusions.  IMPRESSION:  1.  Removal of NG tube. 2.  Persistent vascular congestion, edema, atelectasis and effusions.   Original Report Authenticated By: Rudie Meyer, M.D.      LOS: 15 days     Currie Paris, MD, [facility], [place] [phone]   11/26/2012 7:30 AM

## 2012-11-26 NOTE — Progress Notes (Signed)
PARENTERAL NUTRITION CONSULT NOTE   Pharmacy Consult for TPN Indication: Intolerance to enteral feeding, s/p repair of bowel perforation  Labs:  Recent Labs  11/24/12 0400 11/25/12 0340  WBC 15.1* 14.0*  HGB 8.7* 8.5*  HCT 28.1* 27.1*  PLT 151 153  INR 1.82*  --      Recent Labs  11/24/12 0400 11/25/12 0340 11/26/12 0507  NA 139 139 141  K 3.5 3.7 3.6  CL 103 105 107  CO2 32 30 29  GLUCOSE 162* 130* 112*  BUN 24* 23 25*  CREATININE 0.76 0.74 0.79  CALCIUM 7.7* 7.8* 7.8*  MG 2.1 2.2 2.2  PHOS  --   --  3.8  PROT  --   --  6.3  ALBUMIN  --   --  1.2*  AST  --   --  75*  ALT  --   --  29  ALKPHOS  --   --  160*  BILITOT  --   --  3.2*   Estimated Creatinine Clearance: 84.2 ml/min (by C-G formula based on Cr of 0.79).    Recent Labs  11/25/12 2016 11/26/12 0025 11/26/12 0344  GLUCAP 115* 120* 121*    Nutritional Goals:  Kcal: 1950-2140  Protein: 97-117 grams RD Recommendation 5/19: Recommend discontinuing Resource Beneprotein if TF are increased.  Since TF are d/c and pt is on PO, will need to be re-evaluated.  Current nutrition:   Diet: Dysphagia 1 diet start 5/21, increased to Dysphagia 2 on 5/22  [facility]F: [facility] at [facility]  CBGs & Insulin requirements past 24 hours:   CBGs: controlled, < 150  Resistant SSI: 6 units/ 24hrs  Insulin 25 units/L Bag  Assessment:   67 yo F admit 5/7 with abdominal pain and on 5/8 went to OR for exp laparatomy with repair of perforated small bowel and abdominal wall debridement.  Due to diffuse peritonitis, wound closure was delayed; pt was returned to OR on 5/10 for peritoneal washout, debridement, abdominal wall closure.  Re-intubated 5/16 with worsening pulmonary process, extubated 5/18  Tube feeds were started 5/10, but d/c on 5/12 d/t high residuals ( ). TF resumed 5/17 and patient tolerating at 68ml/hr then d/c'd 5/20 to start PO  Prealbumin recheck reveals severely depleted protein stores.   Repeat bedside  swallow eval 5/20 rec D1 diet.   Labs:   Electrolytes: Ca = 7.8 but corrects to 10, other electrolytes WNL  Renal function: Scr has improved to baseline  Hepatic function: AST/ALT are elevated, but improved.  Alk phos is elevated/stable . Hx of alcoholic cirrhosis noted, monitor trend.   Pre-Albumin: 6.9 (5/13), 4.4(5/19)  TG/Cholesterol: wnl (5/13), 77 (5/19)  Glucose:  Controlled, at goal 150mg /dl, expect improvement from TF being stopped for start of diet with no PO intake.   Plan:   D/C Clinimix after today's bag, reduce rate by 1/2 for last 2 hours. Continue PO multivitamin daily. Continue IVF at 10-20 ml/hr. Continue resistant SSI q4h. D/C TNA lab panels  Lynann Beaver PharmD, BCPS Pager [phone] 11/26/2012 9:15 AM

## 2012-11-26 NOTE — Progress Notes (Signed)
  [id number]/Austyn Seier, RN, BSN, CCM:  CHART REVIEWED AND UPDATED.  Next chart review due on [id number]. NO DISCHARGE NEEDS PRESENT AT THIS TIME. CASE MANAGEMENT [phone] 

## 2012-11-27 ENCOUNTER — Inpatient Hospital Stay (HOSPITAL_COMMUNITY): Payer: BC Managed Care – PPO

## 2012-11-27 DIAGNOSIS — Z7401 Bed confinement status: Secondary | ICD-10-CM

## 2012-11-27 LAB — BASIC METABOLIC PANEL
CO2: 28 mEq/L (ref 19–32)
Calcium: 7.4 mg/dL — ABNORMAL LOW (ref 8.4–10.5)
Chloride: 106 mEq/L (ref 96–112)
Glucose, Bld: 100 mg/dL — ABNORMAL HIGH (ref 70–99)
Potassium: 3.5 mEq/L (ref 3.5–5.1)
Sodium: 141 mEq/L (ref 135–145)

## 2012-11-27 LAB — CBC
Hemoglobin: 8.1 g/dL — ABNORMAL LOW (ref 12.0–15.0)
MCH: 30.6 pg (ref 26.0–34.0)
MCV: 94.7 fL (ref 78.0–100.0)
Platelets: 177 10*3/uL (ref 150–400)
RBC: 2.65 MIL/uL — ABNORMAL LOW (ref 3.87–5.11)
WBC: 8.2 10*3/uL (ref 4.0–10.5)

## 2012-11-27 LAB — GLUCOSE, CAPILLARY
Glucose-Capillary: 137 mg/dL — ABNORMAL HIGH (ref 70–99)
Glucose-Capillary: 94 mg/dL (ref 70–99)

## 2012-11-27 LAB — CLOSTRIDIUM DIFFICILE BY PCR: Toxigenic C. Difficile by PCR: NEGATIVE

## 2012-11-27 MED ORDER — ENOXAPARIN SODIUM 40 MG/0.4ML ~~LOC~~ SOLN
40.0000 mg | SUBCUTANEOUS | Status: DC
  Administered 2012-11-27 – 2012-12-03 (×7): 40 mg via SUBCUTANEOUS
  Filled 2012-11-27 (×7): qty 0.4

## 2012-11-27 MED ORDER — FUROSEMIDE 10 MG/ML IJ SOLN: 20.0000 mg | Freq: Once | INTRAMUSCULAR | Status: AC

## 2012-11-27 MED ORDER — FUROSEMIDE 10 MG/ML IJ SOLN
INTRAMUSCULAR | Status: AC
  Administered 2012-11-27: 20 mg
  Filled 2012-11-27: qty 4

## 2012-11-27 NOTE — Progress Notes (Signed)
VASCULAR LAB PRELIMINARY  PRELIMINARY  PRELIMINARY  PRELIMINARY  Bilateral lower extremity venous duplex  completed.    Preliminary report:  Bilateral:  No evidence of DVT, superficial thrombosis, or Baker's Cyst.    Anabella Capshaw, RVT 11/27/2012, 12:00 PM

## 2012-11-27 NOTE — Care Management Note (Addendum)
    Page 1 of 2   12/01/2012     4:57:03 PM   CARE MANAGEMENT NOTE 12/01/2012  Patient:  [name] [name], [name] [name]   Account Number:  [id number]  Date Initiated:  11/13/2012  Documentation initiated by:  DAVIS,RHONDA  Subjective/Objective Assessment:   pt with perforated bowel, requiring surgical intervention, unable to support resp on vent post op     Action/Plan:   tbd based on progress   Anticipated DC Date:  12/04/2012   Anticipated DC Plan:  SKILLED NURSING FACILITY  In-house referral  Clinical Social Worker      DC Planning Services  CM consult      [facility] Choice  NA   Choice offered to / List presented to:  NA   DME arranged  NA      DME agency  NA     HH arranged  NA      HH agency  NA   Status of service:  In process, will continue to follow Medicare Important Message given?  NA - LOS <3 / Initial given by admissions (If response is "NO", the following Medicare IM given date fields will be blank) Date Medicare IM given:   Date Additional Medicare IM given:    Discharge Disposition:    Per UR Regulation:  Reviewed for med. necessity/level of care/duration of stay  If discussed at Long Length of Stay Meetings, dates discussed:   11/17/2012  11/26/2012    Comments:  12/01/12 Demiyah Fischbach RN,BSN NCM [phone] POD#17,BARIUM SWALLOW TODAY,CALORIE COUNT,D/C PLAN SNF WHEN MED STABLE.  11/27/12 Lacrisha Bielicki RN,BSN NCM [phone] TRANSFER FROM SDU.POD#13 WASHOUT ABD WOUND DEBRIDEMENT OF FASCIA,PARTIAL CLOSURE.ST-DYSPHAGIA 2 THICK NECTAR DIET,MULTIPLE LOOSE STOOLS.D/C SNF WHEN MED STABLE.  [id number]/WUXLKG Earlene Plater, RN, BSN, CCM:  CHART REVIEWED AND UPDATED.  Discharge planning for snf placement began.  Patient fl2 has been faxed out by the csw. Next chart review due on [id number]. NO DISCHARGE NEEDS PRESENT AT THIS TIME. CASE MANAGEMENT [phone]  [id number]/FIEPPI Earlene Plater, RN, BSN, CCM:  CHART REVIEWED AND UPDATED.  pt was extubated again on [id number] to [place] o2, off iv  pressors, started on t.f. remains on tpn with lipids, abd wound remains open. Next chart review due on [id number]. NO DISCHARGE NEEDS PRESENT AT THIS TIME. CASE MANAGEMENT [phone]   [id number]/CWCBJS Davis,Rn,BSN,CCM: Patient was extubated pm of [id number], resp distress reoccuring am of [id number], pt had bronch and washings done and then reintubuted and back on vent per husband request. No trach at this time.  [id number]/OEVOJJ Earlene Plater, RN, BSN, CCM:  CHART REVIEWED AND UPDATED.  Next chart review due on [id number]. NO DISCHARGE NEEDS PRESENT AT THIS TIME. pt remains on the vent at full support, open abd wound and vac. CASE MANAGEMENT [phone]   [id number]/ZWCHEN Davis,RN,BSN,CCM: Patient returned to or on [id number], post-op resp failure, open abd wound, on vent-intubated. [id number]/[id number] Earlene Plater, RN, BSN, CCM:  CHART REVIEWED AND UPDATED.  Next chart review due on [id number]. NO DISCHARGE NEEDS PRESENT AT THIS TIME. CASE MANAGEMENT [phone]

## 2012-11-27 NOTE — Progress Notes (Signed)
Occupational Therapy Treatment Patient Details Name: [name] [name] MRN: [id number] DOB: 03-17-1946 Today's Date: 11/27/2012 Time: 4098-1191 OT Time Calculation (min): 23 min  OT Assessment / Plan / Recommendation Comments on Treatment Session Pt awake and alert, sitting eob when I arrived.  Pt needs cues for safety and bed alarm was set.  Pt able to stand with A x 2 but unable to stejp    Follow Up Recommendations  SNF    Barriers to Discharge       Equipment Recommendations  3 in 1 bedside comode    Recommendations for Other Services    Frequency Min 2X/week   Plan Discharge plan needs to be updated    Precautions / Restrictions Precautions Precautions: Fall Precaution Comments: abd surgery, vac, abd binder when up. Restrictions Weight Bearing Restrictions: No   Pertinent Vitals/Pain At end of session, pt c/o back pain.  Repositioned and requested pain medication    ADL  Grooming: Brushing hair;Maximal assistance (for thoroughness) Where Assessed - Grooming: Unsupported sitting Transfers/Ambulation Related to ADLs: pt was sitting eob when I arrived: she had done this without help.  NT had just finished helping clean her up. ADL Comments: Performed grooming and A/AAROM to bil UEs. Pt able to lift RUE about 60 degrees actively and LUE 90 but fatiques easily with progressively less ROM.  Cued to rest then assisted to support weight of arms.    Pt wanted to get to chair.  NT came to assist.  Pt able to stand but could not step even with assist to weight shift.      OT Diagnosis:    OT Problem List:   OT Treatment Interventions:     OT Goals Acute Rehab OT Goals Time For Goal Achievement: 12/09/12 ADL Goals Pt Will Perform Eating: with min assist;Sitting, chair;Supported Pt Will Perform Grooming: with min assist;Sitting, chair;Supported ADL Goal: Grooming - Progress: Progressing toward goals Pt Will Perform Upper Body Bathing: with min assist;Sitting, chair Pt Will  Perform Upper Body Dressing: with min assist;Sitting, chair;Supported Miscellaneous OT Goals Miscellaneous OT Goal #1: Pt will perform bed mobility with mod A in preparation for adls/toilet transfers OT Goal: Miscellaneous Goal #1 - Progress: Progressing toward goals Miscellaneous OT Goal #2: Pt will perform 2 sets of 10 shoulder AROM exercises x 2 motions to increase strength for adls. OT Goal: Miscellaneous Goal #2 - Progress: Progressing toward goals Miscellaneous OT Goal #3: Pt will go from sit to stand with A x 2, pt 70% in preparation for toilet transfers OT Goal: Miscellaneous Goal #3 - Progress: Updated due to goal met  Visit Information  Last OT Received On: 11/27/12 Assistance Needed: +2    Subjective Data      Prior Functioning       Cognition  Cognition Arousal/Alertness: Awake/alert Behavior During Therapy: WFL for tasks assessed/performed Overall Cognitive Status: Impaired/Different from baseline Area of Impairment: Safety/judgement;Attention Current Attention Level: Sustained Safety/Judgement: Decreased awareness of safety;Decreased awareness of deficits    Mobility  Bed Mobility Sit to Supine: 2: Max assist;With rail (sit to R sidelying.  Pt did not assist much with legs. cues ) Transfers Sit to Stand: 1: +2 Total assist;From bed;From elevated surface (UEs on RW) Sit to Stand: Patient Percentage: 50% Stand to Sit: 1: +2 Total assist Stand to Sit: Patient Percentage: 50% Details for Transfer Assistance: able to stand but unable to step with assist to weight shift.  Cues for safety;  pt sits quickly    Exercises  Balance Static Sitting Balance Static Sitting - Balance Support: No upper extremity supported Static Sitting - Level of Assistance: 5: Stand by assistance Static Sitting - Comment/# of Minutes: 15 minutes   End of Session OT - End of Session Activity Tolerance: Patient limited by pain Patient left: in bed;with call bell/phone within  reach;with bed alarm set  GO     [name] [name] 11/27/2012, 4:14 PM Marica Otter, OTR/L [phone] 11/27/2012

## 2012-11-27 NOTE — Progress Notes (Signed)
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   Coagulopathy   Hyperlipidemia   Anxiety   Alcohol withdrawal   Increased bilirubin level   Aspiration pneumonia   Dyspnea   Cough   Wheezing   Hypoglycemia   Transaminitis   Alcoholic hepatitis   Normocytic anemia   Ileus   Acute respiratory failure   Septic shock(785.52)   Delirium  WL4  Assumed care on 5/23    Respiratory failure-postoperative Status post extubation on 5/18 Order requirements table echo contSepsis/septic  Sepsis/septic shock secondary to peritonitis Off vasopressors last echo in 2007 showed EF of 55-65% with some mitral valve calcification   Acute kidney injury resolved, continue diuresis   Alcoholic cirrhosis and Acute alcoholic hepatitis.  Transaminitis - improved  Perforated viscus in jejunem s/p repair 5/8.  Protein malnutrition He been discontinued NG tube removed Nutrition consult Wound care per [facility] S. Outpatient GI followup    Coagulopathy/thrombocytopenia platelet count improving  Peritonitis-off antibiotics  Hypothyroidism continue Synthroid  Acute encephalopathy resolved-continue thiamine folate secondary to alcohol abuse history, born benzodiazepines, continue cerebral  Hoarseness after extubation-outpatient ENT followup    Code Status: full Family Communication: family updated about patient's clinical progress Disposition Plan:  Continue PT OT evaluation   BRIEF PATIENT DESCRIPTION: 67 yo with alcoholic liver disease was admitted on 5/7 with abdominal pain and was found on 5/8 to have a small bowel  perforation. She was taken to the OR and returned to the ICU with an open wound and mechanically ventilated.  SIGNIFICANT EVENTS / STUDIES:  5/7 Admitted with abdominal pain, nausea, vomiting  5/7 OR >>> Perforated viscus, peritonitis, lysis of adhesions, small bowel repair  5/16 Reintubated r/t complete left sided collapse, increased WBC  5/18 Extubated, WBC decreased, TF started  5/20 Speech assessment >> D1 diet  5/22 D/c TPN, transfer to non tele floor bed  LINES / TUBES:  OETT 5/8 >>>5/15>>>Reintubated 5/16>>> 5/18  NGT 5/8 >>> 5/20  Foley 5/8 >>>  R rad A-line 5/8 >>>5/12>>>5/16>>>5/18  L IJ CVL 5/8 >>>5/19  R Brach PICC>>>5/19  CULTURES:  5/8 Blood >>>negative  5/7 Urine >>>negative  Blood 5/15>>>neg  Urine 5/15>>>neg  Sputum 5/15>>>neg  Sputum via bronch 5/16>>> Neg  ANTIBIOTICS:  Aztreonam 5/8 >>>5/9  Flagyl 5/8 >>>5/9  Levaquin 5/8 x 1  Cipro 5/8 x 1  ertapenem 5/9>>>5/16  Vanc 5/15 >>>5/19  Primaxin 5/16 >>>5/21  Interval events:  Doing well. No acute complaints this morning   Objective: Filed Vitals:   11/26/12 2130 11/27/12 0121 11/27/12 0350 11/27/12 0619  BP: 98/53  95/51 153/60  Pulse: 92 89 91 57  Temp: 100 F (37.8 C)  99.1 F (37.3 C) 97.3 F (36.3 C)  TempSrc: Oral  Oral Oral  Resp: 24 20 20 16   Height:      Weight:      SpO2: 91% 94% 94% 100%    Intake/Output Summary (Last 24 hours) at 11/27/12 0747 Last data filed at 11/27/12 0346  Gross per 24 hour  Intake    520 ml  Output   1045 ml  Net   -525 ml  Exam: General: Elderly, ill appearing female, lying in bed  HEENT: NCAT, EOM intact, neck supple  PULM: rhonchi bilaterally but decreased, no wheezes  CV: RRR, no mgr  AB: Central wound vac in place, hyperactive bowel sounds. ABD non-tender  Ext: Thin skin with ecchymosis, anasarca improved, SCD's in place.  Neuro: AxO x3 follows commands    Data Reviewed: Basic Metabolic Panel:  Recent Labs Lab 11/21/12 0440   11/23/12 0430 11/24/12 0400 11/25/12 0340 11/26/12 0507 11/27/12 0420  NA 140  < > 139 139 139 141 141  K 3.2*  < > 3.5 3.5 3.7 3.6 3.5  CL 105  < > 104 103 105 107 106  CO2 32  < > 30 32 30 29 28   GLUCOSE 144*  < > 157* 162* 130* 112* 100*  BUN 27*  < > 25* 24* 23 25* 26*  CREATININE 0.89  < > 0.80 0.76 0.74 0.79 1.03  CALCIUM 8.0*  < > 7.9* 7.7* 7.8* 7.8* 7.4*  MG 2.0  --  1.8 2.1 2.2 2.2  --   PHOS 3.6  --  3.2  --   --  3.8  --   < > = values in this interval not displayed.  Liver Function Tests:  Recent Labs Lab 11/22/12 0400 11/23/12 0430 11/26/12 0507  AST 77* 104* 75*  ALT 33 36* 29  ALKPHOS 117 149* 160*  BILITOT 2.9* 3.8* 3.2*  PROT 5.8* 5.9* 6.3  ALBUMIN 1.4* 1.3* 1.2*   No results found for this basename: LIPASE, AMYLASE,  in the last 168 hours  Recent Labs Lab 11/23/12 1200  AMMONIA 32    CBC:  Recent Labs Lab 11/22/12 0400 11/23/12 0430 11/24/12 0400 11/25/12 0340 11/27/12 0420  WBC 13.8* 14.4* 15.1* 14.0* 8.2  NEUTROABS 11.8* 12.4*  --   --   --   HGB 8.4* 8.5* 8.7* 8.5* 8.1*  HCT 27.3* 27.3* 28.1* 27.1* 25.1*  MCV 95.1 94.8 94.3 94.8 94.7  PLT 103* 116* 151 153 177    Cardiac Enzymes: No results found for this basename: CKTOTAL, CKMB, CKMBINDEX, TROPONINI,  in the last 168 hours BNP (last 3 results) No results found for this basename: PROBNP,  in the last 8760 hours   CBG:  Recent Labs Lab 11/26/12 0344 11/26/12 0745 11/26/12 1200 11/26/12 1715 11/26/12 2129  GLUCAP 121* 95 151* 146* 110*    Recent Results (from the past 240 hour(s))  CULTURE, BLOOD (ROUTINE X 2)     Status: None   Collection Time    11/19/12  9:30 AM      Result Value Range Status   Specimen Description BLOOD RIGHT ARM   Final   Special Requests BOTTLES DRAWN AEROBIC AND ANAEROBIC 8CC   Final   Culture  Setup Time 11/19/2012 12:44   Final   Culture NO GROWTH 5 DAYS   Final   Report Status 11/25/2012 FINAL   Final  URINE CULTURE     Status: None    Collection Time    11/19/12  9:35 AM      Result Value Range Status   Specimen Description URINE, CATHETERIZED   Final   Special Requests Normal   Final   Culture  Setup Time 11/19/2012 13:48   Final   Colony Count NO GROWTH   Final   Culture NO GROWTH   Final   Report Status 11/20/2012 FINAL   Final  CULTURE, BLOOD (ROUTINE X 2)     Status: None  Collection Time    11/19/12  9:40 AM      Result Value Range Status   Specimen Description BLOOD RIGHT HAND   Final   Special Requests BOTTLES DRAWN AEROBIC ONLY 2CC   Final   Culture  Setup Time 11/19/2012 12:44   Final   Culture NO GROWTH 5 DAYS   Final   Report Status 11/25/2012 FINAL   Final  CULTURE, RESPIRATORY (NON-EXPECTORATED)     Status: None   Collection Time    11/19/12 10:50 AM      Result Value Range Status   Specimen Description TRACHEAL ASPIRATE   Final   Special Requests NONE   Final   Gram Stain     Final   Value: FEW WBC PRESENT,BOTH PMN AND MONONUCLEAR     NO SQUAMOUS EPITHELIAL CELLS SEEN     NO ORGANISMS SEEN   Culture Non-Pathogenic Oropharyngeal-type Flora Isolated.   Final   Report Status 11/21/2012 FINAL   Final  CULTURE, RESPIRATORY (NON-EXPECTORATED)     Status: None   Collection Time    11/20/12 10:56 AM      Result Value Range Status   Specimen Description TRACHEAL ASPIRATE   Final   Special Requests Normal   Final   Gram Stain     Final   Value: FEW WBC PRESENT,BOTH PMN AND MONONUCLEAR     NO SQUAMOUS EPITHELIAL CELLS SEEN     NO ORGANISMS SEEN   Culture Non-Pathogenic Oropharyngeal-type Flora Isolated.   Final   Report Status 11/22/2012 FINAL   Final     Studies: Abd 1 View (kub)  11/11/2012   *RADIOLOGY REPORT*  Clinical Data: Abdominal pain with nausea and vomiting.  Reduced bowel sounds.  ABDOMEN - 1 VIEW  Comparison: 10/05/2012  Findings: There is a ovoid shaped gas structure in the midline which likely represents stomach.  Inferior and lateral to this there are moderately distended bowel  loops which could represent small bowel or colon.  A similar appearance was noted on the prior exam from 10/05/2012, although the bowel loops are more distended at that time. A linear radiopaque density which was previously located in the right upper quadrant  now projects over the left lower quadrant. A more subtle left lower quadrant radiopaque density located along the left panniculus remains unchanged.  IMPRESSION: Gas-filled mid abdominal loops are not as distended as previously noted, but mildly abnormal. Findings consistent with ileus although early obstruction not excluded.  Previously noted right upper quadrant radiopaque density has transited to the left side of the abdomen.   Original Report Authenticated By: Davonna Belling, M.D.   Ct Abdomen Pelvis W Contrast  11/19/2012   *RADIOLOGY REPORT*  Clinical Data: Abdominal pain, leukocytosis, recent surgery for perforated small bowel  CT ABDOMEN AND PELVIS WITH CONTRAST  Technique:  Multidetector CT imaging of the abdomen and pelvis was performed following the standard protocol during bolus administration of intravenous contrast.  Contrast: OMNIPAQUE IOHEXOL 300 MG/ML  SOLN  Comparison: 09/18/2012  Findings: Motion degraded images.  Mild patchy opacity in the right lower lobe (series 5/image 7), suspicious for pneumonia.  Additional patchy opacity in the left lower lobe (series 5/image 8), suspicious for pneumonia, less likely compressive atelectasis.  Right basilar opacity, likely atelectasis.  Small left and trace right pleural effusions.  Cirrhotic configuration of the liver.  1.9 x 1.5 cm hypoenhancing lesion in the lateral segment left hepatic lobe, previously favored to reflect a benign hemangioma although indeterminate, unchanged from  2013.  Spleen, pancreas, and adrenal glands are within normal limits.  Small layering gallstones (series 2/image 38).  No associated inflammatory changes by CT.  Kidneys are unremarkable.  No renal calculi or  hydronephrosis.  No evidence of bowel obstruction.  No contrast extravasation to suggest leak/perforation following surgery.  Colonic diverticulosis, without associate inflammatory changes.  No free air.  Atherosclerotic calcifications of the abdominal aorta and branch vessels. Shunt/collateral vessel in the left mid abdomen (series 2/image 60), between the portosplenic confluence and the IVC. Portal vein is patent.  Moderate abdominopelvic ascites.  Uterus is mildly heterogeneous, possibly reflecting uterine fibroids.  No adnexal masses.  Bladder decompressed by indwelling Foley catheter.  Postsurgical changes in the midline anterior abdominal wall.  Body wall edema.  Degenerative changes of the visualized thoracolumbar spine.  IMPRESSION: No oral contrast extravasation to suggest leak/perforation.  No free air.  Cirrhosis.  Venous collaterals between the portable at confluence in the IVC.  Portal vein remains patent.  1.9 x 1.5 cm hypoenhancing lesion in the lateral segment left hepatic lobe, indeterminate, unchanged since 2013.  Correlate with serum AFP and consider follow-up MRI abdomen with/without contrast for further characterization as an outpatient.  Moderate abdominopelvic ascites.  Body wall edema.  Suspected right middle lobe and possible left lower lobe pneumonia. Small left and trace right pleural effusions.  Additional ancillary findings as above.   Original Report Authenticated By: Charline Bills, M.D.   Dg Chest Port 1 View  11/25/2012   *RADIOLOGY REPORT*  Clinical Data: Shortness of breath.  PORTABLE CHEST - 1 VIEW  Comparison: 11/24/2012.  Findings: The right PICC line is stable.  The NG tube has been removed.  Persistent low lung volumes with vascular crowding, atelectasis, vascular congestion and small effusions.  IMPRESSION:  1.  Removal of NG tube. 2.  Persistent vascular congestion, edema, atelectasis and effusions.   Original Report Authenticated By: Rudie Meyer, M.D.   Dg Chest  Port 1 View  11/24/2012   *RADIOLOGY REPORT*  Clinical Data: Follow up atelectasis.  PORTABLE CHEST - 1 VIEW  Comparison: 11/23/2012.  Findings: The NG tube is stable.  The left IJ catheter has been removed. A new right PICC line is in good position with the tip near the cavoatrial junction.  The heart lungs appears stable with persistent low lung volumes, vascular crowding, atelectasis and effusions.  IMPRESSION:  1.  Removal of left IJ catheter. 2.  New right-sided PICC line with tip near the cavoatrial junction. 3.  Persistent low lung volumes with vascular crowding, atelectasis, vascular congestion and probable small effusions.   Original Report Authenticated By: Rudie Meyer, M.D.   Dg Chest Port 1 View  11/23/2012   *RADIOLOGY REPORT*  Clinical Data: Status post extubation  PORTABLE CHEST - 1 VIEW  Comparison: 11/22/2012  Findings: The cardiac shadow is stable.  The endotracheal tube has been removed.  The nasogastric catheter and left-sided central venous line remain in place. The lungs are well-aerated again demonstrates some mild bibasilar changes.  IMPRESSION: Interval extubation.  The remainder of the exam is stable from prior study.   Original Report Authenticated By: Alcide Clever, M.D.   Dg Chest Port 1 View  11/22/2012   *RADIOLOGY REPORT*  Clinical Data: Evaluate collapse.  History stroke.  Hypertension.  PORTABLE CHEST - 1 VIEW  Comparison: 11/21/2012  Findings: Endotracheal tube appropriately position with tip 3 cm above carina.  Nasogastric tube looped in stomach with tip below the inferior aspect of film.  Left IJ central line terminates at the cavoatrial junction.  Cardiomegaly accentuated by AP portable technique.  Atherosclerosis in the transverse aorta.  Persistent small left pleural effusion. No pneumothorax.  Low lung volumes and mild interstitial edema, similar.  No change in left greater than right bibasilar airspace disease.  IMPRESSION: No significant change since one day prior.   Bibasilar atelectasis versus infection, greater left than right.  Low lung volumes with mild interstitial edema and persistent small left pleural effusion.  Age advanced aortic atherosclerosis.   Original Report Authenticated By: Jeronimo Greaves, M.D.   Portable Chest Xray In Am  11/21/2012   *RADIOLOGY REPORT*  Clinical Data: New.  PORTABLE CHEST - 1 VIEW  Comparison: 5/16, 5/15, and 11/18/2012  Findings: There is been partial clearing of the infiltrates at both bases.  Heart size and vascularity are normal.  Endotracheal tube and central venous catheter and NG tube are unchanged.  IMPRESSION: Improvement in the bibasilar infiltrates.   Original Report Authenticated By: Francene Boyers, M.D.   Portable Chest Xray  11/20/2012   *RADIOLOGY REPORT*  Clinical Data: Endotracheal tube placement.  PORTABLE CHEST - 1 VIEW  Comparison: 11/20/2012 5:00 a.m. and 11/19/2012.  Findings: Endotracheal tube has been placed with the tip 3.5 cm above the carina.  Improved aeration of the left lung with residual left base atelectasis and / or left-sided pleural effusion. Decrease in degree of midline shift to the left.  Left base infiltrate would be difficult to exclude with the presence of atelectasis.  Left central line tip distal superior vena cava level.  No gross pneumothorax.  Pulmonary vascular congestion/pulmonary edema most notable centrally.  Nasogastric tube courses below the diaphragm.  The tip is not included on this exam.  Heart size difficult to adequately assessed.  Mitral valve calcifications.  Calcification of the aorta.  IMPRESSION: Endotracheal tube has been placed with the tip 3.5 cm above the carina.  Improved aeration of the left lung with residual left base atelectasis and / or left-sided pleural effusion. Decrease in degree of midline shift to the left.  Pulmonary vascular congestion/ pulmonary edema most notable centrally.  This is a call report.   Original Report Authenticated By: Lacy Duverney, M.D.   Dg  Chest Port 1 View  11/20/2012   *RADIOLOGY REPORT*  Clinical Data: Evaluate endotracheal tube placement.  PORTABLE CHEST - 1 VIEW  Comparison: 11/19/2012  Findings: Endotracheal tube has been removed.  There is now complete opacification of the left hemithorax.  Findings are most compatible with volume loss and mucous plugging.  Mediastinal shift towards the left.  Nasogastric tube extends into the abdomen.  Left jugular central line is in the SVC region.  Streaky densities at the right lung base are most compatible atelectasis.  IMPRESSION: Complete opacification of the left hemithorax is most compatible with volume loss and mucous plugging.  These results were called by telephone on 11/20/2012 at 01/1943 and to the patient's nurse, Kennyth Arnold, who verbally acknowledged these results.   Original Report Authenticated By: Richarda Overlie, M.D.   Dg Chest Port 1 View  11/19/2012   *RADIOLOGY REPORT*  Clinical Data: Pulmonary vascular congestion.  Endotracheal tube placement.  PORTABLE CHEST - 1 VIEW  Comparison: 11/18/2012  Findings: Endotracheal tube, central venous catheter, and NG tube all appear in good position.  Pulmonary vascular congestion persists.  Increasing density at the left base is probably an effusion.  Slight atelectasis at the right base is unchanged.  IMPRESSION: Increasing left pleural effusion.  Persistent pulmonary vascular congestion and slight right base atelectasis.   Original Report Authenticated By: Francene Boyers, M.D.   Dg Chest Port 1 View  11/18/2012   *RADIOLOGY REPORT*  Clinical Data: Endotracheal tube placement  PORTABLE CHEST - 1 VIEW  Comparison: 11/17/2012  Findings: 0453 hours.  Endotracheal tube tip is 3.2 cm above the base of the carina.  The left IJ central venous catheter tip overlies the mid to lower SVC.  Lung volumes are low. The cardiopericardial silhouette is enlarged.  Vascular congestion noted without overt airspace pulmonary edema.  There is slight improvement in basilar  lung aeration. The NG tube passes into the stomach although the distal tip position is not included on the film. Telemetry leads overlie the chest.  IMPRESSION: Low volume film with cardiomegaly vascular congestion.  Slight improvement of bibasilar collapse / consolidation.   Original Report Authenticated By: Kennith Center, M.D.   Dg Chest Port 1 View  11/17/2012   *RADIOLOGY REPORT*  Clinical Data: Status post repair of small bowel perforation. Ventilated patient.  PORTABLE CHEST - 1 VIEW  Comparison: Chest 11/16/2012.  Findings: Support tubes and lines are unchanged.  Bibasilar airspace opacities persist and appear worsened on the right.  No pneumothorax identified.  Heart size is normal.  IMPRESSION: Some worsening of airspace disease in the right lung base.  No change on the left.   Original Report Authenticated By: Holley Dexter, M.D.   Dg Chest Port 1 View  11/16/2012   *RADIOLOGY REPORT*  Clinical Data: Endotracheal placement  PORTABLE CHEST - 1 VIEW  Comparison: 11/15/2012  Findings: Endotracheal tube has its tip 2.5 cm above the carina. Nasogastric tube enters the stomach.  Left internal jugular central line has its tip at the SVC/RA junction.  Heart size is stable. Patchy density in both lung bases persist consistent atelectasis and/or pneumonia.  No new finding.  IMPRESSION: Lines and tubes grossly well positioned.  Lower lobe atelectasis and/or pneumonia persists bilaterally.   Original Report Authenticated By: Paulina Fusi, M.D.   Dg Chest Port 1 View  11/15/2012   *RADIOLOGY REPORT*  Clinical Data: Intubated patient.  PORTABLE CHEST - 1 VIEW  Comparison: The tip in the portable chest 11/14/2012.  Findings: Support tubes and lines are unchanged.  There has been some increase in right basilar atelectasis.  Small left effusion and basilar airspace disease are unchanged.  No pneumothorax.  IMPRESSION:  1.  Support apparatus unchanged and in good position. 2.  Increased right basilar atelectasis.  3.  No marked change in a small left effusion and basilar airspace disease.   Original Report Authenticated By: Holley Dexter, M.D.   Dg Chest Port 1 View  11/14/2012   *RADIOLOGY REPORT*  Clinical Data: Intubated patient.  PORTABLE CHEST - 1 VIEW  Comparison: Chest 11/12/2012.  Findings: Support tubes and lines are unchanged.  Left basilar airspace disease persists.  Right lung appears clear.  No pneumothorax.  Heart size upper normal.  IMPRESSION: No change in left basilar airspace disease.  No new abnormality.   Original Report Authenticated By: Holley Dexter, M.D.   Dg Chest Port 1 View  11/12/2012   *RADIOLOGY REPORT*  Clinical Data: Evaluate central line placement.  PORTABLE CHEST - 1 VIEW  Comparison: 11/12/2012  Findings: Endotracheal tube is 3.8 cm above the carina.  Left jugular central line tip is in the lower SVC region.  No evidence for a pneumothorax.  Persistent left basilar densities could represent atelectasis and cannot exclude pleural fluid.  The nasogastric tube extends into the abdomen.  Stable appearance of the heart.  IMPRESSION: Central line tip in the lower SVC region.  Negative for a pneumothorax.  Stable basilar densities.   Original Report Authenticated By: Richarda Overlie, M.D.   Dg Chest Port 1 View  11/12/2012   *RADIOLOGY REPORT*  Clinical Data: Perforated jejunum.  Peritonitis.  PORTABLE CHEST - 1 VIEW 5:34 p.m.  Comparison: 11/12/2012 at 11:35 a.m.  Findings: Endotracheal tube is in good position 3 cm above the carina.  NG tube tip is in the stomach.  Heart size and vascularity are normal.  There is slight atelectasis in the left upper lobe medially.  Scarring at the left base.  Dense calcification in the mitral valve annulus.  IMPRESSION: New atelectasis in the left upper lobe medially.   Original Report Authenticated By: Francene Boyers, M.D.   Dg Chest Port 1 View  11/12/2012   *RADIOLOGY REPORT*  Clinical Data: Cough and shortness of breath.  Fever.  Nausea and vomiting.   PORTABLE CHEST - 1 VIEW  Comparison: Chest x-rays dated 10/05/2012 and 01/20/2012  Findings: There is suggestion of free air under the right hemidiaphragm.  I recommend a left lateral decubitus view of the abdomen for further evaluation of this possibility.  There is chronic cardiomegaly with scarring at the left lung base laterally.  Dense calcification in the mitral valve annulus.  There is slight pulmonary vascular congestion.  No infiltrates.  IMPRESSION:  1.  Possible free air under the right hemidiaphragm.  Left lateral decubitus view of the abdomen recommended for further evaluation. 2.  Pulmonary vascular congestion. 3.  No acute infiltrates.  Critical Value/emergent results were called by telephone at the time of interpretation on 11/12/2012 at 11:48 a.m. to Mercy Moore, RN, who verbally acknowledged these results.   Original Report Authenticated By: Francene Boyers, M.D.   Dg Abd Decub  11/12/2012   *RADIOLOGY REPORT*  Clinical Data: Abdominal pain and nausea.  Question free intraperitoneal air.  ABDOMEN - 1 VIEW DECUBITUS  Comparison: Plain films abdomen 11/11/2012.  Findings: There is free intraperitoneal air.  IMPRESSION: Study is positive for free intraperitoneal air consistent with bowel perforation.  Critical Value/emergent results were called by telephone at the time of interpretation on 11/12/2012 at 12:53 p.m. to the patient's nurse, Bernette Redbird, who verbally acknowledged these results.   Original Report Authenticated By: Holley Dexter, M.D.    Scheduled Meds: . albuterol  2.5 mg Nebulization Q6H  . antiseptic oral rinse  15 mL Mouth Rinse Q4H  . feeding supplement  1 Container Oral Daily  . folic acid  1 mg Oral Daily  . insulin aspart  0-20 Units Subcutaneous TID AC & HS  . labetalol  100 mg Oral TID  . levothyroxine  25 mcg Oral QAC breakfast  . lip balm  1 application Topical BID  . multivitamin with minerals  1 tablet Oral Daily  . pantoprazole  40 mg Oral Daily  . QUEtiapine  25  mg Oral QHS  . sodium chloride  10-40 mL Intracatheter Q12H  . 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 If 8PM-8AM, please contact night-coverage at [email or web address], password [facility] 11/27/2012, 7:47 AM  LOS: 16 days

## 2012-11-27 NOTE — Progress Notes (Signed)
13 Days Post-Op   Assessment: s/p Procedure(s): Washout Abdominal Wound, debridement of Fascia, Partial Closure Patient Active Problem List   Diagnosis Date Noted  . Delirium 11/24/2012  . Acute respiratory failure 11/13/2012  . Septic shock(785.52) 11/13/2012  . Aspiration pneumonia 11/12/2012  . Dyspnea 11/12/2012  . Cough 11/12/2012  . Wheezing 11/12/2012  . Hypoglycemia 11/12/2012  . Transaminitis 11/12/2012  . Alcoholic hepatitis 11/12/2012  . Normocytic anemia 11/12/2012  . Ileus 11/12/2012  . Perforation of jejunum s/p primary repair 11/12/2012 11/12/2012  . Nausea & vomiting 11/11/2012  . Abdominal pain, generalized 11/11/2012  . Obesity 11/11/2012  . ARF (acute renal failure) 11/11/2012  . 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 Coagulopathy 11/11/2012  . Hyperlipidemia 11/11/2012  . Anxiety 11/11/2012  . Alcohol withdrawal 11/11/2012  . Increased bilirubin level 11/11/2012  . Gastrointestinal hemorrhage 09/25/2012  . Anemia 09/25/2012  . Alcohol abuse 09/18/2012  . Hyponatremia 09/18/2012  . Thrombocytopenia 09/18/2012  . Elevated INR 09/18/2012  . Anemia due to blood loss, acute 01/24/2012  . HYPERGLYCEMIA 10/14/2008  . DIVERTICULOSIS OF COLON 07/20/2007  . HYPERTENSION 01/30/2007  . 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 CEREBROVASCULAR ACCIDENT, HX OF 01/30/2007    Progressing New diarrhea, likely just from restarting diet. c Diff pending  Plan: Advance diet Pending SLP visit today  Subjective: Having multiple loose stools, tolerating diet, no significant pain and feels she is making progress  Objective: Vital signs in last 24 hours: Temp:  [97.3 F (36.3 C)-100 F (37.8 C)] 97.3 F (36.3 C) (05/23 0619) Pulse Rate:  [57-98] 57 (05/23 0619) Resp:  [16-27] 16 (05/23 0619) BP: (95-153)/(51-62) 153/60 mmHg (05/23 0619) SpO2:  [91 %-100 %] 100 % (05/23 0619)   Intake/Output from previous day: 05/22 0701 - 05/23  0700 In: 520 [P.O.:240; I.V.:80; TPN:200] Out: 1045 [Urine:1045]  General appearance: alert, cooperative, fatigued and no distress Resp: clear to auscultation bilaterally GI: Soft, less distender, not tender  Incision: Clean and healing   Lab Results:   Recent Labs  11/25/12 0340 11/27/12 0420  WBC 14.0* 8.2  HGB 8.5* 8.1*  HCT 27.1* 25.1*  PLT 153 177   BMET  Recent Labs  11/26/12 0507 11/27/12 0420  NA 141 141  K 3.6 3.5  CL 107 106  CO2 29 28  GLUCOSE 112* 100*  BUN 25* 26*  CREATININE 0.79 1.03  CALCIUM 7.8* 7.4*    MEDS, Scheduled . albuterol  2.5 mg Nebulization Q6H  . antiseptic oral rinse  15 mL Mouth Rinse Q4H  . feeding supplement  1 Container Oral Daily  . folic acid  1 mg Oral Daily  . insulin aspart  0-20 Units Subcutaneous TID AC & HS  . labetalol  100 mg Oral TID  . levothyroxine  25 mcg Oral QAC breakfast  . lip balm  1 application Topical BID  . multivitamin with minerals  1 tablet Oral Daily  . pantoprazole  40 mg Oral Daily  . QUEtiapine  25 mg Oral QHS  . sodium chloride  10-40 mL Intracatheter Q12H  . thiamine  100 mg Oral Daily    Studies/Results: No results found.    LOS: 16 days     Currie Paris, MD, [facility] Surgery, [place] [phone]   11/27/2012 8:38 AM

## 2012-11-27 NOTE — Progress Notes (Signed)
PT Cancellation Note  Patient Details Name: WILLIA GENRICH MRN: [id number] DOB: 1945/12/08   Cancelled Treatment:    Reason Eval/Treat Not Completed: Other (comment)OT just worked with pt and is back in bed.   Sharena, Dibenedetto 11/27/2012, 3:40 PM

## 2012-11-27 NOTE — Progress Notes (Signed)
Speech Language Pathology Dysphagia Treatment Patient Details Name: [name] [name] MRN: [id number] DOB: 1945-10-31 Today's Date: 11/27/2012 Time: 1257-1310 SLP Time Calculation (min): 13 min  Assessment / Plan / Recommendation Clinical Impression  F/u after yesterday's clinical swallow eval: Pt with lunch tray at bedside.  Assisted with consumption of dysphagia 2 and 1 consistencies - pt with persisting prolonged mastication of solids.  Trials of thin liquids were likely aspirated - pt with immediate, explosive coughing associated with thin liquids.  Voice remains hoarse with greater periods of aphonia than actual phonation.  Suspect as phonatory ability improves, we will see concomitant improvement in swallow function (cognitive deficits also remain a barrier.) Continues to tolerate nectar thick liquids with no signs of airway compromise.    Recommend continue conservative diet over weekend; SLP will f/u Monday to determine readiness for diet advancement.  Pt verbalizes agreement; discussed with RN.      Diet Recommendation  Continue with Current Diet: Dysphagia 2 (fine chop);Nectar-thick liquid    SLP Plan Continue with current plan of care   Pertinent Vitals/Pain No pain   Swallowing Goals  SLP Swallowing Goals Patient will utilize recommended strategies during swallow to increase swallowing safety with: Maximum assistance Swallow Study Goal #2 - Progress: Progressing toward goal Goal #3: Pt will demonstration ability to phonate 4 words per breath group with adequate control to demonstrate improved airway protection.   Swallow Study Goal #3 - Progress: Progressing toward goal  General Temperature Spikes Noted: No Respiratory Status: Supplemental O2 delivered via (comment) (nasal cannula) Behavior/Cognition: Alert;Distractible;Decreased sustained attention;Requires cueing;Other (comment) Oral Cavity - Dentition: Adequate natural dentition Patient Positioning: Upright in  bed  Oral Cavity - Oral Hygiene Does patient have any of the following "at risk" factors?: Diet - patient on thickened liquids Patient is AT RISK - Oral Care Protocol followed (see row info): Yes   Dysphagia Treatment Treatment focused on: Skilled observation of diet tolerance;Upgraded PO texture trials Treatment Methods/Modalities: Skilled observation Patient observed directly with PO's: Yes Type of PO's observed: Dysphagia 2 (chopped);Thin liquids;Nectar-thick liquids Feeding: Able to feed self Liquids provided via: Cup;Straw Oral Phase Signs & Symptoms: Prolonged bolus formation Pharyngeal Phase Signs & Symptoms: Immediate cough (with thin liquids;; no cough with nectars) Type of cueing: Verbal Amount of cueing: Moderate   GO    [name] [name] L. [name] [name], [place] CCC/SLP Pager [phone]  [name] [name] [name] [name] 11/27/2012, 1:23 PM

## 2012-11-28 ENCOUNTER — Encounter (HOSPITAL_COMMUNITY): Payer: Self-pay | Admitting: Radiology

## 2012-11-28 ENCOUNTER — Inpatient Hospital Stay (HOSPITAL_COMMUNITY): Payer: BC Managed Care – PPO

## 2012-11-28 DIAGNOSIS — K7460 Unspecified cirrhosis of liver: Secondary | ICD-10-CM

## 2012-11-28 DIAGNOSIS — R6521 Severe sepsis with septic shock: Secondary | ICD-10-CM

## 2012-11-28 DIAGNOSIS — R404 Transient alteration of awareness: Secondary | ICD-10-CM

## 2012-11-28 DIAGNOSIS — K631 Perforation of intestine (nontraumatic): Secondary | ICD-10-CM

## 2012-11-28 DIAGNOSIS — A419 Sepsis, unspecified organism: Secondary | ICD-10-CM

## 2012-11-28 LAB — CBC
HCT: 26 % — ABNORMAL LOW (ref 36.0–46.0)
MCHC: 30.8 g/dL (ref 30.0–36.0)
MCV: 94.5 fL (ref 78.0–100.0)
Platelets: 171 10*3/uL (ref 150–400)
RDW: 20.6 % — ABNORMAL HIGH (ref 11.5–15.5)
WBC: 8.5 10*3/uL (ref 4.0–10.5)

## 2012-11-28 LAB — GLUCOSE, CAPILLARY
Glucose-Capillary: 135 mg/dL — ABNORMAL HIGH (ref 70–99)
Glucose-Capillary: 129 mg/dL — ABNORMAL HIGH (ref 70–99)

## 2012-11-28 MED ORDER — FUROSEMIDE 10 MG/ML IJ SOLN
40.0000 mg | Freq: Once | INTRAMUSCULAR | Status: DC
  Filled 2012-11-28: qty 4

## 2012-11-28 MED ORDER — IOHEXOL 300 MG/ML  SOLN
100.0000 mL | Freq: Once | INTRAMUSCULAR | Status: AC | PRN
  Administered 2012-11-28: 100 mL via INTRAVENOUS

## 2012-11-28 NOTE — Progress Notes (Signed)
Met with Pt to discuss bed offers.  Pt acknowledged that she received offers but stated that she has yet to review them.    Pt understands that she will need to have a decision made by Monday.  CSW thanked Pt for her time.  Providence Crosby, LCSWA Clinical Social Work [phone]

## 2012-11-28 NOTE — Progress Notes (Signed)
14 Days Post-Op   Assessment: s/p Procedure(s): Washout Abdominal Wound, debridement of Fascia, Partial Closure Patient Active Problem List   Diagnosis Date Noted  . Delirium 11/24/2012  . Acute respiratory failure 11/13/2012  . Septic shock(785.52) 11/13/2012  . Aspiration pneumonia 11/12/2012  . Dyspnea 11/12/2012  . Cough 11/12/2012  . Wheezing 11/12/2012  . Hypoglycemia 11/12/2012  . Transaminitis 11/12/2012  . Alcoholic hepatitis 11/12/2012  . Normocytic anemia 11/12/2012  . Ileus 11/12/2012  . Perforation of jejunum s/p primary repair 11/12/2012 11/12/2012  . Nausea & vomiting 11/11/2012  . Abdominal pain, generalized 11/11/2012  . Obesity 11/11/2012  . ARF (acute renal failure) 11/11/2012  . 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 Coagulopathy 11/11/2012  . Hyperlipidemia 11/11/2012  . Anxiety 11/11/2012  . Alcohol withdrawal 11/11/2012  . Increased bilirubin level 11/11/2012  . Gastrointestinal hemorrhage 09/25/2012  . Anemia 09/25/2012  . Alcohol abuse 09/18/2012  . Hyponatremia 09/18/2012  . Thrombocytopenia 09/18/2012  . Elevated INR 09/18/2012  . Anemia due to blood loss, acute 01/24/2012  . HYPERGLYCEMIA 10/14/2008  . DIVERTICULOSIS OF COLON 07/20/2007  . HYPERTENSION 01/30/2007  . 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 CEREBROVASCULAR ACCIDENT, HX OF 01/30/2007    Seems stable, no apparent issues  Plan: No changes today, await recommendation from SLP to advance diet. May need to work on discharrge planning  Subjective: No c/o but drowsy the am. Says no pain  Objective: Vital signs in last 24 hours: Temp:  [98 F (36.7 C)-98.3 F (36.8 C)] 98 F (36.7 C) (05/24 0447) Pulse Rate:  [81-90] 88 (05/24 0447) Resp:  [18-20] 20 (05/23 2026) BP: (92-112)/(53-58) 109/56 mmHg (05/24 0447) SpO2:  [95 %-100 %] 100 % (05/24 0447)   Intake/Output from previous day: 05/23 0701 - 05/24 0700 In: 360 [P.O.:360] Out: 1060  [Urine:1060]  General appearance: cooperative, fatigued, no distress and drowsy Resp: clear to auscultation bilaterally GI: still distended, soft, no localized tenderness, BS+  Incision: Clean  Lab Results:   Recent Labs  11/27/12 0420 11/28/12 0425  WBC 8.2 8.5  HGB 8.1* 8.0*  HCT 25.1* 26.0*  PLT 177 171   BMET  Recent Labs  11/26/12 0507 11/27/12 0420  NA 141 141  K 3.6 3.5  CL 107 106  CO2 29 28  GLUCOSE 112* 100*  BUN 25* 26*  CREATININE 0.79 1.03  CALCIUM 7.8* 7.4*    MEDS, Scheduled . albuterol  2.5 mg Nebulization Q6H  . antiseptic oral rinse  15 mL Mouth Rinse Q4H  . enoxaparin (LOVENOX) injection  40 mg Subcutaneous Q24H  . feeding supplement  1 Container Oral Daily  . folic acid  1 mg Oral Daily  . furosemide  40 mg Intravenous Once  . insulin aspart  0-20 Units Subcutaneous TID AC & HS  . labetalol  100 mg Oral TID  . levothyroxine  25 mcg Oral QAC breakfast  . lip balm  1 application Topical BID  . multivitamin with minerals  1 tablet Oral Daily  . pantoprazole  40 mg Oral Daily  . QUEtiapine  25 mg Oral QHS  . sodium chloride  10-40 mL Intracatheter Q12H  . thiamine  100 mg Oral Daily    Studies/Results: Dg Chest Port 1 View  11/27/2012   *RADIOLOGY REPORT*  Clinical Data: Pneumonia.  Shortness of breath for 3 weeks. History of hypertension  PORTABLE CHEST - 1 VIEW  Comparison: 11/25/2012  Findings: A right subclavian CVP is stable in position.  Heart and mediastinal contours are unchanged with mitral annulus calcification and aortic calcification again noted.  The lung fields are notable for persistent but improved pulmonary vascular congestion.  No overt congestive failure is suggested. Focal density at the left base may be the result of atelectasis or focal residual basilar alveolar edema but an area of focal infiltrate is not excluded.  Small bilateral pleural effusions are again noted.  Focal subsegmental atelectasis is seen in the left lung  zone.  IMPRESSION: Interval improvement in pulmonary vascular congestion and edema pattern.  Left lower lobe density, question atelectasis, residual focal alveolar edema versus infiltrate.   Original Report Authenticated By: Rhodia Albright, M.D.      LOS: 17 days     Currie Paris, MD, [facility] Surgery, [place] [phone]   11/28/2012 9:18 AM

## 2012-11-28 NOTE — Progress Notes (Signed)
Abdominal dressing changed. Patient tolerated procedure well.

## 2012-11-28 NOTE — Progress Notes (Signed)
Physical Therapy Treatment Patient Details Name: [name] [name] MRN: [id number] DOB: 1946/05/01 Today's Date: 11/28/2012 Time: 7829-5621 PT Time Calculation (min): 25 min  PT Assessment / Plan / Recommendation Comments on Treatment Session  pt with increased effort, decreased assistance required from last session; pt still having trouble with voice and unable to speak at times; Will need post acute rehab    Follow Up Recommendations  SNF;Supervision/Assistance - 24 hour     Does the patient have the potential to tolerate intense rehabilitation     Barriers to Discharge        Equipment Recommendations  None recommended by PT    Recommendations for Other Services    Frequency Min 3X/week   Plan Discharge plan remains appropriate;Frequency remains appropriate    Precautions / Restrictions Precautions Precautions: Fall Precaution Comments: abd surgery, abd binder when up   Pertinent Vitals/Pain Denies pain, some soreness right side with bed mobility    Mobility  Bed Mobility Bed Mobility: Rolling Right;Rolling Left;Right Sidelying to Sit Rolling Right: 2: Max assist Rolling Left: 1: +2 Total assist Rolling Left: Patient Percentage: 20% Right Sidelying to Sit: 1: +2 Total assist;HOB flat;With rails Right Sidelying to Sit: Patient Percentage: 30% Details for Bed Mobility Assistance: increased time and multimodal cues needed.   Transfers Transfers: Sit to Stand;Stand to Sit Sit to Stand: 3: Mod assist;From chair/3-in-1;From bed Stand to Sit: 3: Mod assist;To chair/3-in-1;To bed Details for Transfer Assistance: multi-modal cues for hand placement and wt shift Ambulation/Gait Ambulation/Gait Assistance: 1: +2 Total assist Ambulation/Gait: Patient Percentage: 60% Ambulation Distance (Feet): 18 Feet (10) Assistive device: Rolling walker Ambulation/Gait Assistance Details: cues for incr step length, RW position, use of UEs, facilitation of lateral wt shift to advance LEs;  +2 for chair and safety Gait Pattern: Decreased step length - right;Decreased step length - left;Step-to pattern;Shuffle;Narrow base of support General Gait Details: sitting rest inbetween gait distance, Sats >94% on RA with activity    Exercises Total Joint Exercises Ankle Circles/Pumps: AROM;Both;10 reps Quad Sets: AROM;Both;10 reps   PT Diagnosis:    PT Problem List:   PT Treatment Interventions:     PT Goals Acute Rehab PT Goals Time For Goal Achievement: 12/07/12 Potential to Achieve Goals: Good Pt will go Supine/Side to Sit: with mod assist PT Goal: Supine/Side to Sit - Progress: Progressing toward goal Pt will go Sit to Stand: with mod assist PT Goal: Sit to Stand - Progress: Progressing toward goal Pt will Ambulate: 1 - 15 feet;with mod assist;with least restrictive assistive device PT Goal: Ambulate - Progress: Progressing toward goal  Visit Information  Last PT Received On: 11/28/12 Assistance Needed: +2    Subjective Data  Subjective: pt nods yes, states she is still having trouble with her voice Patient Stated Goal: none stated   Cognition  Cognition Arousal/Alertness: Awake/alert Behavior During Therapy: WFL for tasks assessed/performed Overall Cognitive Status: Impaired/Different from baseline Area of Impairment: Safety/judgement;Attention Safety/Judgement: Decreased awareness of safety;Decreased awareness of deficits    Balance  Static Sitting Balance Static Sitting - Balance Support: No upper extremity supported Static Sitting - Level of Assistance: 5: Stand by assistance Static Sitting - Comment/# of Minutes: 3  End of Session PT - End of Session Equipment Utilized During Treatment: Gait belt;Other (comment) (abd binder) Activity Tolerance: Patient tolerated treatment well Patient left: in chair;with call bell/phone within reach;with family/visitor present Nurse Communication: Mobility status   GP     [facility] 11/28/2012, 4:08 PM

## 2012-11-28 NOTE — Progress Notes (Signed)
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   Coagulopathy   Hyperlipidemia   Anxiety   Alcohol withdrawal   Increased bilirubin level   Aspiration pneumonia   Dyspnea   Cough   Wheezing   Hypoglycemia   Transaminitis   Alcoholic hepatitis   Normocytic anemia   Ileus   Acute respiratory failure   Septic shock(785.52)   Delirium      WL4 Assumed care on 5/23    Respiratory failure-postoperative  Status post extubation on 5/18  Oxygen requirements stable  Started on Lovenox for DVT prophylaxis Lovenox is being held because of thrombocytopenia Still has some wheezing Started on nebulizer treatments every 6 hours   Sepsis/septic shock secondary to peritonitis  Off vasopressors  last echo in 2007 showed EF of 55-65% with some mitral valve calcification      Acute kidney injury resolved, continue diuresis    Alcoholic cirrhosis and Acute alcoholic hepatitis.  Transaminitis - improved    Perforated viscus in jejunem s/p repair 5/8.  s/p Procedure(s):  [place] Abdominal Wound, debridement of Fascia, Partial Closure NG tube removed  Nutrition consult  Wound care per CC S.  Outpatient GI followup  Off antibiotics   Protein malnutrition       Coagulopathy/thrombocytopenia platelet count improving    Hypothyroidism continue Synthroid   Acute encephalopathy resolved-continue thiamine folate secondary to alcohol abuse history, born benzodiazepines, continue cerebral    Hoarseness after extubation-outpatient ENT followup    Code Status: full  Family Communication: family  updated about patient's clinical progress  Disposition Plan: Continue PT OT evaluation , will likely need SNF   BRIEF PATIENT DESCRIPTION: 67 yo with alcoholic liver disease was admitted on 5/7 with abdominal pain and was found on 5/8 to have a small bowel perforation. She was taken to the OR and returned to the ICU with an open wound and mechanically ventilated.    SIGNIFICANT EVENTS / STUDIES:  5/7 Admitted with abdominal pain, nausea, vomiting  5/7 OR >>> Perforated viscus, peritonitis, lysis of adhesions, small bowel repair  5/16 Reintubated r/t complete left sided collapse, increased WBC  5/18 Extubated, WBC decreased, TF started  5/20 Speech assessment >> D1 diet  5/22 D/c TPN, transfer to non tele floor bed    LINES / TUBES:  OETT 5/8 >>>5/15>>>Reintubated 5/16>>> 5/18  NGT 5/8 >>> 5/20  Foley 5/8 >>>  R rad A-line 5/8 >>>5/12>>>5/16>>>5/18  L IJ CVL 5/8 >>>5/19  R Brach PICC>>>5/19  CULTURES:  5/8 Blood >>>negative  5/7 Urine >>>negative  Blood 5/15>>>neg  Urine 5/15>>>neg  Sputum 5/15>>>neg  Sputum via bronch 5/16>>> Neg  ANTIBIOTICS:  Aztreonam 5/8 >>>5/9  Flagyl 5/8 >>>5/9  Levaquin 5/8 x 1  Cipro 5/8 x 1  ertapenem 5/9>>>5/16  Vanc 5/15 >>>5/19  Primaxin 5/16 >>>5/21   Interval events:  Still has the hoarseness, less short of breath today No acute complaints this morning   Objective: Filed Vitals:   11/27/12 2026 11/27/12 2300 11/28/12 0123 11/28/12 0447  BP: 92/53 112/58  109/56  Pulse: 82 90  88  Temp: 98.3 F (36.8 C)   98 F (36.7  C)  TempSrc: Oral   Oral  Resp: 20     Height:      Weight:      SpO2: 98%  96% 100%    Intake/Output Summary (Last 24 hours) at 11/28/12 0837 Last data filed at 11/28/12 0450  Gross per 24 hour  Intake    360 ml  Output   1060 ml  Net   -700 ml    Exam:  HENT:  Head: Atraumatic.  Nose: Nose normal.  Mouth/Throat: Oropharynx is clear and moist.  Eyes: Conjunctivae are normal. Pupils are equal, round,  and reactive to light. No scleral icterus.  Neck: Neck supple. No tracheal deviation present.  Cardiovascular: Normal rate, regular rhythm, normal heart sounds and intact distal pulses.  Pulmonary/Chest: Effort normal and breath sounds normal. No respiratory distress.  Abdominal: Soft. Normal appearance and bowel sounds are normal. She exhibits no distension. There is no tenderness.  Musculoskeletal: She exhibits no edema and no tenderness.  Neurological: She is alert. No cranial nerve deficit.    Data Reviewed: Basic Metabolic Panel:  Recent Labs Lab 11/23/12 0430 11/24/12 0400 11/25/12 0340 11/26/12 0507 11/27/12 0420  NA 139 139 139 141 141  K 3.5 3.5 3.7 3.6 3.5  CL 104 103 105 107 106  CO2 30 32 30 29 28   GLUCOSE 157* 162* 130* 112* 100*  BUN 25* 24* 23 25* 26*  CREATININE 0.80 0.76 0.74 0.79 1.03  CALCIUM 7.9* 7.7* 7.8* 7.8* 7.4*  MG 1.8 2.1 2.2 2.2  --   PHOS 3.2  --   --  3.8  --     Liver Function Tests:  Recent Labs Lab 11/22/12 0400 11/23/12 0430 11/26/12 0507  AST 77* 104* 75*  ALT 33 36* 29  ALKPHOS 117 149* 160*  BILITOT 2.9* 3.8* 3.2*  PROT 5.8* 5.9* 6.3  ALBUMIN 1.4* 1.3* 1.2*   No results found for this basename: LIPASE, AMYLASE,  in the last 168 hours  Recent Labs Lab 11/23/12 1200  AMMONIA 32    CBC:  Recent Labs Lab 11/22/12 0400 11/23/12 0430 11/24/12 0400 11/25/12 0340 11/27/12 0420 11/28/12 0425  WBC 13.8* 14.4* 15.1* 14.0* 8.2 8.5  NEUTROABS 11.8* 12.4*  --   --   --   --   HGB 8.4* 8.5* 8.7* 8.5* 8.1* 8.0*  HCT 27.3* 27.3* 28.1* 27.1* 25.1* 26.0*  MCV 95.1 94.8 94.3 94.8 94.7 94.5  PLT 103* 116* 151 153 177 171    Cardiac Enzymes: No results found for this basename: CKTOTAL, CKMB, CKMBINDEX, TROPONINI,  in the last 168 hours BNP (last 3 results) No results found for this basename: PROBNP,  in the last 8760 hours   CBG:  Recent Labs Lab 11/26/12 2129 11/27/12 0745 11/27/12 1145 11/27/12 1651 11/27/12 2155   GLUCAP 110* 100* 137* 85 94    Recent Results (from the past 240 hour(s))  CULTURE, BLOOD (ROUTINE X 2)     Status: None   Collection Time    11/19/12  9:30 AM      Result Value Range Status   Specimen Description BLOOD RIGHT ARM   Final   Special Requests BOTTLES DRAWN AEROBIC AND ANAEROBIC 8CC   Final   Culture  Setup Time 11/19/2012 12:44   Final   Culture NO GROWTH 5 DAYS   Final   Report Status 11/25/2012 FINAL   Final  URINE CULTURE     Status: None   Collection Time    11/19/12  9:35 AM      Result Value Range Status   Specimen Description URINE, CATHETERIZED   Final   Special Requests Normal   Final   Culture  Setup Time 11/19/2012 13:48   Final   Colony Count NO GROWTH   Final   Culture NO GROWTH   Final   Report Status 11/20/2012 FINAL   Final  CULTURE, BLOOD (ROUTINE X 2)     Status: None   Collection Time    11/19/12  9:40 AM      Result Value Range Status   Specimen Description BLOOD RIGHT HAND   Final   Special Requests BOTTLES DRAWN AEROBIC ONLY 2CC   Final   Culture  Setup Time 11/19/2012 12:44   Final   Culture NO GROWTH 5 DAYS   Final   Report Status 11/25/2012 FINAL   Final  CULTURE, RESPIRATORY (NON-EXPECTORATED)     Status: None   Collection Time    11/19/12 10:50 AM      Result Value Range Status   Specimen Description TRACHEAL ASPIRATE   Final   Special Requests NONE   Final   Gram Stain     Final   Value: FEW WBC PRESENT,BOTH PMN AND MONONUCLEAR     NO SQUAMOUS EPITHELIAL CELLS SEEN     NO ORGANISMS SEEN   Culture Non-Pathogenic Oropharyngeal-type Flora Isolated.   Final   Report Status 11/21/2012 FINAL   Final  CULTURE, RESPIRATORY (NON-EXPECTORATED)     Status: None   Collection Time    11/20/12 10:56 AM      Result Value Range Status   Specimen Description TRACHEAL ASPIRATE   Final   Special Requests Normal   Final   Gram Stain     Final   Value: FEW WBC PRESENT,BOTH PMN AND MONONUCLEAR     NO SQUAMOUS EPITHELIAL CELLS SEEN     NO  ORGANISMS SEEN   Culture Non-Pathogenic Oropharyngeal-type Flora Isolated.   Final   Report Status 11/22/2012 FINAL   Final  CLOSTRIDIUM DIFFICILE BY PCR     Status: None   Collection Time    11/27/12  3:20 AM      Result Value Range Status   C difficile by pcr NEGATIVE  NEGATIVE Final     Studies: Abd 1 View (kub)  11/11/2012   *RADIOLOGY REPORT*  Clinical Data: Abdominal pain with nausea and vomiting.  Reduced bowel sounds.  ABDOMEN - 1 VIEW  Comparison: 10/05/2012  Findings: There is a ovoid shaped gas structure in the midline which likely represents stomach.  Inferior and lateral to this there are moderately distended bowel loops which could represent small bowel or colon.  A similar appearance was noted on the prior exam from 10/05/2012, although the bowel loops are more distended at that time. A linear radiopaque density which was previously located in the right upper quadrant  now projects over the left lower quadrant. A more subtle left lower quadrant radiopaque density located along the left panniculus remains unchanged.  IMPRESSION: Gas-filled mid abdominal loops are not as distended as previously noted, but mildly abnormal. Findings consistent with ileus although early obstruction not excluded.  Previously noted right upper quadrant radiopaque density has transited to the left side of the abdomen.   Original Report Authenticated By: Davonna Belling, M.D.   Ct Abdomen Pelvis W Contrast  11/19/2012   *RADIOLOGY REPORT*  Clinical Data: Abdominal pain, leukocytosis, recent surgery for perforated small bowel  CT ABDOMEN AND PELVIS WITH  CONTRAST  Technique:  Multidetector CT imaging of the abdomen and pelvis was performed following the standard protocol during bolus administration of intravenous contrast.  Contrast: OMNIPAQUE IOHEXOL 300 MG/ML  SOLN  Comparison: 09/18/2012  Findings: Motion degraded images.  Mild patchy opacity in the right lower lobe (series 5/image 7), suspicious for  pneumonia.  Additional patchy opacity in the left lower lobe (series 5/image 8), suspicious for pneumonia, less likely compressive atelectasis.  Right basilar opacity, likely atelectasis.  Small left and trace right pleural effusions.  Cirrhotic configuration of the liver.  1.9 x 1.5 cm hypoenhancing lesion in the lateral segment left hepatic lobe, previously favored to reflect a benign hemangioma although indeterminate, unchanged from 2013.  Spleen, pancreas, and adrenal glands are within normal limits.  Small layering gallstones (series 2/image 38).  No associated inflammatory changes by CT.  Kidneys are unremarkable.  No renal calculi or hydronephrosis.  No evidence of bowel obstruction.  No contrast extravasation to suggest leak/perforation following surgery.  Colonic diverticulosis, without associate inflammatory changes.  No free air.  Atherosclerotic calcifications of the abdominal aorta and branch vessels. Shunt/collateral vessel in the left mid abdomen (series 2/image 60), between the portosplenic confluence and the IVC. Portal vein is patent.  Moderate abdominopelvic ascites.  Uterus is mildly heterogeneous, possibly reflecting uterine fibroids.  No adnexal masses.  Bladder decompressed by indwelling Foley catheter.  Postsurgical changes in the midline anterior abdominal wall.  Body wall edema.  Degenerative changes of the visualized thoracolumbar spine.  IMPRESSION: No oral contrast extravasation to suggest leak/perforation.  No free air.  Cirrhosis.  Venous collaterals between the portable at confluence in the IVC.  Portal vein remains patent.  1.9 x 1.5 cm hypoenhancing lesion in the lateral segment left hepatic lobe, indeterminate, unchanged since 2013.  Correlate with serum AFP and consider follow-up MRI abdomen with/without contrast for further characterization as an outpatient.  Moderate abdominopelvic ascites.  Body wall edema.  Suspected right middle lobe and possible left lower lobe pneumonia.  Small left and trace right pleural effusions.  Additional ancillary findings as above.   Original Report Authenticated By: Charline Bills, M.D.   Dg Chest Port 1 View  11/27/2012   *RADIOLOGY REPORT*  Clinical Data: Pneumonia.  Shortness of breath for 3 weeks. History of hypertension  PORTABLE CHEST - 1 VIEW  Comparison: 11/25/2012  Findings: A right subclavian CVP is stable in position.  Heart and mediastinal contours are unchanged with mitral annulus calcification and aortic calcification again noted.  The lung fields are notable for persistent but improved pulmonary vascular congestion.  No overt congestive failure is suggested. Focal density at the left base may be the result of atelectasis or focal residual basilar alveolar edema but an area of focal infiltrate is not excluded.  Small bilateral pleural effusions are again noted.  Focal subsegmental atelectasis is seen in the left lung zone.  IMPRESSION: Interval improvement in pulmonary vascular congestion and edema pattern.  Left lower lobe density, question atelectasis, residual focal alveolar edema versus infiltrate.   Original Report Authenticated By: Rhodia Albright, M.D.   Dg Chest Port 1 View  11/25/2012   *RADIOLOGY REPORT*  Clinical Data: Shortness of breath.  PORTABLE CHEST - 1 VIEW  Comparison: 11/24/2012.  Findings: The right PICC line is stable.  The NG tube has been removed.  Persistent low lung volumes with vascular crowding, atelectasis, vascular congestion and small effusions.  IMPRESSION:  1.  Removal of NG tube. 2.  Persistent vascular congestion, edema,  atelectasis and effusions.   Original Report Authenticated By: Rudie Meyer, M.D.   Dg Chest Port 1 View  11/24/2012   *RADIOLOGY REPORT*  Clinical Data: Follow up atelectasis.  PORTABLE CHEST - 1 VIEW  Comparison: 11/23/2012.  Findings: The NG tube is stable.  The left IJ catheter has been removed. A new right PICC line is in good position with the tip near the cavoatrial  junction.  The heart lungs appears stable with persistent low lung volumes, vascular crowding, atelectasis and effusions.  IMPRESSION:  1.  Removal of left IJ catheter. 2.  New right-sided PICC line with tip near the cavoatrial junction. 3.  Persistent low lung volumes with vascular crowding, atelectasis, vascular congestion and probable small effusions.   Original Report Authenticated By: Rudie Meyer, M.D.   Dg Chest Port 1 View  11/23/2012   *RADIOLOGY REPORT*  Clinical Data: Status post extubation  PORTABLE CHEST - 1 VIEW  Comparison: 11/22/2012  Findings: The cardiac shadow is stable.  The endotracheal tube has been removed.  The nasogastric catheter and left-sided central venous line remain in place. The lungs are well-aerated again demonstrates some mild bibasilar changes.  IMPRESSION: Interval extubation.  The remainder of the exam is stable from prior study.   Original Report Authenticated By: Alcide Clever, M.D.   Dg Chest Port 1 View  11/22/2012   *RADIOLOGY REPORT*  Clinical Data: Evaluate collapse.  History stroke.  Hypertension.  PORTABLE CHEST - 1 VIEW  Comparison: 11/21/2012  Findings: Endotracheal tube appropriately position with tip 3 cm above carina.  Nasogastric tube looped in stomach with tip below the inferior aspect of film.  Left IJ central line terminates at the cavoatrial junction.  Cardiomegaly accentuated by AP portable technique.  Atherosclerosis in the transverse aorta.  Persistent small left pleural effusion. No pneumothorax.  Low lung volumes and mild interstitial edema, similar.  No change in left greater than right bibasilar airspace disease.  IMPRESSION: No significant change since one day prior.  Bibasilar atelectasis versus infection, greater left than right.  Low lung volumes with mild interstitial edema and persistent small left pleural effusion.  Age advanced aortic atherosclerosis.   Original Report Authenticated By: Jeronimo Greaves, M.D.   Portable Chest Xray In  Am  11/21/2012   *RADIOLOGY REPORT*  Clinical Data: New.  PORTABLE CHEST - 1 VIEW  Comparison: 5/16, 5/15, and 11/18/2012  Findings: There is been partial clearing of the infiltrates at both bases.  Heart size and vascularity are normal.  Endotracheal tube and central venous catheter and NG tube are unchanged.  IMPRESSION: Improvement in the bibasilar infiltrates.   Original Report Authenticated By: Francene Boyers, M.D.   Portable Chest Xray  11/20/2012   *RADIOLOGY REPORT*  Clinical Data: Endotracheal tube placement.  PORTABLE CHEST - 1 VIEW  Comparison: 11/20/2012 5:00 a.m. and 11/19/2012.  Findings: Endotracheal tube has been placed with the tip 3.5 cm above the carina.  Improved aeration of the left lung with residual left base atelectasis and / or left-sided pleural effusion. Decrease in degree of midline shift to the left.  Left base infiltrate would be difficult to exclude with the presence of atelectasis.  Left central line tip distal superior vena cava level.  No gross pneumothorax.  Pulmonary vascular congestion/pulmonary edema most notable centrally.  Nasogastric tube courses below the diaphragm.  The tip is not included on this exam.  Heart size difficult to adequately assessed.  Mitral valve calcifications.  Calcification of the aorta.  IMPRESSION: Endotracheal tube  has been placed with the tip 3.5 cm above the carina.  Improved aeration of the left lung with residual left base atelectasis and / or left-sided pleural effusion. Decrease in degree of midline shift to the left.  Pulmonary vascular congestion/ pulmonary edema most notable centrally.  This is a call report.   Original Report Authenticated By: Lacy Duverney, M.D.   Dg Chest Port 1 View  11/20/2012   *RADIOLOGY REPORT*  Clinical Data: Evaluate endotracheal tube placement.  PORTABLE CHEST - 1 VIEW  Comparison: 11/19/2012  Findings: Endotracheal tube has been removed.  There is now complete opacification of the left hemithorax.  Findings  are most compatible with volume loss and mucous plugging.  Mediastinal shift towards the left.  Nasogastric tube extends into the abdomen.  Left jugular central line is in the SVC region.  Streaky densities at the right lung base are most compatible atelectasis.  IMPRESSION: Complete opacification of the left hemithorax is most compatible with volume loss and mucous plugging.  These results were called by telephone on 11/20/2012 at 01/1943 and to the patient's nurse, Kennyth Arnold, who verbally acknowledged these results.   Original Report Authenticated By: Richarda Overlie, M.D.   Dg Chest Port 1 View  11/19/2012   *RADIOLOGY REPORT*  Clinical Data: Pulmonary vascular congestion.  Endotracheal tube placement.  PORTABLE CHEST - 1 VIEW  Comparison: 11/18/2012  Findings: Endotracheal tube, central venous catheter, and NG tube all appear in good position.  Pulmonary vascular congestion persists.  Increasing density at the left base is probably an effusion.  Slight atelectasis at the right base is unchanged.  IMPRESSION: Increasing left pleural effusion.  Persistent pulmonary vascular congestion and slight right base atelectasis.   Original Report Authenticated By: Francene Boyers, M.D.   Dg Chest Port 1 View  11/18/2012   *RADIOLOGY REPORT*  Clinical Data: Endotracheal tube placement  PORTABLE CHEST - 1 VIEW  Comparison: 11/17/2012  Findings: 0453 hours.  Endotracheal tube tip is 3.2 cm above the base of the carina.  The left IJ central venous catheter tip overlies the mid to lower SVC.  Lung volumes are low. The cardiopericardial silhouette is enlarged.  Vascular congestion noted without overt airspace pulmonary edema.  There is slight improvement in basilar lung aeration. The NG tube passes into the stomach although the distal tip position is not included on the film. Telemetry leads overlie the chest.  IMPRESSION: Low volume film with cardiomegaly vascular congestion.  Slight improvement of bibasilar collapse / consolidation.    Original Report Authenticated By: Kennith Center, M.D.   Dg Chest Port 1 View  11/17/2012   *RADIOLOGY REPORT*  Clinical Data: Status post repair of small bowel perforation. Ventilated patient.  PORTABLE CHEST - 1 VIEW  Comparison: Chest 11/16/2012.  Findings: Support tubes and lines are unchanged.  Bibasilar airspace opacities persist and appear worsened on the right.  No pneumothorax identified.  Heart size is normal.  IMPRESSION: Some worsening of airspace disease in the right lung base.  No change on the left.   Original Report Authenticated By: Holley Dexter, M.D.   Dg Chest Port 1 View  11/16/2012   *RADIOLOGY REPORT*  Clinical Data: Endotracheal placement  PORTABLE CHEST - 1 VIEW  Comparison: 11/15/2012  Findings: Endotracheal tube has its tip 2.5 cm above the carina. Nasogastric tube enters the stomach.  Left internal jugular central line has its tip at the SVC/RA junction.  Heart size is stable. Patchy density in both lung bases persist consistent atelectasis and/or pneumonia.  No new finding.  IMPRESSION: Lines and tubes grossly well positioned.  Lower lobe atelectasis and/or pneumonia persists bilaterally.   Original Report Authenticated By: Paulina Fusi, M.D.   Dg Chest Port 1 View  11/15/2012   *RADIOLOGY REPORT*  Clinical Data: Intubated patient.  PORTABLE CHEST - 1 VIEW  Comparison: The tip in the portable chest 11/14/2012.  Findings: Support tubes and lines are unchanged.  There has been some increase in right basilar atelectasis.  Small left effusion and basilar airspace disease are unchanged.  No pneumothorax.  IMPRESSION:  1.  Support apparatus unchanged and in good position. 2.  Increased right basilar atelectasis. 3.  No marked change in a small left effusion and basilar airspace disease.   Original Report Authenticated By: Holley Dexter, M.D.   Dg Chest Port 1 View  11/14/2012   *RADIOLOGY REPORT*  Clinical Data: Intubated patient.  PORTABLE CHEST - 1 VIEW  Comparison: Chest  11/12/2012.  Findings: Support tubes and lines are unchanged.  Left basilar airspace disease persists.  Right lung appears clear.  No pneumothorax.  Heart size upper normal.  IMPRESSION: No change in left basilar airspace disease.  No new abnormality.   Original Report Authenticated By: Holley Dexter, M.D.   Dg Chest Port 1 View  11/12/2012   *RADIOLOGY REPORT*  Clinical Data: Evaluate central line placement.  PORTABLE CHEST - 1 VIEW  Comparison: 11/12/2012  Findings: Endotracheal tube is 3.8 cm above the carina.  Left jugular central line tip is in the lower SVC region.  No evidence for a pneumothorax.  Persistent left basilar densities could represent atelectasis and cannot exclude pleural fluid.  The nasogastric tube extends into the abdomen.  Stable appearance of the heart.  IMPRESSION: Central line tip in the lower SVC region.  Negative for a pneumothorax.  Stable basilar densities.   Original Report Authenticated By: Richarda Overlie, M.D.   Dg Chest Port 1 View  11/12/2012   *RADIOLOGY REPORT*  Clinical Data: Perforated jejunum.  Peritonitis.  PORTABLE CHEST - 1 VIEW 5:34 p.m.  Comparison: 11/12/2012 at 11:35 a.m.  Findings: Endotracheal tube is in good position 3 cm above the carina.  NG tube tip is in the stomach.  Heart size and vascularity are normal.  There is slight atelectasis in the left upper lobe medially.  Scarring at the left base.  Dense calcification in the mitral valve annulus.  IMPRESSION: New atelectasis in the left upper lobe medially.   Original Report Authenticated By: Francene Boyers, M.D.   Dg Chest Port 1 View  11/12/2012   *RADIOLOGY REPORT*  Clinical Data: Cough and shortness of breath.  Fever.  Nausea and vomiting.  PORTABLE CHEST - 1 VIEW  Comparison: Chest x-rays dated 10/05/2012 and 01/20/2012  Findings: There is suggestion of free air under the right hemidiaphragm.  I recommend a left lateral decubitus view of the abdomen for further evaluation of this possibility.  There is  chronic cardiomegaly with scarring at the left lung base laterally.  Dense calcification in the mitral valve annulus.  There is slight pulmonary vascular congestion.  No infiltrates.  IMPRESSION:  1.  Possible free air under the right hemidiaphragm.  Left lateral decubitus view of the abdomen recommended for further evaluation. 2.  Pulmonary vascular congestion. 3.  No acute infiltrates.  Critical Value/emergent results were called by telephone at the time of interpretation on 11/12/2012 at 11:48 a.m. to Mercy Moore, RN, who verbally acknowledged these results.   Original Report Authenticated By: Francene Boyers,  M.D.   Dg Abd Decub  11/12/2012   *RADIOLOGY REPORT*  Clinical Data: Abdominal pain and nausea.  Question free intraperitoneal air.  ABDOMEN - 1 VIEW DECUBITUS  Comparison: Plain films abdomen 11/11/2012.  Findings: There is free intraperitoneal air.  IMPRESSION: Study is positive for free intraperitoneal air consistent with bowel perforation.  Critical Value/emergent results were called by telephone at the time of interpretation on 11/12/2012 at 12:53 p.m. to the patient's nurse, Bernette Redbird, who verbally acknowledged these results.   Original Report Authenticated By: Holley Dexter, M.D.    Scheduled Meds: . albuterol  2.5 mg Nebulization Q6H  . antiseptic oral rinse  15 mL Mouth Rinse Q4H  . enoxaparin (LOVENOX) injection  40 mg Subcutaneous Q24H  . feeding supplement  1 Container Oral Daily  . folic acid  1 mg Oral Daily  . insulin aspart  0-20 Units Subcutaneous TID AC & HS  . labetalol  100 mg Oral TID  . levothyroxine  25 mcg Oral QAC breakfast  . lip balm  1 application Topical BID  . multivitamin with minerals  1 tablet Oral Daily  . pantoprazole  40 mg Oral Daily  . QUEtiapine  25 mg Oral QHS  . sodium chloride  10-40 mL Intracatheter Q12H  . 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 If 8PM-8AM, please contact night-coverage at [email or web address], password [facility] 11/28/2012, 8:37 AM  LOS: 17 days

## 2012-11-29 LAB — COMPREHENSIVE METABOLIC PANEL
ALT: 28 U/L (ref 0–35)
BUN: 19 mg/dL (ref 6–23)
CO2: 25 mEq/L (ref 19–32)
Calcium: 7.3 mg/dL — ABNORMAL LOW (ref 8.4–10.5)
Creatinine, Ser: 0.92 mg/dL (ref 0.50–1.10)
GFR calc Af Amer: 75 mL/min — ABNORMAL LOW (ref >90)
GFR calc non Af Amer: 64 mL/min — ABNORMAL LOW (ref >90)
Glucose, Bld: 94 mg/dL (ref 70–99)
Total Protein: 6.6 g/dL (ref 6.0–8.3)

## 2012-11-29 LAB — GLUCOSE, CAPILLARY

## 2012-11-29 LAB — CBC
HCT: 25.7 % — ABNORMAL LOW (ref 36.0–46.0)
Hemoglobin: 8 g/dL — ABNORMAL LOW (ref 12.0–15.0)
MCHC: 31.1 g/dL (ref 30.0–36.0)
RBC: 2.72 MIL/uL — ABNORMAL LOW (ref 3.87–5.11)
WBC: 8.5 10*3/uL (ref 4.0–10.5)

## 2012-11-29 MED ORDER — FUROSEMIDE 10 MG/ML IJ SOLN
20.0000 mg | Freq: Every day | INTRAMUSCULAR | Status: DC
  Administered 2012-11-29 – 2012-11-30 (×2): 20 mg via INTRAVENOUS
  Filled 2012-11-29 (×2): qty 2

## 2012-11-29 MED ORDER — LEVOTHYROXINE SODIUM 25 MCG PO TABS
25.0000 ug | ORAL_TABLET | ORAL | Status: DC
  Administered 2012-11-30 – 2012-12-03 (×4): 25 ug via ORAL
  Filled 2012-11-29 (×5): qty 1

## 2012-11-29 MED ORDER — POTASSIUM CHLORIDE CRYS ER 20 MEQ PO TBCR
40.0000 meq | EXTENDED_RELEASE_TABLET | Freq: Once | ORAL | Status: AC
  Administered 2012-11-29: 40 meq via ORAL
  Filled 2012-11-29: qty 2

## 2012-11-29 MED ORDER — POTASSIUM CHLORIDE 10 MEQ/100ML IV SOLN
10.0000 meq | INTRAVENOUS | Status: DC
  Administered 2012-11-29 (×2): 10 meq via INTRAVENOUS
  Filled 2012-11-29 (×3): qty 100

## 2012-11-29 MED ORDER — CHOLESTYRAMINE LIGHT 4 G PO PACK
4.0000 g | PACK | Freq: Two times a day (BID) | ORAL | Status: DC
  Administered 2012-11-29 – 2012-12-03 (×9): 4 g via ORAL
  Filled 2012-11-29 (×11): qty 1

## 2012-11-29 MED ORDER — LOPERAMIDE HCL 2 MG PO CAPS
2.0000 mg | ORAL_CAPSULE | ORAL | Status: DC | PRN
  Administered 2012-11-29: 2 mg via ORAL
  Filled 2012-11-29: qty 1

## 2012-11-29 MED ORDER — FUROSEMIDE 10 MG/ML IJ SOLN: 40.0000 mg | Freq: Once | INTRAMUSCULAR | Status: DC

## 2012-11-29 NOTE — Progress Notes (Signed)
SLP Cancellation Note  Patient Details Name: [name] MRN: [id number] DOB: [birth year]   Cancelled treatment:  ST to f/u for possible diet advancement on 11/30/12. Moreen Fowler MS, CCC-SLP [phone] [facility] 11/29/2012, 10:13 AM

## 2012-11-29 NOTE — Progress Notes (Signed)
15 Days Post-Op   Assessment: s/p Procedure(s): Washout Abdominal Wound, debridement of Fascia, Partial Closure Patient Active Problem List   Diagnosis Date Noted  . Delirium 11/24/2012  . Acute respiratory failure 11/13/2012  . Septic shock(785.52) 11/13/2012  . Aspiration pneumonia 11/12/2012  . Dyspnea 11/12/2012  . Cough 11/12/2012  . Wheezing 11/12/2012  . Hypoglycemia 11/12/2012  . Transaminitis 11/12/2012  . Alcoholic hepatitis 11/12/2012  . Normocytic anemia 11/12/2012  . Ileus 11/12/2012  . Perforation of jejunum s/p primary repair 11/12/2012 11/12/2012  . Nausea & vomiting 11/11/2012  . Abdominal pain, generalized 11/11/2012  . Obesity 11/11/2012  . ARF (acute renal failure) 11/11/2012  . 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 Coagulopathy 11/11/2012  . Hyperlipidemia 11/11/2012  . Anxiety 11/11/2012  . Alcohol withdrawal 11/11/2012  . Increased bilirubin level 11/11/2012  . Gastrointestinal hemorrhage 09/25/2012  . Anemia 09/25/2012  . Alcohol abuse 09/18/2012  . Hyponatremia 09/18/2012  . Thrombocytopenia 09/18/2012  . Elevated INR 09/18/2012  . Anemia due to blood loss, acute 01/24/2012  . HYPERGLYCEMIA 10/14/2008  . DIVERTICULOSIS OF COLON 07/20/2007  . HYPERTENSION 01/30/2007  . 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 CEREBROVASCULAR ACCIDENT, HX OF 01/30/2007    Continues to make progress, diarrhea persists, mild hypokalemia  Plan: Will give supplemental KCl this am and recheck in am. Wait to advance diet for SLP eval. Will try some questran and Imodium for loose stools  Subjective: No pain, feels better and taking PO's OK. Still with loose stools (C Diff negative)  Objective: Vital signs in last 24 hours: Temp:  [97.4 F (36.3 C)-98.1 F (36.7 C)] 97.4 F (36.3 C) (05/25 1610) Pulse Rate:  [72-84] 72 (05/25 0633) Resp:  [18-20] 20 (05/25 0633) BP: (96-99)/(55-56) 97/56 mmHg (05/25 0633) SpO2:  [92 %-98 %] 95 %  (05/25 0844)   Intake/Output from previous day: 05/24 0701 - 05/25 0700 In: 240 [P.O.:240] Out: 625 [Urine:625]  General appearance: alert, cooperative and no distress Resp: clear to auscultation bilaterally GI: Still distended/soft BS+ loose stool in bed this am  Incision: stable/clean  Lab Results:   Recent Labs  11/28/12 0425 11/29/12 0500  WBC 8.5 8.5  HGB 8.0* 8.0*  HCT 26.0* 25.7*  PLT 171 172   BMET  Recent Labs  11/27/12 0420 11/29/12 0500  NA 141 139  K 3.5 3.1*  CL 106 107  CO2 28 25  GLUCOSE 100* 94  BUN 26* 19  CREATININE 1.03 0.92  CALCIUM 7.4* 7.3*    MEDS, Scheduled . albuterol  2.5 mg Nebulization Q6H  . antiseptic oral rinse  15 mL Mouth Rinse Q4H  . enoxaparin (LOVENOX) injection  40 mg Subcutaneous Q24H  . feeding supplement  1 Container Oral Daily  . folic acid  1 mg Oral Daily  . insulin aspart  0-20 Units Subcutaneous TID AC & HS  . labetalol  100 mg Oral TID  . levothyroxine  25 mcg Oral QAC breakfast  . lip balm  1 application Topical BID  . multivitamin with minerals  1 tablet Oral Daily  . pantoprazole  40 mg Oral Daily  . QUEtiapine  25 mg Oral QHS  . sodium chloride  10-40 mL Intracatheter Q12H  . thiamine  100 mg Oral Daily    Studies/Results: Ct Soft Tissue Neck W Contrast  11/28/2012   *RADIOLOGY REPORT*  Clinical Data: Previous intubation.  Hoarseness.  Abnormal phonation.  CT NECK WITH CONTRAST  Technique:  Multidetector CT imaging of  the neck was performed with intravenous contrast.  Contrast: OMNIPAQUE IOHEXOL 300 MG/ML  SOLN  Comparison: None.  Findings: Limited visualization of the intracranial contents does not show any acute finding.  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  Lung apices show an effusion on the left with dependent atelectasis.  Both parotid glands are normal.  Both submandibular glands are normal.  The thyroid gland is normal.  There are no enlarged lymph nodes on either side of the  neck.  Arterial and venous structures are patent.  There is atherosclerotic disease of both carotid bifurcations.  No mucosal or submucosal mass lesion is seen.  The region of the glottis appears normal.  There is the suggestive appearance of an abnormal outpouching of the trachea at the level of the thoracic inlet extending anteriorly. However, I believe this is due to patient motion as there is also a duplication of some other anatomical findings such as the lung apices.  This would be a distinctly unusual tracheal abnormality.  IMPRESSION: I do not believe the CT scan shows an explanation for the patient's abnormal phonation.  There is the appearance of an outpouching in the trachea at the thoracic inlet, but I believe this is artifactual and related to patient motion during the scan.  See above discussion.   Original Report Authenticated By: Paulina Fusi, M.D.   Dg Chest Port 1 View  11/27/2012   *RADIOLOGY REPORT*  Clinical Data: Pneumonia.  Shortness of breath for 3 weeks. History of hypertension  PORTABLE CHEST - 1 VIEW  Comparison: 11/25/2012  Findings: A right subclavian CVP is stable in position.  Heart and mediastinal contours are unchanged with mitral annulus calcification and aortic calcification again noted.  The lung fields are notable for persistent but improved pulmonary vascular congestion.  No overt congestive failure is suggested. Focal density at the left base may be the result of atelectasis or focal residual basilar alveolar edema but an area of focal infiltrate is not excluded.  Small bilateral pleural effusions are again noted.  Focal subsegmental atelectasis is seen in the left lung zone.  IMPRESSION: Interval improvement in pulmonary vascular congestion and edema pattern.  Left lower lobe density, question atelectasis, residual focal alveolar edema versus infiltrate.   Original Report Authenticated By: Rhodia Albright, M.D.      LOS: 18 days     Currie Paris, MD,  [facility], [place] [phone]   11/29/2012 8:53 AM

## 2012-11-29 NOTE — Progress Notes (Signed)
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   Coagulopathy   Hyperlipidemia   Anxiety   Alcohol withdrawal   Increased bilirubin level   Aspiration pneumonia   Dyspnea   Cough   Wheezing   Hypoglycemia   Transaminitis   Alcoholic hepatitis   Normocytic anemia   Ileus   Acute respiratory failure   Septic shock(785.52)   Delirium   WL4 Assumed care on 5/23   Respiratory failure-postoperative  Status post extubation on 5/18  Oxygen requirements stable  Started on Lovenox for DVT prophylaxis  Lovenox was being held because of thrombocytopenia , but thrombocytopenia has resolved therefore resumed Still has some wheezing  Started on nebulizer treatments every 6 hours  Given hoarseness and shortness of breath and unable to speak in full sentences Dr. Jenne Pane from ENT has been consulted, CT of the neck down but not very helpful  Sepsis/septic shock secondary to peritonitis  Off vasopressors  last echo in 2007 showed EF of 55-65% with some mitral valve calcification  Acute kidney injury resolved, continue diuresis    Severe protein calorie malnutrition We'll order nutrition consult   Alcoholic cirrhosis and Acute alcoholic hepatitis.  Transaminitis - improved    Perforated viscus in jejunem s/p repair 5/8.  s/p Procedure(s):  [place] Abdominal Wound, debridement of Fascia, Partial Closure  NG tube removed  Nutrition consult  Wound care per CC S.  Outpatient GI followup  Off antibiotics    Protein malnutrition  Coagulopathy/thrombocytopenia platelet count improving   Hypothyroidism continue  Synthroid   Acute encephalopathy resolved-continue thiamine folate secondary to alcohol abuse history, born benzodiazepines, continue cerebral     Code Status: full  Family Communication: family updated about patient's clinical progress  Disposition Plan: Continue PT OT evaluation , will likely need SNF Tuesday  BRIEF PATIENT DESCRIPTION: 67 yo with alcoholic liver disease was admitted on 5/7 with abdominal pain and was found on 5/8 to have a small bowel perforation. She was taken to the OR and returned to the ICU with an open wound and mechanically ventilated.  SIGNIFICANT EVENTS / STUDIES:  5/7 Admitted with abdominal pain, nausea, vomiting  5/7 OR >>> Perforated viscus, peritonitis, lysis of adhesions, small bowel repair  5/16 Reintubated r/t complete left sided collapse, increased WBC  5/18 Extubated, WBC decreased, TF started  5/20 Speech assessment >> D1 diet  5/22 D/c TPN, transfer to non tele floor bed  LINES / TUBES:  OETT 5/8 >>>5/15>>>Reintubated 5/16>>> 5/18  NGT 5/8 >>> 5/20  Foley 5/8 >>>  R rad A-line 5/8 >>>5/12>>>5/16>>>5/18  L IJ CVL 5/8 >>>5/19  R Brach PICC>>>5/19  CULTURES:  5/8 Blood >>>negative  5/7 Urine >>>negative  Blood 5/15>>>neg  Urine 5/15>>>neg  Sputum 5/15>>>neg  Sputum via bronch 5/16>>> Neg  ANTIBIOTICS:  Aztreonam 5/8 >>>5/9  Flagyl 5/8 >>>5/9  Levaquin 5/8 x 1  Cipro 5/8 x 1  ertapenem 5/9>>>5/16  Vanc 5/15 >>>5/19  Primaxin 5/16 >>>5/21  Interval events:  Still has the hoarseness, less short of breath today No acute complaints this morning  Objective: Filed Vitals:   11/28/12 1935 11/28/12 2131 11/29/12 4696 11/29/12 2952  BP:  96/55 97/56   Pulse:  84 72   Temp:  98.1 F (36.7 C) 97.4 F (36.3 C)   TempSrc:  Oral Oral   Resp:  18 20   Height:      Weight:      SpO2: 97% 98% 95% 95%    Intake/Output Summary (Last 24 hours) at 11/29/12 1104 Last data filed at 11/29/12 0636  Gross per 24 hour  Intake    180 ml  Output     625 ml  Net   -445 ml    Exam:  HENT:  Head: Atraumatic.  Nose: Nose normal.  Mouth/Throat: Oropharynx is clear and moist.  Eyes: Conjunctivae are normal. Pupils are equal, round, and reactive to light. No scleral icterus.  Neck: Neck supple. No tracheal deviation present.  Cardiovascular: Normal rate, regular rhythm, normal heart sounds and intact distal pulses.  Pulmonary/Chest: Effort normal and breath sounds normal. No respiratory distress.  Abdominal: Soft. Normal appearance and bowel sounds are normal. She exhibits no distension. There is no tenderness.  Musculoskeletal: She exhibits no edema and no tenderness.  Neurological: She is alert. No cranial nerve deficit.    Data Reviewed: Basic Metabolic Panel:  Recent Labs Lab 11/23/12 0430 11/24/12 0400 11/25/12 0340 11/26/12 0507 11/27/12 0420 11/29/12 0500  NA 139 139 139 141 141 139  K 3.5 3.5 3.7 3.6 3.5 3.1*  CL 104 103 105 107 106 107  CO2 30 32 30 29 28 25   GLUCOSE 157* 162* 130* 112* 100* 94  BUN 25* 24* 23 25* 26* 19  CREATININE 0.80 0.76 0.74 0.79 1.03 0.92  CALCIUM 7.9* 7.7* 7.8* 7.8* 7.4* 7.3*  MG 1.8 2.1 2.2 2.2  --   --   PHOS 3.2  --   --  3.8  --   --     Liver Function Tests:  Recent Labs Lab 11/23/12 0430 11/26/12 0507 11/29/12 0500  AST 104* 75* 76*  ALT 36* 29 28  ALKPHOS 149* 160* 154*  BILITOT 3.8* 3.2* 4.3*  PROT 5.9* 6.3 6.6  ALBUMIN 1.3* 1.2* 1.1*   No results found for this basename: LIPASE, AMYLASE,  in the last 168 hours  Recent Labs Lab 11/23/12 1200  AMMONIA 32    CBC:  Recent Labs Lab 11/23/12 0430 11/24/12 0400 11/25/12 0340 11/27/12 0420 11/28/12 0425 11/29/12 0500  WBC 14.4* 15.1* 14.0* 8.2 8.5 8.5  NEUTROABS 12.4*  --   --   --   --   --   HGB 8.5* 8.7* 8.5* 8.1* 8.0* 8.0*  HCT 27.3* 28.1* 27.1* 25.1* 26.0* 25.7*  MCV 94.8 94.3 94.8 94.7 94.5 94.5  PLT 116* 151 153 177 171 172    Cardiac Enzymes: No results found for this basename: CKTOTAL, CKMB,  CKMBINDEX, TROPONINI,  in the last 168 hours BNP (last 3 results) No results found for this basename: PROBNP,  in the last 8760 hours   CBG:  Recent Labs Lab 11/28/12 0848 11/28/12 1218 11/28/12 1726 11/28/12 2135 11/29/12 0754  GLUCAP 108* 105* 135* 129* 95    Recent Results (from the past 240 hour(s))  CULTURE, RESPIRATORY (NON-EXPECTORATED)     Status: None   Collection Time    11/20/12 10:56 AM      Result Value Range Status   Specimen Description TRACHEAL ASPIRATE   Final   Special Requests Normal   Final   Gram Stain     Final   Value: FEW WBC PRESENT,BOTH PMN  AND MONONUCLEAR     NO SQUAMOUS EPITHELIAL CELLS SEEN     NO ORGANISMS SEEN   Culture Non-Pathogenic Oropharyngeal-type Flora Isolated.   Final   Report Status 11/22/2012 FINAL   Final  CLOSTRIDIUM DIFFICILE BY PCR     Status: None   Collection Time    11/27/12  3:20 AM      Result Value Range Status   C difficile by pcr NEGATIVE  NEGATIVE Final     Studies: Abd 1 View (kub)  11/11/2012   *RADIOLOGY REPORT*  Clinical Data: Abdominal pain with nausea and vomiting.  Reduced bowel sounds.  ABDOMEN - 1 VIEW  Comparison: 10/05/2012  Findings: There is a ovoid shaped gas structure in the midline which likely represents stomach.  Inferior and lateral to this there are moderately distended bowel loops which could represent small bowel or colon.  A similar appearance was noted on the prior exam from 10/05/2012, although the bowel loops are more distended at that time. A linear radiopaque density which was previously located in the right upper quadrant  now projects over the left lower quadrant. A more subtle left lower quadrant radiopaque density located along the left panniculus remains unchanged.  IMPRESSION: Gas-filled mid abdominal loops are not as distended as previously noted, but mildly abnormal. Findings consistent with ileus although early obstruction not excluded.  Previously noted right upper quadrant radiopaque  density has transited to the left side of the abdomen.   Original Report Authenticated By: Davonna Belling, M.D.   Ct Soft Tissue Neck W Contrast  11/28/2012   *RADIOLOGY REPORT*  Clinical Data: Previous intubation.  Hoarseness.  Abnormal phonation.  CT NECK WITH CONTRAST  Technique:  Multidetector CT imaging of the neck was performed with intravenous contrast.  Contrast: OMNIPAQUE IOHEXOL 300 MG/ML  SOLN  Comparison: None.  Findings: Limited visualization of the intracranial contents does not show any acute finding.  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  Lung apices show an effusion on the left with dependent atelectasis.  Both parotid glands are normal.  Both submandibular glands are normal.  The thyroid gland is normal.  There are no enlarged lymph nodes on either side of the neck.  Arterial and venous structures are patent.  There is atherosclerotic disease of both carotid bifurcations.  No mucosal or submucosal mass lesion is seen.  The region of the glottis appears normal.  There is the suggestive appearance of an abnormal outpouching of the trachea at the level of the thoracic inlet extending anteriorly. However, I believe this is due to patient motion as there is also a duplication of some other anatomical findings such as the lung apices.  This would be a distinctly unusual tracheal abnormality.  IMPRESSION: I do not believe the CT scan shows an explanation for the patient's abnormal phonation.  There is the appearance of an outpouching in the trachea at the thoracic inlet, but I believe this is artifactual and related to patient motion during the scan.  See above discussion.   Original Report Authenticated By: Paulina Fusi, M.D.   Ct Abdomen Pelvis W Contrast  11/19/2012   *RADIOLOGY REPORT*  Clinical Data: Abdominal pain, leukocytosis, recent surgery for perforated small bowel  CT ABDOMEN AND PELVIS WITH CONTRAST  Technique:  Multidetector CT imaging of the abdomen and pelvis was  performed following the standard protocol during bolus administration of intravenous contrast.  Contrast: OMNIPAQUE IOHEXOL 300 MG/ML  SOLN  Comparison: 09/18/2012  Findings: Motion degraded  images.  Mild patchy opacity in the right lower lobe (series 5/image 7), suspicious for pneumonia.  Additional patchy opacity in the left lower lobe (series 5/image 8), suspicious for pneumonia, less likely compressive atelectasis.  Right basilar opacity, likely atelectasis.  Small left and trace right pleural effusions.  Cirrhotic configuration of the liver.  1.9 x 1.5 cm hypoenhancing lesion in the lateral segment left hepatic lobe, previously favored to reflect a benign hemangioma although indeterminate, unchanged from 2013.  Spleen, pancreas, and adrenal glands are within normal limits.  Small layering gallstones (series 2/image 38).  No associated inflammatory changes by CT.  Kidneys are unremarkable.  No renal calculi or hydronephrosis.  No evidence of bowel obstruction.  No contrast extravasation to suggest leak/perforation following surgery.  Colonic diverticulosis, without associate inflammatory changes.  No free air.  Atherosclerotic calcifications of the abdominal aorta and branch vessels. Shunt/collateral vessel in the left mid abdomen (series 2/image 60), between the portosplenic confluence and the IVC. Portal vein is patent.  Moderate abdominopelvic ascites.  Uterus is mildly heterogeneous, possibly reflecting uterine fibroids.  No adnexal masses.  Bladder decompressed by indwelling Foley catheter.  Postsurgical changes in the midline anterior abdominal wall.  Body wall edema.  Degenerative changes of the visualized thoracolumbar spine.  IMPRESSION: No oral contrast extravasation to suggest leak/perforation.  No free air.  Cirrhosis.  Venous collaterals between the portable at confluence in the IVC.  Portal vein remains patent.  1.9 x 1.5 cm hypoenhancing lesion in the lateral segment left hepatic lobe,  indeterminate, unchanged since 2013.  Correlate with serum AFP and consider follow-up MRI abdomen with/without contrast for further characterization as an outpatient.  Moderate abdominopelvic ascites.  Body wall edema.  Suspected right middle lobe and possible left lower lobe pneumonia. Small left and trace right pleural effusions.  Additional ancillary findings as above.   Original Report Authenticated By: Charline Bills, M.D.   Dg Chest Port 1 View  11/27/2012   *RADIOLOGY REPORT*  Clinical Data: Pneumonia.  Shortness of breath for 3 weeks. History of hypertension  PORTABLE CHEST - 1 VIEW  Comparison: 11/25/2012  Findings: A right subclavian CVP is stable in position.  Heart and mediastinal contours are unchanged with mitral annulus calcification and aortic calcification again noted.  The lung fields are notable for persistent but improved pulmonary vascular congestion.  No overt congestive failure is suggested. Focal density at the left base may be the result of atelectasis or focal residual basilar alveolar edema but an area of focal infiltrate is not excluded.  Small bilateral pleural effusions are again noted.  Focal subsegmental atelectasis is seen in the left lung zone.  IMPRESSION: Interval improvement in pulmonary vascular congestion and edema pattern.  Left lower lobe density, question atelectasis, residual focal alveolar edema versus infiltrate.   Original Report Authenticated By: Rhodia Albright, M.D.   Dg Chest Port 1 View  11/25/2012   *RADIOLOGY REPORT*  Clinical Data: Shortness of breath.  PORTABLE CHEST - 1 VIEW  Comparison: 11/24/2012.  Findings: The right PICC line is stable.  The NG tube has been removed.  Persistent low lung volumes with vascular crowding, atelectasis, vascular congestion and small effusions.  IMPRESSION:  1.  Removal of NG tube. 2.  Persistent vascular congestion, edema, atelectasis and effusions.   Original Report Authenticated By: Rudie Meyer, M.D.   Dg Chest Port  1 View  11/24/2012   *RADIOLOGY REPORT*  Clinical Data: Follow up atelectasis.  PORTABLE CHEST - 1 VIEW  Comparison: 11/23/2012.  Findings: The NG tube is stable.  The left IJ catheter has been removed. A new right PICC line is in good position with the tip near the cavoatrial junction.  The heart lungs appears stable with persistent low lung volumes, vascular crowding, atelectasis and effusions.  IMPRESSION:  1.  Removal of left IJ catheter. 2.  New right-sided PICC line with tip near the cavoatrial junction. 3.  Persistent low lung volumes with vascular crowding, atelectasis, vascular congestion and probable small effusions.   Original Report Authenticated By: Rudie Meyer, M.D.   Dg Chest Port 1 View  11/23/2012   *RADIOLOGY REPORT*  Clinical Data: Status post extubation  PORTABLE CHEST - 1 VIEW  Comparison: 11/22/2012  Findings: The cardiac shadow is stable.  The endotracheal tube has been removed.  The nasogastric catheter and left-sided central venous line remain in place. The lungs are well-aerated again demonstrates some mild bibasilar changes.  IMPRESSION: Interval extubation.  The remainder of the exam is stable from prior study.   Original Report Authenticated By: Alcide Clever, M.D.   Dg Chest Port 1 View  11/22/2012   *RADIOLOGY REPORT*  Clinical Data: Evaluate collapse.  History stroke.  Hypertension.  PORTABLE CHEST - 1 VIEW  Comparison: 11/21/2012  Findings: Endotracheal tube appropriately position with tip 3 cm above carina.  Nasogastric tube looped in stomach with tip below the inferior aspect of film.  Left IJ central line terminates at the cavoatrial junction.  Cardiomegaly accentuated by AP portable technique.  Atherosclerosis in the transverse aorta.  Persistent small left pleural effusion. No pneumothorax.  Low lung volumes and mild interstitial edema, similar.  No change in left greater than right bibasilar airspace disease.  IMPRESSION: No significant change since one day prior.   Bibasilar atelectasis versus infection, greater left than right.  Low lung volumes with mild interstitial edema and persistent small left pleural effusion.  Age advanced aortic atherosclerosis.   Original Report Authenticated By: Jeronimo Greaves, M.D.   Portable Chest Xray In Am  11/21/2012   *RADIOLOGY REPORT*  Clinical Data: New.  PORTABLE CHEST - 1 VIEW  Comparison: 5/16, 5/15, and 11/18/2012  Findings: There is been partial clearing of the infiltrates at both bases.  Heart size and vascularity are normal.  Endotracheal tube and central venous catheter and NG tube are unchanged.  IMPRESSION: Improvement in the bibasilar infiltrates.   Original Report Authenticated By: Francene Boyers, M.D.   Portable Chest Xray  11/20/2012   *RADIOLOGY REPORT*  Clinical Data: Endotracheal tube placement.  PORTABLE CHEST - 1 VIEW  Comparison: 11/20/2012 5:00 a.m. and 11/19/2012.  Findings: Endotracheal tube has been placed with the tip 3.5 cm above the carina.  Improved aeration of the left lung with residual left base atelectasis and / or left-sided pleural effusion. Decrease in degree of midline shift to the left.  Left base infiltrate would be difficult to exclude with the presence of atelectasis.  Left central line tip distal superior vena cava level.  No gross pneumothorax.  Pulmonary vascular congestion/pulmonary edema most notable centrally.  Nasogastric tube courses below the diaphragm.  The tip is not included on this exam.  Heart size difficult to adequately assessed.  Mitral valve calcifications.  Calcification of the aorta.  IMPRESSION: Endotracheal tube has been placed with the tip 3.5 cm above the carina.  Improved aeration of the left lung with residual left base atelectasis and / or left-sided pleural effusion. Decrease in degree of midline shift to the left.  Pulmonary vascular congestion/  pulmonary edema most notable centrally.  This is a call report.   Original Report Authenticated By: Lacy Duverney, M.D.   Dg  Chest Port 1 View  11/20/2012   *RADIOLOGY REPORT*  Clinical Data: Evaluate endotracheal tube placement.  PORTABLE CHEST - 1 VIEW  Comparison: 11/19/2012  Findings: Endotracheal tube has been removed.  There is now complete opacification of the left hemithorax.  Findings are most compatible with volume loss and mucous plugging.  Mediastinal shift towards the left.  Nasogastric tube extends into the abdomen.  Left jugular central line is in the SVC region.  Streaky densities at the right lung base are most compatible atelectasis.  IMPRESSION: Complete opacification of the left hemithorax is most compatible with volume loss and mucous plugging.  These results were called by telephone on 11/20/2012 at 01/1943 and to the patient's nurse, Kennyth Arnold, who verbally acknowledged these results.   Original Report Authenticated By: Richarda Overlie, M.D.   Dg Chest Port 1 View  11/19/2012   *RADIOLOGY REPORT*  Clinical Data: Pulmonary vascular congestion.  Endotracheal tube placement.  PORTABLE CHEST - 1 VIEW  Comparison: 11/18/2012  Findings: Endotracheal tube, central venous catheter, and NG tube all appear in good position.  Pulmonary vascular congestion persists.  Increasing density at the left base is probably an effusion.  Slight atelectasis at the right base is unchanged.  IMPRESSION: Increasing left pleural effusion.  Persistent pulmonary vascular congestion and slight right base atelectasis.   Original Report Authenticated By: Francene Boyers, M.D.   Dg Chest Port 1 View  11/18/2012   *RADIOLOGY REPORT*  Clinical Data: Endotracheal tube placement  PORTABLE CHEST - 1 VIEW  Comparison: 11/17/2012  Findings: 0453 hours.  Endotracheal tube tip is 3.2 cm above the base of the carina.  The left IJ central venous catheter tip overlies the mid to lower SVC.  Lung volumes are low. The cardiopericardial silhouette is enlarged.  Vascular congestion noted without overt airspace pulmonary edema.  There is slight improvement in basilar  lung aeration. The NG tube passes into the stomach although the distal tip position is not included on the film. Telemetry leads overlie the chest.  IMPRESSION: Low volume film with cardiomegaly vascular congestion.  Slight improvement of bibasilar collapse / consolidation.   Original Report Authenticated By: Kennith Center, M.D.   Dg Chest Port 1 View  11/17/2012   *RADIOLOGY REPORT*  Clinical Data: Status post repair of small bowel perforation. Ventilated patient.  PORTABLE CHEST - 1 VIEW  Comparison: Chest 11/16/2012.  Findings: Support tubes and lines are unchanged.  Bibasilar airspace opacities persist and appear worsened on the right.  No pneumothorax identified.  Heart size is normal.  IMPRESSION: Some worsening of airspace disease in the right lung base.  No change on the left.   Original Report Authenticated By: Holley Dexter, M.D.   Dg Chest Port 1 View  11/16/2012   *RADIOLOGY REPORT*  Clinical Data: Endotracheal placement  PORTABLE CHEST - 1 VIEW  Comparison: 11/15/2012  Findings: Endotracheal tube has its tip 2.5 cm above the carina. Nasogastric tube enters the stomach.  Left internal jugular central line has its tip at the SVC/RA junction.  Heart size is stable. Patchy density in both lung bases persist consistent atelectasis and/or pneumonia.  No new finding.  IMPRESSION: Lines and tubes grossly well positioned.  Lower lobe atelectasis and/or pneumonia persists bilaterally.   Original Report Authenticated By: Paulina Fusi, M.D.   Dg Chest Port 1 View  11/15/2012   *RADIOLOGY REPORT*  Clinical Data: Intubated patient.  PORTABLE CHEST - 1 VIEW  Comparison: The tip in the portable chest 11/14/2012.  Findings: Support tubes and lines are unchanged.  There has been some increase in right basilar atelectasis.  Small left effusion and basilar airspace disease are unchanged.  No pneumothorax.  IMPRESSION:  1.  Support apparatus unchanged and in good position. 2.  Increased right basilar atelectasis.  3.  No marked change in a small left effusion and basilar airspace disease.   Original Report Authenticated By: Holley Dexter, M.D.   Dg Chest Port 1 View  11/14/2012   *RADIOLOGY REPORT*  Clinical Data: Intubated patient.  PORTABLE CHEST - 1 VIEW  Comparison: Chest 11/12/2012.  Findings: Support tubes and lines are unchanged.  Left basilar airspace disease persists.  Right lung appears clear.  No pneumothorax.  Heart size upper normal.  IMPRESSION: No change in left basilar airspace disease.  No new abnormality.   Original Report Authenticated By: Holley Dexter, M.D.   Dg Chest Port 1 View  11/12/2012   *RADIOLOGY REPORT*  Clinical Data: Evaluate central line placement.  PORTABLE CHEST - 1 VIEW  Comparison: 11/12/2012  Findings: Endotracheal tube is 3.8 cm above the carina.  Left jugular central line tip is in the lower SVC region.  No evidence for a pneumothorax.  Persistent left basilar densities could represent atelectasis and cannot exclude pleural fluid.  The nasogastric tube extends into the abdomen.  Stable appearance of the heart.  IMPRESSION: Central line tip in the lower SVC region.  Negative for a pneumothorax.  Stable basilar densities.   Original Report Authenticated By: Richarda Overlie, M.D.   Dg Chest Port 1 View  11/12/2012   *RADIOLOGY REPORT*  Clinical Data: Perforated jejunum.  Peritonitis.  PORTABLE CHEST - 1 VIEW 5:34 p.m.  Comparison: 11/12/2012 at 11:35 a.m.  Findings: Endotracheal tube is in good position 3 cm above the carina.  NG tube tip is in the stomach.  Heart size and vascularity are normal.  There is slight atelectasis in the left upper lobe medially.  Scarring at the left base.  Dense calcification in the mitral valve annulus.  IMPRESSION: New atelectasis in the left upper lobe medially.   Original Report Authenticated By: Francene Boyers, M.D.   Dg Chest Port 1 View  11/12/2012   *RADIOLOGY REPORT*  Clinical Data: Cough and shortness of breath.  Fever.  Nausea and vomiting.   PORTABLE CHEST - 1 VIEW  Comparison: Chest x-rays dated 10/05/2012 and 01/20/2012  Findings: There is suggestion of free air under the right hemidiaphragm.  I recommend a left lateral decubitus view of the abdomen for further evaluation of this possibility.  There is chronic cardiomegaly with scarring at the left lung base laterally.  Dense calcification in the mitral valve annulus.  There is slight pulmonary vascular congestion.  No infiltrates.  IMPRESSION:  1.  Possible free air under the right hemidiaphragm.  Left lateral decubitus view of the abdomen recommended for further evaluation. 2.  Pulmonary vascular congestion. 3.  No acute infiltrates.  Critical Value/emergent results were called by telephone at the time of interpretation on 11/12/2012 at 11:48 a.m. to Mercy Moore, RN, who verbally acknowledged these results.   Original Report Authenticated By: Francene Boyers, M.D.   Dg Abd Decub  11/12/2012   *RADIOLOGY REPORT*  Clinical Data: Abdominal pain and nausea.  Question free intraperitoneal air.  ABDOMEN - 1 VIEW DECUBITUS  Comparison: Plain films abdomen 11/11/2012.  Findings: There is free intraperitoneal  air.  IMPRESSION: Study is positive for free intraperitoneal air consistent with bowel perforation.  Critical Value/emergent results were called by telephone at the time of interpretation on 11/12/2012 at 12:53 p.m. to the patient's nurse, Bernette Redbird, who verbally acknowledged these results.   Original Report Authenticated By: Holley Dexter, M.D.    Scheduled Meds: . albuterol  2.5 mg Nebulization Q6H  . antiseptic oral rinse  15 mL Mouth Rinse Q4H  . cholestyramine light  4 g Oral BID  . enoxaparin (LOVENOX) injection  40 mg Subcutaneous Q24H  . feeding supplement  1 Container Oral Daily  . folic acid  1 mg Oral Daily  . insulin aspart  0-20 Units Subcutaneous TID AC & HS  . labetalol  100 mg Oral TID  . [START ON 11/30/2012] levothyroxine  25 mcg Oral Q24H  . lip balm  1 application Topical  BID  . multivitamin with minerals  1 tablet Oral Daily  . pantoprazole  40 mg Oral Daily  . potassium chloride  10 mEq Intravenous Q1 Hr x 3  . QUEtiapine  25 mg Oral QHS  . sodium chloride  10-40 mL Intracatheter Q12H  . 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 If 8PM-8AM, please contact night-coverage at [email or web address], password [email or web address] 11/29/2012, 11:04 AM  LOS: 18 days

## 2012-11-30 LAB — GLUCOSE, CAPILLARY
Glucose-Capillary: 176 mg/dL — ABNORMAL HIGH (ref 70–99)
Glucose-Capillary: 123 mg/dL — ABNORMAL HIGH (ref 70–99)
Glucose-Capillary: 85 mg/dL (ref 70–99)

## 2012-11-30 LAB — BASIC METABOLIC PANEL
CO2: 24 mEq/L (ref 19–32)
Calcium: 7.4 mg/dL — ABNORMAL LOW (ref 8.4–10.5)
Creatinine, Ser: 1.05 mg/dL (ref 0.50–1.10)
GFR calc non Af Amer: 55 mL/min — ABNORMAL LOW (ref >90)
Glucose, Bld: 103 mg/dL — ABNORMAL HIGH (ref 70–99)

## 2012-11-30 LAB — CBC
Hemoglobin: 7.8 g/dL — ABNORMAL LOW (ref 12.0–15.0)
MCH: 30.4 pg (ref 26.0–34.0)
MCHC: 32.2 g/dL (ref 30.0–36.0)
MCV: 94.2 fL (ref 78.0–100.0)
Platelets: 175 10*3/uL (ref 150–400)
RBC: 2.57 MIL/uL — ABNORMAL LOW (ref 3.87–5.11)

## 2012-11-30 MED ORDER — LABETALOL HCL 100 MG PO TABS
100.0000 mg | ORAL_TABLET | Freq: Two times a day (BID) | ORAL | Status: DC
  Filled 2012-11-30 (×3): qty 1

## 2012-11-30 MED ORDER — FUROSEMIDE 20 MG PO TABS
20.0000 mg | ORAL_TABLET | Freq: Every day | ORAL | Status: DC
  Filled 2012-11-30 (×2): qty 1

## 2012-11-30 MED ORDER — POTASSIUM CHLORIDE 10 MEQ/100ML IV SOLN
10.0000 meq | INTRAVENOUS | Status: AC
  Administered 2012-11-30 (×4): 10 meq via INTRAVENOUS
  Filled 2012-11-30 (×4): qty 100

## 2012-11-30 NOTE — Consult Note (Signed)
Reason for Consult:hoarseness Referring Physician: Internal medicine  [name] [name] is an 67 y.o. female.  HPI: 67 year old female with history of alcohol abuse and recurring nausea/vomiting/abdominal pain who had previously undergone several abdominal surgeries presented to the ER 5/7 with severe nausea/vomiting/abdominal pain and was admitted to the hospital.  On the following day, she was found to have evidence of a bowel perforation and underwent exploratory laparotomy which included lysis of adhesions and repair of a jejunal perforation.  She was left intubated with an open abdomen which was closed two days later.  She remained intubated until 5/15 when she was able to be weaned to extubation.  Her chest x-ray showed collapse of the left lung after extubation so she was reintubated the following day and underwent therapeutic bronchoscopy.  There is no note made of any difficulty with reintubation.  She remained intubated on the ventilator until 5/18 when she was again extubated and remained off the ventilator thereafter.  Since extubation, she has been noted to have a hoarse, breathy voice and has had difficulty swallowing as followed and documented by the speech pathologist.  There has been no evaluation of the swallow or laryngopharyngeal structures beyond bedside exams.  She had a neck CT late last week which was not very revealing.  Her diet has been slowly advanced with precautions as aspiration is expected.  She reports that her voice is better at times.  She feels no problem with her swallow.  She denies throat pain.   Past Medical History  Diagnosis Date  . Hypertension   . H/O brain surgery   . Aneurysm of anterior cerebral artery   . Hernia   . Anxiety   . Stroke   . Anemia   . Arthritis     Past Surgical History  Procedure Laterality Date  . Laparotomy  01/22/2012    Procedure: EXPLORATORY LAPAROTOMY;  Surgeon: Emelia Loron, MD;  Location: [facility] OR;  Service: General;   Laterality: N/A;  lysis of adhesions  . Hernia repair    . Esophagogastroduodenoscopy N/A 09/19/2012    Procedure: ESOPHAGOGASTRODUODENOSCOPY (EGD);  Surgeon: Charna Makenna, MD;  Location: [facility] ENDOSCOPY;  Service: Endoscopy;  Laterality: N/A;  . Colonoscopy N/A 09/20/2012    Procedure: COLONOSCOPY;  Surgeon: Charna Jordan, MD;  Location: [facility] ENDOSCOPY;  Service: Endoscopy;  Laterality: N/A;  . Givens capsule study N/A 09/21/2012    Procedure: GIVENS CAPSULE STUDY;  Surgeon: Charna Chimere, MD;  Location: [facility] ENDOSCOPY;  Service: Endoscopy;  Laterality: N/A;  . Enteroscopy N/A 09/28/2012    Procedure: ENTEROSCOPY;  Surgeon: Rachael Fee, MD;  Location: [facility] ENDOSCOPY;  Service: Endoscopy;  Laterality: N/A;  . Upper gastrointestinal endoscopy  10/05/12  . Enteroscopy N/A 10/05/2012    Procedure: ENTEROSCOPY;  Surgeon: Meryl Dare, MD;  Location: [facility] ENDOSCOPY;  Service: Endoscopy;  Laterality: N/A;  . Laparotomy N/A 11/12/2012    Procedure: EXPLORATORY LAPAROTOMY  with repair perforated small bowel, abdominal wall debridement, application of wound vac dressing;  Surgeon: Ernestene Mention, MD;  Location: [facility] ORS;  Service: General;  Laterality: N/A;  . Wound debridement  11/14/2012    Procedure: Washout Abdominal Wound, debridement of Fascia, Partial Closure;  Surgeon: Ardeth Sportsman, MD;  Location: [facility] ORS;  Service: General;;    Family History  Problem Relation Age of Onset  . Cancer Father     pancreatic  . Cancer Maternal Grandmother     breast    Social History:  reports that  she has never smoked. She has never used smokeless tobacco. She reports that she drinks about 12.0 ounces of alcohol per week. She reports that she does not use illicit drugs.  Allergies:  Allergies  Allergen Reactions  . Etomidate     Myoclonus side effect pronounced  . Cephalexin Rash  . Penicillins Rash    Medications: I have reviewed the patient's current medications.  Results for orders placed during the hospital  encounter of 11/11/12 (from the past 48 hour(s))  GLUCOSE, CAPILLARY     Status: Abnormal   Collection Time    11/28/12 12:18 PM      Result Value Range   Glucose-Capillary 105 (*) 70 - 99 mg/dL  GLUCOSE, CAPILLARY     Status: Abnormal   Collection Time    11/28/12  5:26 PM      Result Value Range   Glucose-Capillary 135 (*) 70 - 99 mg/dL  GLUCOSE, CAPILLARY     Status: Abnormal   Collection Time    11/28/12  9:35 PM      Result Value Range   Glucose-Capillary 129 (*) 70 - 99 mg/dL   Comment 1 Notify RN    COMPREHENSIVE METABOLIC PANEL     Status: Abnormal   Collection Time    11/29/12  5:00 AM      Result Value Range   Sodium 139  135 - 145 mEq/L   Potassium 3.1 (*) 3.5 - 5.1 mEq/L   Chloride 107  96 - 112 mEq/L   CO2 25  19 - 32 mEq/L   Glucose, Bld 94  70 - 99 mg/dL   BUN 19  6 - 23 mg/dL   Creatinine, Ser 6.57  0.50 - 1.10 mg/dL   Calcium 7.3 (*) 8.4 - 10.5 mg/dL   Total Protein 6.6  6.0 - 8.3 g/dL   Albumin 1.1 (*) 3.5 - 5.2 g/dL   AST 76 (*) 0 - 37 U/L   ALT 28  0 - 35 U/L   Alkaline Phosphatase 154 (*) 39 - 117 U/L   Total Bilirubin 4.3 (*) 0.3 - 1.2 mg/dL   GFR calc non Af Amer 64 (*) >90 mL/min   GFR calc Af Amer 75 (*) >90 mL/min   Comment:            The eGFR has been calculated     using the CKD EPI equation.     This calculation has not been     validated in all clinical     situations.     eGFR's persistently     <90 mL/min signify     possible Chronic Kidney Disease.  CBC     Status: Abnormal   Collection Time    11/29/12  5:00 AM      Result Value Range   WBC 8.5  4.0 - 10.5 K/uL   RBC 2.72 (*) 3.87 - 5.11 MIL/uL   Hemoglobin 8.0 (*) 12.0 - 15.0 g/dL   HCT 84.6 (*) 96.2 - 95.2 %   MCV 94.5  78.0 - 100.0 fL   MCH 29.4  26.0 - 34.0 pg   MCHC 31.1  30.0 - 36.0 g/dL   RDW 84.1 (*) 32.4 - 40.1 %   Platelets 172  150 - 400 K/uL  GLUCOSE, CAPILLARY     Status: None   Collection Time    11/29/12  7:54 AM      Result Value Range    Glucose-Capillary 95  70 - 99  mg/dL   Comment 1 Documented in Chart     Comment 2 Notify RN    GLUCOSE, CAPILLARY     Status: Abnormal   Collection Time    11/29/12 12:27 PM      Result Value Range   Glucose-Capillary 114 (*) 70 - 99 mg/dL  GLUCOSE, CAPILLARY     Status: Abnormal   Collection Time    11/29/12  5:19 PM      Result Value Range   Glucose-Capillary 137 (*) 70 - 99 mg/dL   Comment 1 Documented in Chart     Comment 2 Notify RN    GLUCOSE, CAPILLARY     Status: None   Collection Time    11/29/12  9:30 PM      Result Value Range   Glucose-Capillary 94  70 - 99 mg/dL   Comment 1 Notify RN     Comment 2 Documented in Chart    CBC     Status: Abnormal   Collection Time    11/30/12  5:00 AM      Result Value Range   WBC 7.7  4.0 - 10.5 K/uL   RBC 2.57 (*) 3.87 - 5.11 MIL/uL   Hemoglobin 7.8 (*) 12.0 - 15.0 g/dL   HCT 81.1 (*) 91.4 - 78.2 %   MCV 94.2  78.0 - 100.0 fL   MCH 30.4  26.0 - 34.0 pg   MCHC 32.2  30.0 - 36.0 g/dL   RDW 95.6 (*) 21.3 - 08.6 %   Platelets 175  150 - 400 K/uL  BASIC METABOLIC PANEL     Status: Abnormal   Collection Time    11/30/12  5:00 AM      Result Value Range   Sodium 139  135 - 145 mEq/L   Potassium 3.4 (*) 3.5 - 5.1 mEq/L   Chloride 107  96 - 112 mEq/L   CO2 24  19 - 32 mEq/L   Glucose, Bld 103 (*) 70 - 99 mg/dL   BUN 20  6 - 23 mg/dL   Creatinine, Ser 5.78  0.50 - 1.10 mg/dL   Calcium 7.4 (*) 8.4 - 10.5 mg/dL   GFR calc non Af Amer 55 (*) >90 mL/min   GFR calc Af Amer 64 (*) >90 mL/min   Comment:            The eGFR has been calculated     using the CKD EPI equation.     This calculation has not been     validated in all clinical     situations.     eGFR's persistently     <90 mL/min signify     possible Chronic Kidney Disease.  GLUCOSE, CAPILLARY     Status: Abnormal   Collection Time    11/30/12  7:47 AM      Result Value Range   Glucose-Capillary 116 (*) 70 - 99 mg/dL    Ct Soft Tissue Neck W  Contrast  11/28/2012   *RADIOLOGY REPORT*  Clinical Data: Previous intubation.  Hoarseness.  Abnormal phonation.  CT NECK WITH CONTRAST  Technique:  Multidetector CT imaging of the neck was performed with intravenous contrast.  Contrast: OMNIPAQUE IOHEXOL 300 MG/ML  SOLN  Comparison: None.  Findings: Limited visualization of the intracranial contents does not show any acute finding.  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  Lung apices show an effusion on the left with dependent atelectasis.  Both parotid glands are normal.  Both submandibular glands are normal.  The thyroid gland is normal.  There are no enlarged lymph nodes on either side of the neck.  Arterial and venous structures are patent.  There is atherosclerotic disease of both carotid bifurcations.  No mucosal or submucosal mass lesion is seen.  The region of the glottis appears normal.  There is the suggestive appearance of an abnormal outpouching of the trachea at the level of the thoracic inlet extending anteriorly. However, I believe this is due to patient motion as there is also a duplication of some other anatomical findings such as the lung apices.  This would be a distinctly unusual tracheal abnormality.  IMPRESSION: I do not believe the CT scan shows an explanation for the patient's abnormal phonation.  There is the appearance of an outpouching in the trachea at the thoracic inlet, but I believe this is artifactual and related to patient motion during the scan.  See above discussion.   Original Report Authenticated By: Paulina Fusi, M.D.    Review of Systems  Respiratory: Positive for cough.   Gastrointestinal: Positive for abdominal pain.  Neurological: Positive for weakness.  All other systems reviewed and are negative.   Blood pressure 100/50, pulse 79, temperature 98.3 F (36.8 C), temperature source Oral, resp. rate 20, height 5\' 5"  (1.651 m), weight 102.3 kg (225 lb 8.5 oz), SpO2 96.00%. Physical Exam   Constitutional: She is oriented to person, place, and time. She appears well-developed and well-nourished. No distress.  HENT:  Head: Normocephalic and atraumatic.  Right Ear: External ear normal.  Left Ear: External ear normal.  Nose: Nose normal.  Mouth/Throat: Oropharynx is clear and moist.  Aphonic, breathy voice.  Cough with poor attack.  Eyes: Conjunctivae and EOM are normal. Pupils are equal, round, and reactive to light.  Neck: Normal range of motion. Neck supple.  Cardiovascular: Normal rate.   Respiratory: Effort normal.  GI:  Did not examine.  Genitourinary:  Did not examine.  Musculoskeletal: Normal range of motion.  Neurological: She is alert and oriented to person, place, and time. No cranial nerve deficit.  Skin: Skin is warm and dry.  Psychiatric: She has a normal mood and affect. Her behavior is normal. Judgment and thought content normal.    Assessment/Plan: Dysphonia, dysphagia The breathy nature of her hoarseness is consistent with a glottal closure problem.  I recommended bedside flexible laryngoscopy.  For details, see the procedure note.  Recommendations are included in the note.  Quinisha Mould 11/30/2012, 9:51 AM

## 2012-11-30 NOTE — Progress Notes (Signed)
11/30/12 1600  PT Visit Information  Last PT Received On 07/02/13  Assistance Needed +2  PT Time Calculation  PT Start Time 1548  PT Stop Time 1600  PT Time Calculation (min) 12 min  Subjective Data  Subjective pt ready to go bac to bed  Precautions  Precautions Fall  Precaution Comments abd surgery, abd binder when up  Cognition  Arousal/Alertness Awake/alert  Behavior During Therapy [facility] for tasks assessed/performed  Area of Impairment Safety/judgement;Attention  Safety/Judgement Decreased awareness of safety;Decreased awareness of deficits  General Comments pt attempts to stand without assist   Bed Mobility  Bed Mobility Sit to Supine  Sit to Supine: Patient Percentage 20%  Details for Bed Mobility Assistance multi-modal cues for technique, use of UEs to assist in lowering trunk; pt requires +2 for safety, LE and trunk assist   Transfers  Transfers Sit to Stand;Stand to Sit  Sit to Stand 1: +2 Total assist  Sit to Stand: Patient Percentage 70%  Stand to Sit 1: +2 Total assist  Stand to Sit: Patient Percentage 70%  Details for Transfer Assistance multi-modal cues for hand placement and wt shift  Ambulation/Gait  Ambulation/Gait Assistance 1: +2 Total assist  Ambulation Distance (Feet) 5 Feet  Assistive device Rolling walker  Ambulation/Gait Assistance Details cues for hand   Gait Pattern Decreased step length - right;Decreased step length - left;Step-to pattern;Shuffle;Narrow base of support  PT - End of Session  Equipment Utilized During Treatment Other (comment) (abd binder)  Activity Tolerance Patient tolerated treatment well  Patient left in bed;with call bell/phone within reach;with nursing in room  Nurse Communication Other (comment) (dsg leaking)  PT - Assessment/Plan  Comments on Treatment Session RN with pt to change dressing;   PT Plan Discharge plan remains appropriate;Frequency remains appropriate  PT Frequency Min 3X/week  Follow Up Recommendations  SNF;Supervision/Assistance - 24 hour  PT equipment None recommended by PT  Acute Rehab PT Goals  Time For Goal Achievement 12/07/12  Potential to Achieve Goals Good  Pt will go Sit to Supine/Side with mod assist  PT Goal: Sit to Supine/Side - Progress Progressing toward goal  Pt will go Sit to Stand with mod assist  PT Goal: Sit to Stand - Progress Progressing toward goal  Pt will Transfer Bed to Chair/Chair to Bed with mod assist  PT Transfer Goal: Bed to Chair/Chair to Bed - Progress Progressing toward goal  Pt will Ambulate 1 - 15 feet;with mod assist;with least restrictive assistive device  PT Goal: Ambulate - Progress Progressing toward goal

## 2012-11-30 NOTE — Progress Notes (Signed)
NUTRITION FOLLOW UP  Intervention:   - Encouraged increased PO intake and to continue to consume 100% of nutritional supplements - Unit RD to continue to monitor  Nutrition Dx:   Inadequate oral intake related to inability to eat as evidenced by NPO status, vent status, and perforated bowel - no longer appropriate.   New nutrition dx: Inadequate oral intake related to poor appetite as evidenced by <50% meal intake.   Goal:   Pt to meet >/= 90% of their estimated nutrition needs - not met   Monitor:   Weights, labs, intake   Assessment:   Pt had bedside swallow evaluation 5/22 and it was noted that pt had moderate aspiration risk with recommendations for dysphagia 2, nectar thick. Pt had some diarrhea last week which was resolved with Questran. Pt had transnasal fiberoptic laryngoscopy today for pt's dysphonia and dysphagia.   Met with pt who reports her appetite is much better and she reports she has been consuming 100% of nutritional supplements. Pt reports she ate 1/3 of her breakfast this morning and only a few bites of her lunch today.   Height: Ht Readings from Last 1 Encounters:  11/12/12 5\' 5"  (1.651 m)    Weight Status:   Wt Readings from Last 1 Encounters:  11/26/12 225 lb 8.5 oz (102.3 kg)    Re-estimated needs:  Kcal: 1450-1600 Protein: 85-100g Fluid: 1.4-1.6L/day  Skin: Abdominal incision, +1 generalized edema  Diet Order: Dysphagia 2, nectar thick    Intake/Output Summary (Last 24 hours) at 11/30/12 1213 Last data filed at 11/29/12 2200  Gross per 24 hour  Intake    480 ml  Output    475 ml  Net      5 ml    Last BM: 5/25   Labs:   Recent Labs Lab 11/24/12 0400 11/25/12 0340 11/26/12 0507 11/27/12 0420 11/29/12 0500 11/30/12 0500  NA 139 139 141 141 139 139  K 3.5 3.7 3.6 3.5 3.1* 3.4*  CL 103 105 107 106 107 107  CO2 32 30 29 28 25 24   BUN 24* 23 25* 26* 19 20  CREATININE 0.76 0.74 0.79 1.03 0.92 1.05  CALCIUM 7.7* 7.8* 7.8* 7.4*  7.3* 7.4*  MG 2.1 2.2 2.2  --   --   --   PHOS  --   --  3.8  --   --   --   GLUCOSE 162* 130* 112* 100* 94 103*    CBG (last 3)   Recent Labs  11/29/12 2130 11/30/12 0747 11/30/12 1201  GLUCAP 94 116* 176*    Scheduled Meds: . albuterol  2.5 mg Nebulization Q6H  . antiseptic oral rinse  15 mL Mouth Rinse Q4H  . cholestyramine light  4 g Oral BID  . enoxaparin (LOVENOX) injection  40 mg Subcutaneous Q24H  . feeding supplement  1 Container Oral Daily  . folic acid  1 mg Oral Daily  . furosemide  20 mg Intravenous Daily  . insulin aspart  0-20 Units Subcutaneous TID AC & HS  . labetalol  100 mg Oral TID  . levothyroxine  25 mcg Oral Q24H  . lip balm  1 application Topical BID  . multivitamin with minerals  1 tablet Oral Daily  . pantoprazole  40 mg Oral Daily  . QUEtiapine  25 mg Oral QHS  . sodium chloride  10-40 mL Intracatheter Q12H  . thiamine  100 mg Oral Daily      Levon Hedger MS, RD,  LDN [phone] Pager [phone] After Hours Pager

## 2012-11-30 NOTE — Progress Notes (Signed)
Occupational Therapy Treatment Patient Details Name: [name] [name] MRN: [id number] DOB: 1945/09/06 Today's Date: 11/30/2012 Time: 4098-1191 OT Time Calculation (min): 20 min  OT Assessment / Plan / Recommendation Comments on Treatment Session Pt was up in chair.  Needs cues for activities but very willing to participate in therapy.  Self-feeding has improved:  good bite size and pace.  Pt is full supervision for feeding    Follow Up Recommendations  SNF    Barriers to Discharge       Equipment Recommendations  3 in 1 bedside comode    Recommendations for Other Services    Frequency Min 2X/week   Plan Frequency remains appropriate    Precautions / Restrictions Precautions Precautions: Fall  Precaution Comments: abd surgery, abd binder when up  Restrictions Weight Bearing Restrictions: No   Pertinent Vitals/Pain No c/o pain    ADL  Eating/Feeding: Set up Where Assessed - Eating/Feeding: Chair Grooming: Teeth care;Brushing hair;Moderate assistance (teeth A to reach/exchange supplies; max cues to spit) Where Assessed - Grooming: Supported sitting ADL Comments: pt was up in recliner.  After self-feeding several bites of pudding and thickened liquids, performed grooming tasks.  Also performed 2 sets 10 A/AAROM.  Borke into sets of 5 and first 10 were active ROM and second 10 were AA due to muscle fatique    OT Diagnosis:    OT Problem List:   OT Treatment Interventions:     OT Goals Acute Rehab OT Goals Time For Goal Achievement: 12/09/12 ADL Goals Pt Will Perform Eating: with min assist;Sitting, chair;Supported ADL Goal: Eating - Progress: Met Pt Will Perform Grooming: with min assist;Sitting, chair;Supported ADL Goal: Grooming - Progress: Progressing toward goals Miscellaneous OT Goals Miscellaneous OT Goal #2: Pt will perform 2 sets of 10 shoulder AROM exercises x 2 motions to increase strength for adls. OT Goal: Miscellaneous Goal #2 - Progress: Progressing  toward goals  Visit Information  Last OT Received On: 11/30/12 Assistance Needed: +2     Subjective Data      Prior Functioning       Cognition  Cognition Arousal/Alertness: Awake/alert  Behavior During Therapy: WFL for tasks assessed/performed   Overall Cognitive Status: Impaired/Different from baseline ) Area of Impairment: Safety/judgement;Attention Current Attention Level: Sustained General Comments: max cues to spit during teethbrushing    Mobility  Bed Mobility Bed Mobility: Right Sidelying to Sit Supine to Sit: 3: Mod assist    Exercises      Balance     End of Session OT - End of Session Activity Tolerance: Patient tolerated treatment well Patient left: in chair;with call bell/phone within reach  GO     [name] [name] 11/30/2012, 1:39 PM Marica Otter, OTR/L [phone] 11/30/2012

## 2012-11-30 NOTE — Progress Notes (Addendum)
Patient ID: [name] [name], female   DOB: Mar 22, 1946, 67 y.o.   MRN: [id number]  [facility] PROGRESS NOTE  [name] [name]:[id number] DOB: 01-Oct-1945 DOA: 11/11/2012 PCP: Judie Petit, MD  Brief narrative: 67 yo female with alcoholic liver disease was admitted on 5/7 with abdominal pain and was found on 5/8 to have a small bowel perforation. She was taken to the OR and returned to the ICU with an open wound and mechanically ventilated.   Principal problems: Respiratory failure-postoperative  - Status post extubation on 5/18, clinically stable and maintaining oxygen saturations at target range - Continue nebulizer treatments every 6 hours  - ENT consulted for further management of dysphonia and dysphagia - status post transnasal fiberoptic laryngoscopy 11/30/2012 (Dr. Jenne Pane) - findings consistent with immobile right vocal fold and large glottal gap with phonation, high risk aspiration and will need recommendation from speech pathology in terms of proper diet - ENT recommended outpatient follow up - dysphagia II diet recommended and will continue so  Sepsis/septic shock secondary to peritonitis  - Off vasopressors and BP stable, BP still overall soft, I will decrease the dose of Labetalol as pt is also on Lasix  - last ECHO in 2007 showed EF of 55-65% with some mitral valve calcification  Acute kidney failure - likely pre renal in etiology - now resolved, continue diuresis  Anemia of chronic disease - drop in Hg, if Hg < 7.5 in AM will plan on transfusion of PRBC Severe protein calorie malnutrition  - pt is at high risk for aspiration, continue dysphagia II diet  - nutrition consult pending  Alcoholic cirrhosis and Acute alcoholic hepatitis.  - pt denies drinking, cessation discussed  Hypokalemia - will supplement via IV today, BMP in AM Transaminitis  - alcoholic hepatitis, improving  Perforated viscus in jejunem s/p repair 5/8.  - s/p washout abdominal wound,  debridement of Fascia, Partial Closure  - appreciate surgery input  Hypothyroidism  - continue Synthroid  Acute encephalopathy  - resolved - continue thiamine, folate secondary to alcohol abuse history  SIGNIFICANT EVENTS / STUDIES:  5/7 Admitted with abdominal pain, nausea, vomiting  5/7 OR >>> Perforated viscus, peritonitis, lysis of adhesions, small bowel repair  5/16 Reintubated r/t complete left sided collapse, increased WBC  5/18 Extubated, WBC decreased, TF started  5/20 Speech assessment >> D1 diet  5/22 D/c TPN, transfer to non tele floor bed  5/26 - ENT consult --> status post transnasal fiberoptic laryngoscopy (Dr. Jenne Pane)- findings consistent with immobile right vocal fold and large glottal gap with phonation, high risk aspiration and will need recommendation from speech pathology in terms of proper diet  LINES / TUBES:  OETT 5/8 >>>5/15>>>Reintubated 5/16>>> 5/18  NGT 5/8 >>> 5/20  Foley 5/8 >>>  R rad A-line 5/8 >>>5/12>>>5/16>>>5/18  L IJ CVL 5/8 >>>5/19  R Brach PICC>>>5/19  CULTURES:  5/8 Blood >>>negative  5/7 Urine >>>negative  Blood 5/15>>>neg  Urine 5/15>>>neg  Sputum 5/15>>>neg  Sputum via bronch 5/16>>> Neg  ANTIBIOTICS:  Aztreonam 5/8 >>>5/9  Flagyl 5/8 >>>5/9  Levaquin 5/8 x 1  Cipro 5/8 x 1  ertapenem 5/9>>>5/16  Vanc 5/15 >>>5/19  Primaxin 5/16 >>>5/21   Consultants:  Surgery   Code Status: Full Family Communication: Pt at bedside Disposition Plan: SNF likely in AM  HPI/Subjective: No events overnight.   Objective: Filed Vitals:   11/29/12 1636 11/29/12 2040 11/29/12 2051 11/30/12 0810  BP: 98/57  100/50   Pulse:   79   Temp:  98.3 F (36.8 C)   TempSrc:   Oral   Resp:   20   Height:      Weight:      SpO2:  97% 100% 96%    Intake/Output Summary (Last 24 hours) at 11/30/12 1258 Last data filed at 11/29/12 2200  Gross per 24 hour  Intake    480 ml  Output    475 ml  Net      5 ml    Exam:   General:  Pt is alert,  follows commands appropriately, not in acute distress, dysphonia  Cardiovascular: Regular rate and rhythm, S1/S2, no murmurs, no rubs, no gallops  Respiratory: Clear to auscultation bilaterally, decreased breath sounds at bases   Abdomen: Soft, tender in epigastric area, slightly distended, bowel sounds present, no guarding  Extremities: Trace bilateral pitting edema, pulses DP and PT palpable bilaterally  Neuro: Grossly nonfocal  Data Reviewed: Basic Metabolic Panel:  Recent Labs Lab 11/24/12 0400 11/25/12 0340 11/26/12 0507 11/27/12 0420 11/29/12 0500 11/30/12 0500  NA 139 139 141 141 139 139  K 3.5 3.7 3.6 3.5 3.1* 3.4*  CL 103 105 107 106 107 107  CO2 32 30 29 28 25 24   GLUCOSE 162* 130* 112* 100* 94 103*  BUN 24* 23 25* 26* 19 20  CREATININE 0.76 0.74 0.79 1.03 0.92 1.05  CALCIUM 7.7* 7.8* 7.8* 7.4* 7.3* 7.4*  MG 2.1 2.2 2.2  --   --   --   PHOS  --   --  3.8  --   --   --    Liver Function Tests:  Recent Labs Lab 11/26/12 0507 11/29/12 0500  AST 75* 76*  ALT 29 28  ALKPHOS 160* 154*  BILITOT 3.2* 4.3*  PROT 6.3 6.6  ALBUMIN 1.2* 1.1*   CBC:  Recent Labs Lab 11/25/12 0340 11/27/12 0420 11/28/12 0425 11/29/12 0500 11/30/12 0500  WBC 14.0* 8.2 8.5 8.5 7.7  HGB 8.5* 8.1* 8.0* 8.0* 7.8*  HCT 27.1* 25.1* 26.0* 25.7* 24.2*  MCV 94.8 94.7 94.5 94.5 94.2  PLT 153 177 171 172 175   CBG:  Recent Labs Lab 11/29/12 1227 11/29/12 1719 11/29/12 2130 11/30/12 0747 11/30/12 1201  GLUCAP 114* 137* 94 116* 176*    Recent Results (from the past 240 hour(s))  CLOSTRIDIUM DIFFICILE BY PCR     Status: None   Collection Time    11/27/12  3:20 AM      Result Value Range Status   C difficile by pcr NEGATIVE  NEGATIVE Final     Scheduled Meds: . albuterol  2.5 mg Nebulization Q6H  . antiseptic oral rinse  15 mL Mouth Rinse Q4H  . cholestyramine light  4 g Oral BID  . enoxaparin (LOVENOX) injection  40 mg Subcutaneous Q24H  . feeding supplement  1  Container Oral Daily  . folic acid  1 mg Oral Daily  . furosemide  20 mg Intravenous Daily  . insulin aspart  0-20 Units Subcutaneous TID AC & HS  . labetalol  100 mg Oral TID  . levothyroxine  25 mcg Oral Q24H  . lip balm  1 application Topical BID  . multivitamin with minerals  1 tablet Oral Daily  . pantoprazole  40 mg Oral Daily  . QUEtiapine  25 mg Oral QHS  . sodium chloride  10-40 mL Intracatheter Q12H  . thiamine  100 mg Oral Daily   Continuous Infusions:    Debbora Presto, MD  [facility] Pager  [phone]  If 7PM-7AM, please contact night-coverage [email or web address] Password [facility] 11/30/2012, 12:58 PM   LOS: 19 days

## 2012-11-30 NOTE — Progress Notes (Signed)
Speech Language Pathology Dysphagia Treatment Patient Details Name: [name] [name] MRN: [id number] DOB: 09-Jan-1946 Today's Date: 11/30/2012 Time: 1308-6578 SLP Time Calculation (min): 15 min  Assessment / Plan / Recommendation Clinical Impression  Diagnostic treatment focused on diet tolerance and potential to advance complete. 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  Patient able to self feed clinician provided diagnostic po trials with intermittent subtle wet vocal quality. No immediate coughing noted however intermittent coughing associated with phonation observed throughout treatment.  Voice remains hoarse with greater periods of aphonia than actual phonation.  Agree with previous SLP in that suspect as phonatory ability improves, we will see concomitant improvement in swallow function however at this time, given suspected CN involvement and continued indication of dysphagia, recommend objectively evaluating swallowing function to determine most appropriate diet and potential to advance. Will plan for [facility] 5/27.   Noted thin liquids (water and boost juice) on bedside table, unthickened in which patient reported to be consuming stating "I dont care for that thickener". Educated patient and RN on need to remain compliant with current diet recommendations including thickened liquids in order to maintain highest degree of safety until objective evaluation could be completed.     Diet Recommendation  Continue with Current Diet: Dysphagia 2 (fine chop);Nectar-thick liquid    SLP Plan MBS   Pertinent Vitals/Pain none   Swallowing Goals  SLP Swallowing Goals Patient will utilize recommended strategies during swallow to increase swallowing safety with: Maximum assistance Swallow Study Goal #2 - Progress: Progressing toward goal Goal #3: Pt will demonstration ability to phonate 4 words per  breath group with adequate control to demonstrate improved airway protection.   Swallow Study Goal #3 - Progress: Progressing toward goal  General Temperature Spikes Noted: No Respiratory Status: Supplemental O2 delivered via (comment) Behavior/Cognition: Alert;Cooperative;Pleasant mood Oral Cavity - Dentition: Adequate natural dentition Patient Positioning: Upright in chair   Dysphagia Treatment Treatment focused on: Skilled observation of diet tolerance;Upgraded PO texture trials;Utilization of compensatory strategies;Patient/family/caregiver education Treatment Methods/Modalities: Skilled observation;Differential diagnosis Patient observed directly with PO's: Yes Type of PO's observed: Thin liquids;Dysphagia 1 (puree);Nectar-thick liquids Feeding: Able to feed self Liquids provided via: Cup;Straw Pharyngeal Phase Signs & Symptoms: Wet vocal quality;Delayed cough Type of cueing: Verbal Amount of cueing: Minimal   GO   Ferdinand Lango MA, CCC-SLP [phone]   Mekala Winger Meryl 11/30/2012, 1:24 PM

## 2012-11-30 NOTE — Progress Notes (Signed)
Clinical Social Work  CSW attempted to meet with patient but patient currently completing PT. CSW will follow up at later time to discuss DC plans.  Unk Lightning, LCSW (Coverage for [employer])

## 2012-11-30 NOTE — Progress Notes (Signed)
Patient blood pressure 96/54 HR-76 and 100mg  of labetalol due, notified K. Kirby-NP and she ordered to hold the PM dose. Will continue to assess patient.

## 2012-11-30 NOTE — Progress Notes (Signed)
Physical Therapy Treatment Patient Details Name: [name] [name] MRN: [id number] DOB: 03/06/1946 Today's Date: 11/30/2012 Time: 1215-1258 PT Time Calculation (min): 43 min  PT Assessment / Plan / Recommendation Comments on Treatment Session  pt progressing, increased gait distance;  continues to require +2 for balance, safety, chair; Will need SNF    Follow Up Recommendations  SNF;Supervision/Assistance - 24 hour     Does the patient have the potential to tolerate intense rehabilitation     Barriers to Discharge        Equipment Recommendations  None recommended by PT    Recommendations for Other Services    Frequency Min 3X/week   Plan Discharge plan remains appropriate;Frequency remains appropriate    Precautions / Restrictions Precautions Precautions: Fall (Simultaneous filing. User may not have seen previous data.) Precaution Comments: abd surgery, abd binder when up (Simultaneous filing. User may not have seen previous data.) Restrictions Weight Bearing Restrictions: No   Pertinent Vitals/Pain sats >97% on RA O2 replaced     Mobility  Bed Mobility Bed Mobility: Right Sidelying to Sit Supine to Sit: 3: Mod assist Transfers Transfers: Sit to Stand;Stand to Sit Sit to Stand: 1: +2 Total assist Sit to Stand: Patient Percentage: 60% Stand to Sit: 1: +2 Total assist Stand to Sit: Patient Percentage: 60% Details for Transfer Assistance: multi-modal cues for hand placement and wt shift Ambulation/Gait Ambulation/Gait Assistance: 1: +2 Total assist Ambulation/Gait: Patient Percentage: 60% Ambulation Distance (Feet): 26 Feet Assistive device: Rolling walker Ambulation/Gait Assistance Details: cues for incr step length, RW position, use of UEs, facilitation of lateral wt shift to advance LEs; +2 for chair and safety Gait Pattern: Decreased step length - right;Decreased step length - left;Step-to pattern;Shuffle;Narrow base of support    Exercises Total Joint  Exercises Ankle Circles/Pumps: AROM;Both;10 reps Quad Sets: AROM;Both;10 reps Short Arc QuadBarbaraann [name];Both;AROM;10 reps Heel Slides: AROM;AAROM;Both;10 reps   PT Diagnosis:    PT Problem List:   PT Treatment Interventions:     PT Goals Acute Rehab PT Goals Time For Goal Achievement: 12/07/12 Potential to Achieve Goals: Good Pt will go Supine/Side to Sit: with mod assist PT Goal: Supine/Side to Sit - Progress: Progressing toward goal Pt will go Sit to Stand: with mod assist PT Goal: Sit to Stand - Progress: Progressing toward goal Pt will Transfer Bed to Chair/Chair to Bed: with mod assist PT Transfer Goal: Bed to Chair/Chair to Bed - Progress: Progressing toward goal Pt will Ambulate: 1 - 15 feet;with mod assist;with least restrictive assistive device PT Goal: Ambulate - Progress: Progressing toward goal  Visit Information  Last PT Received On: 11/30/12 Assistance Needed: +2 (Simultaneous filing. User may not have seen previous data.)    Subjective Data  Subjective: pt agrees, would like to move she says Patient Stated Goal: talk   Cognition  Cognition Arousal/Alertness: Awake/alert (Simultaneous filing. User may not have seen previous data.) Behavior During Therapy: [facility] for tasks assessed/performed (Simultaneous filing. User may not have seen previous data.) Overall Cognitive Status: Impaired/Different from baseline (Simultaneous filing. User may not have seen previous data.) Area of Impairment: Safety/judgement;Attention (Simultaneous filing. User may not have seen previous data.) Current Attention Level: Sustained General Comments: max cues to spit during teethbrushing    Balance  Static Sitting Balance Static Sitting - Balance Support: No upper extremity supported Static Sitting - Level of Assistance: 5: Stand by assistance Static Sitting - Comment/# of Minutes: sat EOB x 8 min with supervision; work on trunk extension and anterior chest chest  End of Session PT - End  of Session Equipment Utilized During Treatment: Gait belt;Other (comment) (abd binder) Activity Tolerance: Patient tolerated treatment well Patient left: in chair;with call bell/phone within reach Nurse Communication: Mobility status   GP     [facility] 11/30/2012, 2:04 PM

## 2012-11-30 NOTE — Procedures (Signed)
Preop diagnosis: Dysphonia, dysphagia Postop diagnosis: same, right vocal fold immobility Procedure: Transnasal fiberoptic laryngoscopy Surgeon: Jenne Pane Anesth: None Compl: None Findings: Right vocal fold is immobile and lateralized.  There is a large glottal gap with phonation. Description: After discussing risks, benefits, and alternatives, the patient was positioned in a seated position.  The flexible laryngoscope was passed through the left nasal passage and used to view the pharynx and larynx.  After completion, the scope was removed.  She tolerated the procedure well and was returned to nursing care. Recommendations: The right vocal fold is immobile, either from injury to the right recurrent laryngeal nerve (more likely) or dislocation of the cricoarytenoid joint (less likely).  Either of these injuries can occur with intubation.  In order to evaluate further, laryngeal EMG will be considered as an outpatient.  She should follow-up with me in my office after she recovers further.  Further therapy will be considered depending on EMG results and whether she improves with time.  Her laryngeal closure problem puts her at high risk of aspiration.  Restricted diet per speech pathologist recommendations is important.

## 2012-11-30 NOTE — Progress Notes (Signed)
16 Days Post-Op   Assessment: s/p Procedure(s): Washout Abdominal Wound, debridement of Fascia, Partial Closure Patient Active Problem List   Diagnosis Date Noted  . Delirium 11/24/2012  . Acute respiratory failure 11/13/2012  . Septic shock(785.52) 11/13/2012  . Aspiration pneumonia 11/12/2012  . Dyspnea 11/12/2012  . Cough 11/12/2012  . Wheezing 11/12/2012  . Hypoglycemia 11/12/2012  . Transaminitis 11/12/2012  . Alcoholic hepatitis 11/12/2012  . Normocytic anemia 11/12/2012  . Ileus 11/12/2012  . Perforation of jejunum s/p primary repair 11/12/2012 11/12/2012  . Nausea & vomiting 11/11/2012  . Abdominal pain, generalized 11/11/2012  . Obesity 11/11/2012  . ARF (acute renal failure) 11/11/2012  . 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 Coagulopathy 11/11/2012  . Hyperlipidemia 11/11/2012  . Anxiety 11/11/2012  . Alcohol withdrawal 11/11/2012  . Increased bilirubin level 11/11/2012  . Gastrointestinal hemorrhage 09/25/2012  . Anemia 09/25/2012  . Alcohol abuse 09/18/2012  . Hyponatremia 09/18/2012  . Thrombocytopenia 09/18/2012  . Elevated INR 09/18/2012  . Anemia due to blood loss, acute 01/24/2012  . HYPERGLYCEMIA 10/14/2008  . DIVERTICULOSIS OF COLON 07/20/2007  . HYPERTENSION 01/30/2007  . 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 CEREBROVASCULAR ACCIDENT, HX OF 01/30/2007    Continues to gain strength: diet tolerated and hopefully advance today, pending slp eval  Plan: Advance diet pert slp recs pending  Subjective: Feels stronger each day. Thinks voice is impoving slowly. Diarrhea has stopped with addition of questran  Objective: Vital signs in last 24 hours: Temp:  [97.5 F (36.4 C)-98.3 F (36.8 C)] 98.3 F (36.8 C) (05/25 2051) Pulse Rate:  [79-83] 79 (05/25 2051) Resp:  [20] 20 (05/25 2051) BP: (98-100)/(50-57) 100/50 mmHg (05/25 2051) SpO2:  [95 %-100 %] 96 % (05/26 0810) FiO2 (%):  [28 %] 28 % (05/26  0810)   Intake/Output from previous day: 05/25 0701 - 05/26 0700 In: 720 [P.O.:720] Out: 475 [Urine:475]  General appearance: alert, cooperative and no distress GI: Still distended, but soft and not tender  Incision: Fairly clean but some necrotic fascia at the mid portion of wound, but all intact  Lab Results:   Recent Labs  11/29/12 0500 11/30/12 0500  WBC 8.5 7.7  HGB 8.0* 7.8*  HCT 25.7* 24.2*  PLT 172 175   BMET  Recent Labs  11/29/12 0500 11/30/12 0500  NA 139 139  K 3.1* 3.4*  CL 107 107  CO2 25 24  GLUCOSE 94 103*  BUN 19 20  CREATININE 0.92 1.05  CALCIUM 7.3* 7.4*    MEDS, Scheduled . albuterol  2.5 mg Nebulization Q6H  . antiseptic oral rinse  15 mL Mouth Rinse Q4H  . cholestyramine light  4 g Oral BID  . enoxaparin (LOVENOX) injection  40 mg Subcutaneous Q24H  . feeding supplement  1 Container Oral Daily  . folic acid  1 mg Oral Daily  . furosemide  20 mg Intravenous Daily  . insulin aspart  0-20 Units Subcutaneous TID AC & HS  . labetalol  100 mg Oral TID  . levothyroxine  25 mcg Oral Q24H  . lip balm  1 application Topical BID  . multivitamin with minerals  1 tablet Oral Daily  . pantoprazole  40 mg Oral Daily  . QUEtiapine  25 mg Oral QHS  . sodium chloride  10-40 mL Intracatheter Q12H  . thiamine  100 mg Oral Daily    Studies/Results: Ct Soft Tissue Neck W Contrast  11/28/2012   *RADIOLOGY REPORT*  Clinical Data: Previous  intubation.  Hoarseness.  Abnormal phonation.  CT NECK WITH CONTRAST  Technique:  Multidetector CT imaging of the neck was performed with intravenous contrast.  Contrast: OMNIPAQUE IOHEXOL 300 MG/ML  SOLN  Comparison: None.  Findings: Limited visualization of the intracranial contents does not show any acute finding.  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  Lung apices show an effusion on the left with dependent atelectasis.  Both parotid glands are normal.  Both submandibular glands are normal.  The  thyroid gland is normal.  There are no enlarged lymph nodes on either side of the neck.  Arterial and venous structures are patent.  There is atherosclerotic disease of both carotid bifurcations.  No mucosal or submucosal mass lesion is seen.  The region of the glottis appears normal.  There is the suggestive appearance of an abnormal outpouching of the trachea at the level of the thoracic inlet extending anteriorly. However, I believe this is due to patient motion as there is also a duplication of some other anatomical findings such as the lung apices.  This would be a distinctly unusual tracheal abnormality.  IMPRESSION: I do not believe the CT scan shows an explanation for the patient's abnormal phonation.  There is the appearance of an outpouching in the trachea at the thoracic inlet, but I believe this is artifactual and related to patient motion during the scan.  See above discussion.   Original Report Authenticated By: Paulina Fusi, M.D.      LOS: 19 days     Currie Paris, MD, [facility], [place] [phone]   11/30/2012 8:28 AM

## 2012-12-01 ENCOUNTER — Inpatient Hospital Stay (HOSPITAL_COMMUNITY): Payer: BC Managed Care – PPO

## 2012-12-01 LAB — BASIC METABOLIC PANEL
Chloride: 106 mEq/L (ref 96–112)
GFR calc Af Amer: 64 mL/min — ABNORMAL LOW (ref >90)
Potassium: 3.7 mEq/L (ref 3.5–5.1)

## 2012-12-01 LAB — CBC
Platelets: 178 10*3/uL (ref 150–400)
RDW: 21.1 % — ABNORMAL HIGH (ref 11.5–15.5)
WBC: 7.9 10*3/uL (ref 4.0–10.5)

## 2012-12-01 LAB — GLUCOSE, CAPILLARY: Glucose-Capillary: 74 mg/dL (ref 70–99)

## 2012-12-01 NOTE — Progress Notes (Signed)
Clinical Social Work  [facility] called and reported they had spoken with family and requested additional information. CSW faxed clinicals via [facility]. CSW will continue to follow to determine if they can offer a bed.  Unk Lightning, LCSW (Coverage for [employer])

## 2012-12-01 NOTE — Progress Notes (Signed)
Calorie count envelope has been hung on the patient's door. Document percent consumed for each item on the patient's meal tray ticket and keep in envelope. Also document percent of any supplement or snack pt consumes and keep documentation in envelope for RD to review. Pt made aware that calorie count is in progress. Hodari Chuba Barnett RD, LDN Inpatient Clinical Dietitian Pager: [phone] After Hours Pager: [phone]   

## 2012-12-01 NOTE — Progress Notes (Signed)
General Surgery [facility]  Patient seen and examined.  Ongoing wound care - wound examined today by Zola Button for our service.  Will follow.  Disposition per medical team.  Velora Heckler, MD, [facility] Office: [phone]

## 2012-12-01 NOTE — Progress Notes (Signed)
Patient ID: [name] [name], female   DOB: 12/16/1945, 67 y.o.   MRN: [id number]  [facility] PROGRESS NOTE  [name] [name]:[id number] DOB: 04-09-1946 DOA: 11/11/2012 PCP: Judie Petit, MD  Brief narrative:  67 yo female with alcoholic liver disease was admitted on 5/7 with abdominal pain and was found on 5/8 to have a small bowel perforation. She was taken to the OR and returned to the ICU with an open wound and mechanically ventilated. Pt likely ready for discharge in AM 5/28 if bed at [facility] available.   Principal problems:  Respiratory failure-postoperative  - Status post extubation on 5/18, clinically stable and maintaining oxygen saturations at target range  - Continue nebulizer treatments every 6 hours  - ENT consulted for further management of dysphonia and dysphagia - status post transnasal fiberoptic laryngoscopy 11/30/2012 (Dr. 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, high risk aspiration and will need recommendation from speech pathology in terms of proper diet  - ENT recommended outpatient follow up  - dysphagia III diet recommended  Sepsis/septic shock secondary to peritonitis  - Off vasopressors, BP initially improved but over the past 24 hours relatively soft, will hold Lasix and Labetalol for now - if BP improved one medication at the time may be added - last ECHO in 2007 showed EF of 55-65% with some mitral valve calcification  Acute kidney failure  - likely pre renal in etiology  - now resolved, will have to hold Lasix as noted above due to soft BP Anemia of chronic disease  - drop in Hg but stable over 24 hours, if Hg < 7.5 in AM will plan on transfusion of PRBC  Severe protein calorie malnutrition  - pt is at high risk for aspiration, continue dysphagia III diet as recommended after SLP and MBS evaluation  Alcoholic cirrhosis and Acute alcoholic hepatitis.  - pt denies drinking,  cessation discussed  Hypokalemia  - supplemented, will repeat BMP in AM Transaminitis  - alcoholic hepatitis, improving  Perforated viscus in jejunem s/p repair 5/8.  - s/p washout abdominal wound, debridement of fascia, partial closure  - appreciate surgery input  Hypothyroidism  - continue Synthroid  Acute encephalopathy  - resolved but still somewhat confused at times - continue thiamine, folate secondary to alcohol abuse history   SIGNIFICANT EVENTS / STUDIES:  5/7 Admitted with abdominal pain, nausea, vomiting  5/7 OR >>> Perforated viscus, peritonitis, lysis of adhesions, small bowel repair  5/16 Reintubated r/t complete left sided collapse, increased WBC  5/18 Extubated, WBC decreased, TF started  5/20 Speech assessment >> D1 diet  5/22 D/c TPN, transfer to non tele floor bed  5/26 - ENT consult --> status post transnasal fiberoptic laryngoscopy (Dr. Jenne Pane)- findings consistent with immobile right vocal fold and large glottal gap with phonation, high risk aspiration and will need recommendation from speech pathology in terms of proper diet  LINES / TUBES:  OETT 5/8 >>>5/15>>>Reintubated 5/16>>> 5/18  NGT 5/8 >>> 5/20  Foley 5/8 >>>  R rad A-line 5/8 >>>5/12>>>5/16>>>5/18  L IJ CVL 5/8 >>>5/19  R Brach PICC>>>5/19  CULTURES:  5/8 Blood >>>negative  5/7 Urine >>>negative  Blood 5/15>>>neg  Urine 5/15>>>neg  Sputum 5/15>>>neg  Sputum via bronch 5/16>>> Neg  ANTIBIOTICS:  Aztreonam 5/8 >>>5/9  Flagyl 5/8 >>>5/9  Levaquin 5/8 x 1  Cipro 5/8 x 1  ertapenem 5/9>>>5/16  Vanc 5/15 >>>5/19  Primaxin 5/16 >>>5/21  Consultants:  Surgery  Code Status: Full  Family Communication: Pt at bedside, husband over the phone Disposition Plan: SNF likely in AM if bed available  HPI/Subjective: No events overnight.   Objective: Filed Vitals:   12/01/12 0420 12/01/12 0839 12/01/12 1202 12/01/12 1450  BP: 89/59  98/58 91/49  Pulse: 77   82  Temp: 98.2 F (36.8 C)   98.1 F  (36.7 C)  TempSrc: Oral   Oral  Resp: 16   19  Height:      Weight:      SpO2: 100% 97%  100%    Intake/Output Summary (Last 24 hours) at 12/01/12 1930 Last data filed at 12/01/12 1810  Gross per 24 hour  Intake    240 ml  Output    475 ml  Net   -235 ml    Exam:   General:  Pt is alert, follows commands appropriately, not in acute distress, very frail appearing, still dysphonic   Cardiovascular: Regular rate and rhythm, S1/S2, no rubs, no gallops  Respiratory: Clear to auscultation bilaterally, diminished breath sounds at bases  Abdomen: Soft, non tender, non distended, bowel sounds present, no guarding  Extremities: Trace bilateral pitting edema, pulses DP and PT palpable bilaterally  Neuro: Grossly nonfocal  Data Reviewed: Basic Metabolic Panel:  Recent Labs Lab 11/25/12 0340 11/26/12 0507 11/27/12 0420 11/29/12 0500 11/30/12 0500 12/01/12 0500  NA 139 141 141 139 139 135  K 3.7 3.6 3.5 3.1* 3.4* 3.7  CL 105 107 106 107 107 106  CO2 30 29 28 25 24 22   GLUCOSE 130* 112* 100* 94 103* 76  BUN 23 25* 26* 19 20 18   CREATININE 0.74 0.79 1.03 0.92 1.05 1.05  CALCIUM 7.8* 7.8* 7.4* 7.3* 7.4* 7.4*  MG 2.2 2.2  --   --   --   --   PHOS  --  3.8  --   --   --   --    Liver Function Tests:  Recent Labs Lab 11/26/12 0507 11/29/12 0500  AST 75* 76*  ALT 29 28  ALKPHOS 160* 154*  BILITOT 3.2* 4.3*  PROT 6.3 6.6  ALBUMIN 1.2* 1.1*   CBC:  Recent Labs Lab 11/27/12 0420 11/28/12 0425 11/29/12 0500 11/30/12 0500 12/01/12 0500  WBC 8.2 8.5 8.5 7.7 7.9  HGB 8.1* 8.0* 8.0* 7.8* 7.9*  HCT 25.1* 26.0* 25.7* 24.2* 25.0*  MCV 94.7 94.5 94.5 94.2 94.7  PLT 177 171 172 175 178   CBG:  Recent Labs Lab 11/30/12 1701 11/30/12 2057 12/01/12 0727 12/01/12 1200 12/01/12 1644  GLUCAP 123* 85 74 110* 138*    Recent Results (from the past 240 hour(s))  CLOSTRIDIUM DIFFICILE BY PCR     Status: None   Collection Time    11/27/12  3:20 AM      Result Value  Range Status   C difficile by pcr NEGATIVE  NEGATIVE Final     Scheduled Meds: . albuterol  2.5 mg Nebulization Q6H  . cholestyramine light  4 g Oral BID  . enoxaparin  injection  40 mg Subcutaneous Q24H  . folic acid  1 mg Oral Daily  . furosemide  20 mg Oral Daily  . insulin aspart  0-20 Units Subcutaneous TID AC & HS  . labetalol  100 mg Oral BID  . levothyroxine  25 mcg Oral Q24H  . pantoprazole  40 mg Oral Daily  . QUEtiapine  25 mg Oral QHS  . thiamine  100 mg Oral Daily  Continuous Infusions:   Debbora Presto, MD  [facility] Pager [phone]  If 7PM-7AM, please contact night-coverage [email or web address] Password [email or web address] 12/01/2012, 7:30 PM   LOS: 20 days

## 2012-12-01 NOTE — Progress Notes (Signed)
Clinical Social Work  CSW received call from RN reporting that patient wanted to talk with CSW regarding SNF placement. CSW met with patient at bedside who struggled with talking but was able to write down her questions. Patient reports she prefers to go to [facility]. CSW explained that [facility] was unable to offer a bed and provided bed offers again. Patient reports that she wants to talk with husband about options. CSW explained if patient chose a facility that was not in-network with her insurance then she would have to pay out of network costs or privately pay. Patient desires to discuss options with family before making any final decisions. CSW encouraged patient to make a decision and explained that DC could not be delayed if the only barrier was waiting for insurance authorization. Patient voiced understanding. CSW will continue to follow.  Unk Lightning, LCSW (Coverage for [employer])

## 2012-12-01 NOTE — Progress Notes (Signed)
17 Days Post-Op  Subjective: Not really with it mentally, doesn't seem to remember to much about what happened, or why. Very appreciative of everything. Currently BP 98/58 when I did cuff pressure.  Objective: Vital signs in last 24 hours: Temp:  [97.5 F (36.4 C)-98.3 F (36.8 C)] 98.2 F (36.8 C) (05/27 0420) Pulse Rate:  [72-82] 77 (05/27 0420) Resp:  [16-19] 16 (05/27 0420) BP: (82-96)/(47-60) 89/59 mmHg (05/27 0420) SpO2:  [95 %-100 %] 97 % (05/27 0839) FiO2 (%):  [28 %] 28 % (05/26 1426) Last BM Date: 11/29/12 120 Po recorded +BM Diet:  Dysphagia II SBP 82-100, last BP 89, with HR of 77, on 3l/[place] Sats are good Labs stable,  Intake/Output from previous day: 05/26 0701 - 05/27 0700 In: 680 [P.O.:120; IV Piggyback:400] Out: 576 [Urine:575; Stool:1] Intake/Output this shift:    General appearance: alert, cooperative and chronically ill, winded at rest. Resp: bilat rales and ronchi GI: soft, few BS, Open wound with pink sides, midline there is an area that is necrotic, not healing yet  Lab Results:   Recent Labs  11/30/12 0500 12/01/12 0500  WBC 7.7 7.9  HGB 7.8* 7.9*  HCT 24.2* 25.0*  PLT 175 178    BMET  Recent Labs  11/30/12 0500 12/01/12 0500  NA 139 135  K 3.4* 3.7  CL 107 106  CO2 24 22  GLUCOSE 103* 76  BUN 20 18  CREATININE 1.05 1.05  CALCIUM 7.4* 7.4*   PT/INR No results found for this basename: LABPROT, INR,  in the last 72 hours   Recent Labs Lab 11/26/12 0507 11/29/12 0500  AST 75* 76*  ALT 29 28  ALKPHOS 160* 154*  BILITOT 3.2* 4.3*  PROT 6.3 6.6  ALBUMIN 1.2* 1.1*     Lipase     Component Value Date/Time   LIPASE 11 11/11/2012 1530     Studies/Results: No results found.  Medications: . albuterol  2.5 mg Nebulization Q6H  . antiseptic oral rinse  15 mL Mouth Rinse Q4H  . cholestyramine light  4 g Oral BID  . enoxaparin (LOVENOX) injection  40 mg Subcutaneous Q24H  . feeding supplement  1 Container Oral Daily  .  folic acid  1 mg Oral Daily  . furosemide  20 mg Oral Daily  . insulin aspart  0-20 Units Subcutaneous TID AC & HS  . labetalol  100 mg Oral BID  . levothyroxine  25 mcg Oral Q24H  . lip balm  1 application Topical BID  . multivitamin with minerals  1 tablet Oral Daily  . pantoprazole  40 mg Oral Daily  . QUEtiapine  25 mg Oral QHS  . sodium chloride  10-40 mL Intracatheter Q12H  . thiamine  100 mg Oral Daily   Prior to Admission medications   Medication Sig Start Date End Date Taking? Authorizing Provider  ALPRAZolam (XANAX) 0.25 MG tablet Take 0.25 mg by mouth daily as needed. For anxiety   Yes Historical Provider, MD  benazepril (LOTENSIN) 20 MG tablet TAKE 1 TABLET ONCE DAILY. 10/05/12  Yes Bruce Romilda Garret, MD  labetalol (NORMODYNE) 100 MG tablet TAKE 1 TABLET TWICE DAILY. 10/05/12  Yes Bruce Romilda Garret, MD  ondansetron (ZOFRAN) 4 MG tablet Take 4 mg by mouth every 8 (eight) hours as needed for nausea. 11/04/12  Yes John L Molpus, MD  pantoprazole (PROTONIX) 40 MG tablet Take 1 tablet (40 mg total) by mouth 2 (two) times daily. 09/22/12  Yes Shanker Levora Dredge,  MD  simvastatin (ZOCOR) 40 MG tablet Take 40 mg by mouth every evening.   Yes Historical Provider, MD      Assessment/Plan 1. Pneumoperitoneum with Perforation of jejunum of uncertain etiology with diffuse peritonitis, extensive intra-abdominal adhesions, coagulopathy, cirrhosis with ascites 1)S/p Exploratory laparotomy, debridement of abdominal wall tissue 2 cm x 8 cm, extensive lysis of adhesions requiring 45 minutes, repair small bowel perforation, enterorrhaphy x1, placement of negative pressure dressing for open abdomen. 11/12/2012, Ernestene Mention, MD. 2) Open Abdominal Wound, Jejunal Perforation,S/P Repair, Necrotic Fascia With: Washout peritoneum, Debridement of Fascia,Abdominal wall Closure,11/14/2012, Ardeth Sportsman, MD.   Post op respiratory failure/ final extubation 5/18 Left lung collapse with reintubation Post op  hypotension/Sepsis/peritonitis/shock Post op coagulopathy INR up to 3.23 on 11/12/12. Not  Coumdain Acute renal failure post op PCM started on TNA Acute encephalopathy from ETOH use Dysphonia/dysphagia C diff negative on 5/23 Pre albumin was 4.4 on 11/22/12--Malnutrition, note from nutrition suggest PO intake and supplements on DII diet. Ongoing Anemia with post op bleeding and multiple transfusions   2.Recurrent GI bleed, of uncertain etiology, foreign body found on w/u 10/07/12.  3. Prior Hernia repair with: Exploratory laparotomy, small bowel resection with primary anastomosis and primary repair of umbilical hernia. 12/09/2006, Leonie Man, M.D.  a) exploratory laparotomy with lysis of adhesions x1 hour  b) primary ventral hernia repair with suture  Surgeon: Dr. Harden Mo, 01/22/2012.  4. Recurrent GI bleed with prior enteroscopy with possible surgical clip found; full workup at [facility] pending.  5. Alcohol use with possible cirrhosis and withdrawal symptoms.  6.Brain aneurysm with coiling, complicated by CVA 2006.  7. Arthritis  8.Hypertension  9.Body mass index is 35.9   Plan:  She is profoundly weak, hypotensive, she has been getting lasix, but no dose for today ordered.  She has also been on labetalol and that is not ordered today either. PCM off TNA, will add a calorie count, hope supplements can bridge gap. Recheck prealbumin, I don't see how she can heal anything with a prealbumin of 4, will repeat. +2 assist out of bed, still has foley in place Will discuss with medicine, and see how plans for SNF are coming.  I expect a long term healing issues/medical issues.       LOS: 20 days    Karey Stucki 12/01/2012

## 2012-12-01 NOTE — Procedures (Signed)
Objective Swallowing Evaluation: Modified Barium Swallowing Study  Patient Details  Name: [name] [name] MRN: [id number] Date of Birth: Mar 01, 1946  Today's Date: 12/01/2012 Time: 1400-1420 SLP Time Calculation (min): 20 min  Past Medical History:  Past Medical History  Diagnosis Date  . Hypertension   . H/O brain surgery   . Aneurysm of anterior cerebral artery   . Hernia   . Anxiety   . Stroke   . Anemia   . Arthritis    Past Surgical History:  Past Surgical History  Procedure Laterality Date  . Laparotomy  01/22/2012    Procedure: EXPLORATORY LAPAROTOMY;  Surgeon: Emelia Loron, MD;  Location: [facility] OR;  Service: General;  Laterality: N/A;  lysis of adhesions  . Hernia repair    . Esophagogastroduodenoscopy N/A 09/19/2012    Procedure: ESOPHAGOGASTRODUODENOSCOPY (EGD);  Surgeon: Charna Kyrene, MD;  Location: [facility] ENDOSCOPY;  Service: Endoscopy;  Laterality: N/A;  . Colonoscopy N/A 09/20/2012    Procedure: COLONOSCOPY;  Surgeon: Charna Christal, MD;  Location: [facility] ENDOSCOPY;  Service: Endoscopy;  Laterality: N/A;  . Givens capsule study N/A 09/21/2012    Procedure: GIVENS CAPSULE STUDY;  Surgeon: Charna Kahla, MD;  Location: [facility] ENDOSCOPY;  Service: Endoscopy;  Laterality: N/A;  . Enteroscopy N/A 09/28/2012    Procedure: ENTEROSCOPY;  Surgeon: Rachael Fee, MD;  Location: [facility] ENDOSCOPY;  Service: Endoscopy;  Laterality: N/A;  . Upper gastrointestinal endoscopy  10/05/12  . Enteroscopy N/A 10/05/2012    Procedure: ENTEROSCOPY;  Surgeon: Meryl Dare, MD;  Location: [facility] ENDOSCOPY;  Service: Endoscopy;  Laterality: N/A;  . Laparotomy N/A 11/12/2012    Procedure: EXPLORATORY LAPAROTOMY  with repair perforated small bowel, abdominal wall debridement, application of wound vac dressing;  Surgeon: Ernestene Mention, MD;  Location: [facility] ORS;  Service: General;  Laterality: N/A;  . Wound debridement  11/14/2012    Procedure: Washout Abdominal Wound, debridement of Fascia, Partial Closure;  Surgeon:  Ardeth Sportsman, MD;  Location: [facility] ORS;  Service: General;;   HPI:  67 yo female adm to [facility] with perforation of jejunum s/p repair 11/12/12.  Pt PMH for alcohol hepatitis, ileus, ARF, alcohol abuse - ETOH w/d, anemia, CVA 08, dyspnea, cough-wheezing, ARF, asp pna, divericulosis, brain surgery, and HTN.  Pt is s/p bronch 5/16, required intubation 5/8-5/15, 5/16-5/18.  Order received for swallow evaluation which was completed on 5/19 with rec for ice only (pt had NG in place and was easily dyspneic). Diet has since been advanced to dysphagia 2 with nectar thick liquid by SLP team. ENT consult also complete which indicated immobile right vocal cord. Objective evaluation ordered to determine least restrictive diet in light of continued subtle s/s of aspiration at bedside along with suspected vagus nerve involvement.       Assessment / Plan / Recommendation Clinical Impression  Dysphagia Diagnosis: Moderate pharyngeal phase dysphagia Clinical impression: Patient presents with a moderate primarily pharyngeal phase dysphagia characterized by decreased laryngeal closure, when combined with delayed swallow initiation and significant impulsivity with self-feeding/large sips which could not be decreased despite max SLP cueing, results in silent aspiration of thin liquids along posterior tracheal wall. Cued cough unsuccessful to fully clear as patient unable to fully adduct vocal cords and cues for various head positions unsuccessful to prevent decreased airway protection. Patient was able to protect airway with solids and nectar thick liquids, requiring moderate cueing for consistent dry swallows to clear primarily vallecular residue. Recommend dysphagia 3 solids, nectar thick liquids at this time with strict  use of compensatory strategies and aspiration precautions, particularly small bite/sips and slow rate of intake. Hopeful that with time off ventilator, some recovery of right vocal cord function, and cognitive  status allowing for better use of precautions, patient will be able to advance diet. SLP will f/u. Recommend SNF SLP f/u after d/c.     Treatment Recommendation  Therapy as outlined in treatment plan below    Diet Recommendation Dysphagia 3 (Mechanical Soft);Nectar-thick liquid   Liquid Administration via: Cup;No straw Medication Administration: Whole meds with puree Supervision: Patient able to self feed;Full supervision/cueing for compensatory strategies Compensations: Slow rate;Small sips/bites;Clear throat intermittently Postural Changes and/or Swallow Maneuvers: Seated upright 90 degrees;Upright 30-60 min after meal    Other  Recommendations Oral Care Recommendations: Oral care BID Other Recommendations: Order thickener from pharmacy;Prohibited food (jello, ice cream, thin soups);Remove water pitcher   Follow Up Recommendations  Skilled Nursing facility    Frequency and Duration min 2x/week  2 weeks   Pertinent Vitals/Pain None reported    SLP Swallow Goals Patient will utilize recommended strategies during swallow to increase swallowing safety with: Maximum assistance Swallow Study Goal #2 - Progress: Progressing toward goal Goal #3: Pt will demonstration ability to phonate 4 words per breath group with adequate control to demonstrate improved airway protection.   Swallow Study Goal #3 - Progress: Progressing toward goal   General Date of Onset: 11/16/12 HPI: 67 yo female adm to [facility] with perforation of jejunum s/p repair 11/12/12.  Pt PMH for alcohol hepatitis, ileus, ARF, alcohol abuse - ETOH w/d, anemia, CVA 08, dyspnea, cough-wheezing, ARF, asp pna, divericulosis, brain surgery, and HTN.  Pt is s/p bronch 5/16, required intubation 5/8-5/15, 5/16-5/18.  Order received for swallow evaluation which was completed on 5/19 with rec for ice only (pt had NG in place and was easily dyspneic). Diet has since been advanced to dysphagia 2 with nectar thick liquid by SLP team. ENT consult  also complete which indicated immobile right vocal cord. Objective evaluation ordered to determine least restrictive diet in light of continued subtle s/s of aspiration at bedside along with suspected vagus nerve involvement.   Type of Study: Modified Barium Swallowing Study Reason for Referral: Objectively evaluate swallowing function Previous Swallow Assessment: see HPI Diet Prior to this Study: Dysphagia 2 (chopped);Nectar-thick liquids Temperature Spikes Noted: No Respiratory Status: Supplemental O2 delivered via (comment) (nasal cannula) History of Recent Intubation: Yes Length of Intubations (days):  (see HPI) Date extubated: 11/22/12 Behavior/Cognition: Pleasant mood;Cooperative;Alert;Confused Oral Cavity - Dentition: Adequate natural dentition Oral Motor / Sensory Function:  (see bedside swallow evaluation) Self-Feeding Abilities: Able to feed self Patient Positioning: Upright in chair Baseline Vocal Quality: Aphonic (minimal audible phonation noted) Volitional Cough: Weak Volitional Swallow: Able to elicit Anatomy: Within functional limits Pharyngeal Secretions: Not observed secondary MBS    Reason for Referral Objectively evaluate swallowing function   Oral Phase Oral Preparation/Oral Phase Oral Phase: WFL   Pharyngeal Phase Pharyngeal Phase Pharyngeal Phase: Impaired Pharyngeal - Nectar Pharyngeal - Nectar Teaspoon: Reduced tongue base retraction;Reduced airway/laryngeal closure;Delayed swallow initiation;Premature spillage to valleculae;Pharyngeal residue - valleculae Pharyngeal - Nectar Cup: Reduced tongue base retraction;Reduced airway/laryngeal closure;Delayed swallow initiation;Premature spillage to valleculae;Pharyngeal residue - valleculae Pharyngeal - Thin Pharyngeal - Thin Cup: Reduced tongue base retraction;Reduced airway/laryngeal closure;Delayed swallow initiation;Pharyngeal residue - valleculae;Premature spillage to pyriform sinuses;Penetration/Aspiration before  swallow;Moderate aspiration;Compensatory strategies attempted (Comment) (right head turn does not prevent aspiration) Penetration/Aspiration details (thin cup): Material enters airway, passes BELOW cords without attempt by patient to eject  out (silent aspiration) (silent aspiration) Pharyngeal - Solids Pharyngeal - Puree: Delayed swallow initiation;Premature spillage to valleculae;Pharyngeal residue - valleculae;Reduced tongue base retraction Pharyngeal - Mechanical Soft: Delayed swallow initiation;Premature spillage to valleculae;Reduced tongue base retraction;Pharyngeal residue - valleculae  Cervical Esophageal Phase    GO    Cervical Esophageal Phase Cervical Esophageal Phase: [facility]        Ferdinand Lango MA, CCC-SLP [phone]  Emmah Bratcher Meryl 12/01/2012, 2:48 PM

## 2012-12-02 ENCOUNTER — Encounter (HOSPITAL_COMMUNITY): Payer: Self-pay | Admitting: *Deleted

## 2012-12-02 LAB — BASIC METABOLIC PANEL
BUN: 15 mg/dL (ref 6–23)
Chloride: 107 mEq/L (ref 96–112)
Creatinine, Ser: 0.93 mg/dL (ref 0.50–1.10)
GFR calc Af Amer: 74 mL/min — ABNORMAL LOW (ref >90)
GFR calc non Af Amer: 64 mL/min — ABNORMAL LOW (ref >90)

## 2012-12-02 LAB — GLUCOSE, CAPILLARY
Glucose-Capillary: 98 mg/dL (ref 70–99)
Glucose-Capillary: 129 mg/dL — ABNORMAL HIGH (ref 70–99)

## 2012-12-02 LAB — CBC
HCT: 25.4 % — ABNORMAL LOW (ref 36.0–46.0)
MCHC: 30.7 g/dL (ref 30.0–36.0)
MCV: 95.1 fL (ref 78.0–100.0)
RDW: 21.2 % — ABNORMAL HIGH (ref 11.5–15.5)

## 2012-12-02 MED ORDER — ALBUTEROL SULFATE (5 MG/ML) 0.5% IN NEBU
2.5000 mg | INHALATION_SOLUTION | Freq: Three times a day (TID) | RESPIRATORY_TRACT | Status: DC
  Administered 2012-12-02 – 2012-12-03 (×5): 2.5 mg via RESPIRATORY_TRACT
  Filled 2012-12-02 (×4): qty 0.5

## 2012-12-02 MED ORDER — BOOST / RESOURCE BREEZE PO LIQD
1.0000 | Freq: Two times a day (BID) | ORAL | Status: DC
  Administered 2012-12-03 (×2): 1 via ORAL

## 2012-12-02 NOTE — Progress Notes (Signed)
Patient rested well last night. Abdominal dressing was changed. Will continue to monitor patient.

## 2012-12-02 NOTE — Progress Notes (Signed)
CSW met with spouse to discuss that [facility] has denied patient. Discussed bed offers. Spouse agreeable to [facility] . Notified [facility] to begin authorization process.  Chiamaka Latka C. Kristain Filo MSW, LCSW [phone]

## 2012-12-02 NOTE — Progress Notes (Signed)
General Surgery [facility]  Reviewed and agree.  Ongoing wound care.  Follow up at [facility] office 2 weeks for wound check.  Velora Heckler, MD, [facility] Office: [phone]

## 2012-12-02 NOTE — Progress Notes (Signed)
Physical Therapy Treatment Patient Details Name: [name] [name] MRN: [id number] DOB: 1946/06/14 Today's Date: 12/02/2012 Time: 4098-1191 PT Time Calculation (min): 27 min  PT Assessment / Plan / Recommendation Comments on Treatment Session  Pt making good progress with ambulation distance today however still requires multimodal cues for safety and technique.  SaO2 on room air 96% at rest. SaO2 93% room air during ambulation however pt does report SOB and required 4 seated rest breaks.  Pt hopeful for d/c to SNF tomorrow..    Follow Up Recommendations  SNF;Supervision/Assistance - 24 hour     Does the patient have the potential to tolerate intense rehabilitation     Barriers to Discharge        Equipment Recommendations  None recommended by PT    Recommendations for Other Services    Frequency     Plan Discharge plan remains appropriate;Frequency remains appropriate    Precautions / Restrictions Precautions Precautions: Fall Precaution Comments: abdominal binder discontinued per RN   Pertinent Vitals/Pain Pt denies pain.  Comfortable at rest.  Pt left in bed with RN and RT.    Mobility  Bed Mobility Bed Mobility: Sit to Supine Sit to Supine: 3: Mod assist;HOB flat Details for Bed Mobility Assistance: discussed log roll technique due to abdominal surgery however pt just began lowering trunk and required assist for LEs onto bed Transfers Transfers: Sit to Stand;Stand to Sit Sit to Stand: 4: Min assist;With upper extremity assist;From chair/3-in-1 Stand to Sit: 4: Min assist;With upper extremity assist;To bed;To chair/3-in-1 Details for Transfer Assistance: min assist to rise however progressed to min/guard, multimodal cues for hand placement and safe technique required with each transfer Ambulation/Gait Ambulation/Gait Assistance: 4: Min assist Ambulation Distance (Feet): 50 Feet (total) Assistive device: Rolling walker Ambulation/Gait Assistance Details: +2 for safety  and chair following, pt required increased multimodal cues for safe use of RW, pt fatigues quickly and reported SOB however SaO2 93% room air during ambulation Gait Pattern: Decreased step length - right;Decreased step length - left;Step-to pattern;Shuffle;Narrow base of support General Gait Details: required 4 seated rest breaks due to fatigue, tends to stay toward R side of RW despite multimodal cues    Exercises     PT Diagnosis:    PT Problem List:   PT Treatment Interventions:     PT Goals Acute Rehab PT Goals PT Goal: Sit to Supine/Side - Progress: Partly met Pt will go Sit to Stand: with supervision PT Goal: Sit to Stand - Progress: Updated due to goal met Pt will Ambulate: 16 - 50 feet;with supervision;with rolling walker PT Goal: Ambulate - Progress: Updated due to goal met  Visit Information  Last PT Received On: 12/02/12 Assistance Needed: +2    Subjective Data  Subjective: pt pleased with progress today   Cognition  Cognition Arousal/Alertness: Awake/alert Behavior During Therapy: WFL for tasks assessed/performed Overall Cognitive Status: Impaired/Different from baseline Area of Impairment: Safety/judgement;Attention Safety/Judgement: Decreased awareness of safety;Decreased awareness of deficits General Comments: pt attempts to stand without assist     Balance     End of Session PT - End of Session Activity Tolerance: Patient limited by fatigue Patient left: in bed;with call bell/phone within reach;with nursing in room Nurse Communication:  (RN assisted with chair)   GP     Maida Sale E 12/02/2012, 4:22 PM Zenovia Jarred, PT, DPT 12/02/2012 Pager: [phone]

## 2012-12-02 NOTE — Progress Notes (Signed)
Speech Language Pathology Dysphagia Treatment Patient Details Name: [name] [name] MRN: [id number] DOB: 1946-05-18 Today's Date: 12/02/2012 Time: 5956-3875 SLP Time Calculation (min): 12 min  Assessment / Plan / Recommendation Clinical Impression  F/u after yesterday's MBS.  Spouse present; awaiting D/C to SNF. Reviewed findings and recs after MBS.  Pt with difficulty recalling verbal information/precautions; discussed vocal cord dysfunction and impact on impaired  voice and airway protection.  Pt observed to consume nectar-thick liquids - tolerated very well with no signs of airway compromise.  Mod cues for precautions.   Continue DYS 3 diet, nectars at D/C; SLP f/u at facility.      Diet Recommendation  Continue with Current Diet: Dysphagia 3 (mechanical soft);Nectar-thick liquid          Swallowing Goals  SLP Swallowing Goals Patient will utilize recommended strategies during swallow to increase swallowing safety with: Moderate assistance Swallow Study Goal #2 - Progress: Progressing toward goal  General Temperature Spikes Noted: No Respiratory Status: Supplemental O2 delivered via (comment) Behavior/Cognition: Pleasant mood;Cooperative;Alert;Confused Oral Cavity - Dentition: Adequate natural dentition Patient Positioning: Upright in chair  Oral Cavity - Oral Hygiene Does patient have any of the following "at risk" factors?: Diet - patient on thickened liquids Patient is AT RISK - Oral Care Protocol followed (see row info): Yes   Dysphagia Treatment Treatment focused on: Skilled observation of diet tolerance;Patient/family/caregiver education Family/Caregiver Educated: husband Treatment Methods/Modalities: Skilled observation Patient observed directly with PO's: Yes Type of PO's observed: Dysphagia 3 (soft);Nectar-thick liquids Feeding: Able to feed self Liquids provided via: Cup Type of cueing: Verbal Amount of cueing: Minimal   GO     [name] [name]  [name] [name] 12/02/2012, 1:38 PM

## 2012-12-02 NOTE — Progress Notes (Signed)
18 Days Post-Op  Subjective: No real change, they are planning to send her to SNF today. She is trying to eat more, still major effort to get her up.  Objective: Vital signs in last 24 hours: Temp:  [98.1 F (36.7 C)-98.8 F (37.1 C)] 98.2 F (36.8 C) (05/28 0536) Pulse Rate:  [79-93] 79 (05/28 0536) Resp:  [18-19] 18 (05/28 0536) BP: (90-105)/(49-66) 90/66 mmHg (05/28 0536) SpO2:  [93 %-100 %] 93 % (05/28 0742) Last BM Date: 12/01/12 480 PO recorded,  NO BM recorded. Afebrile, VS: BP is still mostly in 90 range, HR varies 70-90 range CBC/BMP stable, prealbumin is 2.2 Going to DIII diet after swallowing study.   Intake/Output from previous day: 05/27 0701 - 05/28 0700 In: 490 [P.O.:480; I.V.:10] Out: 675 [Urine:675] Intake/Output this shift:    General appearance: alert, cooperative and no distress Resp: still has course breath sounds. GI: soft, +BS, wound is about the same, with necrotic tissue at the mid base.  Sides are OK.  Lab Results:   Recent Labs  12/01/12 0500 12/02/12 0425  WBC 7.9 9.4  HGB 7.9* 7.8*  HCT 25.0* 25.4*  PLT 178 174    BMET  Recent Labs  12/01/12 0500 12/02/12 0425  NA 135 137  K 3.7 3.7  CL 106 107  CO2 22 23  GLUCOSE 76 121*  BUN 18 15  CREATININE 1.05 0.93  CALCIUM 7.4* 7.4*   PT/INR No results found for this basename: LABPROT, INR,  in the last 72 hours   Recent Labs Lab 11/26/12 0507 11/29/12 0500  AST 75* 76*  ALT 29 28  ALKPHOS 160* 154*  BILITOT 3.2* 4.3*  PROT 6.3 6.6  ALBUMIN 1.2* 1.1*     Lipase     Component Value Date/Time   LIPASE 11 11/11/2012 1530     Studies/Results: Dg Swallowing Func-speech Pathology  12/01/2012   Leah Meryl McCoy, CCC-SLP     12/01/2012  2:48 PM Objective Swallowing Evaluation: Modified Barium Swallowing Study   Patient Details  Name: [name] [name] MRN: [id number] Date of Birth: 1946-05-05  Today's Date: 12/01/2012 Time: 1400-1420 SLP Time Calculation (min): 20 min  Past  Medical History:  Past Medical History  Diagnosis Date  . Hypertension   . H/O brain surgery   . Aneurysm of anterior cerebral artery   . Hernia   . Anxiety   . Stroke   . Anemia   . Arthritis    Past Surgical History:  Past Surgical History  Procedure Laterality Date  . Laparotomy  01/22/2012    Procedure: EXPLORATORY LAPAROTOMY;  Surgeon: Emelia Loron,  MD;  Location[facility];  Service: General;  Laterality: N/A;  lysis  of adhesions  . Hernia repair    . Esophagogastroduodenoscopy N/A 09/19/2012    Procedure: ESOPHAGOGASTRODUODENOSCOPY (EGD);  Surgeon: Charna Lynett, MD;  Location: [facility] ENDOSCOPY;  Service: Endoscopy;   Laterality: N/A;  . Colonoscopy N/A 09/20/2012    Procedure: COLONOSCOPY;  Surgeon: Charna Velmer, MD;  Location:  [facility] ENDOSCOPY;  Service: Endoscopy;  Laterality: N/A;  . Givens capsule study N/A 09/21/2012    Procedure: GIVENS CAPSULE STUDY;  Surgeon: Charna Casandra, MD;   Location: [facility] ENDOSCOPY;  Service: Endoscopy;  Laterality: N/A;  . Enteroscopy N/A 09/28/2012    Procedure: ENTEROSCOPY;  Surgeon: Rachael Fee, MD;   Location: [facility] ENDOSCOPY;  Service: Endoscopy;  Laterality: N/A;  . Upper gastrointestinal endoscopy  10/05/12  . Enteroscopy N/A 10/05/2012    Procedure:  ENTEROSCOPY;  Surgeon: Meryl Dare, MD;   Location: [facility] ENDOSCOPY;  Service: Endoscopy;  Laterality: N/A;  . Laparotomy N/A 11/12/2012    Procedure: EXPLORATORY LAPAROTOMY  with repair perforated small  bowel, abdominal wall debridement, application of wound vac  dressing;  Surgeon: Ernestene Mention, MD;  Location: [facility] ORS;   Service: General;  Laterality: N/A;  . Wound debridement  11/14/2012    Procedure: Washout Abdominal Wound, debridement of Fascia,  Partial Closure;  Surgeon: Ardeth Sportsman, MD;  Location: [facility]  ORS;  Service: General;;   HPI:  67 yo female adm to [facility] with perforation of jejunum s/p repair  11/12/12.  Pt PMH for alcohol hepatitis, ileus, ARF, alcohol abuse  - ETOH w/d, anemia, CVA 08, dyspnea, cough-wheezing, ARF, asp   pna, divericulosis, brain surgery, and HTN.  Pt is s/p bronch  5/16, required intubation 5/8-5/15, 5/16-5/18.  Order received  for swallow evaluation which was completed on 5/19 with rec for  ice only (pt had NG in place and was easily dyspneic). Diet has  since been advanced to dysphagia 2 with nectar thick liquid by  SLP team. ENT consult also complete which indicated immobile  right vocal cord. Objective evaluation ordered to determine least  restrictive diet in light of continued subtle s/s of aspiration  at bedside along with suspected vagus nerve involvement.       Assessment / Plan / Recommendation Clinical Impression  Dysphagia Diagnosis: Moderate pharyngeal phase dysphagia Clinical impression: Patient presents with a moderate primarily  pharyngeal phase dysphagia characterized by decreased laryngeal  closure, when combined with delayed swallow initiation and  significant impulsivity with self-feeding/large sips which could  not be decreased despite max SLP cueing, results in silent  aspiration of thin liquids along posterior tracheal wall. Cued  cough unsuccessful to fully clear as patient unable to fully  adduct vocal cords and cues for various head positions  unsuccessful to prevent decreased airway protection. Patient was  able to protect airway with solids and nectar thick liquids,  requiring moderate cueing for consistent dry swallows to clear  primarily vallecular residue. Recommend dysphagia 3 solids,  nectar thick liquids at this time with strict use of compensatory  strategies and aspiration precautions, particularly small  bite/sips and slow rate of intake. Hopeful that with time off  ventilator, some recovery of right vocal cord function, and  cognitive status allowing for better use of precautions, patient  will be able to advance diet. SLP will f/u. Recommend SNF SLP f/u  after d/c.     Treatment Recommendation  Therapy as outlined in treatment plan below    Diet Recommendation Dysphagia 3  (Mechanical Soft);Nectar-thick  liquid   Liquid Administration via: Cup;No straw Medication Administration: Whole meds with puree Supervision: Patient able to self feed;Full supervision/cueing  for compensatory strategies Compensations: Slow rate;Small sips/bites;Clear throat  intermittently Postural Changes and/or Swallow Maneuvers: Seated upright 90  degrees;Upright 30-60 min after meal    Other  Recommendations Oral Care Recommendations: Oral care BID Other Recommendations: Order thickener from pharmacy;Prohibited  food (jello, ice cream, thin soups);Remove water pitcher   Follow Up Recommendations  Skilled Nursing facility    Frequency and Duration min 2x/week  2 weeks   Pertinent Vitals/Pain None reported    SLP Swallow Goals Patient will utilize recommended strategies during swallow to  increase swallowing safety with: Maximum assistance Swallow Study Goal #2 - Progress: Progressing toward goal Goal #3: Pt will demonstration ability to phonate 4 words per  breath group with adequate control to demonstrate improved airway  protection.   Swallow Study Goal #3 - Progress: Progressing toward goal   General Date of Onset: 11/16/12 HPI: 67 yo female adm to [facility] with perforation of jejunum s/p  repair 11/12/12.  Pt PMH for alcohol hepatitis, ileus, ARF, alcohol  abuse - ETOH w/d, anemia, CVA 08, dyspnea, cough-wheezing, ARF,  asp pna, divericulosis, brain surgery, and HTN.  Pt is s/p bronch  5/16, required intubation 5/8-5/15, 5/16-5/18.  Order received  for swallow evaluation which was completed on 5/19 with rec for  ice only (pt had NG in place and was easily dyspneic). Diet has  since been advanced to dysphagia 2 with nectar thick liquid by  SLP team. ENT consult also complete which indicated immobile  right vocal cord. Objective evaluation ordered to determine least  restrictive diet in light of continued subtle s/s of aspiration  at bedside along with suspected vagus nerve involvement.   Type of Study: Modified  Barium Swallowing Study Reason for Referral: Objectively evaluate swallowing function Previous Swallow Assessment: see HPI Diet Prior to this Study: Dysphagia 2 (chopped);Nectar-thick  liquids Temperature Spikes Noted: No Respiratory Status: Supplemental O2 delivered via (comment)  (nasal cannula) History of Recent Intubation: Yes Length of Intubations (days):  (see HPI) Date extubated: 11/22/12 Behavior/Cognition: Pleasant mood;Cooperative;Alert;Confused Oral Cavity - Dentition: Adequate natural dentition Oral Motor / Sensory Function:  (see bedside swallow evaluation) Self-Feeding Abilities: Able to feed self Patient Positioning: Upright in chair Baseline Vocal Quality: Aphonic (minimal audible phonation noted) Volitional Cough: Weak Volitional Swallow: Able to elicit Anatomy: Within functional limits Pharyngeal Secretions: Not observed secondary MBS    Reason for Referral Objectively evaluate swallowing function   Oral Phase Oral Preparation/Oral Phase Oral Phase: WFL   Pharyngeal Phase Pharyngeal Phase Pharyngeal Phase: Impaired Pharyngeal - Nectar Pharyngeal - Nectar Teaspoon: Reduced tongue base  retraction;Reduced airway/laryngeal closure;Delayed swallow  initiation;Premature spillage to valleculae;Pharyngeal residue -  valleculae Pharyngeal - Nectar Cup: Reduced tongue base retraction;Reduced  airway/laryngeal closure;Delayed swallow initiation;Premature  spillage to valleculae;Pharyngeal residue - valleculae Pharyngeal - Thin Pharyngeal - Thin Cup: Reduced tongue base retraction;Reduced  airway/laryngeal closure;Delayed swallow initiation;Pharyngeal  residue - valleculae;Premature spillage to pyriform  sinuses;Penetration/Aspiration before swallow;Moderate  aspiration;Compensatory strategies attempted (Comment) (right  head turn does not prevent aspiration) Penetration/Aspiration details (thin cup): Material enters  airway, passes BELOW cords without attempt by patient to eject  out (silent aspiration)  (silent aspiration) Pharyngeal - Solids Pharyngeal - Puree: Delayed swallow initiation;Premature spillage  to valleculae;Pharyngeal residue - valleculae;Reduced tongue base  retraction Pharyngeal - Mechanical Soft: Delayed swallow  initiation;Premature spillage to valleculae;Reduced tongue base  retraction;Pharyngeal residue - valleculae  Cervical Esophageal Phase    GO    Cervical Esophageal Phase Cervical Esophageal Phase: [facility]        Leah McCoy MA, CCC-SLP [phone]  McCoy Leah Meryl 12/01/2012, 2:48 PM     Medications: . albuterol  2.5 mg Nebulization TID  . antiseptic oral rinse  15 mL Mouth Rinse Q4H  . cholestyramine light  4 g Oral BID  . enoxaparin (LOVENOX) injection  40 mg Subcutaneous Q24H  . feeding supplement  1 Container Oral Daily  . folic acid  1 mg Oral Daily  . insulin aspart  0-20 Units Subcutaneous TID AC & HS  . levothyroxine  25 mcg Oral Q24H  . lip balm  1 application Topical BID  . multivitamin with minerals  1 tablet Oral Daily  . pantoprazole  40 mg Oral  Daily  . QUEtiapine  25 mg Oral QHS  . sodium chloride  10-40 mL Intracatheter Q12H  . thiamine  100 mg Oral Daily    Assessment/Plan 1. Pneumoperitoneum with Perforation of jejunum of uncertain etiology with diffuse peritonitis, extensive intra-abdominal adhesions, coagulopathy, cirrhosis with ascites  1)S/p Exploratory laparotomy, debridement of abdominal wall tissue 2 cm x 8 cm, extensive lysis of adhesions requiring 45 minutes, repair small bowel perforation, enterorrhaphy x1, placement of negative pressure dressing for open abdomen. 11/12/2012, Ernestene Mention, MD.  2) Open Abdominal Wound, Jejunal Perforation,S/P Repair, Necrotic Fascia  With: Washout peritoneum, Debridement of Fascia,Abdominal wall Closure,11/14/2012, Ardeth Sportsman, MD.  Post op respiratory failure/ final extubation 5/18  Left lung collapse with reintubation  Post op hypotension/Sepsis/peritonitis/shock  Post op coagulopathy INR up  to 3.23 on 11/12/12. 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 Ongoing Anemia with post op bleeding and multiple transfusions  2.Recurrent GI bleed, of uncertain etiology, foreign body found on w/u 10/07/12.  3. Prior Hernia repair with: Exploratory laparotomy, small bowel resection with primary anastomosis and primary repair of umbilical hernia. 12/09/2006, Leonie Man, M.D.  a) exploratory laparotomy with lysis of adhesions x1 hour  b) primary ventral hernia repair with suture  Surgeon: Dr. Harden Mo, 01/22/2012.  4. Recurrent GI bleed with prior enteroscopy with possible surgical clip found; full workup at [facility] pending.  5. Alcohol use with possible cirrhosis and withdrawal symptoms.  6.Brain aneurysm with coiling, complicated by CVA 2006.  7. Arthritis  8.Hypertension  9.Body mass index is 35.9   Plan:  To SNF, Follow up in our office in 2 weeks sooner if there is an issue.  LOS: 21 days    Tiny Rietz 12/02/2012

## 2012-12-02 NOTE — Progress Notes (Signed)
Patient ID: [name] [name], female   DOB: Oct 23, 1945, 67 y.o.   MRN: [id number]  [facility] PROGRESS NOTE  TAKERA RAYL WUJ:[id number] DOB: [birth year] DOA: 11/11/2012 PCP: Judie Petit, MD  Brief narrative:  67 yo female with alcoholic liver disease was admitted on 5/7 with abdominal pain and was found on 5/8 to have a small bowel perforation. She was taken to the OR and returned to the ICU with an open wound and mechanically ventilated.    Principal problems:  Respiratory failure-postoperative  - Status post extubation on 5/18, clinically stable and maintaining oxygen saturations at target range  - Continue nebulizer treatments every 6 hours  - ENT consulted for further management of dysphonia and dysphagia - status post transnasal fiberoptic laryngoscopy 11/30/2012 (Dr. 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, high risk aspiration and will need recommendation from speech pathology in terms of proper diet  - ENT recommended outpatient follow up  - dysphagia III diet recommended   Sepsis/septic shock secondary to peritonitis  - Off vasopressors, BP initially improved but over the past 24 hours relatively soft, will hold Lasix and Labetalol for now - if BP improved one medication at the time may be added - last ECHO in 2007 showed EF of 55-65% with some mitral valve calcification   Acute kidney failure  - likely pre renal in etiology  - now resolved, will have to hold Lasix as noted above due to soft BP  Anemia of chronic disease  - stable over 24 hours, if Hg < 7.5 in AM will plan on transfusion of PRBC   Severe protein calorie malnutrition  - pt is at high risk for aspiration, continue dysphagia III diet as recommended after SLP and MBS evaluation   Alcoholic cirrhosis and Acute alcoholic hepatitis.  - pt denies drinking, cessation discussed   Hypokalemia  - supplemented, repeat BMP in  AM  Transaminitis  - alcoholic hepatitis, improving   Perforated viscus in jejunem s/p repair 5/8.  - s/p washout abdominal wound, debridement of fascia, partial closure  - appreciate surgery input    Hypothyroidism  - continue Synthroid   Acute encephalopathy  - resolved but still somewhat confused at times - continue thiamine, folate secondary to alcohol abuse history   SIGNIFICANT EVENTS / STUDIES:  5/7 Admitted with abdominal pain, nausea, vomiting  5/7 OR >>> Perforated viscus, peritonitis, lysis of adhesions, small bowel repair  5/16 Reintubated r/t complete left sided collapse, increased WBC  5/18 Extubated, WBC decreased, TF started  5/20 Speech assessment >> D1 diet  5/22 D/c TPN, transfer to non tele floor bed  5/26 - ENT consult --> status post transnasal fiberoptic laryngoscopy (Dr. Jenne Pane)- findings consistent with immobile right vocal fold and large glottal gap with phonation, high risk aspiration and will need recommendation from speech pathology in terms of proper diet  LINES / TUBES:  OETT 5/8 >>>5/15>>>Reintubated 5/16>>> 5/18  NGT 5/8 >>> 5/20  Foley 5/8 >>>  R rad A-line 5/8 >>>5/12>>>5/16>>>5/18  L IJ CVL 5/8 >>>5/19  R Brach PICC>>>5/19  CULTURES:  5/8 Blood >>>negative  5/7 Urine >>>negative  Blood 5/15>>>neg  Urine 5/15>>>neg  Sputum 5/15>>>neg  Sputum via bronch 5/16>>> Neg  ANTIBIOTICS:  Aztreonam 5/8 >>>5/9  Flagyl 5/8 >>>5/9  Levaquin 5/8 x 1  Cipro 5/8 x 1  ertapenem 5/9>>>5/16  Vanc 5/15 >>>5/19  Primaxin 5/16 >>>5/21  Consultants:  Surgery   Code Status: Full  Family  Communication: husband at bedside Disposition Plan: SNF   HPI/Subjective: Feeling well   Objective: Filed Vitals:   12/01/12 2158 12/02/12 0235 12/02/12 0536 12/02/12 0742  BP: 105/64  90/66   Pulse: 93  79   Temp: 98.8 F (37.1 C)  98.2 F (36.8 C)   TempSrc: Oral  Oral   Resp: 18  18   Height:      Weight:      SpO2: 97% 94% 98% 93%    Intake/Output  Summary (Last 24 hours) at 12/02/12 1131 Last data filed at 12/02/12 0600  Gross per 24 hour  Intake    370 ml  Output    675 ml  Net   -305 ml    Exam:   General:  Pt is alert, follows commands appropriately, not in acute distress, very frail appearing, still dysphonic   Cardiovascular: Regular rate and rhythm, S1/S2, no rubs, no gallops  Respiratory: Clear to auscultation bilaterally, diminished breath sounds at bases- lots of upper airway sounds  Abdomen: Soft, non tender, non distended, bowel sounds present, no guarding  Extremities: Trace bilateral pitting edema, pulses DP and PT palpable bilaterally  Neuro: Grossly nonfocal  Data Reviewed: Basic Metabolic Panel:  Recent Labs Lab 11/26/12 0507 11/27/12 0420 11/29/12 0500 11/30/12 0500 12/01/12 0500 12/02/12 0425  NA 141 141 139 139 135 137  K 3.6 3.5 3.1* 3.4* 3.7 3.7  CL 107 106 107 107 106 107  CO2 29 28 25 24 22 23   GLUCOSE 112* 100* 94 103* 76 121*  BUN 25* 26* 19 20 18 15   CREATININE 0.79 1.03 0.92 1.05 1.05 0.93  CALCIUM 7.8* 7.4* 7.3* 7.4* 7.4* 7.4*  MG 2.2  --   --   --   --   --   PHOS 3.8  --   --   --   --   --    Liver Function Tests:  Recent Labs Lab 11/26/12 0507 11/29/12 0500  AST 75* 76*  ALT 29 28  ALKPHOS 160* 154*  BILITOT 3.2* 4.3*  PROT 6.3 6.6  ALBUMIN 1.2* 1.1*   CBC:  Recent Labs Lab 11/28/12 0425 11/29/12 0500 11/30/12 0500 12/01/12 0500 12/02/12 0425  WBC 8.5 8.5 7.7 7.9 9.4  HGB 8.0* 8.0* 7.8* 7.9* 7.8*  HCT 26.0* 25.7* 24.2* 25.0* 25.4*  MCV 94.5 94.5 94.2 94.7 95.1  PLT 171 172 175 178 174   CBG:  Recent Labs Lab 12/01/12 0727 12/01/12 1200 12/01/12 1644 12/01/12 2104 12/02/12 0731  GLUCAP 74 110* 138* 100* 93    Recent Results (from the past 240 hour(s))  CLOSTRIDIUM DIFFICILE BY PCR     Status: None   Collection Time    11/27/12  3:20 AM      Result Value Range Status   C difficile by pcr NEGATIVE  NEGATIVE Final       Continuous  Infusions:   Marlin Canary, DO  [facility] Pager [phone]  If 7PM-7AM, please contact night-coverage [email or web address] Password [email or web address] 12/02/2012, 11:31 AM   LOS: 21 days

## 2012-12-02 NOTE — Progress Notes (Signed)
CSW met with patient's spouse at bedside. Discussed that [facility] has declined patient. Revisited bed offers. Spouse agreeable to [facility] [place]. [facility]ified [facility] to begin [place] process.  Arnol Mcgibbon C. Ylonda Storr MSW, LCSW [phone]

## 2012-12-02 NOTE — Progress Notes (Signed)
Pt. Walked with PT without oxygen and maintained an oxygen saturation level greater then 93%. Left Oxygen off.

## 2012-12-02 NOTE — Progress Notes (Signed)
Calorie Count Note  48 hour calorie count ordered.  Diet: Dysphagia II- Nectar thick liquids Supplements: Network engineer, Magic Cup  Breakfast: 250 kcal, 8 grams protein Lunch: 52 kcal, 2 grams protein Dinner: 180 kcal, 8 grams protein Supplements: 290 kcal, 9 grams  Pt is mostly drinking fluids and is eating very little solid food.  Total intake: 772 kcal (53% of minimum estimated needs)  27 protein (32% of minimum estimated needs)  Nutrition Dx: Inadequate oral intake related to poor appetite as evidenced by <50% meal intake; ongoing  Goal: Pt to meet >/= 90% of their estimated nutrition needs; not met  Intervention: Increase Carnation Instant Breakfast to BID Provide Magic Cup once daily Increase Resource Breeze to BID Continue Multivitamin with minerals daily  [name] [name] RD, LDN Inpatient Clinical Dietitian Pager: [phone] After Hours Pager: [phone]

## 2012-12-03 LAB — GLUCOSE, CAPILLARY: Glucose-Capillary: 98 mg/dL (ref 70–99)

## 2012-12-03 LAB — CBC
MCH: 29.1 pg (ref 26.0–34.0)
MCHC: 30.9 g/dL (ref 30.0–36.0)
Platelets: 203 10*3/uL (ref 150–400)
RBC: 2.78 MIL/uL — ABNORMAL LOW (ref 3.87–5.11)

## 2012-12-03 LAB — BASIC METABOLIC PANEL
Calcium: 7.5 mg/dL — ABNORMAL LOW (ref 8.4–10.5)
GFR calc Af Amer: 83 mL/min — ABNORMAL LOW (ref >90)
GFR calc non Af Amer: 72 mL/min — ABNORMAL LOW (ref >90)
Sodium: 136 mEq/L (ref 135–145)

## 2012-12-03 MED ORDER — BOOST / RESOURCE BREEZE PO LIQD: 1.0000 | Freq: Two times a day (BID) | ORAL | Status: DC

## 2012-12-03 MED ORDER — QUETIAPINE FUMARATE 25 MG PO TABS: 25.0000 mg | ORAL_TABLET | Freq: Every day | ORAL | Status: DC

## 2012-12-03 MED ORDER — FOLIC ACID 1 MG PO TABS: 1.0000 mg | ORAL_TABLET | Freq: Every day | ORAL | Status: DC

## 2012-12-03 MED ORDER — INSULIN ASPART 100 UNIT/ML ~~LOC~~ SOLN: 0.0000 [IU] | Freq: Three times a day (TID) | SUBCUTANEOUS | Status: DC

## 2012-12-03 MED ORDER — ALBUTEROL SULFATE (5 MG/ML) 0.5% IN NEBU: 2.5000 mg | INHALATION_SOLUTION | Freq: Three times a day (TID) | RESPIRATORY_TRACT | Status: DC

## 2012-12-03 MED ORDER — CHOLESTYRAMINE LIGHT 4 G PO PACK: 4.0000 g | PACK | Freq: Two times a day (BID) | ORAL | Status: DC

## 2012-12-03 MED ORDER — BIOTENE DRY MOUTH MT LIQD: 15.0000 mL | OROMUCOSAL | Status: DC

## 2012-12-03 MED ORDER — RESOURCE THICKENUP CLEAR PO POWD: ORAL | Status: DC

## 2012-12-03 MED ORDER — THIAMINE HCL 100 MG PO TABS: 100.0000 mg | ORAL_TABLET | Freq: Every day | ORAL | Status: DC

## 2012-12-03 MED ORDER — FUROSEMIDE 20 MG PO TABS: 20.0000 mg | ORAL_TABLET | Freq: Every day | ORAL | Status: DC

## 2012-12-03 MED ORDER — ADULT MULTIVITAMIN W/MINERALS CH: 1.0000 | ORAL_TABLET | Freq: Every day | ORAL | Status: DC

## 2012-12-03 MED ORDER — FUROSEMIDE 10 MG/ML IJ SOLN
20.0000 mg | Freq: Once | INTRAMUSCULAR | Status: AC
  Administered 2012-12-03: 20 mg via INTRAVENOUS

## 2012-12-03 MED ORDER — FUROSEMIDE 20 MG PO TABS
20.0000 mg | ORAL_TABLET | Freq: Every day | ORAL | Status: DC
  Filled 2012-12-03: qty 1

## 2012-12-03 MED ORDER — LEVOTHYROXINE SODIUM 25 MCG PO TABS: 25.0000 ug | ORAL_TABLET | ORAL | Status: DC

## 2012-12-03 MED ORDER — BISACODYL 10 MG RE SUPP: 10.0000 mg | Freq: Two times a day (BID) | RECTAL | Status: DC | PRN

## 2012-12-03 MED ORDER — LIP MEDEX EX OINT: 1.0000 "application " | TOPICAL_OINTMENT | Freq: Two times a day (BID) | CUTANEOUS | Status: DC

## 2012-12-03 MED ORDER — PANTOPRAZOLE SODIUM 40 MG PO TBEC: 40.0000 mg | DELAYED_RELEASE_TABLET | Freq: Every day | ORAL | Status: DC

## 2012-12-03 NOTE — Progress Notes (Signed)
19 Days Post-Op  Subjective: Continues to make slow progress, Her wounds remain unchanged. She is slated to go to SNF today. Objective: Vital signs in last 24 hours: Temp:  [97.7 F (36.5 C)-98.6 F (37 C)] 97.7 F (36.5 C) (05/29 0500) Pulse Rate:  [93-97] 94 (05/29 0500) Resp:  [19-20] 20 (05/29 0500) BP: (105-117)/(50-69) 105/50 mmHg (05/29 0500) SpO2:  [93 %-97 %] 96 % (05/29 0739) FiO2 (%):  [21 %] 21 % (05/29 0739) Last BM Date: 12/01/12 540 PO intake recorded, better.  +BM Afebrile, VSS, WBC is up some  Intake/Output from previous day: 05/28 0701 - 05/29 0700 In: 540 [P.O.:540] Out: 451 [Urine:450; Emesis/NG output:1] Intake/Output this shift: Total I/O In: 120 [P.O.:120] Out: -   General appearance: alert, cooperative and no distress GI: No change from yesterday.  Lab Results:   Recent Labs  12/02/12 0425 12/03/12 0410  WBC 9.4 12.1*  HGB 7.8* 8.1*  HCT 25.4* 26.2*  PLT 174 203    BMET  Recent Labs  12/02/12 0425 12/03/12 0410  NA 137 136  K 3.7 3.5  CL 107 107  CO2 23 22  GLUCOSE 121* 98  BUN 15 13  CREATININE 0.93 0.84  CALCIUM 7.4* 7.5*   PT/INR No results found for this basename: LABPROT, INR,  in the last 72 hours   Recent Labs Lab 11/29/12 0500  AST 76*  ALT 28  ALKPHOS 154*  BILITOT 4.3*  PROT 6.6  ALBUMIN 1.1*     Lipase     Component Value Date/Time   LIPASE 11 11/11/2012 1530     Studies/Results: Dg Swallowing Func-speech Pathology  12/01/2012   Leah Meryl McCoy, CCC-SLP     12/01/2012  2:48 PM Objective Swallowing Evaluation: Modified Barium Swallowing Study   Patient Details  Name: [name] [name] MRN: [id number] Date of Birth: October 15, 1945  Today's Date: 12/01/2012 Time: 1400-1420 SLP Time Calculation (min): 20 min  Past Medical History:  Past Medical History  Diagnosis Date  . Hypertension   . H/O brain surgery   . Aneurysm of anterior cerebral artery   . Hernia   . Anxiety   . Stroke   . Anemia   . Arthritis    Past  Surgical History:  Past Surgical History  Procedure Laterality Date  . Laparotomy  01/22/2012    Procedure: EXPLORATORY LAPAROTOMY;  Surgeon: Emelia Loron,  MD;  Location: [facility] OR;  Service: General;  Laterality: N/A;  lysis  of adhesions  . Hernia repair    . Esophagogastroduodenoscopy N/A 09/19/2012    Procedure: ESOPHAGOGASTRODUODENOSCOPY (EGD);  Surgeon: Charna Ahria, MD;  Location: [facility] ENDOSCOPY;  Service: Endoscopy;   Laterality: N/A;  . Colonoscopy N/A 09/20/2012    Procedure: COLONOSCOPY;  Surgeon: Charna Jacelynn, MD;  Location:  [facility] ENDOSCOPY;  Service: Endoscopy;  Laterality: N/A;  . Givens capsule study N/A 09/21/2012    Procedure: GIVENS CAPSULE STUDY;  Surgeon: Charna Karalina, MD;   Location: [facility] ENDOSCOPY;  Service: Endoscopy;  Laterality: N/A;  . Enteroscopy N/A 09/28/2012    Procedure: ENTEROSCOPY;  Surgeon: Rachael Fee, MD;   Location: [facility] ENDOSCOPY;  Service: Endoscopy;  Laterality: N/A;  . Upper gastrointestinal endoscopy  10/05/12  . Enteroscopy N/A 10/05/2012    Procedure: ENTEROSCOPY;  Surgeon: Meryl Dare, MD;   Location: [facility] ENDOSCOPY;  Service: Endoscopy;  Laterality: N/A;  . Laparotomy N/A 11/12/2012    Procedure: EXPLORATORY LAPAROTOMY  with repair perforated small  bowel, abdominal wall debridement, application of  wound vac  dressing;  Surgeon: Ernestene Mention, MD;  Location: [facility] ORS;   Service: General;  Laterality: N/A;  . Wound debridement  11/14/2012    Procedure: Washout Abdominal Wound, debridement of Fascia,  Partial Closure;  Surgeon: Ardeth Sportsman, MD;  Location: [facility]  ORS;  Service: General;;   HPI:  67 yo female adm to [facility] with perforation of jejunum s/p repair  11/12/12.  Pt PMH for alcohol hepatitis, ileus, ARF, alcohol abuse  - ETOH w/d, anemia, CVA 08, dyspnea, cough-wheezing, ARF, asp  pna, divericulosis, brain surgery, and HTN.  Pt is s/p bronch  5/16, required intubation 5/8-5/15, 5/16-5/18.  Order received  for swallow evaluation which was completed on 5/19 with rec for  ice only  (pt had NG in place and was easily dyspneic). Diet has  since been advanced to dysphagia 2 with nectar thick liquid by  SLP team. ENT consult also complete which indicated immobile  right vocal cord. Objective evaluation ordered to determine least  restrictive diet in light of continued subtle s/s of aspiration  at bedside along with suspected vagus nerve involvement.       Assessment / Plan / Recommendation Clinical Impression  Dysphagia Diagnosis: Moderate pharyngeal phase dysphagia Clinical impression: Patient presents with a moderate primarily  pharyngeal phase dysphagia characterized by decreased laryngeal  closure, when combined with delayed swallow initiation and  significant impulsivity with self-feeding/large sips which could  not be decreased despite max SLP cueing, results in silent  aspiration of thin liquids along posterior tracheal wall. Cued  cough unsuccessful to fully clear as patient unable to fully  adduct vocal cords and cues for various head positions  unsuccessful to prevent decreased airway protection. Patient was  able to protect airway with solids and nectar thick liquids,  requiring moderate cueing for consistent dry swallows to clear  primarily vallecular residue. Recommend dysphagia 3 solids,  nectar thick liquids at this time with strict use of compensatory  strategies and aspiration precautions, particularly small  bite/sips and slow rate of intake. Hopeful that with time off  ventilator, some recovery of right vocal cord function, and  cognitive status allowing for better use of precautions, patient  will be able to advance diet. SLP will f/u. Recommend SNF SLP f/u  after d/c.     Treatment Recommendation  Therapy as outlined in treatment plan below    Diet Recommendation Dysphagia 3 (Mechanical Soft);Nectar-thick  liquid   Liquid Administration via: Cup;No straw Medication Administration: Whole meds with puree Supervision: Patient able to self feed;Full supervision/cueing  for  compensatory strategies Compensations: Slow rate;Small sips/bites;Clear throat  intermittently Postural Changes and/or Swallow Maneuvers: Seated upright 90  degrees;Upright 30-60 min after meal    Other  Recommendations Oral Care Recommendations: Oral care BID Other Recommendations: Order thickener from pharmacy;Prohibited  food (jello, ice cream, thin soups);Remove water pitcher   Follow Up Recommendations  Skilled Nursing facility    Frequency and Duration min 2x/week  2 weeks   Pertinent Vitals/Pain None reported    SLP Swallow Goals Patient will utilize recommended strategies during swallow to  increase swallowing safety with: Maximum assistance Swallow Study Goal #2 - Progress: Progressing toward goal Goal #3: Pt will demonstration ability to phonate 4 words per  breath group with adequate control to demonstrate improved airway  protection.   Swallow Study Goal #3 - Progress: Progressing toward goal   General Date of Onset: 11/16/12 HPI: 67 yo female adm to [facility] with perforation of jejunum  s/p  repair 11/12/12.  Pt PMH for alcohol hepatitis, ileus, ARF, alcohol  abuse - ETOH w/d, anemia, CVA 08, dyspnea, cough-wheezing, ARF,  asp pna, divericulosis, brain surgery, and HTN.  Pt is s/p bronch  5/16, required intubation 5/8-5/15, 5/16-5/18.  Order received  for swallow evaluation which was completed on 5/19 with rec for  ice only (pt had NG in place and was easily dyspneic). Diet has  since been advanced to dysphagia 2 with nectar thick liquid by  SLP team. ENT consult also complete which indicated immobile  right vocal cord. Objective evaluation ordered to determine least  restrictive diet in light of continued subtle s/s of aspiration  at bedside along with suspected vagus nerve involvement.   Type of Study: Modified Barium Swallowing Study Reason for Referral: Objectively evaluate swallowing function Previous Swallow Assessment: see HPI Diet Prior to this Study: Dysphagia 2 (chopped);Nectar-thick  liquids  Temperature Spikes Noted: No Respiratory Status: Supplemental O2 delivered via (comment)  (nasal cannula) History of Recent Intubation: Yes Length of Intubations (days):  (see HPI) Date extubated: 11/22/12 Behavior/Cognition: Pleasant mood;Cooperative;Alert;Confused Oral Cavity - Dentition: Adequate natural dentition Oral Motor / Sensory Function:  (see bedside swallow evaluation) Self-Feeding Abilities: Able to feed self Patient Positioning: Upright in chair Baseline Vocal Quality: Aphonic (minimal audible phonation noted) Volitional Cough: Weak Volitional Swallow: Able to elicit Anatomy: Within functional limits Pharyngeal Secretions: Not observed secondary MBS    Reason for Referral Objectively evaluate swallowing function   Oral Phase Oral Preparation/Oral Phase Oral Phase: WFL   Pharyngeal Phase Pharyngeal Phase Pharyngeal Phase: Impaired Pharyngeal - Nectar Pharyngeal - Nectar Teaspoon: Reduced tongue base  retraction;Reduced airway/laryngeal closure;Delayed swallow  initiation;Premature spillage to valleculae;Pharyngeal residue -  valleculae Pharyngeal - Nectar Cup: Reduced tongue base retraction;Reduced  airway/laryngeal closure;Delayed swallow initiation;Premature  spillage to valleculae;Pharyngeal residue - valleculae Pharyngeal - Thin Pharyngeal - Thin Cup: Reduced tongue base retraction;Reduced  airway/laryngeal closure;Delayed swallow initiation;Pharyngeal  residue - valleculae;Premature spillage to pyriform  sinuses;Penetration/Aspiration before swallow;Moderate  aspiration;Compensatory strategies attempted (Comment) (right  head turn does not prevent aspiration) Penetration/Aspiration details (thin cup): Material enters  airway, passes BELOW cords without attempt by patient to eject  out (silent aspiration) (silent aspiration) Pharyngeal - Solids Pharyngeal - Puree: Delayed swallow initiation;Premature spillage  to valleculae;Pharyngeal residue - valleculae;Reduced tongue base  retraction Pharyngeal -  Mechanical Soft: Delayed swallow  initiation;Premature spillage to valleculae;Reduced tongue base  retraction;Pharyngeal residue - valleculae  Cervical Esophageal Phase    GO    Cervical Esophageal Phase Cervical Esophageal Phase: [facility]        Leah McCoy MA, CCC-SLP [phone]  McCoy Leah Meryl 12/01/2012, 2:48 PM     Medications: . albuterol  2.5 mg Nebulization TID  . antiseptic oral rinse  15 mL Mouth Rinse Q4H  . cholestyramine light  4 g Oral BID  . enoxaparin (LOVENOX) injection  40 mg Subcutaneous Q24H  . feeding supplement  1 Container Oral BID BM  . folic acid  1 mg Oral Daily  . furosemide  20 mg Intravenous Once  . [START ON 12/04/2012] furosemide  20 mg Oral Q breakfast  . insulin aspart  0-20 Units Subcutaneous TID AC & HS  . levothyroxine  25 mcg Oral Q24H  . lip balm  1 application Topical BID  . multivitamin with minerals  1 tablet Oral Daily  . pantoprazole  40 mg Oral Daily  . QUEtiapine  25 mg Oral QHS  . sodium chloride  10-40 mL Intracatheter Q12H  .  thiamine  100 mg Oral Daily    Assessment/Plan 1. Pneumoperitoneum with Perforation of jejunum of uncertain etiology with diffuse peritonitis, extensive intra-abdominal adhesions, coagulopathy, cirrhosis with ascites  1)S/p Exploratory laparotomy, debridement of abdominal wall tissue 2 cm x 8 cm, extensive lysis of adhesions requiring 45 minutes, repair small bowel perforation, enterorrhaphy x1, placement of negative pressure dressing for open abdomen. 11/12/2012, Ernestene Mention, MD.  2) Open Abdominal Wound, Jejunal Perforation,S/P Repair, Necrotic Fascia  With: Washout peritoneum, Debridement of Fascia,Abdominal wall Closure,11/14/2012, Ardeth Sportsman, MD.  Post op respiratory failure/ final extubation 5/18  Left lung collapse with reintubation  Post op hypotension/Sepsis/peritonitis/shock  Post op coagulopathy INR up to 3.23 on 11/12/12. Not Coumdain  Acute renal failure post op  PCM started on TNA  Acute  encephalopathy from ETOH use  Dysphonia/dysphagia  C diff negative on 5/23 Pre albumin was 4.4 on 11/22/12--Malnutrition, note from nutrition suggest PO intake and supplements on DII diet.prealbumin 12/01/12 = 2.2.  Ongoing Anemia with post op bleeding and multiple transfusions  2.Recurrent GI bleed, of uncertain etiology, foreign body found on w/u 10/07/12.  3. Prior Hernia repair with: Exploratory laparotomy, small bowel resection with primary anastomosis and primary repair of umbilical hernia. 12/09/2006, Leonie Man, M.D.  a) exploratory laparotomy with lysis of adhesions x1 hour  b) primary ventral hernia repair with suture  Surgeon: Dr. Harden Mo, 01/22/2012.  4. Recurrent GI bleed with prior enteroscopy with possible surgical clip found; full workup at [facility] pending.  5. Alcohol use with possible cirrhosis and withdrawal symptoms.  6.Brain aneurysm with coiling, complicated by CVA 2006.  7. Arthritis  8.Hypertension  9.Body mass index is 35.9   Plan:  Follow up in the office, wet to dry dressings BID.  I have told the pt and her husband to call if they have a concern with the wound.   LOS: 22 days    [name] [name] 12/03/2012

## 2012-12-03 NOTE — Progress Notes (Signed)
Calorie Count Note  48 hour calorie count ordered and now complete.  Diet: Dysphagia 2 with nectar thick liquids Supplements: Medical sales representative breakfast, Magic Cup, [employer]  Breakfast: none Lunch: 238 kcal, 13 grams protein Dinner: 278 kcal, 10 grams protein Supplements: 500 kcal, 18 grams protein  Total intake: 1016 kcal (70% of minimum estimated needs)  41 grams of protein (48% of minimum estimated needs)  Nutrition Dx: Inadequate oral intake related to poor appetite as evidenced by <50% meal intake.    Goal: Pt to meet >/= 90% of their estimated nutrition needs; not met  Intervention: Continue Resource Breeze BID Continue Multivitamin with minerals daily  Ian Malkin RD, LDN Inpatient Clinical Dietitian Pager: [phone] After Hours Pager: [phone]

## 2012-12-03 NOTE — Progress Notes (Signed)
Spoke with pt about OT for today. Pt waiting to go to SNF today and does not want to be fatigued before going and asked to skip therapy for today. Tory Emerald, OTR/L [phone]

## 2012-12-03 NOTE — Discharge Summary (Signed)
Physician Discharge Summary  [name] [name]:[id number] DOB: January 01, 1946 DOA: 11/11/2012  PCP: Judie Petit, MD  Admit date: 11/11/2012 Discharge date: 12/03/2012  Time spent: 35 minutes  Recommendations for Outpatient Follow-up:  1. DYS 3 with nectar thick 2. Wet to dry dressing 3. 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 female  With past medical history of chronic abdominal pain with recurrent nausea and vomiting as well as alcohol abuse. The patient has been admitted multiple times for nausea and vomiting and recurrent bleeds. She has a scheduled appointment at [facility] today however on which and having significant nausea and vomiting unretractable she came into the emergency room here for further evaluation. In the emergency room she was noted to have a normal white count and normal lipase level, but minimal elevation creatinine secondary dehydration as well as elevation in AST, alkaline phosphatase and a bilirubin of 4.6, one week ago was down to 1.5. She was also to be tachycardic with a heart rate in the 130s. Even  after IV fluids, she remained that way. She's given medicine for pain and nausea and is slightly drowsy but feeling a little bit better. Her vomiting has since stopped.   Hospital Course:  Respiratory failure-postoperative  - Status post extubation on 5/18, clinically stable and maintaining oxygen saturations at target range  - Continue nebulizer treatments TID  -incentive spirometry - ENT consulted for further management of dysphonia and dysphagia - status post transnasal fiberoptic laryngoscopy 11/30/2012 (Dr. 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, high risk aspiration and will need recommendation from speech pathology in terms of proper diet - DYS 3 with nectar- will need SLP follow up at [facility] - ENT recommended outpatient follow up   Sepsis/septic shock secondary to peritonitis  - Off vasopressors,  -resume lasix- watch BP closely - last ECHO in 2007 showed EF of 55-65% with some mitral valve calcification   Acute kidney failure  - likely pre renal in etiology  - now resolved  Anemia of chronic disease  - stable over 24 hours -CBC 1 week   Severe protein calorie malnutrition  - pt is at high risk for aspiration, continue dysphagia III diet as recommended after SLP and MBS evaluation   Alcoholic cirrhosis and Acute alcoholic hepatitis.  - pt denies drinking, cessation discussed   Hypokalemia  - supplemented  Transaminitis  - alcoholic hepatitis, improving   Perforated viscus in jejunem s/p repair 5/8.  - s/p  washout abdominal wound, debridement of fascia, partial closure  - appreciate surgery input   Hypothyroidism  - continue Synthroid   Acute encephalopathy  - resolved   - continue thiamine, folate secondary to alcohol abuse history   SIGNIFICANT EVENTS / STUDIES:  5/7 Admitted with abdominal pain, nausea, vomiting  5/7 OR >>> Perforated viscus, peritonitis, lysis of adhesions, small bowel repair  5/16  Reintubated r/t complete left sided collapse, increased WBC  5/18 Extubated, WBC decreased, TF started  5/20 Speech assessment >> D1 diet  5/22 D/c TPN, transfer to non tele floor bed  5/26 - ENT consult --> status post transnasal fiberoptic laryngoscopy (Dr. 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 11/12/2012, Ernestene Mention, MD.  2) Open Abdominal Wound, Jejunal Perforation,S/P Repair, Necrotic Fascia  With: Washout peritoneum, Debridement of Fascia,Abdominal wall Closure,11/14/2012, Ardeth Sportsman, MD.    Consultations:  surgery  Discharge Exam: Filed Vitals:   12/02/12 2042 12/02/12 2049 12/03/12 0500 12/03/12 0739  BP: 107/58  105/50   Pulse: 97  94   Temp: 98 F (36.7 C)  97.7 F (36.5 C)   TempSrc: Oral  Oral   Resp: 20  20   Height:      Weight:      SpO2: 97% 93% 97% 96%    General: A+Ox3, ill appearing Cardiovascular: rrr Respiratory: clear  Discharge Instructions  Discharge Orders   Future Orders Complete By Expires     Diet - low sodium heart healthy  As directed     Discharge instructions  As directed     Comments:      DYS 3 nectar D/C picc and d/c foley abd binder Wet to dry dressing BID CBC/BMP in 1 week Carnation instant breakfast BID    Increase activity slowly  As directed         Medication List    STOP taking these medications       ALPRAZolam 0.25 MG tablet  Commonly known as:  XANAX     benazepril 20 MG tablet  Commonly known as:  LOTENSIN     labetalol 100 MG tablet  Commonly known as:  NORMODYNE     simvastatin 40 MG tablet   Commonly known as:  ZOCOR      TAKE these medications       albuterol (5 MG/ML) 0.5% nebulizer solution  Commonly known as:  PROVENTIL  Take 0.5 mLs (2.5 mg total) by nebulization 3 (three) times daily.     antiseptic oral rinse Liqd  15 mLs by Mouth Rinse route every 4 (four) hours.     bisacodyl 10 MG suppository  Commonly known as:  DULCOLAX  Place 1 suppository (10 mg total) rectally every 12 (twelve) hours as needed.     cholestyramine light 4 G packet  Commonly known as:  PREVALITE  Take 1 packet (4 g total) by mouth 2 (two) times daily.     feeding supplement Liqd  Take 1 Container by mouth 2 (two) times daily between meals.     folic acid 1 MG tablet  Commonly known as:  FOLVITE  Take 1 tablet (1  mg total) by mouth daily.     furosemide 20 MG tablet  Commonly known as:  LASIX  Take 1 tablet (20 mg total) by mouth daily with breakfast.  Start taking on:  12/04/2012     insulin aspart 100 UNIT/ML injection  Commonly known as:  novoLOG  Inject 0-20 Units into the skin 4 (four) times daily -  before meals and at bedtime.     levothyroxine 25 MCG tablet  Commonly known as:  SYNTHROID, LEVOTHROID  Take 1 tablet (25 mcg total) by mouth daily.     lip balm ointment  Apply 1 application topically 2 (two) times daily.     multivitamin with minerals Tabs  Take 1 tablet by mouth daily.     ondansetron 4 MG tablet  Commonly known as:  ZOFRAN  Take 4 mg by mouth every 8 (eight) hours as needed for nausea.     pantoprazole 40 MG tablet  Commonly known as:  PROTONIX  Take 1 tablet (40 mg total) by mouth daily.     QUEtiapine 25 MG tablet  Commonly known as:  SEROQUEL  Take 1 tablet (25 mg total) by mouth at bedtime.     RESOURCE THICKENUP CLEAR Powd  As needed     thiamine 100 MG tablet  Take 1 tablet (100 mg total) by mouth daily.       Allergies  Allergen Reactions  . Etomidate     Myoclonus side effect pronounced  . Cephalexin Rash  . Penicillins  Rash       Follow-up Information   Follow up with Ernestene Mention, MD. Schedule an appointment as soon as possible for a visit in 2 weeks.   Contact information:   [address] [place] [address] [phone]        The results of significant diagnostics from this hospitalization (including imaging, microbiology, ancillary and laboratory) are listed below for reference.    Significant Diagnostic Studies: Abd 1 View (kub)  11/11/2012   *RADIOLOGY REPORT*  Clinical Data: Abdominal pain with nausea and vomiting.  Reduced bowel sounds.  ABDOMEN - 1 VIEW  Comparison: 10/05/2012  Findings: There is a ovoid shaped gas structure in the midline which likely represents stomach.  Inferior and lateral to this there are moderately distended bowel loops which could represent small bowel or colon.  A similar appearance was noted on the prior exam from 10/05/2012, although the bowel loops are more distended at that time. A linear radiopaque density which was previously located in the right upper quadrant  now projects over the left lower quadrant. A more subtle left lower quadrant radiopaque density located along the left panniculus remains unchanged.  IMPRESSION: Gas-filled mid abdominal loops are not as distended as previously noted, but mildly abnormal. Findings consistent with ileus although early obstruction not excluded.  Previously noted right upper quadrant radiopaque density has transited to the left side of the abdomen.   Original Report Authenticated By: Davonna Belling, M.D.   Ct Soft Tissue Neck W Contrast  11/28/2012   *RADIOLOGY REPORT*  Clinical Data: Previous intubation.  Hoarseness.  Abnormal phonation.  CT NECK WITH CONTRAST  Technique:  Multidetector CT imaging of the neck was performed with intravenous contrast.  Contrast: OMNIPAQUE IOHEXOL 300 MG/ML  SOLN  Comparison: None.  Findings: Limited visualization of the intracranial contents does not show any acute finding.   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  Lung apices show an effusion on  the left with dependent atelectasis.  Both parotid glands are normal.  Both submandibular glands are normal.  The thyroid gland is normal.  There are no enlarged lymph nodes on either side of the neck.  Arterial and venous structures are patent.  There is atherosclerotic disease of both carotid bifurcations.  No mucosal or submucosal mass lesion is seen.  The region of the glottis appears normal.  There is the suggestive appearance of an abnormal outpouching of the trachea at the level of the thoracic inlet extending anteriorly. However, I believe this is due to patient motion as there is also a duplication of some other anatomical findings such as the lung apices.  This would be a distinctly unusual tracheal abnormality.  IMPRESSION: I do not believe the CT scan shows an explanation for the patient's abnormal phonation.  There is the appearance of an outpouching in the trachea at the thoracic inlet, but I believe this is artifactual and related to patient motion during the scan.  See above discussion.   Original Report Authenticated By: Paulina Fusi, M.D.   Ct Abdomen Pelvis W Contrast  11/19/2012   *RADIOLOGY REPORT*  Clinical Data: Abdominal pain, leukocytosis, recent surgery for perforated small bowel  CT ABDOMEN AND PELVIS WITH CONTRAST  Technique:  Multidetector CT imaging of the abdomen and pelvis was performed following the standard protocol during bolus administration of intravenous contrast.  Contrast: OMNIPAQUE IOHEXOL 300 MG/ML  SOLN  Comparison: 09/18/2012  Findings: Motion degraded images.  Mild patchy opacity in the right lower lobe (series 5/image 7), suspicious for pneumonia.  Additional patchy opacity in the left lower lobe (series 5/image 8), suspicious for pneumonia, less likely compressive atelectasis.  Right basilar opacity, likely atelectasis.  Small left and trace right pleural effusions.  Cirrhotic  configuration of the liver.  1.9 x 1.5 cm hypoenhancing lesion in the lateral segment left hepatic lobe, previously favored to reflect a benign hemangioma although indeterminate, unchanged from 2013.  Spleen, pancreas, and adrenal glands are within normal limits.  Small layering gallstones (series 2/image 38).  No associated inflammatory changes by CT.  Kidneys are unremarkable.  No renal calculi or hydronephrosis.  No evidence of bowel obstruction.  No contrast extravasation to suggest leak/perforation following surgery.  Colonic diverticulosis, without associate inflammatory changes.  No free air.  Atherosclerotic calcifications of the abdominal aorta and branch vessels. Shunt/collateral vessel in the left mid abdomen (series 2/image 60), between the portosplenic confluence and the IVC. Portal vein is patent.  Moderate abdominopelvic ascites.  Uterus is mildly heterogeneous, possibly reflecting uterine fibroids.  No adnexal masses.  Bladder decompressed by indwelling Foley catheter.  Postsurgical changes in the midline anterior abdominal wall.  Body wall edema.  Degenerative changes of the visualized thoracolumbar spine.  IMPRESSION: No oral contrast extravasation to suggest leak/perforation.  No free air.  Cirrhosis.  Venous collaterals between the portable at confluence in the IVC.  Portal vein remains patent.  1.9 x 1.5 cm hypoenhancing lesion in the lateral segment left hepatic lobe, indeterminate, unchanged since 2013.  Correlate with serum AFP and consider follow-up MRI abdomen with/without contrast for further characterization as an outpatient.  Moderate abdominopelvic ascites.  Body wall edema.  Suspected right middle lobe and possible left lower lobe pneumonia. Small left and trace right pleural effusions.  Additional ancillary findings as above.   Original Report Authenticated By: Charline Bills, M.D.   Dg Chest Port 1 View  11/27/2012   *RADIOLOGY REPORT*  Clinical Data: Pneumonia.  Shortness  of  breath for 3 weeks. History of hypertension  PORTABLE CHEST - 1 VIEW  Comparison: 11/25/2012  Findings: A right subclavian CVP is stable in position.  Heart and mediastinal contours are unchanged with mitral annulus calcification and aortic calcification again noted.  The lung fields are notable for persistent but improved pulmonary vascular congestion.  No overt congestive failure is suggested. Focal density at the left base may be the result of atelectasis or focal residual basilar alveolar edema but an area of focal infiltrate is not excluded.  Small bilateral pleural effusions are again noted.  Focal subsegmental atelectasis is seen in the left lung zone.  IMPRESSION: Interval improvement in pulmonary vascular congestion and edema pattern.  Left lower lobe density, question atelectasis, residual focal alveolar edema versus infiltrate.   Original Report Authenticated By: Rhodia Albright, M.D.   Dg Chest Port 1 View  11/25/2012   *RADIOLOGY REPORT*  Clinical Data: Shortness of breath.  PORTABLE CHEST - 1 VIEW  Comparison: 11/24/2012.  Findings: The right PICC line is stable.  The NG tube has been removed.  Persistent low lung volumes with vascular crowding, atelectasis, vascular congestion and small effusions.  IMPRESSION:  1.  Removal of NG tube. 2.  Persistent vascular congestion, edema, atelectasis and effusions.   Original Report Authenticated By: Rudie Meyer, M.D.   Dg Chest Port 1 View  11/24/2012   *RADIOLOGY REPORT*  Clinical Data: Follow up atelectasis.  PORTABLE CHEST - 1 VIEW  Comparison: 11/23/2012.  Findings: The NG tube is stable.  The left IJ catheter has been removed. A new right PICC line is in good position with the tip near the cavoatrial junction.  The heart lungs appears stable with persistent low lung volumes, vascular crowding, atelectasis and effusions.  IMPRESSION:  1.  Removal of left IJ catheter. 2.  New right-sided PICC line with tip near the cavoatrial junction. 3.  Persistent  low lung volumes with vascular crowding, atelectasis, vascular congestion and probable small effusions.   Original Report Authenticated By: Rudie Meyer, M.D.   Dg Chest Port 1 View  11/23/2012   *RADIOLOGY REPORT*  Clinical Data: Status post extubation  PORTABLE CHEST - 1 VIEW  Comparison: 11/22/2012  Findings: The cardiac shadow is stable.  The endotracheal tube has been removed.  The nasogastric catheter and left-sided central venous line remain in place. The lungs are well-aerated again demonstrates some mild bibasilar changes.  IMPRESSION: Interval extubation.  The remainder of the exam is stable from prior study.   Original Report Authenticated By: Alcide Clever, M.D.   Dg Chest Port 1 View  11/22/2012   *RADIOLOGY REPORT*  Clinical Data: Evaluate collapse.  History stroke.  Hypertension.  PORTABLE CHEST - 1 VIEW  Comparison: 11/21/2012  Findings: Endotracheal tube appropriately position with tip 3 cm above carina.  Nasogastric tube looped in stomach with tip below the inferior aspect of film.  Left IJ central line terminates at the cavoatrial junction.  Cardiomegaly accentuated by AP portable technique.  Atherosclerosis in the transverse aorta.  Persistent small left pleural effusion. No pneumothorax.  Low lung volumes and mild interstitial edema, similar.  No change in left greater than right bibasilar airspace disease.  IMPRESSION: No significant change since one day prior.  Bibasilar atelectasis versus infection, greater left than right.  Low lung volumes with mild interstitial edema and persistent small left pleural effusion.  Age advanced aortic atherosclerosis.   Original Report Authenticated By: Jeronimo Greaves, M.D.   Portable Chest Xray In Am  11/21/2012   *RADIOLOGY REPORT*  Clinical Data: New.  PORTABLE CHEST - 1 VIEW  Comparison: 5/16, 5/15, and 11/18/2012  Findings: There is been partial clearing of the infiltrates at both bases.  Heart size and vascularity are normal.  Endotracheal tube and  central venous catheter and NG tube are unchanged.  IMPRESSION: Improvement in the bibasilar infiltrates.   Original Report Authenticated By: Francene Boyers, M.D.   Portable Chest Xray  11/20/2012   *RADIOLOGY REPORT*  Clinical Data: Endotracheal tube placement.  PORTABLE CHEST - 1 VIEW  Comparison: 11/20/2012 5:00 a.m. and 11/19/2012.  Findings: Endotracheal tube has been placed with the tip 3.5 cm above the carina.  Improved aeration of the left lung with residual left base atelectasis and / or left-sided pleural effusion. Decrease in degree of midline shift to the left.  Left base infiltrate would be difficult to exclude with the presence of atelectasis.  Left central line tip distal superior vena cava level.  No gross pneumothorax.  Pulmonary vascular congestion/pulmonary edema most notable centrally.  Nasogastric tube courses below the diaphragm.  The tip is not included on this exam.  Heart size difficult to adequately assessed.  Mitral valve calcifications.  Calcification of the aorta.  IMPRESSION: Endotracheal tube has been placed with the tip 3.5 cm above the carina.  Improved aeration of the left lung with residual left base atelectasis and / or left-sided pleural effusion. Decrease in degree of midline shift to the left.  Pulmonary vascular congestion/ pulmonary edema most notable centrally.  This is a call report.   Original Report Authenticated By: Lacy Duverney, M.D.   Dg Chest Port 1 View  11/20/2012   *RADIOLOGY REPORT*  Clinical Data: Evaluate endotracheal tube placement.  PORTABLE CHEST - 1 VIEW  Comparison: 11/19/2012  Findings: Endotracheal tube has been removed.  There is now complete opacification of the left hemithorax.  Findings are most compatible with volume loss and mucous plugging.  Mediastinal shift towards the left.  Nasogastric tube extends into the abdomen.  Left jugular central line is in the SVC region.  Streaky densities at the right lung base are most compatible atelectasis.   IMPRESSION: Complete opacification of the left hemithorax is most compatible with volume loss and mucous plugging.  These results were called by telephone on 11/20/2012 at 01/1943 and to the patient's nurse, Kennyth Arnold, who verbally acknowledged these results.   Original Report Authenticated By: Richarda Overlie, M.D.   Dg Chest Port 1 View  11/19/2012   *RADIOLOGY REPORT*  Clinical Data: Pulmonary vascular congestion.  Endotracheal tube placement.  PORTABLE CHEST - 1 VIEW  Comparison: 11/18/2012  Findings: Endotracheal tube, central venous catheter, and NG tube all appear in good position.  Pulmonary vascular congestion persists.  Increasing density at the left base is probably an effusion.  Slight atelectasis at the right base is unchanged.  IMPRESSION: Increasing left pleural effusion.  Persistent pulmonary vascular congestion and slight right base atelectasis.   Original Report Authenticated By: Francene Boyers, M.D.   Dg Chest Port 1 View  11/18/2012   *RADIOLOGY REPORT*  Clinical Data: Endotracheal tube placement  PORTABLE CHEST - 1 VIEW  Comparison: 11/17/2012  Findings: 0453 hours.  Endotracheal tube tip is 3.2 cm above the base of the carina.  The left IJ central venous catheter tip overlies the mid to lower SVC.  Lung volumes are low. The cardiopericardial silhouette is enlarged.  Vascular congestion noted without overt airspace pulmonary edema.  There is slight improvement in basilar lung  aeration. The NG tube passes into the stomach although the distal tip position is not included on the film. Telemetry leads overlie the chest.  IMPRESSION: Low volume film with cardiomegaly vascular congestion.  Slight improvement of bibasilar collapse / consolidation.   Original Report Authenticated By: Kennith Center, M.D.   Dg Chest Port 1 View  11/17/2012   *RADIOLOGY REPORT*  Clinical Data: Status post repair of small bowel perforation. Ventilated patient.  PORTABLE CHEST - 1 VIEW  Comparison: Chest 11/16/2012.  Findings:  Support tubes and lines are unchanged.  Bibasilar airspace opacities persist and appear worsened on the right.  No pneumothorax identified.  Heart size is normal.  IMPRESSION: Some worsening of airspace disease in the right lung base.  No change on the left.   Original Report Authenticated By: Holley Dexter, M.D.   Dg Chest Port 1 View  11/16/2012   *RADIOLOGY REPORT*  Clinical Data: Endotracheal placement  PORTABLE CHEST - 1 VIEW  Comparison: 11/15/2012  Findings: Endotracheal tube has its tip 2.5 cm above the carina. Nasogastric tube enters the stomach.  Left internal jugular central line has its tip at the SVC/RA junction.  Heart size is stable. Patchy density in both lung bases persist consistent atelectasis and/or pneumonia.  No new finding.  IMPRESSION: Lines and tubes grossly well positioned.  Lower lobe atelectasis and/or pneumonia persists bilaterally.   Original Report Authenticated By: Paulina Fusi, M.D.   Dg Chest Port 1 View  11/15/2012   *RADIOLOGY REPORT*  Clinical Data: Intubated patient.  PORTABLE CHEST - 1 VIEW  Comparison: The tip in the portable chest 11/14/2012.  Findings: Support tubes and lines are unchanged.  There has been some increase in right basilar atelectasis.  Small left effusion and basilar airspace disease are unchanged.  No pneumothorax.  IMPRESSION:  1.  Support apparatus unchanged and in good position. 2.  Increased right basilar atelectasis. 3.  No marked change in a small left effusion and basilar airspace disease.   Original Report Authenticated By: Holley Dexter, M.D.   Dg Chest Port 1 View  11/14/2012   *RADIOLOGY REPORT*  Clinical Data: Intubated patient.  PORTABLE CHEST - 1 VIEW  Comparison: Chest 11/12/2012.  Findings: Support tubes and lines are unchanged.  Left basilar airspace disease persists.  Right lung appears clear.  No pneumothorax.  Heart size upper normal.  IMPRESSION: No change in left basilar airspace disease.  No new abnormality.   Original  Report Authenticated By: Holley Dexter, M.D.   Dg Chest Port 1 View  11/12/2012   *RADIOLOGY REPORT*  Clinical Data: Evaluate central line placement.  PORTABLE CHEST - 1 VIEW  Comparison: 11/12/2012  Findings: Endotracheal tube is 3.8 cm above the carina.  Left jugular central line tip is in the lower SVC region.  No evidence for a pneumothorax.  Persistent left basilar densities could represent atelectasis and cannot exclude pleural fluid.  The nasogastric tube extends into the abdomen.  Stable appearance of the heart.  IMPRESSION: Central line tip in the lower SVC region.  Negative for a pneumothorax.  Stable basilar densities.   Original Report Authenticated By: Richarda Overlie, M.D.   Dg Chest Port 1 View  11/12/2012   *RADIOLOGY REPORT*  Clinical Data: Perforated jejunum.  Peritonitis.  PORTABLE CHEST - 1 VIEW 5:34 p.m.  Comparison: 11/12/2012 at 11:35 a.m.  Findings: Endotracheal tube is in good position 3 cm above the carina.  NG tube tip is in the stomach.  Heart size and vascularity are normal.  There is slight atelectasis in the left upper lobe medially.  Scarring at the left base.  Dense calcification in the mitral valve annulus.  IMPRESSION: New atelectasis in the left upper lobe medially.   Original Report Authenticated By: Francene Boyers, M.D.   Dg Chest Port 1 View  11/12/2012   *RADIOLOGY REPORT*  Clinical Data: Cough and shortness of breath.  Fever.  Nausea and vomiting.  PORTABLE CHEST - 1 VIEW  Comparison: Chest x-rays dated 10/05/2012 and 01/20/2012  Findings: There is suggestion of free air under the right hemidiaphragm.  I recommend a left lateral decubitus view of the abdomen for further evaluation of this possibility.  There is chronic cardiomegaly with scarring at the left lung base laterally.  Dense calcification in the mitral valve annulus.  There is slight pulmonary vascular congestion.  No infiltrates.  IMPRESSION:  1.  Possible free air under the right hemidiaphragm.  Left lateral  decubitus view of the abdomen recommended for further evaluation. 2.  Pulmonary vascular congestion. 3.  No acute infiltrates.  Critical Value/emergent results were called by telephone at the time of interpretation on 11/12/2012 at 11:48 a.m. to Mercy Moore, RN, who verbally acknowledged these results.   Original Report Authenticated By: Francene Boyers, M.D.   Dg Abd Decub  11/12/2012   *RADIOLOGY REPORT*  Clinical Data: Abdominal pain and nausea.  Question free intraperitoneal air.  ABDOMEN - 1 VIEW DECUBITUS  Comparison: Plain films abdomen 11/11/2012.  Findings: There is free intraperitoneal air.  IMPRESSION: Study is positive for free intraperitoneal air consistent with bowel perforation.  Critical Value/emergent results were called by telephone at the time of interpretation on 11/12/2012 at 12:53 p.m. to the patient's nurse, Bernette Redbird, who verbally acknowledged these results.   Original Report Authenticated By: Holley Dexter, M.D.   Dg Swallowing [facility]  12/01/2012   Vivi Ferns [place], CCC-SLP     12/01/2012  2:48 PM Objective Swallowing Evaluation: Modified Barium Swallowing Study   Patient Details  Name: GRETHEL ZENK MRN: [id number] Date of Birth: January 15, 1946  Today's Date: 12/01/2012 Time: 1400-1420 SLP Time Calculation (min): 20 min  Past Medical History:  Past Medical History  Diagnosis Date  . Hypertension   . H/O brain surgery   . Aneurysm of anterior cerebral artery   . Hernia   . Anxiety   . Stroke   . Anemia   . Arthritis    Past Surgical History:  Past Surgical History  Procedure Laterality Date  . Laparotomy  01/22/2012    Procedure: EXPLORATORY LAPAROTOMY;  Surgeon: Emelia Loron,  MD;  Location[facility] OR;  Service: General;  Laterality: N/A;  lysis  of adhesions  . Hernia repair    . Esophagogastroduodenoscopy N/A 09/19/2012    Procedure: ESOPHAGOGASTRODUODENOSCOPY (EGD);  Surgeon: Charna Debi, MD;  Location: [facility] ENDOSCOPY;  Service: Endoscopy;   Laterality: N/A;  . Colonoscopy  N/A 09/20/2012    Procedure: COLONOSCOPY;  Surgeon: Charna Valera, MD;  Location:  [facility] ENDOSCOPY;  Service: Endoscopy;  Laterality: N/A;  . Givens capsule study N/A 09/21/2012    Procedure: GIVENS CAPSULE STUDY;  Surgeon: Charna Ileene, MD;   Location: [facility] ENDOSCOPY;  Service: Endoscopy;  Laterality: N/A;  . Enteroscopy N/A 09/28/2012    Procedure: ENTEROSCOPY;  Surgeon: Rachael Fee, MD;   Location: [facility] ENDOSCOPY;  Service: Endoscopy;  Laterality: N/A;  . Upper gastrointestinal endoscopy  10/05/12  . Enteroscopy N/A 10/05/2012    Procedure: ENTEROSCOPY;  Surgeon: Meryl Dare, MD;  Location: [facility] ENDOSCOPY;  Service: Endoscopy;  Laterality: N/A;  . Laparotomy N/A 11/12/2012    Procedure: EXPLORATORY LAPAROTOMY  with repair perforated small  bowel, abdominal wall debridement, application of wound vac  dressing;  Surgeon: Ernestene Mention, MD;  Location: [facility] ORS;   Service: General;  Laterality: N/A;  . Wound debridement  11/14/2012    Procedure: Washout Abdominal Wound, debridement of Fascia,  Partial Closure;  Surgeon: Ardeth Sportsman, MD;  Location: [facility]  ORS;  Service: General;;   HPI:  67 yo female adm to [facility] with perforation of jejunum s/p repair  11/12/12.  Pt PMH for alcohol hepatitis, ileus, ARF, alcohol abuse  - ETOH w/d, anemia, CVA 08, dyspnea, cough-wheezing, ARF, asp  pna, divericulosis, brain surgery, and HTN.  Pt is s/p bronch  5/16, required intubation 5/8-5/15, 5/16-5/18.  Order received  for swallow evaluation which was completed on 5/19 with rec for  ice only (pt had NG in place and was easily dyspneic). Diet has  since been advanced to dysphagia 2 with nectar thick liquid by  SLP team. ENT consult also complete which indicated immobile  right vocal cord. Objective evaluation ordered to determine least  restrictive diet in light of continued subtle s/s of aspiration  at bedside along with suspected vagus nerve involvement.       Assessment / Plan / Recommendation Clinical Impression  Dysphagia Diagnosis:  Moderate pharyngeal phase dysphagia Clinical impression: Patient presents with a moderate primarily  pharyngeal phase dysphagia characterized by decreased laryngeal  closure, when combined with delayed swallow initiation and  significant impulsivity with self-feeding/large sips which could  not be decreased despite max SLP cueing, results in silent  aspiration of thin liquids along posterior tracheal wall. Cued  cough unsuccessful to fully clear as patient unable to fully  adduct vocal cords and cues for various head positions  unsuccessful to prevent decreased airway protection. Patient was  able to protect airway with solids and nectar thick liquids,  requiring moderate cueing for consistent dry swallows to clear  primarily vallecular residue. Recommend dysphagia 3 solids,  nectar thick liquids at this time with strict use of compensatory  strategies and aspiration precautions, particularly small  bite/sips and slow rate of intake. Hopeful that with time off  ventilator, some recovery of right vocal cord function, and  cognitive status allowing for better use of precautions, patient  will be able to advance diet. SLP will f/u. Recommend SNF SLP f/u  after d/c.     Treatment Recommendation  Therapy as outlined in treatment plan below    Diet Recommendation Dysphagia 3 (Mechanical Soft);Nectar-thick  liquid   Liquid Administration via: Cup;No straw Medication Administration: Whole meds with puree Supervision: Patient able to self feed;Full supervision/cueing  for compensatory strategies Compensations: Slow rate;Small sips/bites;Clear throat  intermittently Postural Changes and/or Swallow Maneuvers: Seated upright 90  degrees;Upright 30-60 min after meal    Other  Recommendations Oral Care Recommendations: Oral care BID Other Recommendations: Order thickener from pharmacy;Prohibited  food (jello, ice cream, thin soups);Remove water pitcher   Follow Up Recommendations  Skilled Nursing facility    Frequency and  Duration min 2x/week  2 weeks   Pertinent Vitals/Pain None reported    SLP Swallow Goals Patient will utilize recommended strategies during swallow to  increase swallowing safety with: Maximum assistance Swallow Study Goal #2 - Progress: Progressing toward goal Goal #3: Pt will demonstration ability to phonate 4 words per  breath group with adequate control to demonstrate improved  airway  protection.   Swallow Study Goal #3 - Progress: Progressing toward goal   General Date of Onset: 11/16/12 HPI: 67 yo female adm to [facility] with perforation of jejunum s/p  repair 11/12/12.  Pt PMH for alcohol hepatitis, ileus, ARF, alcohol  abuse - ETOH w/d, anemia, CVA 08, dyspnea, cough-wheezing, ARF,  asp pna, divericulosis, brain surgery, and HTN.  Pt is s/p bronch  5/16, required intubation 5/8-5/15, 5/16-5/18.  Order received  for swallow evaluation which was completed on 5/19 with rec for  ice only (pt had NG in place and was easily dyspneic). Diet has  since been advanced to dysphagia 2 with nectar thick liquid by  SLP team. ENT consult also complete which indicated immobile  right vocal cord. Objective evaluation ordered to determine least  restrictive diet in light of continued subtle s/s of aspiration  at bedside along with suspected vagus nerve involvement.   Type of Study: Modified Barium Swallowing Study Reason for Referral: Objectively evaluate swallowing function Previous Swallow Assessment: see HPI Diet Prior to this Study: Dysphagia 2 (chopped);Nectar-thick  liquids Temperature Spikes Noted: No Respiratory Status: Supplemental O2 delivered via (comment)  (nasal cannula) History of Recent Intubation: Yes Length of Intubations (days):  (see HPI) Date extubated: 11/22/12 Behavior/Cognition: Pleasant mood;Cooperative;Alert;Confused Oral Cavity - Dentition: Adequate natural dentition Oral Motor / Sensory Function:  (see bedside swallow evaluation) Self-Feeding Abilities: Able to feed self Patient Positioning: Upright in  chair Baseline Vocal Quality: Aphonic (minimal audible phonation noted) Volitional Cough: Weak Volitional Swallow: Able to elicit Anatomy: Within functional limits Pharyngeal Secretions: Not observed secondary MBS    Reason for Referral Objectively evaluate swallowing function   Oral Phase Oral Preparation/Oral Phase Oral Phase: WFL   Pharyngeal Phase Pharyngeal Phase Pharyngeal Phase: Impaired Pharyngeal - Nectar Pharyngeal - Nectar Teaspoon: Reduced tongue base  retraction;Reduced airway/laryngeal closure;Delayed swallow  initiation;Premature spillage to valleculae;Pharyngeal residue -  valleculae Pharyngeal - Nectar Cup: Reduced tongue base retraction;Reduced  airway/laryngeal closure;Delayed swallow initiation;Premature  spillage to valleculae;Pharyngeal residue - valleculae Pharyngeal - Thin Pharyngeal - Thin Cup: Reduced tongue base retraction;Reduced  airway/laryngeal closure;Delayed swallow initiation;Pharyngeal  residue - valleculae;Premature spillage to pyriform  sinuses;Penetration/Aspiration before swallow;Moderate  aspiration;Compensatory strategies attempted (Comment) (right  head turn does not prevent aspiration) Penetration/Aspiration details (thin cup): Material enters  airway, passes BELOW cords without attempt by patient to eject  out (silent aspiration) (silent aspiration) Pharyngeal - Solids Pharyngeal - Puree: Delayed swallow initiation;Premature spillage  to valleculae;Pharyngeal residue - valleculae;Reduced tongue base  retraction Pharyngeal - Mechanical Soft: Delayed swallow  initiation;Premature spillage to valleculae;Reduced tongue base  retraction;Pharyngeal residue - valleculae  Cervical Esophageal Phase    GO    Cervical Esophageal Phase Cervical Esophageal Phase: [facility]        Ferdinand Lango MA, CCC-SLP [phone]  McCoy Leah Meryl 12/01/2012, 2:48 PM     Microbiology: Recent Results (from the past 240 hour(s))  CLOSTRIDIUM DIFFICILE BY PCR     Status: None   Collection Time     11/27/12  3:20 AM      Result Value Range Status   C difficile by pcr NEGATIVE  NEGATIVE Final     Labs: Basic Metabolic Panel:  Recent Labs Lab 11/29/12 0500 11/30/12 0500 12/01/12 0500 12/02/12 0425 12/03/12 0410  NA 139 139 135 137 136  K 3.1* 3.4* 3.7 3.7 3.5  CL 107 107 106 107 107  CO2 25 24 22 23 22   GLUCOSE 94 103* 76 121* 98  BUN 19 20  18 15 13   CREATININE 0.92 1.05 1.05 0.93 0.84  CALCIUM 7.3* 7.4* 7.4* 7.4* 7.5*   Liver Function Tests:  Recent Labs Lab 11/29/12 0500  AST 76*  ALT 28  ALKPHOS 154*  BILITOT 4.3*  PROT 6.6  ALBUMIN 1.1*   No results found for this basename: LIPASE, AMYLASE,  in the last 168 hours No results found for this basename: AMMONIA,  in the last 168 hours CBC:  Recent Labs Lab 11/29/12 0500 11/30/12 0500 12/01/12 0500 12/02/12 0425 12/03/12 0410  WBC 8.5 7.7 7.9 9.4 12.1*  HGB 8.0* 7.8* 7.9* 7.8* 8.1*  HCT 25.7* 24.2* 25.0* 25.4* 26.2*  MCV 94.5 94.2 94.7 95.1 94.2  PLT 172 175 178 174 203   Cardiac Enzymes: No results found for this basename: CKTOTAL, CKMB, CKMBINDEX, TROPONINI,  in the last 168 hours BNP: BNP (last 3 results) No results found for this basename: PROBNP,  in the last 8760 hours CBG:  Recent Labs Lab 12/02/12 0731 12/02/12 1158 12/02/12 1632 12/02/12 2111 12/03/12 0730  GLUCAP 93 98 140* 129* 89       Signed:  VANN, JESSICA  [facility] 12/03/2012, 9:46 AM

## 2012-12-03 NOTE — Progress Notes (Signed)
General Surgery [facility]  Reviewed and agree.  To SNF today.  Follow up at [facility] office as scheduled.  Velora Heckler, MD, [facility] Office: [phone]

## 2012-12-07 ENCOUNTER — Telehealth (INDEPENDENT_AMBULATORY_CARE_PROVIDER_SITE_OTHER): Payer: Self-pay | Admitting: General Surgery

## 2012-12-07 NOTE — Telephone Encounter (Signed)
Vance Gather, nurse at [facility], called about the increased drainage from this pt's abdominal wound.  They are doing wet-to-dry dressings, per the discharge instructions.  While the color, consistency and odor have not changed, the amount has become copious, to "where it is just pouring out now."  If the pt is sitting up in a wheelchair or moves any at all, the drainage increases, soaking through the dressings again.  The nurse's plan is to monitor the drainage and dressing changes today and will call back to triage tomorrow to report results.

## 2012-12-08 ENCOUNTER — Encounter: Payer: Self-pay | Admitting: Internal Medicine

## 2012-12-08 ENCOUNTER — Non-Acute Institutional Stay (SKILLED_NURSING_FACILITY): Payer: BC Managed Care – PPO | Admitting: Internal Medicine

## 2012-12-08 DIAGNOSIS — R404 Transient alteration of awareness: Secondary | ICD-10-CM

## 2012-12-08 DIAGNOSIS — E119 Type 2 diabetes mellitus without complications: Secondary | ICD-10-CM

## 2012-12-08 DIAGNOSIS — R410 Disorientation, unspecified: Secondary | ICD-10-CM

## 2012-12-08 DIAGNOSIS — A419 Sepsis, unspecified organism: Secondary | ICD-10-CM

## 2012-12-08 DIAGNOSIS — R6520 Severe sepsis without septic shock: Secondary | ICD-10-CM

## 2012-12-08 DIAGNOSIS — I10 Essential (primary) hypertension: Secondary | ICD-10-CM

## 2012-12-08 DIAGNOSIS — R6521 Severe sepsis with septic shock: Secondary | ICD-10-CM

## 2012-12-08 DIAGNOSIS — R1084 Generalized abdominal pain: Secondary | ICD-10-CM

## 2012-12-08 DIAGNOSIS — K7460 Unspecified cirrhosis of liver: Secondary | ICD-10-CM

## 2012-12-08 NOTE — Progress Notes (Signed)
Patient ID: [name] [name], female   DOB: [birth year], 67 y.o.   MRN: [id number]   PCP: Judie Petit, MD  Code Status: Full code  Allergies  Allergen Reactions  . Erythromycin Swelling  . Etomidate     Myoclonus side effect pronounced  . Cephalexin Rash  . Penicillins Rash    Chief Complaint: new admission for STR s/p hospitalization for intractable n/v  HPI:   67 yo female with h/o alcohol abuse with cirrhosis, GI bleed, aspiration pneumonia was admitted here for short term rehab and wound care s/p hospitalization for intractable n/v.  She was found to have acute peritonitis with septic shock due to ruptured jejunum.  She underwent repair on 11/12/12 with wound left partially open, but developed postoperative respiratory failure.  When seen, she was overtly jaundice and appeared quite ill.  She was fatigued and had ongoing abdominal pain, but no other complaints.    Review of Systems:  Review of Systems  Constitutional: Positive for malaise/fatigue. Negative for fever and chills.  HENT: Negative for congestion.   Respiratory: Negative for shortness of breath.   Cardiovascular: Negative for chest pain.  Gastrointestinal: Positive for abdominal pain. Negative for nausea, vomiting, blood in stool and melena.  Genitourinary: Negative for dysuria.  Musculoskeletal: Negative for myalgias.  Skin: Negative for rash.  Neurological: Negative for dizziness.  Psychiatric/Behavioral: Positive for memory loss and substance abuse.       Alcohol abuse prior to hospitalization     Past Medical History  Diagnosis Date  . Hypertension   . H/O brain surgery   . Aneurysm of anterior cerebral artery   . Hernia   . Anxiety   . Stroke   . Anemia   . Arthritis   . DM2 (diabetes mellitus, type 2) 12/21/2012   Past Surgical History  Procedure Laterality Date  . Laparotomy  01/22/2012    Procedure: EXPLORATORY LAPAROTOMY;  Surgeon: Emelia Loron, MD;  Location: [facility] OR;  Service:  General;  Laterality: N/A;  lysis of adhesions  . Hernia repair    . Esophagogastroduodenoscopy N/A 09/19/2012    Procedure: ESOPHAGOGASTRODUODENOSCOPY (EGD);  Surgeon: Charna Ngozi, MD;  Location: [facility] ENDOSCOPY;  Service: Endoscopy;  Laterality: N/A;  . Colonoscopy N/A 09/20/2012    Procedure: COLONOSCOPY;  Surgeon: Charna Whittney, MD;  Location: [facility] ENDOSCOPY;  Service: Endoscopy;  Laterality: N/A;  . Givens capsule study N/A 09/21/2012    Procedure: GIVENS CAPSULE STUDY;  Surgeon: Charna Reida, MD;  Location: [facility] ENDOSCOPY;  Service: Endoscopy;  Laterality: N/A;  . Enteroscopy N/A 09/28/2012    Procedure: ENTEROSCOPY;  Surgeon: Rachael Fee, MD;  Location: [facility] ENDOSCOPY;  Service: Endoscopy;  Laterality: N/A;  . Upper gastrointestinal endoscopy  10/05/12  . Enteroscopy N/A 10/05/2012    Procedure: ENTEROSCOPY;  Surgeon: Meryl Dare, MD;  Location: [facility] ENDOSCOPY;  Service: Endoscopy;  Laterality: N/A;  . Laparotomy N/A 11/12/2012    Procedure: EXPLORATORY LAPAROTOMY  with repair perforated small bowel, abdominal wall debridement, application of wound vac dressing;  Surgeon: Ernestene Mention, MD;  Location: [facility] ORS;  Service: General;  Laterality: N/A;  . Wound debridement  11/14/2012    Procedure: Washout Abdominal Wound, debridement of Fascia, Partial Closure;  Surgeon: Ardeth Sportsman, MD;  Location: [facility] ORS;  Service: General;;   Social History:   reports that she has never smoked. She has never used smokeless tobacco. She reports that she drinks about 12.0 ounces of alcohol per week. She reports that she does not  use illicit drugs.  Family History  Problem Relation Age of Onset  . Cancer Father     pancreatic  . Cancer Maternal Grandmother     breast    Medications: Patient's Medications  New Prescriptions   No medications on file  Previous Medications   ALBUTEROL (PROVENTIL) (5 MG/ML) 0.5% NEBULIZER SOLUTION    Take 2.5 mg by nebulization 3 (three) times daily as needed for shortness of  breath.   BISACODYL (DULCOLAX) 10 MG SUPPOSITORY    Place 10 mg rectally every 12 (twelve) hours as needed for constipation.   CETYLPYRIDINIUM CHLORIDE 0.05 % LIQD    Use as directed 15 mLs in the mouth or throat every 4 (four) hours as needed (for dry mouth).   CHOLESTYRAMINE (QUESTRAN) 4 G PACKET    Take 1 packet by mouth 2 (two) times daily with a meal.   CIPROFLOXACIN (CIPRO) 400 MG/40ML SOLN    Inject 400 mg into the vein 2 (two) times daily. For UTi   FEEDING SUPPLEMENT (RESOURCE BREEZE) LIQD    Take 1 Container by mouth 2 (two) times daily between meals.   FOLIC ACID (FOLVITE) 1 MG TABLET    Take 1 mg by mouth daily.   FUROSEMIDE (LASIX) 20 MG TABLET    Take 20 mg by mouth daily.   LEVOTHYROXINE (SYNTHROID, LEVOTHROID) 25 MCG TABLET    Take 25 mcg by mouth daily before breakfast.   MULTIPLE VITAMINS-MINERALS (MULTIVITAMIN WITH MINERALS) TABLET    Take 1 tablet by mouth 2 (two) times daily.   ONDANSETRON (ZOFRAN) 4 MG TABLET    Take 4 mg by mouth every 8 (eight) hours as needed for nausea.   QUETIAPINE (SEROQUEL) 25 MG TABLET    Take 25 mg by mouth at bedtime.   SULFAMETHOXAZOLE-TRIMETHOPRIM (BACTRIM DS) 800-160 MG PER TABLET    Take 1 tablet by mouth 2 (two) times daily. For UTI   THIAMINE (VITAMIN B-1) 100 MG TABLET    Take 100 mg by mouth daily.  Modified Medications   No medications on file  Discontinued Medications   ALBUTEROL (PROVENTIL) (5 MG/ML) 0.5% NEBULIZER SOLUTION    Take 0.5 mLs (2.5 mg total) by nebulization 3 (three) times daily.   ANTISEPTIC ORAL RINSE (BIOTENE) LIQD    15 mLs by Mouth Rinse route every 4 (four) hours.   BISACODYL (DULCOLAX) 10 MG SUPPOSITORY    Place 1 suppository (10 mg total) rectally every 12 (twelve) hours as needed.   CHOLESTYRAMINE LIGHT (PREVALITE) 4 G PACKET    Take 1 packet (4 g total) by mouth 2 (two) times daily.   FEEDING SUPPLEMENT (RESOURCE BREEZE) LIQD    Take 1 Container by mouth 2 (two) times daily between meals.   FOLIC ACID (FOLVITE) 1  MG TABLET    Take 1 tablet (1 mg total) by mouth daily.   FUROSEMIDE (LASIX) 20 MG TABLET    Take 1 tablet (20 mg total) by mouth daily with breakfast.   INSULIN ASPART (NOVOLOG) 100 UNIT/ML INJECTION    Inject 0-20 Units into the skin 4 (four) times daily -  before meals and at bedtime.   LEVOTHYROXINE (SYNTHROID, LEVOTHROID) 25 MCG TABLET    Take 1 tablet (25 mcg total) by mouth daily.   LIP BALM (CARMEX) OINTMENT    Apply 1 application topically 2 (two) times daily.   MALTODEXTRIN-XANTHAN GUM (RESOURCE THICKENUP CLEAR) POWD    As needed   MULTIPLE VITAMIN (MULTIVITAMIN WITH MINERALS) TABS    Take  1 tablet by mouth daily.   PANTOPRAZOLE (PROTONIX) 40 MG TABLET    Take 1 tablet (40 mg total) by mouth daily.   QUETIAPINE (SEROQUEL) 25 MG TABLET    Take 1 tablet (25 mg total) by mouth at bedtime.   THIAMINE 100 MG TABLET    Take 1 tablet (100 mg total) by mouth daily.     Physical Exam: Filed Vitals:   12/08/12 1119  BP: 121/82  Pulse: 77  Temp: 98.1 F (36.7 C)  Resp: 18  Weight: 222 lb (100.699 kg)  SpO2: 95%   Physical Exam  Constitutional:  Obese white female with overt jaundice and scleral icterus, anasarca, copious drainage from abdominal wound requiring multiple dressing changes per day, pale yellow in color  HENT:  Head: Normocephalic and atraumatic.  Eyes: EOM are normal. Pupils are equal, round, and reactive to light.  Cardiovascular: Normal rate, regular rhythm, normal heart sounds and intact distal pulses.   Pulmonary/Chest: Effort normal and breath sounds normal. No respiratory distress.  Abdominal: Soft. Bowel sounds are normal. She exhibits no distension. There is tenderness.  Abdominal wound with yellow drainage, no foul smell, but seen oozing  Musculoskeletal: Normal range of motion. She exhibits edema.  Neurological: She is alert.  Pleasant, oriented to person and place, not precise time  Skin: Skin is warm and dry.  jaundice  Psychiatric: She has a normal mood  and affect.      Labs reviewed: Basic Metabolic Panel:  Recent Labs  16/10/96 0340 11/26/12 0507  12/25/12 0420  12/30/12 0455  01/01/13 0525 01/02/13 0510 01/03/13 0550  NA 139 141  < > 125*  < > 136  < > 137 138 137  K 3.7 3.6  < > 4.3  < > 3.5  < > 3.8 3.9 3.8  CL 105 107  < > 99  < > 102  < > 106 106 107  CO2 30 29  < > 16*  < > 23  < > 24 23 22   GLUCOSE 130* 112*  < > 76  < > 89  < > 117* 87 99  BUN 23 25*  < > 24*  < > 42*  < > 48* 51* 53*  CREATININE 0.74 0.79  < > 2.88*  < > 3.26*  < > 3.18* 3.46* 3.84*  CALCIUM 7.8* 7.8*  < > 7.3*  < > 7.9*  < > 7.7* 7.8* 7.7*  MG 2.2 2.2  --  1.9  --   --   --   --   --   --   PHOS  --  3.8  --  5.7*  --  4.3  --   --   --   --   < > = values in this interval not displayed. Liver Function Tests:  Recent Labs  12/27/12 0400 12/29/12 0500 12/30/12 0455 12/31/12 0455  AST 85* 102*  --  102*  ALT 41* 53*  --  53*  ALKPHOS 111 124*  --  130*  BILITOT 14.7* 13.3*  --  14.7*  PROT 5.0* 5.0*  --  4.7*  ALBUMIN 1.5* 1.4* 1.4* 1.2*    Recent Labs  11/03/12 2236 11/11/12 1530 2013-01-02 1610  LIPASE 30 11 33    Recent Labs  2013/01/02 1611 12/31/12 0455 01/03/13 1430  AMMONIA 64* 29 36   CBC:  Recent Labs  11/22/12 0400 11/23/12 0430  01/02/2013 1610  12/31/12 0455 01/01/13 0525 01/02/13 0510  WBC 13.8*  14.4*  < > 12.9*  < > 15.6* 15.4* 14.5*  NEUTROABS 11.8* 12.4*  --  11.1*  --   --   --   --   HGB 8.4* 8.5*  < > 9.2*  < > 9.5* 9.2* 8.5*  HCT 27.3* 27.3*  < > 27.4*  < > 29.0* 27.7* 26.1*  MCV 95.1 94.8  < > 93.5  < > 96.3 97.9 98.5  PLT 103* 116*  < > 120*  < > 86* 87* 87*  < > = values in this interval not displayed. Cardiac Enzymes:  Recent Labs  January 20, 2013 2040  TROPONINI <0.30   BNP: No components found with this basename: POCBNP,  CBG:  Recent Labs  01/04/13 0949 01/04/13 1029 01/04/13 1147  GLUCAP 62* 118* 87    Radiological Exams: 11/11/12:  CXR:  No active cardiopulmonary disease. Chronic  changes. 11/11/12 KUB:  Gas-filled mid abdominal loops are not as distended as previously noted, but mildly abnormal. Findings consistent with ileus although  early obstruction not excluded. Previously noted right upper quadrant radiopaque density has transited to the left side of the abdomen. 11/12/12:  Diagnostic abdomen:  Study is positive for free intraperitoneal air consistent with  bowel perforation.  11/19/12:  No oral contrast extravasation to suggest leak/perforation. No  free air.  Cirrhosis. Venous collaterals between the portable at confluence  in the IVC. Portal vein remains patent.  1.9 x 1.5 cm hypoenhancing lesion in the lateral segment left  hepatic lobe, indeterminate, unchanged since 2013. Correlate with  serum AFP and consider follow-up MRI abdomen with/without contrast  for further characterization as an outpatient.  Moderate abdominopelvic ascites. Body wall edema.  Suspected right middle lobe and possible left lower lobe pneumonia.  Small left and trace right pleural effusions. 11/27/12:  CXR:  Interval improvement in pulmonary vascular congestion and edema  pattern. Left lower lobe density, question atelectasis, residual  focal alveolar edema versus infiltrate.  11/28/12:  CT neck:  I do not believe the CT scan shows an explanation for the patient's  abnormal phonation. There is the appearance of an outpouching in  the trachea at the thoracic inlet, but I believe this is  artifactual and related to patient motion during the scan. See  above discussion. 12/01/12:  Modified barium swallow reviewed  Assessment/Plan 1. 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 Derrell Lolling tomorrow due to increased drainage from abdominal wound s/p primary repair of perforated  jejunum with peritonitis  2. DM2 (diabetes mellitus, type 2) -continue current therapy, po intake poor so monitor closely for hypoglycemia  3. Delirium -persists--staff note fluctuating sensorium -suspect she is not out of the woods after her infection and respiratory failure, not doing well  4. Abdominal pain, generalized --ordered f/u labs to reassess with cbc, bmp, liver panel S/p perforated jejunum s/p primary repair 5/814 complicated by sepsis from petitonitis and then respiratory failure, continue abdominal binder and wet to dry dressing bid to wound, f/u Dr. Derrell Lolling as planned tomorrow due to massive drainage  5. Septic shock --f/u cbc  --completed abx therapy  6. HYPERTENSION --at goal with current therapy  Family/ staff Communication: discussed with unit supervisor  Goals of care: Full code--need care plan meeting with her husband arranged  Labs/tests ordered:  Cbc, bmp, liver panel next draw

## 2012-12-09 ENCOUNTER — Encounter (INDEPENDENT_AMBULATORY_CARE_PROVIDER_SITE_OTHER): Payer: Self-pay | Admitting: Surgery

## 2012-12-09 ENCOUNTER — Ambulatory Visit (INDEPENDENT_AMBULATORY_CARE_PROVIDER_SITE_OTHER): Payer: BC Managed Care – PPO | Admitting: Surgery

## 2012-12-09 VITALS — BP 130/88 | HR 92 | Temp 98.1°F | Resp 14

## 2012-12-09 DIAGNOSIS — Z09 Encounter for follow-up examination after completed treatment for conditions other than malignant neoplasm: Secondary | ICD-10-CM

## 2012-12-09 NOTE — Progress Notes (Signed)
Subjective:     Patient ID: [name] [name], female   DOB: [birth year], 67 y.o.   MRN: [id number]  HPI She is here for her first postop visit status post exploratory laparotomy for a perforated jejunum by Dr. Derrell Lolling. She returned to the operating room for fascial closure. This was done in early May. She is currently in a rehabilitation facility. She is undergoing twice daily wet to dry dressing changes. She is here today because of increased drainage. She denies any abdominal pain. Her bowels are working well  Review of Systems     Objective:   Physical Exam On exam, she is jaundiced. She has a large midline wound. There is central necrosis but the sutures are intact. I could not find a opening in the fascia. There was no evidence of infection    Assessment:     Nonhealing surgical wound     Plan:     Given her liver disease, this could represent leaking ascites. I am recommending wound VAC placement to see if this will help with granulation tissue formation. I currently do not want to do sharp debridement for fear of fashion breakdown and I did not want to remove the sutures. I will have her see Dr. Derrell Lolling back in approximately 3 weeks

## 2012-12-15 ENCOUNTER — Encounter: Payer: Self-pay | Admitting: Internal Medicine

## 2012-12-15 NOTE — Progress Notes (Deleted)
Patient ID: [name] [name], female   DOB: 1945-09-27, 67 y.o.   MRN: [id number]  Chief Complaint: ***  HPI:  ***  Review of Systems:  *** (delete stars and type .ros)  Medications: Patient's Medications  New Prescriptions   No medications on file  Previous Medications   ALBUTEROL (PROVENTIL) (5 MG/ML) 0.5% NEBULIZER SOLUTION    Take 0.5 mLs (2.5 mg total) by nebulization 3 (three) times daily.   ANTISEPTIC ORAL RINSE (BIOTENE) LIQD    15 mLs by Mouth Rinse route every 4 (four) hours.   BENAZEPRIL (LOTENSIN) 20 MG TABLET       BISACODYL (DULCOLAX) 10 MG SUPPOSITORY    Place 1 suppository (10 mg total) rectally every 12 (twelve) hours as needed.   CHOLESTYRAMINE LIGHT (PREVALITE) 4 G PACKET    Take 1 packet (4 g total) by mouth 2 (two) times daily.   FEEDING SUPPLEMENT (RESOURCE BREEZE) LIQD    Take 1 Container by mouth 2 (two) times daily between meals.   FOLIC ACID (FOLVITE) 1 MG TABLET    Take 1 tablet (1 mg total) by mouth daily.   FUROSEMIDE (LASIX) 20 MG TABLET    Take 1 tablet (20 mg total) by mouth daily with breakfast.   INSULIN ASPART (NOVOLOG) 100 UNIT/ML INJECTION    Inject 0-20 Units into the skin 4 (four) times daily -  before meals and at bedtime.   LABETALOL (NORMODYNE) 100 MG TABLET       LEVOTHYROXINE (SYNTHROID, LEVOTHROID) 25 MCG TABLET    Take 1 tablet (25 mcg total) by mouth daily.   LIP BALM (CARMEX) OINTMENT    Apply 1 application topically 2 (two) times daily.   MALTODEXTRIN-XANTHAN GUM (RESOURCE THICKENUP CLEAR) POWD    As needed   MULTIPLE VITAMIN (MULTIVITAMIN WITH MINERALS) TABS    Take 1 tablet by mouth daily.   ONDANSETRON (ZOFRAN) 4 MG TABLET    Take 4 mg by mouth every 8 (eight) hours as needed for nausea.   PANTOPRAZOLE (PROTONIX) 40 MG TABLET    Take 1 tablet (40 mg total) by mouth daily.   QUETIAPINE (SEROQUEL) 25 MG TABLET    Take 1 tablet (25 mg total) by mouth at bedtime.   THIAMINE 100 MG TABLET    Take 1 tablet (100 mg total) by mouth daily.   Modified Medications   No medications on file  Discontinued Medications   No medications on file     Physical Exam: *** (delete stars and type .physexam)  There were no vitals filed for this visit.    Labs reviewed: Basic Metabolic Panel:  Recent Labs  40/98/11 0440  11/23/12 0430 11/24/12 0400 11/25/12 0340 11/26/12 0507  12/01/12 0500 12/02/12 0425 12/03/12 0410  NA 140  < > 139 139 139 141  < > 135 137 136  K 3.2*  < > 3.5 3.5 3.7 3.6  < > 3.7 3.7 3.5  CL 105  < > 104 103 105 107  < > 106 107 107  CO2 32  < > 30 32 30 29  < > 22 23 22   GLUCOSE 144*  < > 157* 162* 130* 112*  < > 76 121* 98  BUN 27*  < > 25* 24* 23 25*  < > 18 15 13   CREATININE 0.89  < > 0.80 0.76 0.74 0.79  < > 1.05 0.93 0.84  CALCIUM 8.0*  < > 7.9* 7.7* 7.8* 7.8*  < > 7.4* 7.4* 7.5*  MG  2.0  --  1.8 2.1 2.2 2.2  --   --   --   --   PHOS 3.6  --  3.2  --   --  3.8  --   --   --   --   < > = values in this interval not displayed.  Liver Function Tests:  Recent Labs  11/23/12 0430 11/26/12 0507 11/29/12 0500  AST 104* 75* 76*  ALT 36* 29 28  ALKPHOS 149* 160* 154*  BILITOT 3.8* 3.2* 4.3*  PROT 5.9* 6.3 6.6  ALBUMIN 1.3* 1.2* 1.1*    CBC:  Recent Labs  11/13/12 1045  11/22/12 0400 11/23/12 0430  12/01/12 0500 12/02/12 0425 12/03/12 0410  WBC 12.7*  < > 13.8* 14.4*  < > 7.9 9.4 12.1*  NEUTROABS 11.0*  --  11.8* 12.4*  --   --   --   --   HGB 7.7*  < > 8.4* 8.5*  < > 7.9* 7.8* 8.1*  HCT 24.7*  < > 27.3* 27.3*  < > 25.0* 25.4* 26.2*  MCV 96.5  < > 95.1 94.8  < > 94.7 95.1 94.2  PLT 138*  < > 103* 116*  < > 178 174 203  < > = values in this interval not displayed.  Significant Diagnostic Results:     Assessment/Plan No problem-specific assessment & plan notes found for this encounter.     Family/ staff Communication: ***   Goals of care: ***   Labs/tests ordered    This encounter was created in error - please disregard.

## 2012-12-21 ENCOUNTER — Other Ambulatory Visit: Payer: Self-pay | Admitting: Internal Medicine

## 2012-12-21 DIAGNOSIS — A4901 Methicillin susceptible Staphylococcus aureus infection, unspecified site: Secondary | ICD-10-CM

## 2012-12-22 ENCOUNTER — Non-Acute Institutional Stay (SKILLED_NURSING_FACILITY): Payer: BC Managed Care – PPO | Admitting: Internal Medicine

## 2012-12-22 ENCOUNTER — Other Ambulatory Visit: Payer: Self-pay | Admitting: Internal Medicine

## 2012-12-22 ENCOUNTER — Telehealth (INDEPENDENT_AMBULATORY_CARE_PROVIDER_SITE_OTHER): Payer: Self-pay

## 2012-12-22 ENCOUNTER — Encounter: Payer: Self-pay | Admitting: Internal Medicine

## 2012-12-22 ENCOUNTER — Ambulatory Visit (HOSPITAL_COMMUNITY)
Admission: RE | Admit: 2012-12-22 | Discharge: 2012-12-22 | Disposition: A | Discharge status: Home / Self Care | Payer: BC Managed Care – PPO | Source: Ambulatory Visit | Attending: Internal Medicine | Admitting: Internal Medicine

## 2012-12-22 DIAGNOSIS — A412 Sepsis due to unspecified staphylococcus: Principal | ICD-10-CM | POA: Diagnosis present

## 2012-12-22 DIAGNOSIS — Z794 Long term (current) use of insulin: Secondary | ICD-10-CM

## 2012-12-22 DIAGNOSIS — A419 Sepsis, unspecified organism: Secondary | ICD-10-CM | POA: Diagnosis present

## 2012-12-22 DIAGNOSIS — B9561 Methicillin susceptible Staphylococcus aureus infection as the cause of diseases classified elsewhere: Secondary | ICD-10-CM | POA: Insufficient documentation

## 2012-12-22 DIAGNOSIS — K7460 Unspecified cirrhosis of liver: Secondary | ICD-10-CM

## 2012-12-22 DIAGNOSIS — F1020 Alcohol dependence, uncomplicated: Secondary | ICD-10-CM | POA: Diagnosis present

## 2012-12-22 DIAGNOSIS — E872 Acidosis, unspecified: Secondary | ICD-10-CM | POA: Diagnosis present

## 2012-12-22 DIAGNOSIS — D696 Thrombocytopenia, unspecified: Secondary | ICD-10-CM | POA: Diagnosis present

## 2012-12-22 DIAGNOSIS — IMO0002 Reserved for concepts with insufficient information to code with codable children: Secondary | ICD-10-CM

## 2012-12-22 DIAGNOSIS — K7290 Hepatic failure, unspecified without coma: Secondary | ICD-10-CM | POA: Diagnosis present

## 2012-12-22 DIAGNOSIS — G479 Sleep disorder, unspecified: Secondary | ICD-10-CM | POA: Diagnosis present

## 2012-12-22 DIAGNOSIS — K7030 Alcoholic cirrhosis of liver without ascites: Secondary | ICD-10-CM | POA: Diagnosis present

## 2012-12-22 DIAGNOSIS — R6520 Severe sepsis without septic shock: Secondary | ICD-10-CM | POA: Diagnosis present

## 2012-12-22 DIAGNOSIS — N179 Acute kidney failure, unspecified: Secondary | ICD-10-CM | POA: Diagnosis present

## 2012-12-22 DIAGNOSIS — E876 Hypokalemia: Secondary | ICD-10-CM | POA: Diagnosis present

## 2012-12-22 DIAGNOSIS — R6521 Severe sepsis with septic shock: Secondary | ICD-10-CM | POA: Diagnosis present

## 2012-12-22 DIAGNOSIS — K7010 Alcoholic hepatitis without ascites: Secondary | ICD-10-CM

## 2012-12-22 DIAGNOSIS — K631 Perforation of intestine (nontraumatic): Secondary | ICD-10-CM

## 2012-12-22 DIAGNOSIS — R7881 Bacteremia: Secondary | ICD-10-CM

## 2012-12-22 DIAGNOSIS — K769 Liver disease, unspecified: Secondary | ICD-10-CM | POA: Diagnosis present

## 2012-12-22 DIAGNOSIS — A4901 Methicillin susceptible Staphylococcus aureus infection, unspecified site: Secondary | ICD-10-CM

## 2012-12-22 DIAGNOSIS — E871 Hypo-osmolality and hyponatremia: Secondary | ICD-10-CM | POA: Diagnosis present

## 2012-12-22 DIAGNOSIS — J9600 Acute respiratory failure, unspecified whether with hypoxia or hypercapnia: Secondary | ICD-10-CM | POA: Diagnosis present

## 2012-12-22 DIAGNOSIS — N390 Urinary tract infection, site not specified: Secondary | ICD-10-CM | POA: Insufficient documentation

## 2012-12-22 DIAGNOSIS — K767 Hepatorenal syndrome: Secondary | ICD-10-CM | POA: Diagnosis present

## 2012-12-22 DIAGNOSIS — K7682 Hepatic encephalopathy: Secondary | ICD-10-CM | POA: Diagnosis present

## 2012-12-22 DIAGNOSIS — Z8673 Personal history of transient ischemic attack (TIA), and cerebral infarction without residual deficits: Secondary | ICD-10-CM

## 2012-12-22 DIAGNOSIS — D638 Anemia in other chronic diseases classified elsewhere: Secondary | ICD-10-CM | POA: Diagnosis present

## 2012-12-22 DIAGNOSIS — D684 Acquired coagulation factor deficiency: Secondary | ICD-10-CM | POA: Diagnosis present

## 2012-12-22 MED ORDER — CIPROFLOXACIN 400 MG/40ML IV SOLN: 400.0000 mg | Freq: Two times a day (BID) | INTRAVENOUS | Status: DC

## 2012-12-22 NOTE — Assessment & Plan Note (Signed)
Is currently on bactrim DS therapy for this-klebsiella UTI.  She had leukocytosis and suprapubic tenderness.  This is susceptible to cipro also which I am starting her on for her staph bacteremia so will d/c bactrim.

## 2012-12-22 NOTE — Assessment & Plan Note (Signed)
Rapidly progressive since it was noted in March of this year.  Was still drinking in early may of this year.

## 2012-12-22 NOTE — Assessment & Plan Note (Signed)
Was drinking until her latest hospitalization last month.  Appears this has rapidly progressed to cirrhosis.  I have asked staff to set up an appointment with Dr. Christella Hartigan for more information about her cirrhosis and prognosis at this point.  I have ordered a repeat liver panel.  This is interfering with her wound healing, progress with therapy, and I suspect her prognosis is quite poor given her jaundice and ascites.

## 2012-12-22 NOTE — Assessment & Plan Note (Signed)
Continues with wound vac in place due to massive secretions from abdomen (due to ascites).  Not overtly tender and without any over signs of infection.

## 2012-12-22 NOTE — Procedures (Signed)
Procedure:  PICC line Access:  Right brachial vein Findings:  37 cm DL PICC with tip in lower SVC.  OK to use.

## 2012-12-22 NOTE — Progress Notes (Signed)
Patient ID: [name] [name], female   DOB: 04/21/1946, 67 y.o.   MRN: [id number] Location:  [facility]  Provider:  Gwenith Spitz. Renato Gails, D.O., C.M.D.  Code Status:  Full code--reviewed with her husband today who is thinking over whether her code status should be changed at this stage  Chief Complaint: deteriorating strength  HPI: 67 yo female with h/o cirrhosis with alcohol abuse until this last admission in May 2014, multiple abdominal surgeries for hernia repairs, GI bleed, stroke with ruptured cerebral aneurysm seen today due to progressive weakness, leukocytosis, UTI, and 1/2 positive blood cultures for staph aureus.  She has been getting weaker and more jaundice since she has been here.  Her abdominal wound vac (from perforated jejunum with peritonitis) has been draining massive volumes of fluid the past three days.  She's developed increased ascites and anasarca.  Her jaundice has been progressive.  I met with her husband today to discuss her current condition, prognosis and goals of care.  I did explain that I feel she has a poor prognosis.  He asked about her chance of returning home, and I suggested home with hospice care as a possibility, but I do not foresee her returning home independent in her activities of daily living or ambulatory in the near future unless she improves dramatically after she completes her antibiotics for UTI and her IV antibiotics for her positive blood culture.   Her protein levels are very low and she her hepatic failure interferes with her getting stronger and recovering.  I suggested we get more information from Dr. Christella Hartigan (GI) about her cirrhosis and prognosis based on that because that seems to be her largest underlying condition.    Review of Systems:  Review of Systems  Constitutional: Positive for malaise/fatigue. Negative for fever, chills and weight loss.  HENT: Negative for congestion.   Eyes: Negative for blurred vision.  Respiratory:  Negative for cough and shortness of breath.   Cardiovascular: Positive for leg swelling. Negative for chest pain.  Gastrointestinal: Positive for abdominal pain. Negative for heartburn, constipation, blood in stool and melena.  Genitourinary: Negative for dysuria and urgency.  Musculoskeletal: Negative for back pain and falls.  Skin: Negative for rash.       jaundice  Neurological: Positive for weakness. Negative for dizziness and headaches.  Endo/Heme/Allergies: Bruises/bleeds easily.  Psychiatric/Behavioral: The patient does not have insomnia.      Medications: Patient's Medications  New Prescriptions   No medications on file  Previous Medications   ALBUTEROL (PROVENTIL) (5 MG/ML) 0.5% NEBULIZER SOLUTION    Take 0.5 mLs (2.5 mg total) by nebulization 3 (three) times daily.   ANTISEPTIC ORAL RINSE (BIOTENE) LIQD    15 mLs by Mouth Rinse route every 4 (four) hours.   BENAZEPRIL (LOTENSIN) 20 MG TABLET       BISACODYL (DULCOLAX) 10 MG SUPPOSITORY    Place 1 suppository (10 mg total) rectally every 12 (twelve) hours as needed.   CHOLESTYRAMINE LIGHT (PREVALITE) 4 G PACKET    Take 1 packet (4 g total) by mouth 2 (two) times daily.   FEEDING SUPPLEMENT (RESOURCE BREEZE) LIQD    Take 1 Container by mouth 2 (two) times daily between meals.   FOLIC ACID (FOLVITE) 1 MG TABLET    Take 1 tablet (1 mg total) by mouth daily.   FUROSEMIDE (LASIX) 20 MG TABLET    Take 1 tablet (20 mg total) by mouth daily with breakfast.   INSULIN ASPART (NOVOLOG) 100 UNIT/ML  INJECTION    Inject 0-20 Units into the skin 4 (four) times daily -  before meals and at bedtime.   LABETALOL (NORMODYNE) 100 MG TABLET       LEVOTHYROXINE (SYNTHROID, LEVOTHROID) 25 MCG TABLET    Take 1 tablet (25 mcg total) by mouth daily.   LIP BALM (CARMEX) OINTMENT    Apply 1 application topically 2 (two) times daily.   MALTODEXTRIN-XANTHAN GUM (RESOURCE THICKENUP CLEAR) POWD    As needed   MULTIPLE VITAMIN (MULTIVITAMIN WITH MINERALS)  TABS    Take 1 tablet by mouth daily.   ONDANSETRON (ZOFRAN) 4 MG TABLET    Take 4 mg by mouth every 8 (eight) hours as needed for nausea.   PANTOPRAZOLE (PROTONIX) 40 MG TABLET    Take 1 tablet (40 mg total) by mouth daily.   QUETIAPINE (SEROQUEL) 25 MG TABLET    Take 1 tablet (25 mg total) by mouth at bedtime.   THIAMINE 100 MG TABLET    Take 1 tablet (100 mg total) by mouth daily.  Modified Medications   No medications on file  Discontinued Medications   No medications on file    Physical Exam: There were no vitals filed for this visit. Physical Exam  Constitutional:  Obese, chronically ill-appearing Caucasian female, overtly jaundice with scleral icterus  HENT:  Head: Normocephalic and atraumatic.  Eyes: Pupils are equal, round, and reactive to light.  Neck: Normal range of motion.  Cardiovascular: Normal rate, regular rhythm and normal heart sounds.   Pulmonary/Chest: Effort normal and breath sounds normal.  Abdominal: Soft. There is tenderness.  Wound vac in place with large volume drainage, no erythema surrounding wound, mild tenderness at lower aspect of wound  Musculoskeletal: She exhibits edema. She exhibits no tenderness.  Neurological: She is alert. No cranial nerve deficit.  Skin: Skin is warm and dry.  jaundice  Psychiatric: She has a normal mood and affect.   Labs reviewed: WBC 12 UA c+s:  Positive with culture growing klebsiella sensitive to all but nitrofurantoin and ampicillin 1/2 positive blood cultures:  Staph aureus pansensitive  Basic Metabolic Panel:  Recent Labs  78/29/56 0440  11/23/12 0430 11/24/12 0400 11/25/12 0340 11/26/12 0507  12/01/12 0500 12/02/12 0425 12/03/12 0410  NA 140  < > 139 139 139 141  < > 135 137 136  K 3.2*  < > 3.5 3.5 3.7 3.6  < > 3.7 3.7 3.5  CL 105  < > 104 103 105 107  < > 106 107 107  CO2 32  < > 30 32 30 29  < > 22 23 22   GLUCOSE 144*  < > 157* 162* 130* 112*  < > 76 121* 98  BUN 27*  < > 25* 24* 23 25*  < > 18 15  13   CREATININE 0.89  < > 0.80 0.76 0.74 0.79  < > 1.05 0.93 0.84  CALCIUM 8.0*  < > 7.9* 7.7* 7.8* 7.8*  < > 7.4* 7.4* 7.5*  MG 2.0  --  1.8 2.1 2.2 2.2  --   --   --   --   PHOS 3.6  --  3.2  --   --  3.8  --   --   --   --   < > = values in this interval not displayed.  Liver Function Tests:  Recent Labs  11/23/12 0430 11/26/12 0507 11/29/12 0500  AST 104* 75* 76*  ALT 36* 29 28  ALKPHOS 149* 160*  154*  BILITOT 3.8* 3.2* 4.3*  PROT 5.9* 6.3 6.6  ALBUMIN 1.3* 1.2* 1.1*    CBC:  Recent Labs  11/13/12 1045  11/22/12 0400 11/23/12 0430  12/01/12 0500 12/02/12 0425 12/03/12 0410  WBC 12.7*  < > 13.8* 14.4*  < > 7.9 9.4 12.1*  NEUTROABS 11.0*  --  11.8* 12.4*  --   --   --   --   HGB 7.7*  < > 8.4* 8.5*  < > 7.9* 7.8* 8.1*  HCT 24.7*  < > 27.3* 27.3*  < > 25.0* 25.4* 26.2*  MCV 96.5  < > 95.1 94.8  < > 94.7 95.1 94.2  PLT 138*  < > 103* 116*  < > 178 174 203  < > = values in this interval not displayed.  Significant Diagnostic Results:  PICC placed today for IV abx  Assessment/Plan No problem-specific assessment & plan notes found for this encounter. UTI (urinary tract infection) Is currently on bactrim DS therapy for this-klebsiella UTI.  She had leukocytosis and suprapubic tenderness.  This is susceptible to cipro also which I am starting her on for her staph bacteremia so will d/c bactrim.    Bacteremia due to Staphylococcus aureus PICC placed this am and started on IV abx with cipro today for 14 day course and yogurt with meals (florastor contraindicated with picc).  Await culture report for ascitic fluid--concern was SBP.    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  Not overtly tender and without any over signs of infection.    Alcoholic hepatitis Was drinking until her latest hospitalization last month.  Appears this has rapidly progressed to cirrhosis.  I have  asked staff to set up an appointment with Dr. Christella Hartigan for more information about her cirrhosis and prognosis at this point.  I have ordered a repeat liver panel.  This is interfering with her wound healing, progress with therapy, and I suspect her prognosis is quite poor given her jaundice and ascites.    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  Was still drinking in early may of this year.     Family/ staff Communication: 20 minutes spent discussing goals of care with her husband today.    Goals of care: full code at this time.  Discussed that she is unlikely to survive a resuscitation at this point due to her weakness and liver failure.  Labs/tests ordered:  Cbc, bmp, liver panel;  F/u with Dr. Christella Hartigan re: cirrhosis for more definitive prognosis

## 2012-12-22 NOTE — Assessment & Plan Note (Signed)
PICC placed this am and started on IV abx with cipro today for 14 day course and yogurt with meals (florastor contraindicated with picc).  Await culture report for ascitic fluid--concern was SBP.

## 2012-12-22 NOTE — Telephone Encounter (Signed)
I called and left the pt a message to call me.  I want to move her appointment up sooner from July.  I have time tomorrow and on June 6/26 with Dr Derrell Lolling.

## 2012-12-23 ENCOUNTER — Ambulatory Visit (INDEPENDENT_AMBULATORY_CARE_PROVIDER_SITE_OTHER): Payer: BC Managed Care – PPO | Admitting: General Surgery

## 2012-12-23 ENCOUNTER — Inpatient Hospital Stay (HOSPITAL_COMMUNITY): Payer: BC Managed Care – PPO

## 2012-12-23 ENCOUNTER — Telehealth (HOSPITAL_COMMUNITY): Payer: Self-pay | Admitting: *Deleted

## 2012-12-23 ENCOUNTER — Encounter (HOSPITAL_COMMUNITY): Payer: Self-pay | Admitting: Pulmonary Disease

## 2012-12-23 ENCOUNTER — Inpatient Hospital Stay (HOSPITAL_COMMUNITY)
Admission: EM | Admit: 2012-12-23 | Discharge: 2013-02-05 | DRG: 584 | Disposition: E | Payer: BC Managed Care – PPO | Attending: Internal Medicine | Admitting: Internal Medicine

## 2012-12-23 ENCOUNTER — Telehealth (INDEPENDENT_AMBULATORY_CARE_PROVIDER_SITE_OTHER): Payer: Self-pay

## 2012-12-23 ENCOUNTER — Emergency Department (HOSPITAL_COMMUNITY): Payer: BC Managed Care – PPO

## 2012-12-23 ENCOUNTER — Encounter (INDEPENDENT_AMBULATORY_CARE_PROVIDER_SITE_OTHER): Payer: Self-pay | Admitting: General Surgery

## 2012-12-23 VITALS — BP 120/78 | HR 72 | Resp 18 | Wt 151.0 lb

## 2012-12-23 DIAGNOSIS — K631 Perforation of intestine (nontraumatic): Secondary | ICD-10-CM

## 2012-12-23 DIAGNOSIS — E871 Hypo-osmolality and hyponatremia: Secondary | ICD-10-CM

## 2012-12-23 DIAGNOSIS — D689 Coagulation defect, unspecified: Secondary | ICD-10-CM

## 2012-12-23 DIAGNOSIS — I959 Hypotension, unspecified: Secondary | ICD-10-CM

## 2012-12-23 DIAGNOSIS — E162 Hypoglycemia, unspecified: Secondary | ICD-10-CM

## 2012-12-23 DIAGNOSIS — R7881 Bacteremia: Secondary | ICD-10-CM

## 2012-12-23 DIAGNOSIS — N390 Urinary tract infection, site not specified: Secondary | ICD-10-CM

## 2012-12-23 DIAGNOSIS — R0600 Dyspnea, unspecified: Secondary | ICD-10-CM

## 2012-12-23 DIAGNOSIS — F411 Generalized anxiety disorder: Secondary | ICD-10-CM

## 2012-12-23 DIAGNOSIS — E119 Type 2 diabetes mellitus without complications: Secondary | ICD-10-CM

## 2012-12-23 DIAGNOSIS — D649 Anemia, unspecified: Secondary | ICD-10-CM

## 2012-12-23 DIAGNOSIS — A419 Sepsis, unspecified organism: Secondary | ICD-10-CM

## 2012-12-23 DIAGNOSIS — K8020 Calculus of gallbladder without cholecystitis without obstruction: Secondary | ICD-10-CM

## 2012-12-23 DIAGNOSIS — D696 Thrombocytopenia, unspecified: Secondary | ICD-10-CM

## 2012-12-23 DIAGNOSIS — K7460 Unspecified cirrhosis of liver: Secondary | ICD-10-CM

## 2012-12-23 DIAGNOSIS — R1084 Generalized abdominal pain: Secondary | ICD-10-CM

## 2012-12-23 DIAGNOSIS — K7200 Acute and subacute hepatic failure without coma: Secondary | ICD-10-CM

## 2012-12-23 DIAGNOSIS — Z515 Encounter for palliative care: Secondary | ICD-10-CM

## 2012-12-23 DIAGNOSIS — M549 Dorsalgia, unspecified: Secondary | ICD-10-CM

## 2012-12-23 DIAGNOSIS — N179 Acute kidney failure, unspecified: Secondary | ICD-10-CM

## 2012-12-23 DIAGNOSIS — R6521 Severe sepsis with septic shock: Secondary | ICD-10-CM

## 2012-12-23 DIAGNOSIS — R410 Disorientation, unspecified: Secondary | ICD-10-CM

## 2012-12-23 HISTORY — DX: Type 2 diabetes mellitus without complications: E11.9

## 2012-12-23 LAB — TYPE AND SCREEN
ABO/RH(D): O NEG
Antibody Screen: NEGATIVE

## 2012-12-23 LAB — COMPREHENSIVE METABOLIC PANEL
ALT: 18 U/L (ref 0–35)
Alkaline Phosphatase: 117 U/L (ref 39–117)
CO2: 18 mEq/L — ABNORMAL LOW (ref 19–32)
GFR calc Af Amer: 19 mL/min — ABNORMAL LOW (ref >90)
GFR calc non Af Amer: 17 mL/min — ABNORMAL LOW (ref >90)
Glucose, Bld: 82 mg/dL (ref 70–99)
Potassium: 5.1 mEq/L (ref 3.5–5.1)
Sodium: 122 mEq/L — ABNORMAL LOW (ref 135–145)
Total Bilirubin: 15.7 mg/dL — ABNORMAL HIGH (ref 0.3–1.2)

## 2012-12-23 LAB — URINE MICROSCOPIC-ADD ON

## 2012-12-23 LAB — CBC WITH DIFFERENTIAL/PLATELET
Basophils Relative: 0 % (ref 0–1)
Eosinophils Relative: 1 % (ref 0–5)
Hemoglobin: 9.2 g/dL — ABNORMAL LOW (ref 12.0–15.0)
Lymphocytes Relative: 6 % — ABNORMAL LOW (ref 12–46)
MCH: 31.4 pg (ref 26.0–34.0)
Monocytes Absolute: 0.9 10*3/uL (ref 0.1–1.0)
Neutrophils Relative %: 86 % — ABNORMAL HIGH (ref 43–77)
RBC: 2.93 MIL/uL — ABNORMAL LOW (ref 3.87–5.11)

## 2012-12-23 LAB — URINALYSIS, ROUTINE W REFLEX MICROSCOPIC
Glucose, UA: NEGATIVE mg/dL
Hgb urine dipstick: NEGATIVE
Ketones, ur: 15 mg/dL — AB
Protein, ur: NEGATIVE mg/dL

## 2012-12-23 LAB — BILIRUBIN, FRACTIONATED(TOT/DIR/INDIR): Bilirubin, Direct: 9.5 mg/dL — ABNORMAL HIGH (ref 0.0–0.3)

## 2012-12-23 LAB — GLUCOSE, CAPILLARY
Glucose-Capillary: 78 mg/dL (ref 70–99)
Glucose-Capillary: 125 mg/dL — ABNORMAL HIGH (ref 70–99)
Glucose-Capillary: 61 mg/dL — ABNORMAL LOW (ref 70–99)

## 2012-12-23 LAB — CARBOXYHEMOGLOBIN
Carboxyhemoglobin: 1.5 % (ref 0.5–1.5)
O2 Saturation: 71.9 %

## 2012-12-23 LAB — PROTIME-INR: Prothrombin Time: 43.3 seconds — ABNORMAL HIGH (ref 11.6–15.2)

## 2012-12-23 MED ORDER — SODIUM CHLORIDE 0.9 % IV SOLN: 1000.0000 mL | Freq: Once | INTRAVENOUS | Status: DC

## 2012-12-23 MED ORDER — NOREPINEPHRINE BITARTRATE 1 MG/ML IJ SOLN
5.0000 ug/min | INTRAVENOUS | Status: DC
  Administered 2012-12-23 – 2012-12-24 (×2): 10 ug/min via INTRAVENOUS
  Filled 2012-12-23 (×2): qty 4

## 2012-12-23 MED ORDER — VANCOMYCIN HCL IN DEXTROSE 1-5 GM/200ML-% IV SOLN
1000.0000 mg | INTRAVENOUS | Status: DC
  Administered 2012-12-23 – 2012-12-25 (×2): 1000 mg via INTRAVENOUS
  Filled 2012-12-23 (×2): qty 200

## 2012-12-23 MED ORDER — VITAMIN K1 10 MG/ML IJ SOLN
10.0000 mg | Freq: Once | INTRAVENOUS | Status: AC
  Administered 2012-12-24: 10 mg via INTRAVENOUS
  Filled 2012-12-23: qty 1

## 2012-12-23 MED ORDER — SODIUM CHLORIDE 0.9 % IV SOLN: 1000.0000 mL | INTRAVENOUS | Status: DC

## 2012-12-23 MED ORDER — IOHEXOL 300 MG/ML  SOLN
25.0000 mL | INTRAMUSCULAR | Status: AC
  Administered 2012-12-24: 25 mL via ORAL

## 2012-12-23 MED ORDER — LEVOTHYROXINE SODIUM 25 MCG PO TABS
25.0000 ug | ORAL_TABLET | Freq: Every day | ORAL | Status: DC
  Administered 2012-12-24 – 2013-01-04 (×12): 25 ug via ORAL
  Filled 2012-12-23 (×14): qty 1

## 2012-12-23 MED ORDER — SODIUM CHLORIDE 0.9 % IV SOLN
250.0000 mg | Freq: Two times a day (BID) | INTRAVENOUS | Status: DC
  Administered 2012-12-23 – 2012-12-24 (×2): 250 mg via INTRAVENOUS
  Filled 2012-12-23 (×3): qty 250

## 2012-12-23 MED ORDER — SODIUM CHLORIDE 0.9 % IV SOLN: 250.0000 mL | INTRAVENOUS | Status: DC | PRN

## 2012-12-23 MED ORDER — SODIUM CHLORIDE 0.9 % IJ SOLN
3.0000 mL | Freq: Two times a day (BID) | INTRAMUSCULAR | Status: DC
  Administered 2012-12-24 – 2012-12-27 (×7): 3 mL via INTRAVENOUS

## 2012-12-23 MED ORDER — DEXTROSE 50 % IV SOLN
INTRAVENOUS | Status: AC
  Administered 2012-12-23: 50 mL
  Filled 2012-12-23: qty 50

## 2012-12-23 MED ORDER — SODIUM CHLORIDE 0.9 % IV SOLN
1000.0000 mL | Freq: Once | INTRAVENOUS | Status: AC
  Administered 2012-12-23: 1000 mL via INTRAVENOUS

## 2012-12-23 MED ORDER — SODIUM CHLORIDE 0.9 % IV BOLUS (SEPSIS)
1000.0000 mL | Freq: Once | INTRAVENOUS | Status: AC
  Administered 2012-12-23: 1000 mL via INTRAVENOUS

## 2012-12-23 MED ORDER — SODIUM CHLORIDE 0.9 % IJ SOLN
3.0000 mL | INTRAMUSCULAR | Status: DC | PRN
  Administered 2012-12-24: 3 mL via INTRAVENOUS

## 2012-12-23 MED ORDER — INSULIN ASPART 100 UNIT/ML ~~LOC~~ SOLN: 2.0000 [IU] | SUBCUTANEOUS | Status: DC

## 2012-12-23 MED ORDER — SODIUM CHLORIDE 0.9 % IV SOLN
250.0000 mL | INTRAVENOUS | Status: DC | PRN
  Administered 2012-12-24: 250 mL via INTRAVENOUS

## 2012-12-23 MED ORDER — HEPARIN SODIUM (PORCINE) 5000 UNIT/ML IJ SOLN
5000.0000 [IU] | Freq: Three times a day (TID) | INTRAMUSCULAR | Status: DC
  Administered 2012-12-24 – 2012-12-25 (×3): 5000 [IU] via SUBCUTANEOUS
  Filled 2012-12-23 (×6): qty 1

## 2012-12-23 NOTE — ED Notes (Signed)
Checked patient cbg it was 89 notfied RN Kim of blood sugar

## 2012-12-23 NOTE — Procedures (Signed)
Central Venous Catheter Insertion Procedure Note [name] [name] [id number] 16-Oct-1945  Procedure: Insertion of Central Venous Catheter Indications: Assessment of intravascular volume and Drug and/or fluid administration  Procedure Details Consent: Risks of procedure as well as the alternatives and risks of each were explained to the (patient/caregiver).  Consent for procedure obtained. Time Out: Verified patient identification, verified procedure, site/side was marked, verified correct patient position, special equipment/implants available, medications/allergies/relevent history reviewed, required imaging and test results available.  Performed  Maximum sterile technique was used including antiseptics, cap, gloves, gown, hand hygiene, mask and sheet. Skin prep: Chlorhexidine; local anesthetic administered A antimicrobial bonded/coated triple lumen catheter was placed in the left internal jugular vein using the Seldinger technique.  Ultrasound was used to verify the patency of the vein and for real time needle guidance.  Evaluation Blood flow good Complications: No apparent complications Patient did tolerate procedure well. Chest X-ray ordered to verify placement.  CXR: normal.  MCQUAID, DOUGLAS 01-11-2013, 10:35 PM

## 2012-12-23 NOTE — ED Notes (Signed)
Left voice mail on husbands cell phone to call Selena Batten, RN regarding the patient.

## 2012-12-23 NOTE — H&P (Signed)
PULMONARY  / CRITICAL CARE MEDICINE  Name: [name] [name] MRN: [id number] DOB: 03-Apr-1946    ADMISSION DATE:  12/19/2012 CONSULTATION DATE:  12/19/2012  REFERRING MD :  EDP PRIMARY SERVICE: PCCM  CHIEF COMPLAINT:  Septic shock  BRIEF PATIENT DESCRIPTION: 67 y/o female with alcoholic cirrhosis was admitted on 6/18 through the [facility] ED for septic shock in the setting of a recent diagnosis of a "bloodstream infection".    SIGNIFICANT EVENTS / STUDIES:  6/18 admission >>  LINES / TUBES: 6/17 R arm PICC >>  CULTURES: 6/16 blood >> GPC in clusters/pairs ([facility]) 6/18 blood >> 6/18 urine >>  ANTIBIOTICS: 6/14 bactrim >> 6/18 [facility]) 6/17 Cipro >> 6/18 [facility]  6/18 vanc >> 6/18 imipenem >>  HISTORY OF PRESENT ILLNESS:  67 y/o female with alcoholic cirrhosis was admitted on 6/18 through the [facility] ED for septic shock in the setting of a recent diagnosis of a "bloodstream infection". She was admitted to [facility] with septic shock and respiratory failure from a small bowel perforation from 5/7 through 5/29.  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  She was discharged to [facility] for wound care, physical therapy, and speech therapy. For the two weeks prior to this admission she has experienced some difficulty sleeping, mild confusion, and worsening jaundice.  The family is unaware of whether or not she has had a fever, but apparently blood cultures were collected on 6/16 and are positive for GPC's in pairs and clusters.  She was also believed to have a UTI.  She was started on Septra for a UTI on 6/14 and this was stopped on 6/17.  A PICC was placed on 6/17 and cipro IV was started.  She went to her surgeons office for a wound check on 6/18 and had a normal blood pressure.  After she returned to [facility] she was told to go to the ED because she had "low calcium that needed to be  treated in the hospital".  In the [facility] ED she was found to be notably hypotensive.   She is currently alert and oriented and denies pain or discomfort.  She notes that her abdomen and R leg have been swelling over the last two weeks.  No dysuria, no cough, no dyspnea.  PAST MEDICAL HISTORY :  Past Medical History  Diagnosis Date  . Hypertension   . H/O brain surgery   . Aneurysm of anterior cerebral artery   . Hernia   . Anxiety   . Stroke   . Anemia   . Arthritis    Past Surgical History  Procedure Laterality Date  . Laparotomy  01/22/2012    Procedure: EXPLORATORY LAPAROTOMY;  Surgeon: Emelia Loron, MD;  Location: [facility] OR;  Service: General;  Laterality: N/A;  lysis of adhesions  . Hernia repair    . Esophagogastroduodenoscopy N/A 09/19/2012    Procedure: ESOPHAGOGASTRODUODENOSCOPY (EGD);  Surgeon: Charna Glendola, MD;  Location: [facility] ENDOSCOPY;  Service: Endoscopy;  Laterality: N/A;  . Colonoscopy N/A 09/20/2012    Procedure: COLONOSCOPY;  Surgeon: Charna Jaslyne, MD;  Location: [facility] ENDOSCOPY;  Service: Endoscopy;  Laterality: N/A;  . Givens capsule study N/A 09/21/2012    Procedure: GIVENS CAPSULE STUDY;  Surgeon: Charna Raylyn, MD;  Location: [facility] ENDOSCOPY;  Service: Endoscopy;  Laterality: N/A;  . Enteroscopy N/A 09/28/2012    Procedure: ENTEROSCOPY;  Surgeon: Rachael Fee, MD;  Location: [facility] ENDOSCOPY;  Service: Endoscopy;  Laterality: N/A;  . Upper gastrointestinal endoscopy  10/05/12  . Enteroscopy N/A 10/05/2012    Procedure: ENTEROSCOPY;  Surgeon: Meryl Dare, MD;  Location: [facility] ENDOSCOPY;  Service: Endoscopy;  Laterality: N/A;  . Laparotomy N/A 11/12/2012    Procedure: EXPLORATORY LAPAROTOMY  with repair perforated small bowel, abdominal wall debridement, application of wound vac dressing;  Surgeon: Ernestene Mention, MD;  Location: [facility] ORS;  Service: General;  Laterality: N/A;  . Wound debridement  11/14/2012    Procedure: Washout Abdominal Wound, debridement of Fascia, Partial Closure;   Surgeon: Ardeth Sportsman, MD;  Location: [facility] ORS;  Service: General;;   Prior to Admission medications   Medication Sig Start Date End Date Taking? Authorizing Provider  albuterol (PROVENTIL) (5 MG/ML) 0.5% nebulizer solution Take 0.5 mLs (2.5 mg total) by nebulization 3 (three) times daily. 12/03/12   Joseph Art, DO  antiseptic oral rinse (BIOTENE) LIQD 15 mLs by Mouth Rinse route every 4 (four) hours. 12/03/12   Joseph Art, DO  benazepril (LOTENSIN) 20 MG tablet  11/10/12   Historical Provider, MD  bisacodyl (DULCOLAX) 10 MG suppository Place 1 suppository (10 mg total) rectally every 12 (twelve) hours as needed. 12/03/12   Joseph Art, DO  cholestyramine light (PREVALITE) 4 G packet Take 1 packet (4 g total) by mouth 2 (two) times daily. 12/03/12   Joseph Art, DO  ciprofloxacin (CIPRO) 400 MG/40ML SOLN Inject 40 mLs (400 mg total) into the vein every 12 (twelve) hours. 12/22/12 01/15/2013  Tiffany L Reed, DO  feeding supplement (RESOURCE BREEZE) LIQD Take 1 Container by mouth 2 (two) times daily between meals. 12/03/12   Joseph Art, DO  folic acid (FOLVITE) 1 MG tablet Take 1 tablet (1 mg total) by mouth daily. 12/03/12   Joseph Art, DO  furosemide (LASIX) 20 MG tablet Take 1 tablet (20 mg total) by mouth daily with breakfast. 12/04/12   Joseph Art, DO  insulin aspart (NOVOLOG) 100 UNIT/ML injection Inject 0-20 Units into the skin 4 (four) times daily -  before meals and at bedtime. 12/03/12   Joseph Art, DO  labetalol (NORMODYNE) 100 MG tablet  11/10/12   Historical Provider, MD  levothyroxine (SYNTHROID, LEVOTHROID) 25 MCG tablet Take 1 tablet (25 mcg total) by mouth daily. 12/03/12   Joseph Art, DO  lip balm (CARMEX) ointment Apply 1 application topically 2 (two) times daily. 12/03/12   Joseph Art, DO  Maltodextrin-Xanthan Gum (RESOURCE THICKENUP CLEAR) POWD As needed 12/03/12   Joseph Art, DO  Multiple Vitamin (MULTIVITAMIN WITH MINERALS) TABS Take 1 tablet by mouth  daily. 12/03/12   Joseph Art, DO  ondansetron (ZOFRAN) 4 MG tablet Take 4 mg by mouth every 8 (eight) hours as needed for nausea. 11/04/12   John L Molpus, MD  pantoprazole (PROTONIX) 40 MG tablet Take 1 tablet (40 mg total) by mouth daily. 12/03/12   Joseph Art, DO  QUEtiapine (SEROQUEL) 25 MG tablet Take 1 tablet (25 mg total) by mouth at bedtime. 12/03/12   Joseph Art, DO  thiamine 100 MG tablet Take 1 tablet (100 mg total) by mouth daily. 12/03/12   Joseph Art, DO   Allergies  Allergen Reactions  . Etomidate     Myoclonus side effect pronounced  . Cephalexin Rash  . Penicillins Rash    FAMILY HISTORY:  Family History  Problem Relation Age of Onset  . Cancer Father  pancreatic  . Cancer Maternal Grandmother     breast   SOCIAL HISTORY:  reports that she has never smoked. She has never used smokeless tobacco. She reports that she drinks about 12.0 ounces of alcohol per week. She reports that she does not use illicit drugs.  REVIEW OF SYSTEMS:   Gen: Denies fever, + chills, weight change, + fatigue, night sweats HEENT: Denies blurred vision, double vision, hearing loss, tinnitus, sinus congestion, rhinorrhea, sore throat, neck stiffness, dysphagia PULM: Denies shortness of breath, cough, sputum production, hemoptysis, wheezing CV: Denies chest pain, edema, orthopnea, paroxysmal nocturnal dyspnea, palpitations GI: Denies abdominal pain, nausea, vomiting, diarrhea, hematochezia, melena, constipation, change in bowel habits GU: Denies dysuria, hematuria, polyuria, oliguria, urethral discharge Endocrine: Denies hot or cold intolerance, polyuria, polyphagia or appetite change Derm: Denies rash, dry skin, scaling or peeling skin change Heme: Denies easy bruising, bleeding, bleeding gums Neuro: Denies headache, numbness, weakness, slurred speech, loss of memory or consciousness   SUBJECTIVE:   VITAL SIGNS: Temp:  [95.1 F (35.1 C)-96.4 F (35.8 C)] 95.1 F (35.1 C)  (06/18 1901) Pulse Rate:  [72-92] 91 (06/18 1901) Resp:  [17-20] 18 (06/18 1901) BP: (79-120)/(38-78) 97/53 mmHg (06/18 1901) SpO2:  [97 %-99 %] 97 % (06/18 1901) Weight:  [68.493 kg (151 lb)-68.5 kg (151 lb 0.2 oz)] 68.5 kg (151 lb 0.2 oz) (06/18 1700) HEMODYNAMICS:   VENTILATOR SETTINGS:   INTAKE / OUTPUT: Intake/Output   None     PHYSICAL EXAMINATION:  Gen: profoundly jaundiced, resting comfortably in bed HEENT: NCAT, Scleral icterus, PERRL, EOMi, OP clear, neck supple without masses PULM: Crackles L base, otherwise clear to auscultation CV: RRR, systolic murmur noted, no JVD AB: BS+, midline wound dressing c/d/i, distended, buldging flanks Ext: warm, massive  edema, no clubbing, no cyanosis Derm: R leg wound> very superficial and small skin laceration < 1cm long, no surrounding erythema or warmth Neuro: A&Ox4, CN II-XII intact, MAEW   LABS:  Recent Labs Lab 12/25/2012 1610  HGB 9.2*  WBC 12.9*  PLT 120*  NA 122*  K 5.1  CL 91*  CO2 18*  GLUCOSE 82  BUN 23  CREATININE 2.82*  CALCIUM 7.1*  AST 137*  ALT 18  ALKPHOS 117  BILITOT 15.7*  PROT 6.2  ALBUMIN 0.9*    Recent Labs Lab 12/09/2012 1632  GLUCAP 78    CXR: Low lung volumes, no clear infiltrate EKG : NSR, no ST wave changes  ASSESSMENT / PLAN:  PULMONARY A: no acute issues P:   -O2 as needed -monitor respiratory status closely  CARDIOVASCULAR A: Septic shock after receiving 2L crystalloid in ED, massive extravascular volume overload P:  -placed CVL -hold further volume resuscitation until CVL/CVP monitoring -sepsis protocol modified (levophed, no fluids) -tele -daily EKG -serial lactic acid  RENAL A:  AKI in setting of septic shock, worsening cirrhosis; hopefully just pre-renal, worrisome for hepatorenal considering profound worsening of liver function P:   -monitor UOP closely -daily BMET -hold further IVF until CVP measured -albumin if SBP  GASTROINTESTINAL A:  Aloholic  cirrhosis with markedly elevated bili, poor synthetic function (albumin 0.9); worrisome for worsening liver function due to infection ddx includes bactrim related as this can cause bilistassi; Picture very concerning for SBP P:   -RUQ ultrasound with doppler -fractionated bili -diagnostic paracentesis -Hepatology consult 6/19 (primary Gastroenterologist is Dr. Christella Hartigan)  HEMATOLOGIC A:  Thrombocytopenia, anemia likely all due to cirrhosis P:  -check peripheral smear for schistocytes -monitor for bleeding  INFECTIOUS A:  Septic shock due to UTI and very likely SBP causing bacteremia with GPC's in clusters and pairs P:   -diagnostic paracentesis -vanc/imi -continue to call [facility] [phone]) for 6/16 blood culture results -f/u cultures here  ENDOCRINE A:  DM2 P:   -ICU hyperglycemia protocol  NEUROLOGIC A:  Mild hepatic encephalopathy P:   -Lactulose titrated to three stools/day -minimize sedating meds  TODAY'S SUMMARY:   I have personally obtained a history, examined the patient, evaluated laboratory and imaging results, formulated the assessment and plan and placed orders. CRITICAL CARE: The patient is critically ill with multiple organ systems failure and requires high complexity decision making for assessment and support, frequent evaluation and titration of therapies, application of advanced monitoring technologies and extensive interpretation of multiple databases. Critical Care Time devoted to patient care services described in this note is  70 minutes.   Fonnie Jarvis Pulmonary and Critical Care Medicine [facility] Pager: [phone]  12/17/2012, 7:49 PM

## 2012-12-23 NOTE — Progress Notes (Signed)
CRITICAL VALUE ALERT  Critical value received:  INR 5.02  Date of notification:  01-22-13  Time of notification:  2215  Critical value read back:yes  Nurse who received alert:  Burnard Leigh  MD notified (1st page):  Dr. Kendrick Fries  Time of first page:  2215  MD notified (2nd page):  Time of second page:  Responding MD: Dr. Kendrick Fries  Time MD responded:  2215

## 2012-12-23 NOTE — Telephone Encounter (Signed)
Patient spouse calling into office to schedule follow up appointment.  Patient scheduled appointment for 12/22/12 @ 1:30 pm w/Dr. Derrell Lolling.

## 2012-12-23 NOTE — ED Provider Notes (Signed)
History     CSN: [id number]  Arrival date & time 12/21/2012  1538   First MD Initiated Contact with Patient 12/17/2012 1542      Chief Complaint  Patient presents with  . Labs Only    (Consider location/radiation/quality/duration/timing/severity/associated sxs/prior treatment) HPI Comments: Patient presents to the emergency department with chief complaint of weakness. Patient states that she was seen earlier today by [facility] for a postop visit. She was sent to the emergency Department do to low blood pressure. Patient has also been jaundiced. She states that she's been feeling increasingly weak. She denies pain. States that her abdomen feels larger than normal, and that her skin looks more yellow than normal. Patient denies headache, blurred vision, new hearing loss, sore throat, chest pain, shortness of breath, nausea, vomiting, diarrhea, constipation, dysuria, peripheral edema, back pain, numbness or tingling of the extremities.  Additionally, patient recently underwent emergent laparotomy for jejunal perforation on 11/12/2012. She had a long and complex hospital stay.  The history is provided by the patient. No language interpreter was used.    Past Medical History  Diagnosis Date  . Hypertension   . H/O brain surgery   . Aneurysm of anterior cerebral artery   . Hernia   . Anxiety   . Stroke   . Anemia   . Arthritis     Past Surgical History  Procedure Laterality Date  . Laparotomy  01/22/2012    Procedure: EXPLORATORY LAPAROTOMY;  Surgeon: Emelia Loron, MD;  Location: [facility] OR;  Service: General;  Laterality: N/A;  lysis of adhesions  . Hernia repair    . Esophagogastroduodenoscopy N/A 09/19/2012    Procedure: ESOPHAGOGASTRODUODENOSCOPY (EGD);  Surgeon: Charna Kimberle, MD;  Location: [facility] ENDOSCOPY;  Service: Endoscopy;  Laterality: N/A;  . Colonoscopy N/A 09/20/2012    Procedure: COLONOSCOPY;  Surgeon: Charna Ajla, MD;  Location: [facility] ENDOSCOPY;  Service: Endoscopy;   Laterality: N/A;  . Givens capsule study N/A 09/21/2012    Procedure: GIVENS CAPSULE STUDY;  Surgeon: Charna Doriana, MD;  Location: [facility] ENDOSCOPY;  Service: Endoscopy;  Laterality: N/A;  . Enteroscopy N/A 09/28/2012    Procedure: ENTEROSCOPY;  Surgeon: Rachael Fee, MD;  Location: [facility] ENDOSCOPY;  Service: Endoscopy;  Laterality: N/A;  . Upper gastrointestinal endoscopy  10/05/12  . Enteroscopy N/A 10/05/2012    Procedure: ENTEROSCOPY;  Surgeon: Meryl Dare, MD;  Location: [facility] ENDOSCOPY;  Service: Endoscopy;  Laterality: N/A;  . Laparotomy N/A 11/12/2012    Procedure: EXPLORATORY LAPAROTOMY  with repair perforated small bowel, abdominal wall debridement, application of wound vac dressing;  Surgeon: Ernestene Mention, MD;  Location: [facility] ORS;  Service: General;  Laterality: N/A;  . Wound debridement  11/14/2012    Procedure: Washout Abdominal Wound, debridement of Fascia, Partial Closure;  Surgeon: Ardeth Sportsman, MD;  Location: [facility] ORS;  Service: General;;    Family History  Problem Relation Age of Onset  . Cancer Father     pancreatic  . Cancer Maternal Grandmother     breast    History  Substance Use Topics  . Smoking status: Never Smoker   . Smokeless tobacco: Never Used  . Alcohol Use: 12.0 oz/week    20 Glasses of wine per week     Comment: daily 5  glasses wine day     OB History   Grav Para Term Preterm Abortions TAB SAB Ect Mult Living  Review of Systems  All other systems reviewed and are negative.    Allergies  Etomidate; Cephalexin; and Penicillins  Home Medications   Current Outpatient Rx  Name  Route  Sig  Dispense  Refill  . albuterol (PROVENTIL) (5 MG/ML) 0.5% nebulizer solution   Nebulization   Take 0.5 mLs (2.5 mg total) by nebulization 3 (three) times daily.   20 mL   12   . antiseptic oral rinse (BIOTENE) LIQD   Mouth Rinse   15 mLs by Mouth Rinse route every 4 (four) hours.         . benazepril (LOTENSIN) 20 MG tablet                . bisacodyl (DULCOLAX) 10 MG suppository   Rectal   Place 1 suppository (10 mg total) rectally every 12 (twelve) hours as needed.   12 suppository   0   . cholestyramine light (PREVALITE) 4 G packet   Oral   Take 1 packet (4 g total) by mouth 2 (two) times daily.         . ciprofloxacin (CIPRO) 400 MG/40ML SOLN   Intravenous   Inject 40 mLs (400 mg total) into the vein every 12 (twelve) hours.   280 mL      . feeding supplement (RESOURCE BREEZE) LIQD   Oral   Take 1 Container by mouth 2 (two) times daily between meals.      0   . folic acid (FOLVITE) 1 MG tablet   Oral   Take 1 tablet (1 mg total) by mouth daily.         . furosemide (LASIX) 20 MG tablet   Oral   Take 1 tablet (20 mg total) by mouth daily with breakfast.   30 tablet      . insulin aspart (NOVOLOG) 100 UNIT/ML injection   Subcutaneous   Inject 0-20 Units into the skin 4 (four) times daily -  before meals and at bedtime.   1 vial   12   . labetalol (NORMODYNE) 100 MG tablet               . levothyroxine (SYNTHROID, LEVOTHROID) 25 MCG tablet   Oral   Take 1 tablet (25 mcg total) by mouth daily.         Marland Kitchen lip balm (CARMEX) ointment   Topical   Apply 1 application topically 2 (two) times daily.   7 g   0   . Maltodextrin-Xanthan Gum (RESOURCE THICKENUP CLEAR) POWD      As needed         . Multiple Vitamin (MULTIVITAMIN WITH MINERALS) TABS   Oral   Take 1 tablet by mouth daily.         . ondansetron (ZOFRAN) 4 MG tablet   Oral   Take 4 mg by mouth every 8 (eight) hours as needed for nausea.         . pantoprazole (PROTONIX) 40 MG tablet   Oral   Take 1 tablet (40 mg total) by mouth daily.         . QUEtiapine (SEROQUEL) 25 MG tablet   Oral   Take 1 tablet (25 mg total) by mouth at bedtime.         . thiamine 100 MG tablet   Oral   Take 1 tablet (100 mg total) by mouth daily.           BP 79/48  Pulse 92  Temp(Src) 96.4  F (35.8 C) (Axillary)   Resp 20  SpO2 99%  Physical Exam  Nursing note and vitals reviewed. Constitutional: She is oriented to person, place, and time. She appears well-developed and well-nourished.  HENT:  Head: Normocephalic and atraumatic.  Eyes: EOM are normal. Pupils are equal, round, and reactive to light. Scleral icterus is present.  Neck: Normal range of motion. Neck supple.  Cardiovascular: Normal rate and regular rhythm.  Exam reveals no gallop and no friction rub.   No murmur heard. Pulmonary/Chest: Effort normal and breath sounds normal. No respiratory distress. She has no wheezes. She has no rales. She exhibits no tenderness.  Abdominal: Soft. Bowel sounds are normal. She exhibits distension. She exhibits no mass. There is no tenderness. There is no rebound and no guarding.  Midline surgical incision with well-healing granulation tissue, no active discharge, probable ascites  Musculoskeletal: Normal range of motion. She exhibits no edema and no tenderness.  Neurological: She is alert and oriented to person, place, and time.  Skin: Skin is warm and dry.  Very jaundiced  Psychiatric: She has a normal mood and affect. Her behavior is normal. Judgment and thought content normal.    ED Course  Procedures (including critical care time)  Labs Reviewed  CBC WITH DIFFERENTIAL  COMPREHENSIVE METABOLIC PANEL  AMMONIA  LIPASE, BLOOD  URINALYSIS, ROUTINE W REFLEX MICROSCOPIC   Ir Fluoro Guide Cv Line Right  12/22/2012   *RADIOLOGY REPORT*  Clinical Data: Bacteremia and need for IV antibiotic therapy.  PICC LINE PLACEMENT WITH ULTRASOUND AND FLUOROSCOPIC  GUIDANCE  Fluoroscopy Time: 6 seconds.  The right arm was prepped with chlorhexidine, draped in the usual sterile fashion using maximum barrier technique (cap and mask, sterile gown, sterile gloves, large sterile sheet, hand hygiene and cutaneous antisepsis) and infiltrated locally with 1% Lidocaine.  Ultrasound demonstrated patency of the right brachial  vein, and this was documented with an image.  Under real-time ultrasound guidance, this vein was accessed with a 21 gauge micropuncture needle and image documentation was performed.  The needle was exchanged over a guidewire for a peel-away sheath through which a 5 [place] dual lumen PICC trimmed to 37 cm was advanced, positioned with its tip at the lower SVC/right atrial junction.  Fluoroscopy during the procedure and fluoro spot radiograph confirms appropriate catheter position.  The catheter was flushed, secured to the skin with Prolene sutures, and covered with a sterile dressing.  Complications:  None  IMPRESSION: Successful right arm PICC line placement with ultrasound and fluoroscopic guidance.  The catheter is ready for use.   Original Report Authenticated By: Irish Lack, M.D.   Ir US Guide Vasc Access Right  12/22/2012   *RADIOLOGY REPORT*  Clinical Data: Bacteremia and need for IV antibiotic therapy.  PICC LINE PLACEMENT WITH ULTRASOUND AND FLUOROSCOPIC  GUIDANCE  Fluoroscopy Time: 6 seconds.  The right arm was prepped with chlorhexidine, draped in the usual sterile fashion using maximum barrier technique (cap and mask, sterile gown, sterile gloves, large sterile sheet, hand hygiene and cutaneous antisepsis) and infiltrated locally with 1% Lidocaine.  Ultrasound demonstrated patency of the right brachial vein, and this was documented with an image.  Under real-time ultrasound guidance, this vein was accessed with a 21 gauge micropuncture needle and image documentation was performed.  The needle was exchanged over a guidewire for a peel-away sheath through which a 5 [place] dual lumen PICC trimmed to 37 cm was advanced, positioned with its tip at the lower SVC/right atrial junction.  Fluoroscopy  during the procedure and fluoro spot radiograph confirms appropriate catheter position.  The catheter was flushed, secured to the skin with Prolene sutures, and covered with a sterile dressing.  Complications:   None  IMPRESSION: Successful right arm PICC line placement with ultrasound and fluoroscopic guidance.  The catheter is ready for use.   Original Report Authenticated By: Irish Lack, M.D.    CRITICAL CARE Performed by: Roxy Horseman   Total critical care time: 31  Critical care time was exclusive of separately billable procedures and treating other patients.  Critical care was necessary to treat or prevent imminent or life-threatening deterioration.  Critical care was time spent personally by me on the following activities: development of treatment plan with patient and/or surrogate as well as nursing, discussions with consultants, evaluation of patient's response to treatment, examination of patient, obtaining history from patient or surrogate, ordering and performing treatments and interventions, ordering and review of laboratory studies, ordering and review of radiographic studies, pulse oximetry and re-evaluation of patient's condition.   1. Hypotension   2. 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 Recently admitted for jejunal perforation with emergent laparotomy. Patient states that she has progressively been feeling more weak. States that her skin is turning more yellow.  4:20 PM Patient seen by and discussed with Dr. Adriana Simas.  Will order labs, and admit.  Will consult critical care 2/2 hypotension.  5:59 PM Critical care will admit the patient. We'll continue giving fluids, additionally will give vancomycin and Zosyn.  6:12 PM Primaxin substituted for zosyn per pharmacy recommendation.    Roxy Horseman, PA-C 27-Dec-2012 1801  Roxy Horseman, PA-C 12-27-2012 1812  Roxy Horseman, PA-C December 27, 2012 [phone]

## 2012-12-23 NOTE — ED Notes (Signed)
Pt states she is not able to urinate at this time. 

## 2012-12-23 NOTE — Progress Notes (Signed)
Patient ID: [name] [name], female   DOB: [birth year], 67 y.o.   MRN: [id number] History: This patient underwent emergent laparotomy and closure of jejunal perforation on 11/12/2012. She has alcoholic liver disease, morbid obesity. 2 days later she underwent primary closure her fascia. She had a long, stormy hospital course. She is now living at [facility]. She is here with her husband. Details of her medical condition there are not here but she clearly has hepatic failure with jaundice, severe protein calorie malnutrition, encephalopathy, and nonhealing abdominal wound. There is a negative pressure dressing in place. I do not know how much it has been draining. I suspect she has ascites.  Exam: Patient is deeply jaundiced. She is awake and pleasant disoriented to place, date, situation. She has limited insight. She is somewhat encephalopathic. She is in no distress. Abdomen is obese. Soft. Nontender. I removed the VAC to look at the wound. There is a central area of dry necrosis. I removed a couple of sutures that were loose. The upper and lower ends of the incision had good granulation tissue. There is no enteric drainage. She has not had any profuse drainage. I redressed the wound with saline and kerlex. I felt that debridement of the wound is contraindicated due to the risk of injury to the intestine and ascites.  Assessment jejunal perforation with diffuse peritonitis, status post laparotomy and primary repair, subsequent fascial closure. Severe protein calorie malnutrition Hepatic failure with jaundice Encephalopathy Guarded prognosis Nonhealing midline wound  Plan: I discussed her care with the husband who seemed a little frustrated. I made recommendations regarding conversation with the managing physicians regarding her diagnoses and their prognosis. He states he will do that Negative pressure dressing, continue to change 3 times a week Return to see me in 3 weeks.   Angelia Mould.  Derrell Lolling, M.D., [facility] General and Minimally invasive Surgery Breast and Colorectal Surgery Office:   [phone] Pager:   [phone]

## 2012-12-23 NOTE — ED Provider Notes (Signed)
ED ECG REPORT  I personally interpreted this EKG   Date: 28-Dec-2012   Rate: 91  Rhythm: normal sinus rhythm  QRS Axis: normal  Intervals: normal  ST/T Wave abnormalities: normal  Conduction Disutrbances:none  Narrative Interpretation:   Old EKG Reviewed: none available    Roxy Horseman, PA-C 12-28-12 [phone]

## 2012-12-23 NOTE — ED Notes (Addendum)
Pt from home. Had emergent laparoscopy at [facility] 6 weeks ago. Was at [facility] for follow up for liver failure. MD concerned that pt's ammonia level was up. Sent her to the ER to check ammonia level and blood sugar.   EMS called to transport pt to the ER for blood work post surgery. Pt has history of ETOH induced liver failure. Pt alert and oriented x 3, neuro intact. Pt denies post op pain. Pt admits to being confused for past couple of days.

## 2012-12-23 NOTE — Patient Instructions (Signed)
The abdominal wound is relatively clean without any evidence of infection. There is a central area where the muscle has not healed well. Because of her liver failure and malnutrition this wound may never completely heal.  Continue the negative pressure dressing, and the nurses should change that to 3 times a week.  Return to see Dr. Derrell Lolling in 3 weeks.

## 2012-12-23 NOTE — Progress Notes (Signed)
Unit CM UR Completed by [facility] ED CM  W. Ashelynn Marks RN  

## 2012-12-23 NOTE — ED Notes (Signed)
Patient transported to X-ray 

## 2012-12-23 NOTE — Consult Note (Signed)
ANTIBIOTIC CONSULT NOTE - INITIAL  Pharmacy Consult for Vancomycin + Primaxin Indication: ascites  Allergies  Allergen Reactions  . Etomidate     Myoclonus side effect pronounced  . Cephalexin Rash  . Penicillins Rash    Patient Measurements: Height: 5' 4.96" (165 cm) Weight: 151 lb 0.2 oz (68.5 kg) IBW/kg (Calculated) : 56.91  Vital Signs: Temp: 96.4 F (35.8 C) (06/18 1551) Temp src: Axillary (06/18 1551) BP: 81/38 mmHg (06/18 1630) Pulse Rate: 90 (06/18 1630) Intake/Output from previous day:   Intake/Output from this shift:    Labs:  Recent Labs  12/27/2012 1610  WBC 12.9*  HGB 9.2*  PLT 120*  CREATININE 2.82*   Estimated Creatinine Clearance: 19.6 ml/min (by C-G formula based on Cr of 2.82).  Microbiology: Recent Results (from the past 720 hour(s))  CLOSTRIDIUM DIFFICILE BY PCR     Status: None   Collection Time    11/27/12  3:20 AM      Result Value Range Status   C difficile by pcr NEGATIVE  NEGATIVE Final    Medical History: Past Medical History  Diagnosis Date  . Hypertension   . H/O brain surgery   . Aneurysm of anterior cerebral artery   . Hernia   . Anxiety   . Stroke   . Anemia   . Arthritis    Assessment: 66yof with history of ETOH abuse s/p emergent laparotomy for jejunal perforation 11/12/12 was seen at [facility] today for post-op follow up. She was found to be hypotensive, jaundiced, and stated that her abdomen felt larger. She is also in ARF with sCr of 2.82 and CrCl ~ 16ml/min. She will begin empiric antibiotics for probable ascites.  Zosyn originally ordered but noted allergy to PCN and cephalexin. Spoke to [employer], PA-C and he is ok with switching to Primaxin. Monitor closely for reaction.  Goal of Therapy:  Vancomycin trough level 15-20 mcg/ml Appropriate primaxin dosing  Plan:  1) Vancomycin 1g IV q48 2) Primaxin 250mg  IV q12 3) Follow renal function, cultures, trough at steady state  Fredrik Rigger 12-27-2012,6:10 PM

## 2012-12-23 NOTE — Consult Note (Signed)
Reason for Consult:Sepsis; abdominal pain Referring Physician: [name] is an 67 y.o. female.  HPI: This is a 67 yo female with a very complicated medical/ surgical history. She has alcoholic cirrhosis and is s/p emergent laparotomy on 11/12/12 for jejunal perforation with extensive lysis of adhesions, repair of SB perforation, VAC.  Discharged to SNF on 12/03/12.  Was seen by Dr. Derrell Lolling in the office today - noted to be jaundiced, encephalopathic, poorly healing abdominal wound with ascites.  Later in the afternoon, she was found to be hypotensive and was brought to the ED.  She was recently treated for UTI.  She complains mostly of abdominal and R leg swelling.  Admitted by CCM for resuscitation.    Past Medical History  Diagnosis Date  . Hypertension   . H/O brain surgery   . Aneurysm of anterior cerebral artery   . Hernia   . Anxiety   . Stroke   . Anemia   . Arthritis   . DM2 (diabetes mellitus, type 2) 12/25/2012    Past Surgical History  Procedure Laterality Date  . Laparotomy  01/22/2012    Procedure: EXPLORATORY LAPAROTOMY;  Surgeon: Emelia Loron, MD;  Location: [facility] OR;  Service: General;  Laterality: N/A;  lysis of adhesions  . Hernia repair    . Esophagogastroduodenoscopy N/A 09/19/2012    Procedure: ESOPHAGOGASTRODUODENOSCOPY (EGD);  Surgeon: Charna Shivani, MD;  Location: [facility] ENDOSCOPY;  Service: Endoscopy;  Laterality: N/A;  . Colonoscopy N/A 09/20/2012    Procedure: COLONOSCOPY;  Surgeon: Charna Armenia, MD;  Location: [facility] ENDOSCOPY;  Service: Endoscopy;  Laterality: N/A;  . Givens capsule study N/A 09/21/2012    Procedure: GIVENS CAPSULE STUDY;  Surgeon: Charna Eymi, MD;  Location: [facility] ENDOSCOPY;  Service: Endoscopy;  Laterality: N/A;  . Enteroscopy N/A 09/28/2012    Procedure: ENTEROSCOPY;  Surgeon: Rachael Fee, MD;  Location: [facility] ENDOSCOPY;  Service: Endoscopy;  Laterality: N/A;  . Upper gastrointestinal endoscopy  10/05/12  . Enteroscopy N/A 10/05/2012     Procedure: ENTEROSCOPY;  Surgeon: Meryl Dare, MD;  Location: [facility] ENDOSCOPY;  Service: Endoscopy;  Laterality: N/A;  . Laparotomy N/A 11/12/2012    Procedure: EXPLORATORY LAPAROTOMY  with repair perforated small bowel, abdominal wall debridement, application of wound vac dressing;  Surgeon: Ernestene Mention, MD;  Location: [facility] ORS;  Service: General;  Laterality: N/A;  . Wound debridement  11/14/2012    Procedure: Washout Abdominal Wound, debridement of Fascia, Partial Closure;  Surgeon: Ardeth Sportsman, MD;  Location: [facility] ORS;  Service: General;;    Family History  Problem Relation Age of Onset  . Cancer Father     pancreatic  . Cancer Maternal Grandmother     breast    Social History:  reports that she has never smoked. She has never used smokeless tobacco. She reports that she drinks about 12.0 ounces of alcohol per week. She reports that she does not use illicit drugs.  Allergies:  Allergies  Allergen Reactions  . Etomidate     Myoclonus side effect pronounced  . Cephalexin Rash  . Penicillins Rash    Medications:  Prior to Admission medications   Medication Sig Start Date End Date Taking? Authorizing Provider  albuterol (PROVENTIL) (5 MG/ML) 0.5% nebulizer solution Take 0.5 mLs (2.5 mg total) by nebulization 3 (three) times daily. 12/03/12   Joseph Art, DO  antiseptic oral rinse (BIOTENE) LIQD 15 mLs by Mouth Rinse route every 4 (four) hours. 12/03/12   Selinda Orion  Vann, DO  benazepril (LOTENSIN) 20 MG tablet  11/10/12   Historical Provider, MD  bisacodyl (DULCOLAX) 10 MG suppository Place 1 suppository (10 mg total) rectally every 12 (twelve) hours as needed. 12/03/12   Joseph Art, DO  cholestyramine light (PREVALITE) 4 G packet Take 1 packet (4 g total) by mouth 2 (two) times daily. 12/03/12   Joseph Art, DO  ciprofloxacin (CIPRO) 400 MG/40ML SOLN Inject 40 mLs (400 mg total) into the vein every 12 (twelve) hours. 12/22/12 01/15/2013  Tiffany L Reed, DO  feeding supplement  (RESOURCE BREEZE) LIQD Take 1 Container by mouth 2 (two) times daily between meals. 12/03/12   Joseph Art, DO  folic acid (FOLVITE) 1 MG tablet Take 1 tablet (1 mg total) by mouth daily. 12/03/12   Joseph Art, DO  furosemide (LASIX) 20 MG tablet Take 1 tablet (20 mg total) by mouth daily with breakfast. 12/04/12   Joseph Art, DO  insulin aspart (NOVOLOG) 100 UNIT/ML injection Inject 0-20 Units into the skin 4 (four) times daily -  before meals and at bedtime. 12/03/12   Joseph Art, DO  labetalol (NORMODYNE) 100 MG tablet  11/10/12   Historical Provider, MD  levothyroxine (SYNTHROID, LEVOTHROID) 25 MCG tablet Take 1 tablet (25 mcg total) by mouth daily. 12/03/12   Joseph Art, DO  lip balm (CARMEX) ointment Apply 1 application topically 2 (two) times daily. 12/03/12   Joseph Art, DO  Maltodextrin-Xanthan Gum (RESOURCE THICKENUP CLEAR) POWD As needed 12/03/12   Joseph Art, DO  Multiple Vitamin (MULTIVITAMIN WITH MINERALS) TABS Take 1 tablet by mouth daily. 12/03/12   Joseph Art, DO  ondansetron (ZOFRAN) 4 MG tablet Take 4 mg by mouth every 8 (eight) hours as needed for nausea. 11/04/12   John L Molpus, MD  pantoprazole (PROTONIX) 40 MG tablet Take 1 tablet (40 mg total) by mouth daily. 12/03/12   Joseph Art, DO  QUEtiapine (SEROQUEL) 25 MG tablet Take 1 tablet (25 mg total) by mouth at bedtime. 12/03/12   Joseph Art, DO  thiamine 100 MG tablet Take 1 tablet (100 mg total) by mouth daily. 12/03/12   Joseph Art, DO    Prior to Admission:  Prescriptions prior to admission  Medication Sig Dispense Refill  . albuterol (PROVENTIL) (5 MG/ML) 0.5% nebulizer solution Take 0.5 mLs (2.5 mg total) by nebulization 3 (three) times daily.  20 mL  12  . antiseptic oral rinse (BIOTENE) LIQD 15 mLs by Mouth Rinse route every 4 (four) hours.      . benazepril (LOTENSIN) 20 MG tablet       . bisacodyl (DULCOLAX) 10 MG suppository Place 1 suppository (10 mg total) rectally every 12  (twelve) hours as needed.  12 suppository  0  . cholestyramine light (PREVALITE) 4 G packet Take 1 packet (4 g total) by mouth 2 (two) times daily.      . ciprofloxacin (CIPRO) 400 MG/40ML SOLN Inject 40 mLs (400 mg total) into the vein every 12 (twelve) hours.  280 mL    . feeding supplement (RESOURCE BREEZE) LIQD Take 1 Container by mouth 2 (two) times daily between meals.    0  . folic acid (FOLVITE) 1 MG tablet Take 1 tablet (1 mg total) by mouth daily.      . furosemide (LASIX) 20 MG tablet Take 1 tablet (20 mg total) by mouth daily with breakfast.  30 tablet    . insulin aspart (  NOVOLOG) 100 UNIT/ML injection Inject 0-20 Units into the skin 4 (four) times daily -  before meals and at bedtime.  1 vial  12  . labetalol (NORMODYNE) 100 MG tablet       . levothyroxine (SYNTHROID, LEVOTHROID) 25 MCG tablet Take 1 tablet (25 mcg total) by mouth daily.      Marland Kitchen lip balm (CARMEX) ointment Apply 1 application topically 2 (two) times daily.  7 g  0  . Maltodextrin-Xanthan Gum (RESOURCE THICKENUP CLEAR) POWD As needed      . Multiple Vitamin (MULTIVITAMIN WITH MINERALS) TABS Take 1 tablet by mouth daily.      . ondansetron (ZOFRAN) 4 MG tablet Take 4 mg by mouth every 8 (eight) hours as needed for nausea.      . pantoprazole (PROTONIX) 40 MG tablet Take 1 tablet (40 mg total) by mouth daily.      . QUEtiapine (SEROQUEL) 25 MG tablet Take 1 tablet (25 mg total) by mouth at bedtime.      . thiamine 100 MG tablet Take 1 tablet (100 mg total) by mouth daily.        Results for orders placed during the hospital encounter of 18-Jan-2013 (from the past 48 hour(s))  CBC WITH DIFFERENTIAL     Status: Abnormal   Collection Time    18-Jan-2013  4:10 PM      Result Value Range   WBC 12.9 (*) 4.0 - 10.5 K/uL   RBC 2.93 (*) 3.87 - 5.11 MIL/uL   Hemoglobin 9.2 (*) 12.0 - 15.0 g/dL   HCT 30.8 (*) 65.7 - 84.6 %   MCV 93.5  78.0 - 100.0 fL   MCH 31.4  26.0 - 34.0 pg   MCHC 33.6  30.0 - 36.0 g/dL   RDW 96.2 (*) 95.2 -  15.5 %   Platelets 120 (*) 150 - 400 K/uL   Neutrophils Relative % 86 (*) 43 - 77 %   Lymphocytes Relative 6 (*) 12 - 46 %   Monocytes Relative 7  3 - 12 %   Eosinophils Relative 1  0 - 5 %   Basophils Relative 0  0 - 1 %   Neutro Abs 11.1 (*) 1.7 - 7.7 K/uL   Lymphs Abs 0.8  0.7 - 4.0 K/uL   Monocytes Absolute 0.9  0.1 - 1.0 K/uL   Eosinophils Absolute 0.1  0.0 - 0.7 K/uL   Basophils Absolute 0.0  0.0 - 0.1 K/uL   WBC Morphology TOXIC GRANULATION    COMPREHENSIVE METABOLIC PANEL     Status: Abnormal   Collection Time    January 18, 2013  4:10 PM      Result Value Range   Sodium 122 (*) 135 - 145 mEq/L   Potassium 5.1  3.5 - 5.1 mEq/L   Comment: HEMOLYSIS AT THIS LEVEL MAY AFFECT RESULT   Chloride 91 (*) 96 - 112 mEq/L   CO2 18 (*) 19 - 32 mEq/L   Glucose, Bld 82  70 - 99 mg/dL   BUN 23  6 - 23 mg/dL   Creatinine, Ser 8.41 (*) 0.50 - 1.10 mg/dL   Comment: ICTERUS AT THIS LEVEL MAY AFFECT RESULT   Calcium 7.1 (*) 8.4 - 10.5 mg/dL   Total Protein 6.2  6.0 - 8.3 g/dL   Albumin 0.9 (*) 3.5 - 5.2 g/dL   AST 324 (*) 0 - 37 U/L   ALT 18  0 - 35 U/L   Alkaline Phosphatase 117  39 -  117 U/L   Total Bilirubin 15.7 (*) 0.3 - 1.2 mg/dL   GFR calc non Af Amer 17 (*) >90 mL/min   GFR calc Af Amer 19 (*) >90 mL/min   Comment:            The eGFR has been calculated     using the CKD EPI equation.     This calculation has not been     validated in all clinical     situations.     eGFR's persistently     <90 mL/min signify     possible Chronic Kidney Disease.  LIPASE, BLOOD     Status: None   Collection Time    12/14/2012  4:10 PM      Result Value Range   Lipase 33  11 - 59 U/L  BILIRUBIN, FRACTIONATED(TOT/DIR/INDIR)     Status: Abnormal   Collection Time    12/11/2012  4:10 PM      Result Value Range   Total Bilirubin 15.7 (*) 0.3 - 1.2 mg/dL   Comment: ICTERUS AT THIS LEVEL MAY AFFECT RESULT   Bilirubin, Direct 9.5 (*) 0.0 - 0.3 mg/dL   Comment: HEMOLYSIS AT THIS LEVEL MAY AFFECT RESULT    Indirect Bilirubin 6.2 (*) 0.3 - 0.9 mg/dL  AMMONIA     Status: Abnormal   Collection Time    12/26/2012  4:11 PM      Result Value Range   Ammonia 64 (*) 11 - 60 umol/L   Comment: ICTERUS AT THIS LEVEL MAY AFFECT RESULT  GLUCOSE, CAPILLARY     Status: None   Collection Time    12/25/2012  4:32 PM      Result Value Range   Glucose-Capillary 78  70 - 99 mg/dL  POCT I-STAT TROPONIN I     Status: None   Collection Time    12/27/2012  4:58 PM      Result Value Range   Troponin i, poc 0.01  0.00 - 0.08 ng/mL   Comment 3            Comment: Due to the release kinetics of cTnI,     a negative result within the first hours     of the onset of symptoms does not rule out     myocardial infarction with certainty.     If myocardial infarction is still suspected,     repeat the test at appropriate intervals.  URINALYSIS, ROUTINE W REFLEX MICROSCOPIC     Status: Abnormal   Collection Time    01/04/2013  8:32 PM      Result Value Range   Color, Urine AMBER (*) YELLOW   Comment: BIOCHEMICALS MAY BE AFFECTED BY COLOR   APPearance CLOUDY (*) CLEAR   Specific Gravity, Urine 1.018  1.005 - 1.030   pH 5.0  5.0 - 8.0   Glucose, UA NEGATIVE  NEGATIVE mg/dL   Hgb urine dipstick NEGATIVE  NEGATIVE   Bilirubin Urine LARGE (*) NEGATIVE   Ketones, ur 15 (*) NEGATIVE mg/dL   Protein, ur NEGATIVE  NEGATIVE mg/dL   Urobilinogen, UA 1.0  0.0 - 1.0 mg/dL   Nitrite POSITIVE (*) NEGATIVE   Leukocytes, UA MODERATE (*) NEGATIVE  URINE MICROSCOPIC-ADD ON     Status: Abnormal   Collection Time    12/16/2012  8:32 PM      Result Value Range   Squamous Epithelial / LPF MANY (*) RARE   WBC, UA 7-10  <3 WBC/hpf  Bacteria, UA MANY (*) RARE   Casts GRANULAR CAST (*) NEGATIVE  LACTIC ACID, PLASMA     Status: Abnormal   Collection Time    12/17/2012  8:40 PM      Result Value Range   Lactic Acid, Venous 3.2 (*) 0.5 - 2.2 mmol/L  PROCALCITONIN     Status: None   Collection Time    12/07/2012  8:40 PM      Result  Value Range   Procalcitonin 0.58     Comment:            Interpretation:     PCT > 0.5 ng/mL and <= 2 ng/mL:     Systemic infection (sepsis) is possible,     but other conditions are known to elevate     PCT as well.     (NOTE)             ICU PCT Algorithm               Non ICU PCT Algorithm        ----------------------------     ------------------------------             PCT < 0.25 ng/mL                 PCT < 0.1 ng/mL         Stopping of antibiotics            Stopping of antibiotics           strongly encouraged.               strongly encouraged.        ----------------------------     ------------------------------           PCT level decrease by               PCT < 0.25 ng/mL           >= 80% from peak PCT           OR PCT 0.25 - 0.5 ng/mL          Stopping of antibiotics                                                 encouraged.         Stopping of antibiotics               encouraged.        ----------------------------     ------------------------------           PCT level decrease by              PCT >= 0.25 ng/mL           < 80% from peak PCT            AND PCT >= 0.5 ng/mL            Continuing antibiotics                                                  encouraged.           Continuing antibiotics  encouraged.        ----------------------------     ------------------------------         PCT level increase compared          PCT > 0.5 ng/mL             with peak PCT AND              PCT >= 0.5 ng/mL             Escalation of antibiotics                                              strongly encouraged.          Escalation of antibiotics            strongly encouraged.  TROPONIN I     Status: None   Collection Time    12/24/2012  8:40 PM      Result Value Range   Troponin I <0.30  <0.30 ng/mL   Comment:            Due to the release kinetics of cTnI,     a negative result within the first hours     of the onset of symptoms does not rule out      myocardial infarction with certainty.     If myocardial infarction is still suspected,     repeat the test at appropriate intervals.  PROTIME-INR     Status: Abnormal   Collection Time    01/03/2013  8:40 PM      Result Value Range   Prothrombin Time 43.3 (*) 11.6 - 15.2 seconds   INR 5.02 (*) 0.00 - 1.49   Comment: CRITICAL RESULT CALLED TO, READ BACK BY AND VERIFIED WITH:     Elaina Pattee [id number] [facility]  APTT     Status: Abnormal   Collection Time    12/27/2012  8:40 PM      Result Value Range   aPTT 73 (*) 24 - 37 seconds   Comment:            IF BASELINE aPTT IS ELEVATED,     SUGGEST PATIENT RISK ASSESSMENT     BE USED TO DETERMINE APPROPRIATE     ANTICOAGULANT THERAPY.  FIBRINOGEN     Status: Abnormal   Collection Time    12/16/2012  8:40 PM      Result Value Range   Fibrinogen 132 (*) 204 - 475 mg/dL  GLUCOSE, CAPILLARY     Status: Abnormal   Collection Time    12/24/2012  8:42 PM      Result Value Range   Glucose-Capillary 61 (*) 70 - 99 mg/dL  TYPE AND SCREEN     Status: None   Collection Time    12/20/2012  8:48 PM      Result Value Range   ABO/RH(D) O NEG     Antibody Screen NEG     Sample Expiration 12/26/2012    GLUCOSE, CAPILLARY     Status: Abnormal   Collection Time    01/04/2013  9:16 PM      Result Value Range   Glucose-Capillary 125 (*) 70 - 99 mg/dL  GLUCOSE, CAPILLARY     Status: Abnormal   Collection Time    12/12/2012  9:53 PM      Result  Value Range   Glucose-Capillary 136 (*) 70 - 99 mg/dL    Ir Fluoro Guide Cv Line Right  12/22/2012   *RADIOLOGY REPORT*  Clinical Data: Bacteremia and need for IV antibiotic therapy.  PICC LINE PLACEMENT WITH ULTRASOUND AND FLUOROSCOPIC  GUIDANCE  Fluoroscopy Time: 6 seconds.  The right arm was prepped with chlorhexidine, draped in the usual sterile fashion using maximum barrier technique (cap and mask, sterile gown, sterile gloves, large sterile sheet, hand hygiene and cutaneous antisepsis) and infiltrated locally with  1% Lidocaine.  Ultrasound demonstrated patency of the right brachial vein, and this was documented with an image.  Under real-time ultrasound guidance, this vein was accessed with a 21 gauge micropuncture needle and image documentation was performed.  The needle was exchanged over a guidewire for a peel-away sheath through which a 5 [place] dual lumen PICC trimmed to 37 cm was advanced, positioned with its tip at the lower SVC/right atrial junction.  Fluoroscopy during the procedure and fluoro spot radiograph confirms appropriate catheter position.  The catheter was flushed, secured to the skin with Prolene sutures, and covered with a sterile dressing.  Complications:  None  IMPRESSION: Successful right arm PICC line placement with ultrasound and fluoroscopic guidance.  The catheter is ready for use.   Original Report Authenticated By: Irish Lack, M.D.   Ir US Guide Vasc Access Right  12/22/2012   *RADIOLOGY REPORT*  Clinical Data: Bacteremia and need for IV antibiotic therapy.  PICC LINE PLACEMENT WITH ULTRASOUND AND FLUOROSCOPIC  GUIDANCE  Fluoroscopy Time: 6 seconds.  The right arm was prepped with chlorhexidine, draped in the usual sterile fashion using maximum barrier technique (cap and mask, sterile gown, sterile gloves, large sterile sheet, hand hygiene and cutaneous antisepsis) and infiltrated locally with 1% Lidocaine.  Ultrasound demonstrated patency of the right brachial vein, and this was documented with an image.  Under real-time ultrasound guidance, this vein was accessed with a 21 gauge micropuncture needle and image documentation was performed.  The needle was exchanged over a guidewire for a peel-away sheath through which a 5 [place] dual lumen PICC trimmed to 37 cm was advanced, positioned with its tip at the lower SVC/right atrial junction.  Fluoroscopy during the procedure and fluoro spot radiograph confirms appropriate catheter position.  The catheter was flushed, secured to the skin with  Prolene sutures, and covered with a sterile dressing.  Complications:  None  IMPRESSION: Successful right arm PICC line placement with ultrasound and fluoroscopic guidance.  The catheter is ready for use.   Original Report Authenticated By: Irish Lack, M.D.   Dg Chest Port 1 View  01-15-13   *RADIOLOGY REPORT*  Clinical Data: Central line placement  PORTABLE CHEST - 1 VIEW  Comparison: Acute abdominal series 12/13/2012.  Findings: There is a left-sided internal jugular central venous catheter with tip terminating in the superior cavoatrial junction. There is a right upper extremity PICC with tip terminating in the mid to distal superior vena cava.  Lung volumes are normal. No pneumothorax.  Bibasilar opacities (left greater than right) with which may represent areas of atelectasis and/or consolidation. Small left and trace right pleural effusions.  Pulmonary venous congestion, without frank pulmonary edema.  Borderline cardiomegaly. The patient is rotated to the left on today's exam, resulting in distortion of the mediastinal contours and reduced diagnostic sensitivity and specificity for mediastinal pathology. Atherosclerosis in the thoracic aorta.  IMPRESSION: 1.  Support apparatus, as above. 2. Worsening bibasilar opacities which may reflect increasing areas of  atelectasis and/or consolidation. 3.  Small left and trace right-sided pleural effusions. 4.  Pulmonary venous congestion, without frank pulmonary edema.   Original Report Authenticated By: Trudie Reed, M.D.   Dg Abd Acute W/chest  01/01/2013   *RADIOLOGY REPORT*  Clinical Data: 67 year old female with weakness, dizziness, hepatic disease.  ACUTE ABDOMEN SERIES (ABDOMEN 2 VIEW & CHEST 1 VIEW)  Comparison:  Chest radiograph 11/27/2012. CT abdomen and pelvis 11/19/2012.  Findings: Right PICC line remains in place.  Improved lung volumes. Small bilateral pleural effusions.  Stable cardiac size and mediastinal contours.  Visualized tracheal air  column is within normal limits.  No pneumothorax or pulmonary edema.  Bibasilar atelectasis.  No pneumoperitoneum. Nonobstructed bowel gas pattern.Calcified atherosclerosis of the aorta.  Degenerative changes and mild scoliosis in the spine.  IMPRESSION: 1. Nonobstructed bowel gas pattern, no free air.  2.  Small bilateral pleural effusions.   Original Report Authenticated By: Erskine Speed, M.D.   Clinical Data: Abdominal pain. Nausea. Possible abdominal sepsis.  CT ABDOMEN AND PELVIS WITHOUT CONTRAST  Technique: Multidetector CT imaging of the abdomen and pelvis was  performed following the standard protocol without intravenous  contrast.  Comparison: CT of the abdomen and pelvis 11/19/2012.  Findings:  Lung Bases: Moderate right and small left-sided pleural effusions  layering dependently with areas of passive atelectasis in the lower  lobes of the lungs bilaterally. Extensive calcifications of the  mitral annulus. Calcifications of the aortic valve.  Atherosclerotic calcifications in the left main, left anterior  descending, left circumflex and right coronary arteries. A small  amount of pericardial fluid and/or thickening, unlikely to be of  hemodynamic significance at this time.  Abdomen/Pelvis: Numerous small calcified gallstones layering  dependently in the gallbladder. The unenhanced appearance of the  liver, pancreas, spleen, bilateral adrenal glands and bilateral  kidneys is unremarkable. There is extensive atherosclerosis  throughout the abdominal and pelvic vasculature, without definite  aneurysm.  Open midline abdominal wound is noted, presumably healing very  secondary intention. There is a small volume of ascites throughout  the abdomen and pelvis, with the largest collection surrounding the  liver. Mild diffuse mesenteric edema with some interloop fluid.  Numerous colonic diverticula are noted (presence of ascites limits  accurate assessment for acute diverticulitis).  Diffuse thickening  of the wall of the cecum and ascending colon. The multiple  borderline and mildly dilated loops of small bowel are noted.  However, oral contrast material extends all the way to the terminal  ileum, suggesting against frank bowel obstruction. No  pneumoperitoneum identified at this time. Large amount of low  attenuation material in the endometrial cavity, unusual for a  patient this age. Large venous collaterals in the left side of the  retroperitoneum are grossly similar to the prior study. Urinary  bladder is unremarkable in appearance.  Musculoskeletal: There are no aggressive appearing lytic or blastic  lesions noted in the visualized portions of the skeleton.  IMPRESSION:  1. Multiple borderline and mildly dilated loops of small bowel are  noted. However, oral contrast material extends all the way to the  level of the terminal ileum suggesting against frank bowel  obstruction. This pattern is favored to represent an ileus in this  patient.  2. Thickening of the cecum and ascending colon wall, suspicious  for colitis.  3. Diffuse mesenteric edema and small volume of ascites.  4. Cholelithiasis. Gallbladder does appear distended.  Unfortunately, secondary to the presence of ascites, accurate  assessment for acute cholecystitis  is not possible on this  examination. If there is clinical concern for acute cholecystitis,  this could be better evaluated with ultrasound examination.  5. Extensive colonic diverticulosis. The presence of ascites  limits accurate assessment for subtle inflammatory changes related  to acute diverticulitis. Although acute diverticulitis is not  strongly favored, it is difficult to exclude in this setting.  Clinical correlation is recommended.  6. Moderate right and small left-sided pleural effusions with  dependent passive atelectasis in the lower lobes of the lungs  bilaterally.  7. Atherosclerosis, including left main and three-vessel  coronary  artery disease. Assessment for potential risk factor  modification, dietary therapy or pharmacologic therapy may be  warranted, if clinically indicated.  8. Large amount of low attenuation material in the endometrial  canal is unusual in a post menopausal patient. Non emergent  transvaginal ultrasound is recommended for follow up in the near  future, has endometrial neoplasm could have a similar appearance.  9. Healing open midline abdominal wound presumably closing via  secondary intension.  10. Additional incidental findings, as above.  Original Report Authenticated By: Trudie Reed, M.D.   ROS Blood pressure 102/51, pulse 93, temperature 97.4 F (36.3 C), temperature source Oral, resp. rate 16, height 5' 4.96" (1.65 m), weight 151 lb 0.2 oz (68.5 kg), SpO2 97.00%. Physical Exam  Resting comfortably Noticeably jaundiced Reports no abdominal tenderness Wound - some central dry necrotic area; draining some ascites or thin serous fluid  Assessment/Plan:  Liver failure/ renal failure - unclear etiology Cholelithiasis - no sign of cholecystitis Does not appear to have a surgical abdomen at this time. Consult WOCN to place [facility] over midline wound Will continue to follow. Management per CCM.     [name] [name] K. 12/06/2012, 11:23 PM

## 2012-12-23 NOTE — ED Notes (Signed)
CBG 125 °

## 2012-12-23 NOTE — Telephone Encounter (Signed)
Post procedure follow up call attempted.  No answer.  VM left requesting return call with questions, concerns or comments.

## 2012-12-24 ENCOUNTER — Inpatient Hospital Stay (HOSPITAL_COMMUNITY): Payer: BC Managed Care – PPO

## 2012-12-24 DIAGNOSIS — I959 Hypotension, unspecified: Secondary | ICD-10-CM

## 2012-12-24 DIAGNOSIS — R404 Transient alteration of awareness: Secondary | ICD-10-CM

## 2012-12-24 DIAGNOSIS — N390 Urinary tract infection, site not specified: Secondary | ICD-10-CM

## 2012-12-24 LAB — CBC
HCT: 23.6 % — ABNORMAL LOW (ref 36.0–46.0)
MCH: 31.3 pg (ref 26.0–34.0)
MCH: 31.3 pg (ref 26.0–34.0)
MCHC: 33.6 g/dL (ref 30.0–36.0)
MCV: 93.2 fL (ref 78.0–100.0)
MCV: 93.7 fL (ref 78.0–100.0)
Platelets: 129 10*3/uL — ABNORMAL LOW (ref 150–400)
RBC: 2.78 MIL/uL — ABNORMAL LOW (ref 3.87–5.11)
RDW: 23.1 % — ABNORMAL HIGH (ref 11.5–15.5)
WBC: 12 10*3/uL — ABNORMAL HIGH (ref 4.0–10.5)

## 2012-12-24 LAB — BLOOD GAS, ARTERIAL
Acid-base deficit: 7.5 mmol/L — ABNORMAL HIGH (ref 0.0–2.0)
Bicarbonate: 16.5 mEq/L — ABNORMAL LOW (ref 20.0–24.0)
O2 Saturation: 94.1 %
pO2, Arterial: 71.6 mmHg — ABNORMAL LOW (ref 80.0–100.0)

## 2012-12-24 LAB — CORTISOL: Cortisol, Plasma: 12 ug/dL

## 2012-12-24 LAB — BASIC METABOLIC PANEL
BUN: 22 mg/dL (ref 6–23)
CO2: 18 mEq/L — ABNORMAL LOW (ref 19–32)
Chloride: 97 mEq/L (ref 96–112)
Creatinine, Ser: 2.65 mg/dL — ABNORMAL HIGH (ref 0.50–1.10)
GFR calc Af Amer: 21 mL/min — ABNORMAL LOW (ref >90)

## 2012-12-24 LAB — PROTIME-INR
INR: 2.55 — ABNORMAL HIGH (ref 0.00–1.49)
Prothrombin Time: 26.2 seconds — ABNORMAL HIGH (ref 11.6–15.2)

## 2012-12-24 LAB — LACTIC ACID, PLASMA: Lactic Acid, Venous: 2.4 mmol/L — ABNORMAL HIGH (ref 0.5–2.2)

## 2012-12-24 LAB — GLUCOSE, CAPILLARY
Glucose-Capillary: 118 mg/dL — ABNORMAL HIGH (ref 70–99)
Glucose-Capillary: 71 mg/dL (ref 70–99)
Glucose-Capillary: 76 mg/dL (ref 70–99)

## 2012-12-24 LAB — MRSA PCR SCREENING: MRSA by PCR: NEGATIVE

## 2012-12-24 MED ORDER — SODIUM CHLORIDE 0.9 % IV SOLN
250.0000 mg | Freq: Three times a day (TID) | INTRAVENOUS | Status: DC
  Administered 2012-12-24 – 2012-12-26 (×6): 250 mg via INTRAVENOUS
  Filled 2012-12-24 (×8): qty 250

## 2012-12-24 MED ORDER — VITAMIN B-1 100 MG PO TABS
100.0000 mg | ORAL_TABLET | Freq: Every day | ORAL | Status: DC
  Administered 2012-12-24 – 2013-01-04 (×12): 100 mg via ORAL
  Filled 2012-12-24 (×12): qty 1

## 2012-12-24 MED ORDER — WHITE PETROLATUM GEL
Status: AC
  Administered 2012-12-24: 12:00:00
  Filled 2012-12-24: qty 5

## 2012-12-24 MED ORDER — SODIUM CHLORIDE 0.9 % IV SOLN
1000.0000 mL | INTRAVENOUS | Status: DC
  Administered 2012-12-24: 1000 mL via INTRAVENOUS

## 2012-12-24 MED ORDER — HYDROCORTISONE SOD SUCCINATE 100 MG IJ SOLR
50.0000 mg | Freq: Four times a day (QID) | INTRAMUSCULAR | Status: DC
  Administered 2012-12-24: 50 mg via INTRAVENOUS
  Administered 2012-12-25: 12:00:00 via INTRAVENOUS
  Administered 2012-12-25 – 2012-12-28 (×13): 50 mg via INTRAVENOUS
  Filled 2012-12-24 (×20): qty 1

## 2012-12-24 MED ORDER — INSULIN ASPART 100 UNIT/ML ~~LOC~~ SOLN
0.0000 [IU] | Freq: Three times a day (TID) | SUBCUTANEOUS | Status: DC
  Administered 2012-12-25 – 2012-12-26 (×2): 2 [IU] via SUBCUTANEOUS

## 2012-12-24 MED ORDER — NOREPINEPHRINE BITARTRATE 1 MG/ML IJ SOLN
2.0000 ug/min | INTRAVENOUS | Status: DC
  Administered 2012-12-24: 12 ug/min via INTRAVENOUS
  Administered 2012-12-25: 14 ug/min via INTRAVENOUS
  Administered 2012-12-26: 12 ug/min via INTRAVENOUS
  Filled 2012-12-24 (×3): qty 16

## 2012-12-24 MED ORDER — FOLIC ACID 1 MG PO TABS
1.0000 mg | ORAL_TABLET | Freq: Every day | ORAL | Status: DC
  Administered 2012-12-24 – 2013-01-04 (×12): 1 mg via ORAL
  Filled 2012-12-24 (×12): qty 1

## 2012-12-24 MED ORDER — PANTOPRAZOLE SODIUM 40 MG PO TBEC
40.0000 mg | DELAYED_RELEASE_TABLET | Freq: Every day | ORAL | Status: DC
  Administered 2012-12-24 – 2012-12-27 (×4): 40 mg via ORAL
  Filled 2012-12-24 (×5): qty 1

## 2012-12-24 NOTE — Consult Note (Signed)
WOC consult Note Reason for Consult: placement of VAC for midline abdominal wound.  Heavy ascites draining from the distal aspect of the wound, central wound with necrotic tissue, sutures visible. Spoke with Dr. Derrell Lolling to verify pressure setting.  Wound type: Midline abdominal wound Measurement:19cm x 7cm x 0.5cm  Wound bed:50% necrotic, 50% pink granulation tissue, some depth at distal aspect Drainage (amount, consistency, odor) ascitic, serous drainage, heavy amounts, no real odor  Periwound: intact Dressing procedure/placement/frequency: Mepitel over distal wound, to protect site,  1pc. Black granufoam over entire wound bed. Seal at , pt tolerated well.  Bedside nurse to monitor drainage.  Supplies for next dressing change ordered.  WOC will remain available for assistance needed with VAC dressing, however please note ok for bedside staff to perform this change.   Jovonna Nickell [place] RN,CWOCN [phone]

## 2012-12-24 NOTE — Consult Note (Signed)
ANTIBIOTIC CONSULT NOTE - Follow-up  Pharmacy Consult for Vancomycin + Primaxin Indication: ascites  Allergies  Allergen Reactions  . Erythromycin Swelling  . Etomidate     Myoclonus side effect pronounced  . Cephalexin Rash  . Penicillins Rash    Patient Measurements: Height: 5' 4.96" (165 cm) Weight: 151 lb 0.2 oz (68.5 kg) IBW/kg (Calculated) : 56.91  Vital Signs: Temp: 97.6 F (36.4 C) (06/19 1200) Temp src: Oral (06/19 0800) BP: 96/56 mmHg (06/19 1200) Pulse Rate: 101 (06/19 1200) Intake/Output from previous day: 06/18 0701 - 06/19 0700 In: 2234.4 [I.V.:1721.4; Blood:313; IV Piggyback:200] Out: 8 [Urine:4; Stool:4] Intake/Output from this shift: Total I/O In: 692.6 [I.V.:692.6] Out: 2 [Urine:1; Stool:1]  Labs:  Recent Labs  12/18/2012 1610 12/17/2012 2327 12/24/12 0500  WBC 12.9* 13.8* 12.0*  HGB 9.2* 8.7* 7.9*  PLT 120* 129* 108*  CREATININE 2.82* 2.71* 2.65*   Estimated Creatinine Clearance: 20.8 ml/min (by C-G formula based on Cr of 2.65).  Microbiology: Recent Results (from the past 720 hour(s))  CLOSTRIDIUM DIFFICILE BY PCR     Status: None   Collection Time    11/27/12  3:20 AM      Result Value Range Status   C difficile by pcr NEGATIVE  NEGATIVE Final  MRSA PCR SCREENING     Status: None   Collection Time    12/22/2012  9:55 PM      Result Value Range Status   MRSA by PCR NEGATIVE  NEGATIVE Final   Comment:            The GeneXpert MRSA Assay (FDA     approved for NASAL specimens     only), is one component of a     comprehensive MRSA colonization     surveillance program. It is not     intended to diagnose MRSA     infection nor to guide or     monitor treatment for     MRSA infections.    Medical History: Past Medical History  Diagnosis Date  . Hypertension   . H/O brain surgery   . Aneurysm of anterior cerebral artery   . Hernia   . Anxiety   . Stroke   . Anemia   . Arthritis   . DM2 (diabetes mellitus, type 2) 12/20/2012    Assessment: 69 yoF with history of ETOH abuse s/p emergent laparotomy for jejunal perforation 11/12/12 was seen at [facility] today for post-op follow up. She was found to be hypotensive, jaundiced, and stated that her abdomen felt larger. She is also in ARF with SCr of 2.82 on admit, decreased to 2.65 today with CrCl ~ 56ml/min. On empiric antibiotics for probable ascites.  6/18 vanc >> 6/18 primaxin>>  6/18 MRSA PCR neg 6/18 urine>>pending   Goal of Therapy:  Vancomycin trough level 15-20 mcg/ml Appropriate primaxin dosing  Plan:  1) Continue Vancomycin 1g IV q48 2) Change Primaxin to 250mg  IV q8h 3) Follow renal function, cultures, trough at steady state  Tiphany Fayson L. Illene Bolus, PharmD, BCPS Clinical Pharmacist Pager: [phone] Pharmacy: [phone] 12/24/2012 2:37 PM

## 2012-12-24 NOTE — Progress Notes (Signed)
[facility] PCCM  CT Abdomen ordered INR noted to be >5 Vit K given, 2 U FFP ordered  Will repeat INR and re-attempt paracentesis once corrected.  Yolonda Kida PCCM Pager: [phone] Cell: [phone] If no response, call [phone]

## 2012-12-24 NOTE — Progress Notes (Addendum)
Subjective: Patient well known to me from prior hospitalization for jejunal perforation, And from office visit yesterday.  Currently awake and alert. Poor insight and confabulating. Denies abdominal pain. Denies nausea.Deeply jaundiced  CT scan reviewed. Gallstones noted But doubt cholecystitis. Possible right sided colitis.Otherwise just postop changes, and no evidence of intra-abdominal source of sepsis.    Objective: Vital signs in last 24 hours: Temp:  [95.1 F (35.1 C)-97.4 F (36.3 C)] 97.2 F (36.2 C) (06/19 0415) Pulse Rate:  [72-98] 96 (06/19 0700) Resp:  [12-26] 20 (06/19 0700) BP: (79-120)/(35-78) 90/47 mmHg (06/19 0700) SpO2:  [94 %-99 %] 96 % (06/19 0700) Weight:  [151 lb (68.493 kg)-151 lb 0.2 oz (68.5 kg)] 151 lb 0.2 oz (68.5 kg) (06/18 1700)    Intake/Output from previous day: 06/18 0701 - 06/19 0700 In: 2194.9 [I.V.:1681.9; Blood:313; IV Piggyback:200] Out: 8 [Urine:4; Stool:4] Intake/Output this shift:    General appearance: alert. Cooperative. Not agitated. Poor insight and memory. Confabulates. Does not appear toxic deeply jaundiced GI: abdomen obese. Soft. Nontender. Midline wound reveals good granulation tissue in the upper and lower hands but centrally there is some dry necrosis. No enteric drainage. Minimal ascites leaking.  Lab Results:   Recent Labs  2013-01-16 2327 12/24/12 0500  WBC 13.8* 12.0*  HGB 8.7* 7.9*  HCT 25.9* 23.6*  PLT 129* PENDING   BMET  Recent Labs  01/16/2013 1610 January 16, 2013 2327 12/24/12 0500  NA 122*  --  127*  K 5.1  --  4.1  CL 91*  --  97  CO2 18*  --  18*  GLUCOSE 82  --  107*  BUN 23  --  22  CREATININE 2.82* 2.71* 2.65*  CALCIUM 7.1*  --  7.2*   PT/INR  Recent Labs  01-16-2013 2040  LABPROT 43.3*  INR 5.02*   ABG  Recent Labs  12/24/12 0424  PHART 7.405  HCO3 16.5*    Studies/Results: Ct Abdomen Pelvis Wo Contrast  12/24/2012   *RADIOLOGY REPORT*  Clinical Data: Abdominal pain.  Nausea.   Possible abdominal sepsis.  CT ABDOMEN AND PELVIS WITHOUT CONTRAST  Technique:  Multidetector CT imaging of the abdomen and pelvis was performed following the standard protocol without intravenous contrast.  Comparison: CT of the abdomen and pelvis 11/19/2012.  Findings:  Lung Bases: Moderate right and small left-sided pleural effusions layering dependently with areas of passive atelectasis in the lower lobes of the lungs bilaterally.  Extensive calcifications of the mitral annulus.  Calcifications of the aortic valve. Atherosclerotic calcifications in the left main, left anterior descending, left circumflex and right coronary arteries.  A small amount of pericardial fluid and/or thickening, unlikely to be of hemodynamic significance at this time.  Abdomen/Pelvis:  Numerous small calcified gallstones layering dependently in the gallbladder.  The unenhanced appearance of the liver, pancreas, spleen, bilateral adrenal glands and bilateral kidneys is unremarkable.  There is extensive atherosclerosis throughout the abdominal and pelvic vasculature, without definite aneurysm.  Open midline abdominal wound is noted, presumably healing very secondary intention.  There is a small volume of ascites throughout the abdomen and pelvis, with the largest collection surrounding the liver.  Mild diffuse mesenteric edema with some interloop fluid. Numerous colonic diverticula are noted (presence of ascites limits accurate assessment for acute diverticulitis).  Diffuse thickening of the wall of the cecum and ascending colon.  The multiple borderline and mildly dilated loops of small bowel are noted. However, oral contrast material extends all the way to the terminal ileum, suggesting  against frank bowel obstruction.  No pneumoperitoneum identified at this time.  Large amount of low attenuation material in the endometrial cavity, unusual for a patient this age. Large venous collaterals in the left side of the retroperitoneum are  grossly similar to the prior study.  Urinary bladder is unremarkable in appearance.  Musculoskeletal: There are no aggressive appearing lytic or blastic lesions noted in the visualized portions of the skeleton.  IMPRESSION: 1.  Multiple borderline and mildly dilated loops of small bowel are noted.  However, oral contrast material extends all the way to the level of the terminal ileum suggesting against frank bowel obstruction.  This pattern is favored to represent an ileus in this patient.  2.  Thickening of the cecum and ascending colon wall, suspicious for colitis. 3.  Diffuse mesenteric edema and small volume of ascites. 4.  Cholelithiasis.  Gallbladder does appear distended. Unfortunately, secondary to the presence of ascites, accurate assessment for acute cholecystitis is not possible on this examination.  If there is clinical concern for acute cholecystitis, this could be better evaluated with ultrasound examination. 5.  Extensive colonic diverticulosis.  The presence of ascites limits accurate assessment for subtle inflammatory changes related to acute diverticulitis.  Although acute diverticulitis is not strongly favored, it is difficult to exclude in this setting. Clinical correlation is recommended. 6.  Moderate right and small left-sided pleural effusions with dependent passive atelectasis in the lower lobes of the lungs bilaterally. 7. Atherosclerosis, including left main and three-vessel coronary artery disease.   Assessment for potential risk factor modification, dietary therapy or pharmacologic therapy may be warranted, if clinically indicated. 8.  Large amount of low attenuation material in the endometrial canal is unusual in a post menopausal patient.  Non emergent transvaginal ultrasound is recommended for follow up in the near future, has endometrial neoplasm could have a similar appearance. 9.  Healing open midline abdominal wound presumably closing via secondary intension. 10.  Additional  incidental findings, as above.   Original Report Authenticated By: Trudie Reed, M.D.   Ir Fluoro Guide Cv Line Right  12/22/2012   *RADIOLOGY REPORT*  Clinical Data: Bacteremia and need for IV antibiotic therapy.  PICC LINE PLACEMENT WITH ULTRASOUND AND FLUOROSCOPIC  GUIDANCE  Fluoroscopy Time: 6 seconds.  The right arm was prepped with chlorhexidine, draped in the usual sterile fashion using maximum barrier technique (cap and mask, sterile gown, sterile gloves, large sterile sheet, hand hygiene and cutaneous antisepsis) and infiltrated locally with 1% Lidocaine.  Ultrasound demonstrated patency of the right brachial vein, and this was documented with an image.  Under real-time ultrasound guidance, this vein was accessed with a 21 gauge micropuncture needle and image documentation was performed.  The needle was exchanged over a guidewire for a peel-away sheath through which a 5 [place] dual lumen PICC trimmed to 37 cm was advanced, positioned with its tip at the lower SVC/right atrial junction.  Fluoroscopy during the procedure and fluoro spot radiograph confirms appropriate catheter position.  The catheter was flushed, secured to the skin with Prolene sutures, and covered with a sterile dressing.  Complications:  None  IMPRESSION: Successful right arm PICC line placement with ultrasound and fluoroscopic guidance.  The catheter is ready for use.   Original Report Authenticated By: Irish Lack, M.D.   Ir US Guide Vasc Access Right  12/22/2012   *RADIOLOGY REPORT*  Clinical Data: Bacteremia and need for IV antibiotic therapy.  PICC LINE PLACEMENT WITH ULTRASOUND AND FLUOROSCOPIC  GUIDANCE  Fluoroscopy Time: 6  seconds.  The right arm was prepped with chlorhexidine, draped in the usual sterile fashion using maximum barrier technique (cap and mask, sterile gown, sterile gloves, large sterile sheet, hand hygiene and cutaneous antisepsis) and infiltrated locally with 1% Lidocaine.  Ultrasound demonstrated  patency of the right brachial vein, and this was documented with an image.  Under real-time ultrasound guidance, this vein was accessed with a 21 gauge micropuncture needle and image documentation was performed.  The needle was exchanged over a guidewire for a peel-away sheath through which a 5 [place] dual lumen PICC trimmed to 37 cm was advanced, positioned with its tip at the lower SVC/right atrial junction.  Fluoroscopy during the procedure and fluoro spot radiograph confirms appropriate catheter position.  The catheter was flushed, secured to the skin with Prolene sutures, and covered with a sterile dressing.  Complications:  None  IMPRESSION: Successful right arm PICC line placement with ultrasound and fluoroscopic guidance.  The catheter is ready for use.   Original Report Authenticated By: Irish Lack, M.D.   Dg Chest Port 1 View  01/02/2013   *RADIOLOGY REPORT*  Clinical Data: Central line placement  PORTABLE CHEST - 1 VIEW  Comparison: Acute abdominal series 12/13/2012.  Findings: There is a left-sided internal jugular central venous catheter with tip terminating in the superior cavoatrial junction. There is a right upper extremity PICC with tip terminating in the mid to distal superior vena cava.  Lung volumes are normal. No pneumothorax.  Bibasilar opacities (left greater than right) with which may represent areas of atelectasis and/or consolidation. Small left and trace right pleural effusions.  Pulmonary venous congestion, without frank pulmonary edema.  Borderline cardiomegaly. The patient is rotated to the left on today's exam, resulting in distortion of the mediastinal contours and reduced diagnostic sensitivity and specificity for mediastinal pathology. Atherosclerosis in the thoracic aorta.  IMPRESSION: 1.  Support apparatus, as above. 2. Worsening bibasilar opacities which may reflect increasing areas of atelectasis and/or consolidation. 3.  Small left and trace right-sided pleural  effusions. 4.  Pulmonary venous congestion, without frank pulmonary edema.   Original Report Authenticated By: Trudie Reed, M.D.   Dg Abd Acute W/chest  12/27/2012   *RADIOLOGY REPORT*  Clinical Data: 67 year old female with weakness, dizziness, hepatic disease.  ACUTE ABDOMEN SERIES (ABDOMEN 2 VIEW & CHEST 1 VIEW)  Comparison:  Chest radiograph 11/27/2012. CT abdomen and pelvis 11/19/2012.  Findings: Right PICC line remains in place.  Improved lung volumes. Small bilateral pleural effusions.  Stable cardiac size and mediastinal contours.  Visualized tracheal air column is within normal limits.  No pneumothorax or pulmonary edema.  Bibasilar atelectasis.  No pneumoperitoneum. Nonobstructed bowel gas pattern.Calcified atherosclerosis of the aorta.  Degenerative changes and mild scoliosis in the spine.  IMPRESSION: 1. Nonobstructed bowel gas pattern, no free air.  2.  Small bilateral pleural effusions.   Original Report Authenticated By: Erskine Speed, M.D.    Anti-infectives: Anti-infectives   Start     Dose/Rate Route Frequency Ordered Stop   12/11/2012 1830  vancomycin (VANCOCIN) IVPB 1000 mg/200 mL premix     1,000 mg 200 mL/hr over 60 Minutes Intravenous Every 48 hours 12/13/2012 1820     12/17/2012 1830  imipenem-cilastatin (PRIMAXIN) 250 mg in sodium chloride 0.9 % 100 mL IVPB     250 mg 200 mL/hr over 30 Minutes Intravenous Every 12 hours 12/20/2012 1820        Assessment/Plan:  Hypotension, presumed sepsis. There is no clinical or radiographic evidence of  surgical abdominal  source at this time.  Question right sided colitis radiographically Check C.Diff PCR.(addendum...reported as negative 12/25/2012)  OK to allow clears  Wound care for nonhealing midline wound.. Wound debridement is relatively contraindicated this point due to risk of intestinal injury and ascitic leak.  Hepatic failure with jaundice, encephalopathy and coagulopathy. Vitamin K and FFP ordered.  Severe protein calorie  malnutrition  history jejunal perforation with diffuse peritonitis, status post laparotomy and primary repair 11/13/2012, , subsequent fascial closure.   Short-term prognosis fair to guarded. Long-term prognosis is poor due to hepatic failure, Possible hepatorenal syndrome.       LOS: 1 day    Minh Roanhorse M. Derrell Lolling, M.D., [facility] General and Minimally invasive Surgery Breast and Colorectal Surgery Office:   [phone] Pager:   [phone]  12/24/2012

## 2012-12-24 NOTE — Clinical Social Work Note (Signed)
Pt is from [facility].  CSW received call from Tammy from [facility] stating they will accept pt back when she's ready for discharge.  CSW will follow.

## 2012-12-24 NOTE — Progress Notes (Addendum)
PULMONARY  / CRITICAL CARE MEDICINE  Name: [name] [name] MRN: [id number] DOB: 1946-05-18    ADMISSION DATE:  January 04, 2013 CONSULTATION DATE:  01/04/13  REFERRING MD :  EDP PRIMARY SERVICE: PCCM  CHIEF COMPLAINT:  Septic shock  BRIEF PATIENT DESCRIPTION: 67 y/o female with alcoholic cirrhosis was admitted on 6/18 through the [facility] ED for septic shock in the setting of a recent diagnosis of a "bloodstream infection".    SIGNIFICANT EVENTS: 6/18 admission 6/19 Add solu cortef  STUDIES: 6/19 CT abd/pelvis >> Rt > Lt pleural effusions, numerous calcified gallstones, small volume ascites, mesenteric edema, diffuse thickening of cecum and ascending colon 6/19 Doppler abd >> no portal vein thrombosis  LINES / TUBES: 6/17 R arm PICC >> 6/18 Lt IJ CVL >>   CULTURES: 6/16 blood >> [facility] in clusters/pairs ([facility]) 6/18 blood >> 6/18 urine >>  ANTIBIOTICS: 6/14 bactrim >> 6/18 [facility]) 6/17 Cipro >> 6/18 [facility] 6/18 vanc >> 6/18 imipenem >>  SUBJECTIVE:  Feels better.  Denies chest pain/dyspnea/nausea.  Remains on pressors.  VITAL SIGNS: Temp:  [95.1 F (35.1 C)-97.6 F (36.4 C)] 97.6 F (36.4 C) (06/19 1200) Pulse Rate:  [87-102] 101 (06/19 1200) Resp:  [12-26] 17 (06/19 1200) BP: (79-114)/(35-74) 96/56 mmHg (06/19 1200) SpO2:  [93 %-99 %] 97 % (06/19 1200) Weight:  [151 lb 0.2 oz (68.5 kg)] 151 lb 0.2 oz (68.5 kg) (06/18 1700) HEMODYNAMICS: CVP:  [2 mmHg-13 mmHg] 12 mmHg Room air  INTAKE / OUTPUT: Intake/Output     06/18 0701 - 06/19 0700 06/19 0701 - 06/20 0700   I.V. (mL/kg) 1721.4 (25.1) 692.6 (10.1)   Blood 313    IV Piggyback 200    Total Intake(mL/kg) 2234.4 (32.6) 692.6 (10.1)   Urine (mL/kg/hr) 4 1 (0)   Stool 4 1 (0)   Total Output 8 2   Net +2226.4 +690.6        Urine Occurrence  1 x   Stool Occurrence  1 x     PHYSICAL EXAMINATION:  Gen: No distress HEENT: Jaundiced PULM: faint crackles at bases, no wheeze CV: regular,  tachycardic AB: wound dressing wet with ascitic fluid leakage Ext: anasarca Derm: R leg wound> very superficial and small skin laceration < 1cm long, no surrounding erythema or warmth Neuro: Alert, normal strength, follows commands   LABS:  Recent Labs Lab 01/04/2013 1610 January 04, 2013 2040 01-04-13 2327 12/24/12 0240 12/24/12 0424 12/24/12 0500  HGB 9.2*  --  8.7*  --   --  7.9*  WBC 12.9*  --  13.8*  --   --  12.0*  PLT 120*  --  129*  --   --  108*  NA 122*  --   --   --   --  127*  K 5.1  --   --   --   --  4.1  CL 91*  --   --   --   --  97  CO2 18*  --   --   --   --  18*  GLUCOSE 82  --   --   --   --  107*  BUN 23  --   --   --   --  22  CREATININE 2.82*  --  2.71*  --   --  2.65*  CALCIUM 7.1*  --   --   --   --  7.2*  AST 137*  --   --   --   --   --  ALT 18  --   --   --   --   --   ALKPHOS 117  --   --   --   --   --   BILITOT 15.7*  15.7*  --   --   --   --   --   PROT 6.2  --   --   --   --   --   ALBUMIN 0.9*  --   --   --   --   --   APTT  --  73*  --   --   --   --   INR  --  5.02*  --   --   --  2.55*  LATICACIDVEN  --  3.2*  --  2.4*  --   --   TROPONINI  --  <0.30  --   --   --   --   PROCALCITON  --  0.58  --   --   --   --   PHART  --   --   --   --  7.405  --   PCO2ART  --   --   --   --  26.6*  --   PO2ART  --   --   --   --  71.6*  --     Recent Labs Lab 2013-01-15 2153 15-Jan-2013 2343 12/24/12 0404 12/24/12 0827 12/24/12 1158  GLUCAP 136* 118* 106* 94 76    Imaging: Ct Abdomen Pelvis Wo Contrast  12/24/2012   *RADIOLOGY REPORT*  Clinical Data: Abdominal pain.  Nausea.  Possible abdominal sepsis.  CT ABDOMEN AND PELVIS WITHOUT CONTRAST  Technique:  Multidetector CT imaging of the abdomen and pelvis was performed following the standard protocol without intravenous contrast.  Comparison: CT of the abdomen and pelvis 11/19/2012.  Findings:  Lung Bases: Moderate right and small left-sided pleural effusions layering dependently with areas of passive  atelectasis in the lower lobes of the lungs bilaterally.  Extensive calcifications of the mitral annulus.  Calcifications of the aortic valve. Atherosclerotic calcifications in the left main, left anterior descending, left circumflex and right coronary arteries.  A small amount of pericardial fluid and/or thickening, unlikely to be of hemodynamic significance at this time.  Abdomen/Pelvis:  Numerous small calcified gallstones layering dependently in the gallbladder.  The unenhanced appearance of the liver, pancreas, spleen, bilateral adrenal glands and bilateral kidneys is unremarkable.  There is extensive atherosclerosis throughout the abdominal and pelvic vasculature, without definite aneurysm.  Open midline abdominal wound is noted, presumably healing very secondary intention.  There is a small volume of ascites throughout the abdomen and pelvis, with the largest collection surrounding the liver.  Mild diffuse mesenteric edema with some interloop fluid. Numerous colonic diverticula are noted (presence of ascites limits accurate assessment for acute diverticulitis).  Diffuse thickening of the wall of the cecum and ascending colon.  The multiple borderline and mildly dilated loops of small bowel are noted. However, oral contrast material extends all the way to the terminal ileum, suggesting against frank bowel obstruction.  No pneumoperitoneum identified at this time.  Large amount of low attenuation material in the endometrial cavity, unusual for a patient this age. Large venous collaterals in the left side of the retroperitoneum are grossly similar to the prior study.  Urinary bladder is unremarkable in appearance.  Musculoskeletal: There are no aggressive appearing lytic or blastic lesions noted in the visualized portions of the skeleton.  IMPRESSION: 1.  Multiple borderline and mildly dilated loops of small bowel are noted.  However, oral contrast material extends all the way to the level of the terminal ileum  suggesting against frank bowel obstruction.  This pattern is favored to represent an ileus in this patient.  2.  Thickening of the cecum and ascending colon wall, suspicious for colitis. 3.  Diffuse mesenteric edema and small volume of ascites. 4.  Cholelithiasis.  Gallbladder does appear distended. Unfortunately, secondary to the presence of ascites, accurate assessment for acute cholecystitis is not possible on this examination.  If there is clinical concern for acute cholecystitis, this could be better evaluated with ultrasound examination. 5.  Extensive colonic diverticulosis.  The presence of ascites limits accurate assessment for subtle inflammatory changes related to acute diverticulitis.  Although acute diverticulitis is not strongly favored, it is difficult to exclude in this setting. Clinical correlation is recommended. 6.  Moderate right and small left-sided pleural effusions with dependent passive atelectasis in the lower lobes of the lungs bilaterally. 7. Atherosclerosis, including left main and three-vessel coronary artery disease.   Assessment for potential risk factor modification, dietary therapy or pharmacologic therapy may be warranted, if clinically indicated. 8.  Large amount of low attenuation material in the endometrial canal is unusual in a post menopausal patient.  Non emergent transvaginal ultrasound is recommended for follow up in the near future, has endometrial neoplasm could have a similar appearance. 9.  Healing open midline abdominal wound presumably closing via secondary intension. 10.  Additional incidental findings, as above.   Original Report Authenticated By: Trudie Reed, M.D.   [place] Art/ven Flow Abd Pelv Doppler  12/24/2012   *RADIOLOGY REPORT*  Clinical Data: Worsening hepatic failure and ascites.  DUPLEX ULTRASOUND OF LIVER  Technique:  Color and duplex Doppler ultrasound was performed to evaluate the hepatic in-flow and out-flow vessels.  Comparison:  CT of the abdomen  and pelvis on 12/24/2012  Portal Vein Velocities: Main:      41 - 50 cm/sec Right:      16 cm/sec Left:        20 cm/sec  Flow in the main portal vein is in a retrograde direction consistent with hepatofugal flow.  Hepatic Vein Velocities: Right:     Not visualized. cm/sec Middle:  Not visualized. cm/sec Left:       Not visualized. cm/sec  Hepatic Artery Velocity:   247 cm/sec Splenic Vein Velocity:      36 cm/sec  Varices:   None visualized. Ascites:   Small amount of ascites visualized.  Findings: The portal vein flow is away from the liver / hepatofugal.  This is consistent with underlying significant liver disease and portal hypertension.  No evidence of portal vein thrombosis.  The hepatic veins are not definitely visualized by color Doppler.  Flow is detected in the intrahepatic IVC.  A component of hepatic venoocclusive disease cannot be excluded.  Liver echotexture shows a focal rounded hyperechoic area in the high right lobe measuring up to 2 cm in diameter.  This may be a hemangioma or potentially regenerating/dysplastic nodule.  Eventual correlation with MRI of the abdomen may be beneficial.  The splenic vein is open.  The spleen is not overtly enlarged with estimated volume of 271 ml.  IMPRESSION:  1.  Hepatofugal portal vein flow consistent with underlying significant liver disease.  There is no evidence of portal vein thrombosis. 2.  Lack of visualization of the hepatic veins by color Doppler.  A component of hepatic venoocclusive  disease is not excluded.  The intrahepatic IVC is not occluded. 3.  2 cm hyperechoic rounded lesion in the right lobe of the liver. This most likely represents any angioma.  However, given the significant underlying liver disease present, eventual correlation with MRI of the abdomen may be helpful when the patient can tolerate it. 4.  No overt splenomegaly or evidence of splenic vein thrombosis. 5.  Small amount of ascites in the peritoneal cavity.   Original Report  Authenticated By: Irish Lack, M.D.   Dg Chest Port 1 View  01/01/2013   *RADIOLOGY REPORT*  Clinical Data: Central line placement  PORTABLE CHEST - 1 VIEW  Comparison: Acute abdominal series 12/13/2012.  Findings: There is a left-sided internal jugular central venous catheter with tip terminating in the superior cavoatrial junction. There is a right upper extremity PICC with tip terminating in the mid to distal superior vena cava.  Lung volumes are normal. No pneumothorax.  Bibasilar opacities (left greater than right) with which may represent areas of atelectasis and/or consolidation. Small left and trace right pleural effusions.  Pulmonary venous congestion, without frank pulmonary edema.  Borderline cardiomegaly. The patient is rotated to the left on today's exam, resulting in distortion of the mediastinal contours and reduced diagnostic sensitivity and specificity for mediastinal pathology. Atherosclerosis in the thoracic aorta.  IMPRESSION: 1.  Support apparatus, as above. 2. Worsening bibasilar opacities which may reflect increasing areas of atelectasis and/or consolidation. 3.  Small left and trace right-sided pleural effusions. 4.  Pulmonary venous congestion, without frank pulmonary edema.   Original Report Authenticated By: Trudie Reed, M.D.   Dg Abd Acute W/chest  12/31/2012   *RADIOLOGY REPORT*  Clinical Data: 67 year old female with weakness, dizziness, hepatic disease.  ACUTE ABDOMEN SERIES (ABDOMEN 2 VIEW & CHEST 1 VIEW)  Comparison:  Chest radiograph 11/27/2012. CT abdomen and pelvis 11/19/2012.  Findings: Right PICC line remains in place.  Improved lung volumes. Small bilateral pleural effusions.  Stable cardiac size and mediastinal contours.  Visualized tracheal air column is within normal limits.  No pneumothorax or pulmonary edema.  Bibasilar atelectasis.  No pneumoperitoneum. Nonobstructed bowel gas pattern.Calcified atherosclerosis of the aorta.  Degenerative changes and mild  scoliosis in the spine.  IMPRESSION: 1. Nonobstructed bowel gas pattern, no free air.  2.  Small bilateral pleural effusions.   Original Report Authenticated By: Erskine Speed, M.D.     ASSESSMENT / PLAN:  PULMONARY A: Small pleural effusions >> in setting of cirrhosis, ascites, and low albumin state. P:   -oxygen as needed to keep SpO2 > 92% -f/u CXR as needed -no indication for thoracentesis at present  CARDIOVASCULAR A: Septic shock with report of bacteremia from nursing home. Hx of HTN. P:  -even fluid balance -may need to use albumin to support hemodynamics -wean off pressors to keep MAP > 65 -hold benazepril, labetalol  RENAL A: Acute renal failure likely from septic shock and ACE inhibitor use >> baseline creatinine 0.79 from 11/26/12.  P:   -continue hemodynamic support -monitor renal fx, urine outpt, electrolytes -check FeNA  GASTROINTESTINAL A:  ETOH cirrhosis with hypoalbuminemia. 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 Protein calorie malnutrition. P:   -wound care per CCS and wound care team -clear liquid diet -protonix for SUP  HEMATOLOGIC A: Anemia, thrombocytopenia of critical illness and chronic disease. P:  -f/u CBC -SCD for DVT prevention  INFECTIOUS A: Septic shock >> possible sources are UTI, bacteremia, colitis, and  SBP.  Not enough peritoneal fluid to do paracentesis >> significant leakage from wound site. P:   -D2/x vancomycin, imipenem -continue to call [facility] [phone]) for 6/16 blood culture results -f/u cultures here -f/u stool for C diff  ENDOCRINE A:  DM type II. Relative adrenal insufficiency >> cortisol 12 from 6/18. P:   -SSI -start solu cortef 6/19  NEUROLOGIC A:  Mild hepatic encephalopathy P:   -Lactulose titrated to three stools/day -minimize sedating meds  Updated family at bedside, and discuss plan with Dr. Derrell Lolling.  CC time 35 minutes.  Coralyn Helling, MD [facility] Pulmonary/Critical Care 12/24/2012, 2:48 PM Pager:  [phone] After 3pm call: [phone]

## 2012-12-25 ENCOUNTER — Telehealth (INDEPENDENT_AMBULATORY_CARE_PROVIDER_SITE_OTHER): Payer: Self-pay

## 2012-12-25 DIAGNOSIS — A4901 Methicillin susceptible Staphylococcus aureus infection, unspecified site: Secondary | ICD-10-CM

## 2012-12-25 DIAGNOSIS — A419 Sepsis, unspecified organism: Secondary | ICD-10-CM

## 2012-12-25 DIAGNOSIS — R7881 Bacteremia: Secondary | ICD-10-CM

## 2012-12-25 DIAGNOSIS — E871 Hypo-osmolality and hyponatremia: Secondary | ICD-10-CM

## 2012-12-25 LAB — GI PATHOGEN PANEL BY PCR, STOOL
Cryptosporidium by PCR: NEGATIVE
E coli (ETEC) LT/ST: NEGATIVE
Norovirus GI/GII: NEGATIVE
Rotavirus A by PCR: NEGATIVE
Shigella by PCR: NEGATIVE

## 2012-12-25 LAB — URINALYSIS, ROUTINE W REFLEX MICROSCOPIC
Ketones, ur: NEGATIVE mg/dL
Nitrite: NEGATIVE
Protein, ur: NEGATIVE mg/dL
pH: 5.5 (ref 5.0–8.0)

## 2012-12-25 LAB — POCT I-STAT 3, ART BLOOD GAS (G3+)
Patient temperature: 98.6
pCO2 arterial: 23.5 mmHg — ABNORMAL LOW (ref 35.0–45.0)
pH, Arterial: 7.365 (ref 7.350–7.450)

## 2012-12-25 LAB — COMPREHENSIVE METABOLIC PANEL
ALT: 31 U/L (ref 0–35)
AST: 85 U/L — ABNORMAL HIGH (ref 0–37)
CO2: 16 mEq/L — ABNORMAL LOW (ref 19–32)
Chloride: 99 mEq/L (ref 96–112)
Creatinine, Ser: 2.88 mg/dL — ABNORMAL HIGH (ref 0.50–1.10)
GFR calc non Af Amer: 16 mL/min — ABNORMAL LOW (ref >90)
Glucose, Bld: 76 mg/dL (ref 70–99)
Sodium: 125 mEq/L — ABNORMAL LOW (ref 135–145)
Total Bilirubin: 16.4 mg/dL — ABNORMAL HIGH (ref 0.3–1.2)

## 2012-12-25 LAB — GLUCOSE, CAPILLARY
Glucose-Capillary: 76 mg/dL (ref 70–99)
Glucose-Capillary: 90 mg/dL (ref 70–99)
Glucose-Capillary: 158 mg/dL — ABNORMAL HIGH (ref 70–99)
Glucose-Capillary: 206 mg/dL — ABNORMAL HIGH (ref 70–99)

## 2012-12-25 LAB — BASIC METABOLIC PANEL
CO2: 13 mEq/L — ABNORMAL LOW (ref 19–32)
Calcium: 7.4 mg/dL — ABNORMAL LOW (ref 8.4–10.5)
Creatinine, Ser: 2.98 mg/dL — ABNORMAL HIGH (ref 0.50–1.10)
GFR calc Af Amer: 18 mL/min — ABNORMAL LOW (ref >90)
GFR calc non Af Amer: 16 mL/min — ABNORMAL LOW (ref >90)

## 2012-12-25 LAB — URINE MICROSCOPIC-ADD ON

## 2012-12-25 LAB — PREPARE FRESH FROZEN PLASMA: Unit division: 0

## 2012-12-25 LAB — URINE CULTURE
Colony Count: NO GROWTH
Culture: NO GROWTH

## 2012-12-25 LAB — CBC
MCH: 31.7 pg (ref 26.0–34.0)
MCHC: 34.7 g/dL (ref 30.0–36.0)
Platelets: 137 10*3/uL — ABNORMAL LOW (ref 150–400)
RDW: 22.7 % — ABNORMAL HIGH (ref 11.5–15.5)

## 2012-12-25 LAB — PROTIME-INR: Prothrombin Time: 30.7 seconds — ABNORMAL HIGH (ref 11.6–15.2)

## 2012-12-25 MED ORDER — WHITE PETROLATUM GEL
Status: AC
  Administered 2012-12-25: 13:00:00
  Filled 2012-12-25: qty 5

## 2012-12-25 MED ORDER — BOOST / RESOURCE BREEZE PO LIQD
1.0000 | ORAL | Status: DC
  Administered 2012-12-25 – 2012-12-27 (×3): 1 via ORAL

## 2012-12-25 MED ORDER — ALBUMIN HUMAN 25 % IV SOLN
12.5000 g | Freq: Three times a day (TID) | INTRAVENOUS | Status: AC
  Administered 2012-12-25: 12.5 g via INTRAVENOUS
  Filled 2012-12-25 (×2): qty 50

## 2012-12-25 MED ORDER — PRO-STAT SUGAR FREE PO LIQD
30.0000 mL | Freq: Two times a day (BID) | ORAL | Status: DC
  Administered 2012-12-25 – 2012-12-28 (×6): 30 mL via ORAL
  Filled 2012-12-25 (×8): qty 30

## 2012-12-25 MED ORDER — ENSURE COMPLETE PO LIQD
237.0000 mL | ORAL | Status: DC
  Administered 2012-12-25 – 2012-12-27 (×3): 237 mL via ORAL

## 2012-12-25 MED ORDER — ADULT MULTIVITAMIN W/MINERALS CH
1.0000 | ORAL_TABLET | Freq: Every day | ORAL | Status: DC
  Administered 2012-12-25 – 2013-01-04 (×11): 1 via ORAL
  Filled 2012-12-25 (×12): qty 1

## 2012-12-25 MED ORDER — LACTULOSE 10 GM/15ML PO SOLN
10.0000 g | Freq: Two times a day (BID) | ORAL | Status: DC
  Administered 2012-12-25 – 2012-12-26 (×3): 10 g via ORAL
  Filled 2012-12-25 (×4): qty 15

## 2012-12-25 MED ORDER — SODIUM BICARBONATE 8.4 % IV SOLN
INTRAVENOUS | Status: DC
  Administered 2012-12-25 – 2012-12-27 (×3): via INTRAVENOUS
  Filled 2012-12-25 (×8): qty 150

## 2012-12-25 MED ORDER — ALBUMIN HUMAN 5 % IV SOLN
INTRAVENOUS | Status: AC
  Administered 2012-12-25: 12.5 g
  Filled 2012-12-25: qty 250

## 2012-12-25 NOTE — Clinical Social Work Note (Signed)
CSW notified [facility] of [name] [name] that pt has wound VAC so they will be prepared when pt is medically ready for discharge.

## 2012-12-25 NOTE — Progress Notes (Signed)
PULMONARY  / CRITICAL CARE MEDICINE  Name: [name] [name] MRN: [id number] DOB: 10-May-1946    ADMISSION DATE:  Dec 25, 2012 CONSULTATION DATE:  25-Dec-2012  REFERRING MD :  EDP PRIMARY SERVICE: PCCM  CHIEF COMPLAINT:  Septic shock  BRIEF PATIENT DESCRIPTION: 67 y/o female with alcoholic cirrhosis was admitted on 6/18 through the [facility] ED for septic shock in the setting of a recent diagnosis of a "bloodstream infection".    SIGNIFICANT EVENTS: 6/18 admission 6/19 Add solu cortef, placed wound vac  STUDIES: 6/19 CT abd/pelvis >> Rt > Lt pleural effusions, numerous calcified gallstones, small volume ascites, mesenteric edema, diffuse thickening of cecum and ascending colon 6/19 Doppler abd >> no portal vein thrombosis  LINES / TUBES: 6/17 R arm PICC >> 6/18 Lt IJ CVL >>   CULTURES: 6/16 blood >> GPC in clusters/pairs ([facility]) 6/18 urine >>negative  ANTIBIOTICS: 6/14 bactrim >> 6/18 [facility]) 6/17 Cipro >> 6/18 [facility] 6/18 vanc >> 6/18 imipenem >>  SUBJECTIVE:  Feels better.  Denies chest pain/dyspnea/nausea.  Remains on pressors.  Tolerating diet.  Still with significant outpt from wound vac.  VITAL SIGNS: Temp:  [97.5 F (36.4 C)-98 F (36.7 C)] 97.6 F (36.4 C) (06/20 0800) Pulse Rate:  [91-103] 97 (06/20 0100) Resp:  [13-20] 17 (06/20 0000) BP: (89-108)/(39-59) 108/39 mmHg (06/20 0100) SpO2:  [96 %-99 %] 97 % (06/20 0100) HEMODYNAMICS: CVP:  [7 mmHg-16 mmHg] 16 mmHg Room air  INTAKE / OUTPUT: Intake/Output     06/19 0701 - 06/20 0700 06/20 0701 - 06/21 0700   I.V. (mL/kg) 2031.2 (29.7)    Blood     IV Piggyback 200    Total Intake(mL/kg) 2231.2 (32.6)    Urine (mL/kg/hr) 2 (0)    Drains 1250 (0.8)    Stool 2 (0)    Total Output 1254     Net +977.2          Urine Occurrence 3 x    Stool Occurrence 3 x      PHYSICAL EXAMINATION:  Gen: No distress HEENT: Jaundiced PULM: no wheeze CV: regular AB: wound vac in place Ext:  anasarca Derm: R leg wound> very superficial and small skin laceration < 1cm long, no surrounding erythema or warmth Neuro: Alert, normal strength, follows commands   LABS:  Recent Labs Lab 12/25/12 1610 Dec 25, 2012 2040 December 25, 2012 2327 12/24/12 0240 12/24/12 0424 12/24/12 0500 12/25/12 0420  HGB 9.2*  --  8.7*  --   --  7.9* 9.0*  WBC 12.9*  --  13.8*  --   --  12.0* 13.0*  PLT 120*  --  129*  --   --  108* 137*  NA 122*  --   --   --   --  127* 125*  K 5.1  --   --   --   --  4.1 4.3  CL 91*  --   --   --   --  97 99  CO2 18*  --   --   --   --  18* 16*  GLUCOSE 82  --   --   --   --  107* 76  BUN 23  --   --   --   --  22 24*  CREATININE 2.82*  --  2.71*  --   --  2.65* 2.88*  CALCIUM 7.1*  --   --   --   --  7.2* 7.3*  MG  --   --   --   --   --   --  1.9  PHOS  --   --   --   --   --   --  5.7*  AST 137*  --   --   --   --   --  85*  ALT 18  --   --   --   --   --  31  ALKPHOS 117  --   --   --   --   --  124*  BILITOT 15.7*  15.7*  --   --   --   --   --  16.4*  PROT 6.2  --   --   --   --   --  5.8*  ALBUMIN 0.9*  --   --   --   --   --  1.1*  APTT  --  73*  --   --   --   --   --   INR  --  5.02*  --   --   --  2.55* 3.16*  LATICACIDVEN  --  3.2*  --  2.4*  --   --   --   TROPONINI  --  <0.30  --   --   --   --   --   PROCALCITON  --  0.58  --   --   --   --   --   PHART  --   --   --   --  7.405  --   --   PCO2ART  --   --   --   --  26.6*  --   --   PO2ART  --   --   --   --  71.6*  --   --     Recent Labs Lab 12/24/12 1158 12/24/12 1558 12/24/12 2025 12/25/12 0018 12/25/12 0756  GLUCAP 76 71 82 73 76    Imaging: Ct Abdomen Pelvis Wo Contrast  12/24/2012   *RADIOLOGY REPORT*  Clinical Data: Abdominal pain.  Nausea.  Possible abdominal sepsis.  CT ABDOMEN AND PELVIS WITHOUT CONTRAST  Technique:  Multidetector CT imaging of the abdomen and pelvis was performed following the standard protocol without intravenous contrast.  Comparison: CT of the abdomen and  pelvis 11/19/2012.  Findings:  Lung Bases: Moderate right and small left-sided pleural effusions layering dependently with areas of passive atelectasis in the lower lobes of the lungs bilaterally.  Extensive calcifications of the mitral annulus.  Calcifications of the aortic valve. Atherosclerotic calcifications in the left main, left anterior descending, left circumflex and right coronary arteries.  A small amount of pericardial fluid and/or thickening, unlikely to be of hemodynamic significance at this time.  Abdomen/Pelvis:  Numerous small calcified gallstones layering dependently in the gallbladder.  The unenhanced appearance of the liver, pancreas, spleen, bilateral adrenal glands and bilateral kidneys is unremarkable.  There is extensive atherosclerosis throughout the abdominal and pelvic vasculature, without definite aneurysm.  Open midline abdominal wound is noted, presumably healing very secondary intention.  There is a small volume of ascites throughout the abdomen and pelvis, with the largest collection surrounding the liver.  Mild diffuse mesenteric edema with some interloop fluid. Numerous colonic diverticula are noted (presence of ascites limits accurate assessment for acute diverticulitis).  Diffuse thickening of the wall of the cecum and ascending colon.  The multiple borderline and mildly dilated loops of small bowel are noted. However, oral contrast material extends all the way to the terminal ileum, suggesting against frank bowel obstruction.  No pneumoperitoneum identified at this time.  Large amount of low attenuation material in the endometrial cavity, unusual for a patient this age. Large venous collaterals in the left side of the retroperitoneum are grossly similar to the prior study.  Urinary bladder is unremarkable in appearance.  Musculoskeletal: There are no aggressive appearing lytic or blastic lesions noted in the visualized portions of the skeleton.  IMPRESSION: 1.  Multiple borderline  and mildly dilated loops of small bowel are noted.  However, oral contrast material extends all the way to the level of the terminal ileum suggesting against frank bowel obstruction.  This pattern is favored to represent an ileus in this patient.  2.  Thickening of the cecum and ascending colon wall, suspicious for colitis. 3.  Diffuse mesenteric edema and small volume of ascites. 4.  Cholelithiasis.  Gallbladder does appear distended. Unfortunately, secondary to the presence of ascites, accurate assessment for acute cholecystitis is not possible on this examination.  If there is clinical concern for acute cholecystitis, this could be better evaluated with ultrasound examination. 5.  Extensive colonic diverticulosis.  The presence of ascites limits accurate assessment for subtle inflammatory changes related to acute diverticulitis.  Although acute diverticulitis is not strongly favored, it is difficult to exclude in this setting. Clinical correlation is recommended. 6.  Moderate right and small left-sided pleural effusions with dependent passive atelectasis in the lower lobes of the lungs bilaterally. 7. Atherosclerosis, including left main and three-vessel coronary artery disease.   Assessment for potential risk factor modification, dietary therapy or pharmacologic therapy may be warranted, if clinically indicated. 8.  Large amount of low attenuation material in the endometrial canal is unusual in a post menopausal patient.  Non emergent transvaginal ultrasound is recommended for follow up in the near future, has endometrial neoplasm could have a similar appearance. 9.  Healing open midline abdominal wound presumably closing via secondary intension. 10.  Additional incidental findings, as above.   Original Report Authenticated By: Trudie Reed, M.D.   [place] Art/ven Flow Abd Pelv Doppler  12/24/2012   *RADIOLOGY REPORT*  Clinical Data: Worsening hepatic failure and ascites.  DUPLEX ULTRASOUND OF LIVER  Technique:   Color and duplex Doppler ultrasound was performed to evaluate the hepatic in-flow and out-flow vessels.  Comparison:  CT of the abdomen and pelvis on 12/24/2012  Portal Vein Velocities: Main:      41 - 50 cm/sec Right:      16 cm/sec Left:        20 cm/sec  Flow in the main portal vein is in a retrograde direction consistent with hepatofugal flow.  Hepatic Vein Velocities: Right:     Not visualized. cm/sec Middle:  Not visualized. cm/sec Left:       Not visualized. cm/sec  Hepatic Artery Velocity:   247 cm/sec Splenic Vein Velocity:      36 cm/sec  Varices:   None visualized. Ascites:   Small amount of ascites visualized.  Findings: The portal vein flow is away from the liver / hepatofugal.  This is consistent with underlying significant liver disease and portal hypertension.  No evidence of portal vein thrombosis.  The hepatic veins are not definitely visualized by color Doppler.  Flow is detected in the intrahepatic IVC.  A component of hepatic venoocclusive disease cannot be excluded.  Liver echotexture shows a focal rounded hyperechoic area in the high right lobe measuring up to 2 cm in diameter.  This may be a hemangioma or potentially regenerating/dysplastic nodule.  Eventual  correlation with MRI of the abdomen may be beneficial.  The splenic vein is open.  The spleen is not overtly enlarged with estimated volume of 271 ml.  IMPRESSION:  1.  Hepatofugal portal vein flow consistent with underlying significant liver disease.  There is no evidence of portal vein thrombosis. 2.  Lack of visualization of the hepatic veins by color Doppler.  A component of hepatic venoocclusive disease is not excluded.  The intrahepatic IVC is not occluded. 3.  2 cm hyperechoic rounded lesion in the right lobe of the liver. This most likely represents any angioma.  However, given the significant underlying liver disease present, eventual correlation with MRI of the abdomen may be helpful when the patient can tolerate it. 4.  No  overt splenomegaly or evidence of splenic vein thrombosis. 5.  Small amount of ascites in the peritoneal cavity.   Original Report Authenticated By: Irish Lack, M.D.   Dg Chest Port 1 View  12/31/2012   *RADIOLOGY REPORT*  Clinical Data: Central line placement  PORTABLE CHEST - 1 VIEW  Comparison: Acute abdominal series 12/13/2012.  Findings: There is a left-sided internal jugular central venous catheter with tip terminating in the superior cavoatrial junction. There is a right upper extremity PICC with tip terminating in the mid to distal superior vena cava.  Lung volumes are normal. No pneumothorax.  Bibasilar opacities (left greater than right) with which may represent areas of atelectasis and/or consolidation. Small left and trace right pleural effusions.  Pulmonary venous congestion, without frank pulmonary edema.  Borderline cardiomegaly. The patient is rotated to the left on today's exam, resulting in distortion of the mediastinal contours and reduced diagnostic sensitivity and specificity for mediastinal pathology. Atherosclerosis in the thoracic aorta.  IMPRESSION: 1.  Support apparatus, as above. 2. Worsening bibasilar opacities which may reflect increasing areas of atelectasis and/or consolidation. 3.  Small left and trace right-sided pleural effusions. 4.  Pulmonary venous congestion, without frank pulmonary edema.   Original Report Authenticated By: Trudie Reed, M.D.   Dg Abd Acute W/chest  Dec 31, 2012   *RADIOLOGY REPORT*  Clinical Data: 67 year old female with weakness, dizziness, hepatic disease.  ACUTE ABDOMEN SERIES (ABDOMEN 2 VIEW & CHEST 1 VIEW)  Comparison:  Chest radiograph 11/27/2012. CT abdomen and pelvis 11/19/2012.  Findings: Right PICC line remains in place.  Improved lung volumes. Small bilateral pleural effusions.  Stable cardiac size and mediastinal contours.  Visualized tracheal air column is within normal limits.  No pneumothorax or pulmonary edema.  Bibasilar  atelectasis.  No pneumoperitoneum. Nonobstructed bowel gas pattern.Calcified atherosclerosis of the aorta.  Degenerative changes and mild scoliosis in the spine.  IMPRESSION: 1. Nonobstructed bowel gas pattern, no free air.  2.  Small bilateral pleural effusions.   Original Report Authenticated By: Erskine Speed, M.D.     ASSESSMENT / PLAN:  PULMONARY A: Small pleural effusions >> in setting of cirrhosis, ascites, and low albumin state. P:   -oxygen as needed to keep SpO2 > 92% -f/u CXR as needed -no indication for thoracentesis at present  CARDIOVASCULAR A: Septic shock with report of bacteremia from nursing home. Hx of HTN. P:  -even fluid balance -give albumin q12h x 2 on 6/20 -wean off pressors to keep MAP > 65 -hold benazepril, labetalol  RENAL A: Acute renal failure likely from septic shock and ACE inhibitor use >> baseline creatinine 0.79 from 11/26/12.  Hyponatremia in setting of cirrhosis. Non gap acidosis. P:   -continue hemodynamic support -monitor renal fx, urine outpt,  electrolytes -check FeNA -add HCO3 to IV fluid 6/20 >> f/u ABG, BMET  GASTROINTESTINAL A:  ETOH cirrhosis with hypoalbuminemia. 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 Protein calorie malnutrition. P:   -wound care per [facility]CS and wound care team -advance diet per CCS -protonix for SUP  HEMATOLOGIC A: Anemia, thrombocytopenia of critical illness and chronic disease. 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 P:  -f/u CBC, INR -SCD for DVT prevention  INFECTIOUS A: Septic shock >> possible sources are UTI, bacteremia, colitis, and SBP.  Not enough peritoneal fluid to do paracentesis >> significant leakage from wound site. P:   -D3/x vancomycin, imipenem -continue to call [facility] [phone]) for 6/16 blood culture results  ENDOCRINE A:  DM type II. Relative adrenal  insufficiency >> cortisol 12 from 6/18. P:   -SSI -started solu cortef 6/19  NEUROLOGIC A:  Mild hepatic encephalopathy P:   -add lactulose -minimize sedating meds  Updated family at bedside.  CC time 35 minutes.  Coralyn Helling, MD [facility] Pulmonary/Critical Care 12/25/2012, 11:04 AM Pager:  [phone] After 3pm call: [phone]

## 2012-12-25 NOTE — Progress Notes (Signed)
Patient ID: [name] [name], female   DOB: 09/26/1945, 67 y.o.   MRN: [id number]    Subjective: Currently awake and alert. denies abdominal pain. Denies nausea.Deeply jaundiced  CT scan reviewed. Gallstones noted But doubt cholecystitis. Possible right sided colitis.Otherwise just postop changes, and no evidence of intra-abdominal source of sepsis.  Objective: Vital signs in last 24 hours: Temp:  [97.5 F (36.4 C)-98 F (36.7 C)] 97.6 F (36.4 C) (06/20 0800) Pulse Rate:  [91-103] 97 (06/20 0100) Resp:  [13-24] 17 (06/20 0000) BP: (89-108)/(39-60) 108/39 mmHg (06/20 0100) SpO2:  [93 %-99 %] 97 % (06/20 0100) Last BM Date: 12/24/12  Intake/Output from previous day: 06/19 0701 - 06/20 0700 In: 2231.2 [I.V.:2031.2; IV Piggyback:200] Out: 1254 [Urine:2; Drains:1250; Stool:2] Intake/Output this shift:    General appearance: alert. Not agitated. Does not appear toxic, seems jaundice GI: abdomen obese. Soft. Nontender. Midline wound with vac.  Lab Results:   Recent Labs  12/24/12 0500 12/25/12 0420  WBC 12.0* 13.0*  HGB 7.9* 9.0*  HCT 23.6* 25.9*  PLT 108* 137*   BMET  Recent Labs  12/24/12 0500 12/25/12 0420  NA 127* 125*  K 4.1 4.3  CL 97 99  CO2 18* 16*  GLUCOSE 107* 76  BUN 22 24*  CREATININE 2.65* 2.88*  CALCIUM 7.2* 7.3*   PT/INR  Recent Labs  12/24/12 0500 12/25/12 0420  LABPROT 26.2* 30.7*  INR 2.55* 3.16*   ABG  Recent Labs  12/24/12 0424  PHART 7.405  HCO3 16.5*    Studies/Results: Ct Abdomen Pelvis Wo Contrast  12/24/2012   *RADIOLOGY REPORT*  Clinical Data: Abdominal pain.  Nausea.  Possible abdominal sepsis.  CT ABDOMEN AND PELVIS WITHOUT CONTRAST  Technique:  Multidetector CT imaging of the abdomen and pelvis was performed following the standard protocol without intravenous contrast.  Comparison: CT of the abdomen and pelvis 11/19/2012.  Findings:  Lung Bases: Moderate right and small left-sided pleural effusions layering dependently  with areas of passive atelectasis in the lower lobes of the lungs bilaterally.  Extensive calcifications of the mitral annulus.  Calcifications of the aortic valve. Atherosclerotic calcifications in the left main, left anterior descending, left circumflex and right coronary arteries.  A small amount of pericardial fluid and/or thickening, unlikely to be of hemodynamic significance at this time.  Abdomen/Pelvis:  Numerous small calcified gallstones layering dependently in the gallbladder.  The unenhanced appearance of the liver, pancreas, spleen, bilateral adrenal glands and bilateral kidneys is unremarkable.  There is extensive atherosclerosis throughout the abdominal and pelvic vasculature, without definite aneurysm.  Open midline abdominal wound is noted, presumably healing very secondary intention.  There is a small volume of ascites throughout the abdomen and pelvis, with the largest collection surrounding the liver.  Mild diffuse mesenteric edema with some interloop fluid. Numerous colonic diverticula are noted (presence of ascites limits accurate assessment for acute diverticulitis).  Diffuse thickening of the wall of the cecum and ascending colon.  The multiple borderline and mildly dilated loops of small bowel are noted. However, oral contrast material extends all the way to the terminal ileum, suggesting against frank bowel obstruction.  No pneumoperitoneum identified at this time.  Large amount of low attenuation material in the endometrial cavity, unusual for a patient this age. Large venous collaterals in the left side of the retroperitoneum are grossly similar to the prior study.  Urinary bladder is unremarkable in appearance.  Musculoskeletal: There are no aggressive appearing lytic or blastic lesions noted in the visualized portions  of the skeleton.  IMPRESSION: 1.  Multiple borderline and mildly dilated loops of small bowel are noted.  However, oral contrast material extends all the way to the level  of the terminal ileum suggesting against frank bowel obstruction.  This pattern is favored to represent an ileus in this patient.  2.  Thickening of the cecum and ascending colon wall, suspicious for colitis. 3.  Diffuse mesenteric edema and small volume of ascites. 4.  Cholelithiasis.  Gallbladder does appear distended. Unfortunately, secondary to the presence of ascites, accurate assessment for acute cholecystitis is not possible on this examination.  If there is clinical concern for acute cholecystitis, this could be better evaluated with ultrasound examination. 5.  Extensive colonic diverticulosis.  The presence of ascites limits accurate assessment for subtle inflammatory changes related to acute diverticulitis.  Although acute diverticulitis is not strongly favored, it is difficult to exclude in this setting. Clinical correlation is recommended. 6.  Moderate right and small left-sided pleural effusions with dependent passive atelectasis in the lower lobes of the lungs bilaterally. 7. Atherosclerosis, including left main and three-vessel coronary artery disease.   Assessment for potential risk factor modification, dietary therapy or pharmacologic therapy may be warranted, if clinically indicated. 8.  Large amount of low attenuation material in the endometrial canal is unusual in a post menopausal patient.  Non emergent transvaginal ultrasound is recommended for follow up in the near future, has endometrial neoplasm could have a similar appearance. 9.  Healing open midline abdominal wound presumably closing via secondary intension. 10.  Additional incidental findings, as above.   Original Report Authenticated By: Trudie Reed, M.D.   [place] Art/ven Flow Abd Pelv Doppler  12/24/2012   *RADIOLOGY REPORT*  Clinical Data: Worsening hepatic failure and ascites.  DUPLEX ULTRASOUND OF LIVER  Technique:  Color and duplex Doppler ultrasound was performed to evaluate the hepatic in-flow and out-flow vessels.   Comparison:  CT of the abdomen and pelvis on 12/24/2012  Portal Vein Velocities: Main:      41 - 50 cm/sec Right:      16 cm/sec Left:        20 cm/sec  Flow in the main portal vein is in a retrograde direction consistent with hepatofugal flow.  Hepatic Vein Velocities: Right:     Not visualized. cm/sec Middle:  Not visualized. cm/sec Left:       Not visualized. cm/sec  Hepatic Artery Velocity:   247 cm/sec Splenic Vein Velocity:      36 cm/sec  Varices:   None visualized. Ascites:   Small amount of ascites visualized.  Findings: The portal vein flow is away from the liver / hepatofugal.  This is consistent with underlying significant liver disease and portal hypertension.  No evidence of portal vein thrombosis.  The hepatic veins are not definitely visualized by color Doppler.  Flow is detected in the intrahepatic IVC.  A component of hepatic venoocclusive disease cannot be excluded.  Liver echotexture shows a focal rounded hyperechoic area in the high right lobe measuring up to 2 cm in diameter.  This may be a hemangioma or potentially regenerating/dysplastic nodule.  Eventual correlation with MRI of the abdomen may be beneficial.  The splenic vein is open.  The spleen is not overtly enlarged with estimated volume of 271 ml.  IMPRESSION:  1.  Hepatofugal portal vein flow consistent with underlying significant liver disease.  There is no evidence of portal vein thrombosis. 2.  Lack of visualization of the hepatic veins by color  Doppler.  A component of hepatic venoocclusive disease is not excluded.  The intrahepatic IVC is not occluded. 3.  2 cm hyperechoic rounded lesion in the right lobe of the liver. This most likely represents any angioma.  However, given the significant underlying liver disease present, eventual correlation with MRI of the abdomen may be helpful when the patient can tolerate it. 4.  No overt splenomegaly or evidence of splenic vein thrombosis. 5.  Small amount of ascites in the peritoneal  cavity.   Original Report Authenticated By: Irish Lack, M.D.   Dg Chest Port 1 View  Jan 17, 2013   *RADIOLOGY REPORT*  Clinical Data: Central line placement  PORTABLE CHEST - 1 VIEW  Comparison: Acute abdominal series 12/13/2012.  Findings: There is a left-sided internal jugular central venous catheter with tip terminating in the superior cavoatrial junction. There is a right upper extremity PICC with tip terminating in the mid to distal superior vena cava.  Lung volumes are normal. No pneumothorax.  Bibasilar opacities (left greater than right) with which may represent areas of atelectasis and/or consolidation. Small left and trace right pleural effusions.  Pulmonary venous congestion, without frank pulmonary edema.  Borderline cardiomegaly. The patient is rotated to the left on today's exam, resulting in distortion of the mediastinal contours and reduced diagnostic sensitivity and specificity for mediastinal pathology. Atherosclerosis in the thoracic aorta.  IMPRESSION: 1.  Support apparatus, as above. 2. Worsening bibasilar opacities which may reflect increasing areas of atelectasis and/or consolidation. 3.  Small left and trace right-sided pleural effusions. 4.  Pulmonary venous congestion, without frank pulmonary edema.   Original Report Authenticated By: Trudie Reed, M.D.   Dg Abd Acute W/chest  01-17-13   *RADIOLOGY REPORT*  Clinical Data: 67 year old female with weakness, dizziness, hepatic disease.  ACUTE ABDOMEN SERIES (ABDOMEN 2 VIEW & CHEST 1 VIEW)  Comparison:  Chest radiograph 11/27/2012. CT abdomen and pelvis 11/19/2012.  Findings: Right PICC line remains in place.  Improved lung volumes. Small bilateral pleural effusions.  Stable cardiac size and mediastinal contours.  Visualized tracheal air column is within normal limits.  No pneumothorax or pulmonary edema.  Bibasilar atelectasis.  No pneumoperitoneum. Nonobstructed bowel gas pattern.Calcified atherosclerosis of the aorta.   Degenerative changes and mild scoliosis in the spine.  IMPRESSION: 1. Nonobstructed bowel gas pattern, no free air.  2.  Small bilateral pleural effusions.   Original Report Authenticated By: Erskine Speed, M.D.    Anti-infectives: Anti-infectives   Start     Dose/Rate Route Frequency Ordered Stop   12/24/12 1500  imipenem-cilastatin (PRIMAXIN) 250 mg in sodium chloride 0.9 % 100 mL IVPB     250 mg 200 mL/hr over 30 Minutes Intravenous 3 times per day 12/24/12 1436     17-Jan-2013 1830  vancomycin (VANCOCIN) IVPB 1000 mg/200 mL premix     1,000 mg 200 mL/hr over 60 Minutes Intravenous Every 48 hours 01-17-2013 1820     01-17-2013 1830  imipenem-cilastatin (PRIMAXIN) 250 mg in sodium chloride 0.9 % 100 mL IVPB  Status:  Discontinued     250 mg 200 mL/hr over 30 Minutes Intravenous Every 12 hours 01/17/2013 1820 12/24/12 1436      Assessment/Plan:  Hypotension, presumed sepsis. There is no clinical or radiographic evidence of surgical abdominal  source at this time.  Question right sided colitis radiographically Full liquids toady, possibly soft tonight  Wound care for nonhealing midline wound., now with wound vac  Hepatic failure with jaundice, encephalopathy and coagulopathy.   severe protein  calorie malnutrition  history jejunal perforation with diffuse peritonitis, status post laparotomy and primary repair 11/13/2012, , subsequent fascial closure, wound vac now.   Short-term prognosis fair to guarded. Long-term prognosis is poor due to hepatic failure, Possible hepatorenal syndrome.  Full liquids today possibly soft for dinner, ambulate   LOS: 2 days   Lamiracle Chaidez, Tavi 8:35 AM  Office:   [phone]  12/25/2012

## 2012-12-25 NOTE — Telephone Encounter (Signed)
Error opening encounter

## 2012-12-25 NOTE — Procedures (Signed)
Arterial Catheter Insertion Procedure Note [name] [name] [id number] 21-Dec-1945  Procedure: Insertion of Arterial Catheter  Indications: Blood pressure monitoring  Procedure Details Consent: Consent to the procedure was explained to the patient and she showed full understanding. Unable to sign consent said her husband does all the signing. He is unable to be here at this time. Time Out: Verified patient identification, verified procedure, site/side was marked, verified correct patient position, special equipment/implants available, medications/allergies/relevent history reviewed, required imaging and test results available.  Performed  Maximum sterile technique was used including antiseptics, gloves, gown, hand hygiene and sheet. Skin prep: Chlorhexidine; local anesthetic administered 22 gauge catheter was inserted into left radial artery using the Seldinger technique. Assisted by Thelma Barge Alamour RRT,RCP.  Evaluation Blood flow good; BP tracing good. Complications: No apparent complications.   Erskine Speed 12/25/2012

## 2012-12-25 NOTE — Progress Notes (Signed)
eLink Physician-Brief Progress Note Patient Name: [name] [name] DOB: 1946-06-23 MRN: [id number]  Date of Service  12/25/2012   HPI/Events of Note  Patient remains hypotensive with MAP of less than 65 on NE gtt for BP support.  Concern for how accurate cuff pressures are.  Patient does now have foley and it is unclear how much UOP the patient has.   eICU Interventions  Plan: Order to insert aline for HD monitoring.   Intervention Category Intermediate Interventions: Hypotension - evaluation and management  DETERDING,Herbert 12/25/2012, 1:05 AM

## 2012-12-25 NOTE — Progress Notes (Addendum)
INITIAL NUTRITION ASSESSMENT  DOCUMENTATION CODES Per approved criteria  -Severe malnutrition in the context of chronic illness   INTERVENTION: 1. Monitor magnesium, potassium, and phosphorus daily for at least 3 days, MD to replete as needed, as pt is at risk for refeeding syndrome given severe malnutrition. 2. Resource Breeze po daily, each supplement provides 250 kcal and 9 grams of protein. 3. Ensure Complete po daily, each supplement provides 350 kcal and 13 grams of protein. 4. 30 ml Prostat po BID, each supplement provides 100 kcal and 15 grams protein. 5. MVI daily 6. RD to continue to follow nutrition care plan.  NUTRITION DIAGNOSIS: Inadequate oral intake related to poor appetite as evidenced by pt report, dietary recall and ongoing weight loss.   Goal: Intake to meet >90% of estimated nutrition needs.  Monitor:  weight trends, lab trends, I/O's, PO intake, supplement tolerance  Reason for Assessment: Low Braden  67 y.o. female  Admitting Dx: Septic shock  ASSESSMENT: Admitted with alcoholic cirrhosis. From [facility]. Note pt was admitted to [facility] 5/7 - 5/29 for small bowel perforation.  Pt is deeply jaundiced, has small volume ascites, and mesenteric edema. Has nonhealing midline wound. Wound VAC placed 6/19. Per WOC RN, pt with heavy ascites draining from wound, also with necrotic tissue in wound.  Advanced to Full Liquids today. Per surgery - possible advancement to soft diet tonight.   Pt with significant wt loss since last admission, per chart review, pt with 28% wt loss x 1 month. Pt is unable to confirm her usual body weight with me, stating that "I don't know what it was, but it's too much." She confirms that she has had increased weakness. This RD called [facility] and spoke with RN who was familiar with patient. Per RN, pt was there for a little less than a month and average meal intake of all meals was 25%. Discussed oral nutrition  supplements with patient and she said that she has no preference, will add Ensure and Breeze daily.  Pt meets criteria for severe MALNUTRITION in the context of chronic illness as evidenced by 28% wt loss x 1 month and intake of <75% x at least 1 month.  Pt is at risk for refeeding syndrome. Most recent potassium and magnesium WNL. Phosphorus elevated at 5.7.  Height: Ht Readings from Last 1 Encounters:  2012/12/25 5' 4.96" (1.65 m)    Weight: Wt Readings from Last 1 Encounters:  2012-12-25 151 lb 0.2 oz (68.5 kg)    Ideal Body Weight: 125 lb  % Ideal Body Weight: 121%  Wt Readings from Last 20 Encounters:  12-25-2012 151 lb 0.2 oz (68.5 kg)  12/25/2012 151 lb (68.493 kg)  12/22/12 220 lb (99.791 kg)  12/08/12 222 lb (100.699 kg)  11/26/12 225 lb 8.5 oz (102.3 kg)  11/26/12 225 lb 8.5 oz (102.3 kg)  11/26/12 225 lb 8.5 oz (102.3 kg)  10/05/12 220 lb (99.791 kg)  10/05/12 220 lb (99.791 kg)  09/28/12 205 lb (92.987 kg)  09/28/12 205 lb (92.987 kg)  09/25/12 212 lb (96.163 kg)  09/21/12 200 lb (90.719 kg)  09/21/12 200 lb (90.719 kg)  09/21/12 200 lb (90.719 kg)  09/21/12 200 lb (90.719 kg)  09/01/12 206 lb (93.441 kg)  08/14/12 212 lb (96.163 kg)  02/21/12 190 lb (86.183 kg)  02/11/12 184 lb 12.8 oz (83.825 kg)    Usual Body Weight: 200 - 220 lb  % Usual Body Weight: 72%; 28% wt loss x  1 month  BMI:  Body mass index is 25.16 kg/(m^2). Overweight.  Estimated Nutritional Needs: Kcal: 1975 - 2100 Protein: at least 90 g daily Fluid: per team  Skin:  Nonhealing midline wound; VAC placed 6/19  Diet Order: Full Liquid  EDUCATION NEEDS: -No education needs identified at this time   Intake/Output Summary (Last 24 hours) at 12/25/12 1044 Last data filed at 12/25/12 0400  Gross per 24 hour  Intake 1674.87 ml  Output   1252 ml  Net 422.87 ml    Last BM: 6/19  Labs:   Recent Labs Lab 12/26/12 1610 12/26/12 2327 12/24/12 0500 12/25/12 0420  NA 122*  --  127*  125*  K 5.1  --  4.1 4.3  CL 91*  --  97 99  CO2 18*  --  18* 16*  BUN 23  --  22 24*  CREATININE 2.82* 2.71* 2.65* 2.88*  CALCIUM 7.1*  --  7.2* 7.3*  MG  --   --   --  1.9  PHOS  --   --   --  5.7*  GLUCOSE 82  --  107* 76    CBG (last 3)   Recent Labs  12/24/12 2025 12/25/12 0018 12/25/12 0756  GLUCAP 82 73 76    Scheduled Meds: . sodium chloride  1,000 mL Intravenous Once  . folic acid  1 mg Oral Daily  . heparin  5,000 Units Subcutaneous Q8H  . hydrocortisone sod succinate (SOLU-CORTEF) inj  50 mg Intravenous Q6H  . imipenem-cilastatin  250 mg Intravenous Q8H  . insulin aspart  0-9 Units Subcutaneous TID WC  . levothyroxine  25 mcg Oral QAC breakfast  . pantoprazole  40 mg Oral Q1200  . sodium chloride  3 mL Intravenous Q12H  . thiamine  100 mg Oral Daily  . vancomycin  1,000 mg Intravenous Q48H    Continuous Infusions: . sodium chloride 1,000 mL (12/24/12 1701)  . norepinephrine (LEVOPHED) Adult infusion 14 mcg/min (12/25/12 1610)    Past Medical History  Diagnosis Date  . Hypertension   . H/O brain surgery   . Aneurysm of anterior cerebral artery   . Hernia   . Anxiety   . Stroke   . Anemia   . Arthritis   . DM2 (diabetes mellitus, type 2) Dec 26, 2012    Past Surgical History  Procedure Laterality Date  . Laparotomy  01/22/2012    Procedure: EXPLORATORY LAPAROTOMY;  Surgeon: Emelia Loron, MD;  Location: [facility] OR;  Service: General;  Laterality: N/A;  lysis of adhesions  . Hernia repair    . Esophagogastroduodenoscopy N/A 09/19/2012    Procedure: ESOPHAGOGASTRODUODENOSCOPY (EGD);  Surgeon: Charna Anevay, MD;  Location: [facility] ENDOSCOPY;  Service: Endoscopy;  Laterality: N/A;  . Colonoscopy N/A 09/20/2012    Procedure: COLONOSCOPY;  Surgeon: Charna Cyndra, MD;  Location: [facility] ENDOSCOPY;  Service: Endoscopy;  Laterality: N/A;  . Givens capsule study N/A 09/21/2012    Procedure: GIVENS CAPSULE STUDY;  Surgeon: Charna Haja, MD;  Location: [facility] ENDOSCOPY;  Service:  Endoscopy;  Laterality: N/A;  . Enteroscopy N/A 09/28/2012    Procedure: ENTEROSCOPY;  Surgeon: Rachael Fee, MD;  Location: [facility] ENDOSCOPY;  Service: Endoscopy;  Laterality: N/A;  . Upper gastrointestinal endoscopy  10/05/12  . Enteroscopy N/A 10/05/2012    Procedure: ENTEROSCOPY;  Surgeon: Meryl Dare, MD;  Location: [facility] ENDOSCOPY;  Service: Endoscopy;  Laterality: N/A;  . Laparotomy N/A 11/12/2012    Procedure: EXPLORATORY LAPAROTOMY  with repair perforated small bowel,  abdominal wall debridement, application of wound vac dressing;  Surgeon: Ernestene Mention, MD;  Location: [facility] ORS;  Service: General;  Laterality: N/A;  . Wound debridement  11/14/2012    Procedure: Washout Abdominal Wound, debridement of Fascia, Partial Closure;  Surgeon: Ardeth Sportsman, MD;  Location: [facility] ORS;  Service: General;;    Jarold Motto MS, RD, LDN Pager: [phone] After-hours pager: [phone]

## 2012-12-25 NOTE — Progress Notes (Signed)
Stay on fulls  [name] [name]. Andrey Campanile, MD, FACS General, Bariatric, & Minimally Invasive Surgery [facility], [place]

## 2012-12-25 NOTE — ED Provider Notes (Signed)
Medical screening examination/treatment/procedure(s) were conducted as a shared visit with non-physician practitioner(s) and myself.  I personally evaluated the patient during the encounter.  EKG reveals normal sinus rhythm rate of 91. Further Documentation by physician's assistant  Donnetta Hutching, MD 12/25/12 [phone]

## 2012-12-25 NOTE — ED Provider Notes (Signed)
Medical screening examination/treatment/procedure(s) were conducted as a shared visit with non-physician practitioner(s) and myself.  I personally evaluated the patient during the encounter.    Patient is very complicated. She is jaundiced, hypotensive.  We'll hydrate, start IV antibiotics, consult critical care to admit.  Patient showed no evidence of hypo perfusion  Donnetta Hutching, MD 12/25/12 1038

## 2012-12-26 LAB — URINE CULTURE

## 2012-12-26 LAB — GLUCOSE, CAPILLARY: Glucose-Capillary: 182 mg/dL — ABNORMAL HIGH (ref 70–99)

## 2012-12-26 LAB — COMPREHENSIVE METABOLIC PANEL
BUN: 28 mg/dL — ABNORMAL HIGH (ref 6–23)
CO2: 15 mEq/L — ABNORMAL LOW (ref 19–32)
Calcium: 7.7 mg/dL — ABNORMAL LOW (ref 8.4–10.5)
Chloride: 99 mEq/L (ref 96–112)
Creatinine, Ser: 2.98 mg/dL — ABNORMAL HIGH (ref 0.50–1.10)
GFR calc non Af Amer: 16 mL/min — ABNORMAL LOW (ref >90)
Total Bilirubin: 17.6 mg/dL — ABNORMAL HIGH (ref 0.3–1.2)

## 2012-12-26 LAB — PROTIME-INR
INR: 3.21 — ABNORMAL HIGH (ref 0.00–1.49)
Prothrombin Time: 31.1 seconds — ABNORMAL HIGH (ref 11.6–15.2)

## 2012-12-26 LAB — CREATININE, URINE, RANDOM: Creatinine, Urine: 94.54 mg/dL

## 2012-12-26 LAB — CBC
Hemoglobin: 8.3 g/dL — ABNORMAL LOW (ref 12.0–15.0)
Platelets: 132 10*3/uL — ABNORMAL LOW (ref 150–400)
RBC: 2.62 MIL/uL — ABNORMAL LOW (ref 3.87–5.11)
WBC: 14.6 10*3/uL — ABNORMAL HIGH (ref 4.0–10.5)

## 2012-12-26 LAB — VANCOMYCIN, RANDOM: Vancomycin Rm: 16.8 ug/mL

## 2012-12-26 MED ORDER — IMIPENEM-CILASTATIN 250 MG IV SOLR
250.0000 mg | Freq: Four times a day (QID) | INTRAVENOUS | Status: DC
  Administered 2012-12-26 – 2012-12-28 (×8): 250 mg via INTRAVENOUS
  Filled 2012-12-26 (×11): qty 250

## 2012-12-26 MED ORDER — INSULIN ASPART 100 UNIT/ML ~~LOC~~ SOLN
0.0000 [IU] | Freq: Three times a day (TID) | SUBCUTANEOUS | Status: DC
  Administered 2012-12-26 – 2012-12-27 (×3): 3 [IU] via SUBCUTANEOUS
  Administered 2012-12-27 – 2012-12-28 (×3): 2 [IU] via SUBCUTANEOUS

## 2012-12-26 MED ORDER — PHYTONADIONE 5 MG PO TABS
10.0000 mg | ORAL_TABLET | Freq: Once | ORAL | Status: AC
  Administered 2012-12-26: 10 mg via ORAL
  Filled 2012-12-26: qty 2

## 2012-12-26 MED ORDER — MIDODRINE HCL 5 MG PO TABS
5.0000 mg | ORAL_TABLET | Freq: Three times a day (TID) | ORAL | Status: DC
Start: 2012-12-26 — End: 2013-01-04
  Administered 2012-12-26 – 2013-01-04 (×27): 5 mg via ORAL
  Filled 2012-12-26 (×30): qty 1

## 2012-12-26 MED ORDER — LACTULOSE 10 GM/15ML PO SOLN
10.0000 g | Freq: Every day | ORAL | Status: DC
  Administered 2012-12-27 – 2013-01-03 (×7): 10 g via ORAL
  Filled 2012-12-26 (×9): qty 15

## 2012-12-26 MED ORDER — ALBUMIN HUMAN 25 % IV SOLN
12.5000 g | Freq: Two times a day (BID) | INTRAVENOUS | Status: AC
  Administered 2012-12-26 (×2): 12.5 g via INTRAVENOUS
  Filled 2012-12-26 (×2): qty 50

## 2012-12-26 MED ORDER — VANCOMYCIN HCL IN DEXTROSE 1-5 GM/200ML-% IV SOLN
1000.0000 mg | INTRAVENOUS | Status: DC
  Administered 2012-12-26 – 2012-12-28 (×3): 1000 mg via INTRAVENOUS
  Filled 2012-12-26 (×4): qty 200

## 2012-12-26 MED ORDER — SODIUM CHLORIDE 0.9 % IV BOLUS (SEPSIS)
1000.0000 mL | Freq: Once | INTRAVENOUS | Status: AC
  Administered 2012-12-26: 1000 mL via INTRAVENOUS

## 2012-12-26 NOTE — Progress Notes (Signed)
Patient has very low out put and low bicarb. Brought to the attention of MD. No new order.

## 2012-12-26 NOTE — Progress Notes (Signed)
PULMONARY  / CRITICAL CARE MEDICINE  Name: [name] [name] MRN: [id number] DOB: Jul 30, 1945    ADMISSION DATE:  2013/01/07 CONSULTATION DATE:  2013/01/07  REFERRING MD :  EDP PRIMARY SERVICE: PCCM  CHIEF COMPLAINT:  Septic shock  BRIEF PATIENT DESCRIPTION: 67 y/o female with alcoholic cirrhosis was admitted on 6/18 through the [facility] ED for septic shock in the setting of a recent diagnosis of a "bloodstream infection".    SIGNIFICANT EVENTS: 6/18 admission 6/19 Add solu cortef, placed wound vac 6/20 Albumin given 6/21 Albumin given, added midorine  STUDIES: 6/19 CT abd/pelvis >> Rt > Lt pleural effusions, numerous calcified gallstones, small volume ascites, mesenteric edema, diffuse thickening of cecum and ascending colon 6/19 Doppler abd >> no portal vein thrombosis  LINES / TUBES: 6/17 R arm PICC >> 6/18 Lt IJ CVL >>   CULTURES: 6/16 blood >> [facility] in clusters[facility]pairs ([facility]) 6/18 urine >>negative  ANTIBIOTICS: 6/14 bactrim >> 6/18 [facility]) 6/17 Cipro >> 6/18 [facility] 6/18 vanc >> 6/18 imipenem >>  SUBJECTIVE:  Feels better.  Still has increased outpt from wound vac.  Tolerating diet.  VITAL SIGNS: Temp:  [97.2 F (36.2 C)-97.8 F (36.6 C)] 97.4 F (36.3 C) (06/21 0800) Pulse Rate:  [93-101] 93 (06/21 0739) Resp:  [16-33] 18 (06/21 0600) SpO2:  [95 %-98 %] 97 % (06/21 0739) Weight:  [218 lb 11.1 oz (99.2 kg)] 218 lb 11.1 oz (99.2 kg) (06/21 0400) HEMODYNAMICS: CVP:  [1 mmHg-11 mmHg] 11 mmHg Room air  INTAKE / OUTPUT: Intake/Output     06/20 0701 - 06/21 0700 06/21 0701 - 06/22 0700   I.V. (mL/kg) 1262.6 (12.7)    IV Piggyback 800    Total Intake(mL/kg) 2062.6 (20.8)    Urine (mL/kg/hr) 260 (0.1) 205 (0.6)   Drains 2050 (0.9)    Stool 1 (0)    Total Output 2311 205   Net -248.4 -205          PHYSICAL EXAMINATION:  Gen: No distress HEENT: Jaundiced PULM: no wheeze CV: regular AB: wound vac in place Ext: anasarca Derm: R leg  wound> very superficial and small skin laceration < 1cm long, no surrounding erythema or warmth Neuro: Alert, normal strength, follows commands   LABS:  Recent Labs Lab 07-Jan-2013 1610  2013/01/07 2040  12/24/12 0240 12/24/12 0424 12/24/12 0500 12/25/12 0420 12/25/12 1900 12/25/12 2140 12/26/12 0400  HGB 9.2*  --   --   < >  --   --  7.9* 9.0*  --   --  8.3*  WBC 12.9*  --   --   < >  --   --  12.0* 13.0*  --   --  14.6*  PLT 120*  --   --   < >  --   --  108* 137*  --   --  132*  NA 122*  --   --   --   --   --  127* 125* 126*  --  128*  K 5.1  --   --   --   --   --  4.1 4.3 3.8  --  3.6  CL 91*  --   --   --   --   --  97 99 100  --  99  CO2 18*  --   --   --   --   --  18* 16* 13*  --  15*  GLUCOSE 82  --   --   --   --   --  107* 76 244*  --  221*  BUN 23  --   --   --   --   --  22 24* 27*  --  28*  CREATININE 2.82*  --   --   < >  --   --  2.65* 2.88* 2.98*  --  2.98*  CALCIUM 7.1*  --   --   --   --   --  7.2* 7.3* 7.4*  --  7.7*  MG  --   --   --   --   --   --   --  1.9  --   --   --   PHOS  --   --   --   --   --   --   --  5.7*  --   --   --   AST 137*  --   --   --   --   --   --  85*  --   --  87*  ALT 18  --   --   --   --   --   --  31  --   --  38*  ALKPHOS 117  --   --   --   --   --   --  124*  --   --  130*  BILITOT 15.7*  15.7*  --   --   --   --   --   --  16.4*  --   --  17.6*  PROT 6.2  --   --   --   --   --   --  5.8*  --   --  5.7*  ALBUMIN 0.9*  --   --   --   --   --   --  1.1*  --   --  1.4*  APTT  --   --  73*  --   --   --   --   --   --   --   --   INR  --   < > 5.02*  --   --   --  2.55* 3.16*  --   --  3.21*  LATICACIDVEN  --   --  3.2*  --  2.4*  --   --   --   --   --   --   TROPONINI  --   --  <0.30  --   --   --   --   --   --   --   --   PROCALCITON  --   --  0.58  --   --   --   --   --   --   --   --   PHART  --   --   --   --   --  7.405  --   --   --  7.365  --   PCO2ART  --   --   --   --   --  26.6*  --   --   --  23.5*  --    PO2ART  --   --   --   --   --  71.6*  --   --   --  74.0*  --   < > = values in this interval not displayed.  Recent Labs Lab 12/25/12 0756 12/25/12 1142 12/25/12 1625 12/25/12 2016 12/26/12 4540  GLUCAP 76 90 158* 206* 182*    Imaging: No results found.   ASSESSMENT / PLAN:  PULMONARY A: Small pleural effusions >> in setting of cirrhosis, ascites, and low albumin state. P:   -oxygen as needed to keep SpO2 > 92% -f/u CXR as needed -no indication for thoracentesis at present  CARDIOVASCULAR A: Septic shock with report of bacteremia from nursing home. Hx of HTN. P:  -even fluid balance -give albumin q12h x 2 on 6/21 -wean off pressors to keep MAP > 65 -hold benazepril, labetalol  RENAL A: Acute renal failure likely from septic shock and ACE inhibitor use >> baseline creatinine 0.79 from 11/26/12.  Hyponatremia in setting of cirrhosis. Non gap acidosis. P:   -continue hemodynamic support -monitor renal fx, urine outpt, electrolytes -check FeNA >> not sent when previously ordered -add HCO3 to IV fluid 6/20 >> continue and f/u ABG, BMET -add midodrine 6/21  GASTROINTESTINAL A:  ETOH cirrhosis with hypoalbuminemia. 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 Protein calorie malnutrition. P:   -wound care per CCS and wound care team -advance diet per CCS -protonix for SUP  HEMATOLOGIC A: Anemia, thrombocytopenia of critical illness and chronic disease. 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 P:  -f/u CBC, INR -SCD for DVT prevention  INFECTIOUS A: Septic shock >> possible sources are UTI, bacteremia, colitis, and SBP.  Not enough peritoneal fluid to do paracentesis >> significant leakage from wound site. P:   -D4/x vancomycin, imipenem -continue to call [facility] [phone]) for 6/16 blood culture results  ENDOCRINE A:  DM type  II. Relative adrenal insufficiency >> cortisol 12 from 6/18. P:   -SSI -started solu cortef 6/19  NEUROLOGIC A:  Mild hepatic encephalopathy P:   -continue lactulose -minimize sedating meds  CC time 35 minutes.  Coralyn Helling, MD [facility] Pulmonary/Critical Care 12/26/2012, 10:14 AM Pager:  [phone] After 3pm call: [phone]

## 2012-12-26 NOTE — Consult Note (Addendum)
ANTIBIOTIC CONSULT NOTE - Follow-up  Pharmacy Consult for Vancomycin + Primaxin Indication: ascites  Allergies  Allergen Reactions  . Erythromycin Swelling  . Etomidate     Myoclonus side effect pronounced  . Cephalexin Rash  . Penicillins Rash    Patient Measurements: Height: 5' 4.96" (165 cm) Weight: 218 lb 11.1 oz (99.2 kg) IBW/kg (Calculated) : 56.91  Vital Signs: Temp: 97.4 F (36.3 C) (06/21 1200) Temp src: Oral (06/21 0800) Pulse Rate: 96 (06/21 1000) Intake/Output from previous day: 06/20 0701 - 06/21 0700 In: 2072.6 [I.V.:1272.6; IV Piggyback:800] Out: 2311 [Urine:260; Drains:2050; Stool:1] Intake/Output from this shift: Total I/O In: 200.7 [I.V.:200.7] Out: 260 [Urine:260]  Labs:  Recent Labs  12/24/12 0500 12/25/12 0420 12/25/12 1900 12/26/12 0400 12/26/12 1055  WBC 12.0* 13.0*  --  14.6*  --   HGB 7.9* 9.0*  --  8.3*  --   PLT 108* 137*  --  132*  --   LABCREA  --   --   --   --  94.54  CREATININE 2.65* 2.88* 2.98* 2.98*  --    Estimated Creatinine Clearance: 22.2 ml/min (by C-G formula based on Cr of 2.98).  Assessment: 17 yoF with history of ETOH abuse / cirrhosis s/p emergent laparotomy for jejunal perforation 11/12/12 then admitted from [facility] office during post-op follow up visit for septic shock.  Noted to have ARF with renal function stable with SCr 2.98>CrCl~22 ml/min.  WBC inc at 14.6, afebrile today.  UOP minimal at 0.1 ml/kg/hr yesterday.  Of note, new weight recorded today of 99.2 kg which is markedly different from previous wt of 68 kg recorded earlier this admission (6/18).  At my request, RN re-weighted patient, confirming wt of 99.2 kg.  Patient reports weight of 86 kg prior to admission.   This evening, vancomycin random level reported as 16.8 at 1945 (~24h after last dose).  6/18 vanc >> 6/18 primaxin>> 6/17 cipro>6/18 @ [facility] 6/14 bactrim>6/18@ [facility]  6/20 Urine: NEG 6/18 MRSA PCR neg 6/18  Urine: NEG 6/16 Bld: GPC in clusters/pairs@ [facility]  Goal of Therapy:  Vancomycin trough level 15-20 mcg/ml Appropriate primaxin dosing  Plan:  - Change Vancomycin to 1g IV q24h - Follow up SCr, UOP, cultures, clinical course and adjust as clinically indicated - Suggest trough at Css, if treatment to continue  Hillside L. Illene Bolus, PharmD, BCPS Clinical Pharmacist Pager: [phone] Pharmacy: [phone] 12/26/2012 1:30 PM

## 2012-12-26 NOTE — Progress Notes (Signed)
eLink Physician Progress Note and Electrolyte Replacement  Patient Name: [name] [name] DOB: 10/26/1945 MRN: [id number]  Date of Service  12/26/2012   HPI/Events of Note    Recent Labs Lab 12/17/2012 1610 12/29/2012 2327 12/24/12 0500 12/25/12 0420 12/25/12 1900 12/26/12 0400  NA 122*  --  127* 125* 126* 128*  K 5.1  --  4.1 4.3 3.8 3.6  CL 91*  --  97 99 100 99  CO2 18*  --  18* 16* 13* 15*  GLUCOSE 82  --  107* 76 244* 221*  BUN 23  --  22 24* 27* 28*  CREATININE 2.82* 2.71* 2.65* 2.88* 2.98* 2.98*  CALCIUM 7.1*  --  7.2* 7.3* 7.4* 7.7*  MG  --   --   --  1.9  --   --   PHOS  --   --   --  5.7*  --   --     Estimated Creatinine Clearance: 22.2 ml/min (by C-G formula based on Cr of 2.98).  Intake/Output     06/20 0701 - 06/21 0700 06/21 0701 - 06/22 0700   I.V. (mL/kg) 1272.6 (12.8) 200.7 (2)   IV Piggyback 800 100   Total Intake(mL/kg) 2072.6 (20.9) 300.7 (3)   Urine (mL/kg/hr) 260 (0.1) 275 (0.3)   Drains 2050 (0.9) 480 (0.5)   Stool 1 (0)    Total Output 2311 755   Net -238.4 -454.3         - I/O DETAILED x 24h    Total I/O In: 300.7 [I.V.:200.7; IV Piggyback:100] Out: 755 [Urine:275; Drains:480] - I/O THIS SHIFT    ASSESSMENT RN caling with low ur op Lot of output through GI wound vac CVP 10 this AM Lot of bowel movements +  Intravascular dryness  eICURN Interventions  1L fluid bolus   ASSESSMENT: MAJOR ELECTROLYTE      Dr. Kalman Shan, M.D., [facility].C.P Pulmonary and Critical Care Medicine Staff Physician [facility] Pulmonary and Critical Care Pager: [phone], If no answer or between  15:00h - 7:00h: call [phone]  12/26/2012 5:21 PM

## 2012-12-26 NOTE — Progress Notes (Signed)
Patient ID: [name] [name], female   DOB: 1945-07-14, 67 y.o.   MRN: [id number]    Subjective: Denies pain or other C/O today  Objective: Vital signs in last 24 hours: Temp:  [97.2 F (36.2 C)-97.8 F (36.6 C)] 97.4 F (36.3 C) (06/21 1200) Pulse Rate:  [91-101] 96 (06/21 1000) Resp:  [16-33] 18 (06/21 0600) SpO2:  [95 %-98 %] 95 % (06/21 1000) Weight:  [218 lb 11.1 oz (99.2 kg)] 218 lb 11.1 oz (99.2 kg) (06/21 0400) Last BM Date: 12/25/12  Intake/Output from previous day: 06/20 0701 - 06/21 0700 In: 2072.6 [I.V.:1272.6; IV Piggyback:800] Out: 2311 [Urine:260; Drains:2050; Stool:1] Intake/Output this shift: Total I/O In: 200.7 [I.V.:200.7] Out: 260 [Urine:260]  General appearance: alert, cooperative, no distress and jaundiced GI: normal findings: soft, non-tender Incision/Wound: VAC intact and clean  Lab Results:   Recent Labs  12/25/12 0420 12/26/12 0400  WBC 13.0* 14.6*  HGB 9.0* 8.3*  HCT 25.9* 23.9*  PLT 137* 132*   BMET  Recent Labs  12/25/12 1900 12/26/12 0400  NA 126* 128*  K 3.8 3.6  CL 100 99  CO2 13* 15*  GLUCOSE 244* 221*  BUN 27* 28*  CREATININE 2.98* 2.98*  CALCIUM 7.4* 7.7*     Studies/Results: No results found.  Anti-infectives: Anti-infectives   Start     Dose/Rate Route Frequency Ordered Stop   12/24/12 1500  imipenem-cilastatin (PRIMAXIN) 250 mg in sodium chloride 0.9 % 100 mL IVPB     250 mg 200 mL/hr over 30 Minutes Intravenous 3 times per day 12/24/12 1436     01/13/13 1830  vancomycin (VANCOCIN) IVPB 1000 mg/200 mL premix     1,000 mg 200 mL/hr over 60 Minutes Intravenous Every 48 hours 2013-01-13 1820     Jan 13, 2013 1830  imipenem-cilastatin (PRIMAXIN) 250 mg in sodium chloride 0.9 % 100 mL IVPB  Status:  Discontinued     250 mg 200 mL/hr over 30 Minutes Intravenous Every 12 hours 01-13-2013 1820 12/24/12 1436      Assessment/Plan: Sepsis, apparent Staph. Cirrhosis/jaundice Recent bowel resection, no apparent GI  source More stable    LOS: 3 days    Nolton Denis T 12/26/2012

## 2012-12-27 LAB — PROTIME-INR: INR: 3.09 — ABNORMAL HIGH (ref 0.00–1.49)

## 2012-12-27 LAB — COMPREHENSIVE METABOLIC PANEL
ALT: 41 U/L — ABNORMAL HIGH (ref 0–35)
AST: 85 U/L — ABNORMAL HIGH (ref 0–37)
GFR calc non Af Amer: 16 mL/min — ABNORMAL LOW (ref >90)
Potassium: 2.8 mEq/L — ABNORMAL LOW (ref 3.5–5.1)
Total Bilirubin: 14.7 mg/dL — ABNORMAL HIGH (ref 0.3–1.2)

## 2012-12-27 LAB — GLUCOSE, CAPILLARY
Glucose-Capillary: 134 mg/dL — ABNORMAL HIGH (ref 70–99)
Glucose-Capillary: 145 mg/dL — ABNORMAL HIGH (ref 70–99)
Glucose-Capillary: 154 mg/dL — ABNORMAL HIGH (ref 70–99)
Glucose-Capillary: 161 mg/dL — ABNORMAL HIGH (ref 70–99)

## 2012-12-27 LAB — BLOOD GAS, ARTERIAL
Acid-base deficit: 4.9 mmol/L — ABNORMAL HIGH (ref 0.0–2.0)
FIO2: 21 %
O2 Saturation: 93.5 %
Patient temperature: 97.3
TCO2: 20 mmol/L (ref 0–100)

## 2012-12-27 LAB — CBC
MCH: 30.9 pg (ref 26.0–34.0)
MCHC: 32.8 g/dL (ref 30.0–36.0)
Platelets: 74 10*3/uL — ABNORMAL LOW (ref 150–400)
RDW: 23.2 % — ABNORMAL HIGH (ref 11.5–15.5)

## 2012-12-27 MED ORDER — POTASSIUM CHLORIDE 10 MEQ/50ML IV SOLN
10.0000 meq | INTRAVENOUS | Status: AC
  Administered 2012-12-27 (×6): 10 meq via INTRAVENOUS
  Filled 2012-12-27: qty 200
  Filled 2012-12-27: qty 100

## 2012-12-27 NOTE — Progress Notes (Signed)
  Subjective: No acute changes.  Tol FLD.  Objective: Vital signs in last 24 hours: Temp:  [97.3 F (36.3 C)-97.7 F (36.5 C)] 97.3 F (36.3 C) (06/22 0438) Pulse Rate:  [89-109] 106 (06/22 0100) Resp:  [13-21] 17 (06/22 0100) SpO2:  [95 %-98 %] 96 % (06/22 0100) Weight:  [219 lb 12.8 oz (99.7 kg)] 219 lb 12.8 oz (99.7 kg) (06/22 0500) Last BM Date: 12/25/12  Intake/Output from previous day: 06/21 0701 - 06/22 0700 In: 3817.3 [P.O.:700; I.V.:1467.3; IV Piggyback:1650] Out: 1965 [Urine:515; Drains:1450] Intake/Output this shift:    General appearance: alert and cooperative GI: soft, non-tender; bowel sounds normal; no masses,  no organomegaly  Lab Results:   Recent Labs  12/26/12 0400 12/27/12 0400  WBC 14.6* 8.6  HGB 8.3* 7.7*  HCT 23.9* 23.5*  PLT 132* 74*   BMET  Recent Labs  12/26/12 0400 12/27/12 0400  NA 128* 129*  K 3.6 2.8*  CL 99 101  CO2 15* 19  GLUCOSE 221* 169*  BUN 28* 29*  CREATININE 2.98* 2.94*  CALCIUM 7.7* 7.6*   PT/INR  Recent Labs  12/26/12 0400 12/27/12 0400  LABPROT 31.1* 30.2*  INR 3.21* 3.09*   ABG  Recent Labs  12/25/12 2140 12/27/12 0444  PHART 7.365 7.415  HCO3 13.4* 19.0*    Studies/Results: No results found.  Anti-infectives: Anti-infectives   Start     Dose/Rate Route Frequency Ordered Stop   12/26/12 2200  vancomycin (VANCOCIN) IVPB 1000 mg/200 mL premix     1,000 mg 200 mL/hr over 60 Minutes Intravenous Every 24 hours 12/26/12 2032     12/26/12 1400  imipenem-cilastatin (PRIMAXIN) 250 mg in sodium chloride 0.9 % 100 mL IVPB     250 mg 200 mL/hr over 30 Minutes Intravenous Every 6 hours 12/26/12 1344     12/24/12 1500  imipenem-cilastatin (PRIMAXIN) 250 mg in sodium chloride 0.9 % 100 mL IVPB  Status:  Discontinued     250 mg 200 mL/hr over 30 Minutes Intravenous 3 times per day 12/24/12 1436 12/26/12 1344   12-26-2012 1830  vancomycin (VANCOCIN) IVPB 1000 mg/200 mL premix  Status:  Discontinued     1,000 mg 200 mL/hr over 60 Minutes Intravenous Every 48 hours 12-26-12 1820 12/26/12 2032   12/26/12 1830  imipenem-cilastatin (PRIMAXIN) 250 mg in sodium chloride 0.9 % 100 mL IVPB  Status:  Discontinued     250 mg 200 mL/hr over 30 Minutes Intravenous Every 12 hours 2012-12-26 1820 12/24/12 1436      Assessment/Plan: Sepsis, apparent Staph. Cirrhosis/jaundice Recent bowel resection, no apparent GI source More stable Con't PO as tolerates.    LOS: 4 days    Marigene Ehlers., [facility] 12/27/2012

## 2012-12-27 NOTE — Progress Notes (Signed)
eLink Physician-Brief Progress Note Patient Name: [name] [name] DOB: Feb 13, 1946 MRN: [id number]  Date of Service  12/27/2012   HPI/Events of Note  Hypokalemia   eICU Interventions  Potassium replaced   Intervention Category Intermediate Interventions: Electrolyte abnormality - evaluation and management  [name],[name] 12/27/2012, 5:12 AM

## 2012-12-27 NOTE — Progress Notes (Signed)
PULMONARY  / CRITICAL CARE MEDICINE  Name: [name] [name] MRN: [id number] DOB: [birth year]    ADMISSION DATE:  01-10-13 CONSULTATION DATE:  01-10-2013  REFERRING MD :  EDP PRIMARY SERVICE: PCCM  CHIEF COMPLAINT:  Septic shock  BRIEF PATIENT DESCRIPTION: 67 y/o female with alcoholic cirrhosis was admitted on 6/18 through the [facility] ED for septic shock in the setting of a recent diagnosis of a "bloodstream infection".    SIGNIFICANT EVENTS: 6/19 Add solu cortef, placed wound vac 6/20 Albumin given 6/21 Albumin given, added midodrine  STUDIES: 6/19 CT abd/pelvis >> Rt > Lt pleural effusions, numerous calcified gallstones, small volume ascites, mesenteric edema, diffuse thickening of cecum and ascending colon 6/19 Doppler abd >> no portal vein thrombosis  LINES / TUBES: 6/17 R arm PICC >> 6/18 Lt IJ CVL >>   CULTURES: 6/16 blood >> G[facility] in clusters/pairs ([facility]) 6/18 urine >>negative  ANTIBIOTICS: 6/14 bactrim >> 6/18 [facility]) 6/17 Cipro >> 6/18 [facility] 6/18 vanc >> 6/18 imipenem >>  SUBJECTIVE/Overnight:  Decreased UOP overnight. 1L bolus given. Pressors off.   VITAL SIGNS: Temp:  [97.3 F (36.3 C)-97.7 F (36.5 C)] 97.3 F (36.3 C) (06/22 0438) Pulse Rate:  [89-109] 95 (06/22 1000) Resp:  [11-21] 16 (06/22 1000) SpO2:  [95 %-98 %] 98 % (06/22 1000) Weight:  [219 lb 12.8 oz (99.7 kg)] 219 lb 12.8 oz (99.7 kg) (06/22 0500) HEMODYNAMICS: CVP:  [3 mmHg-10 mmHg] 9 mmHg Room air  INTAKE / OUTPUT: Intake/Output     06/21 0701 - 06/22 0700 06/22 0701 - 06/23 0700   P.O. 700    I.V. (mL/kg) 1467.3 (14.7) 260 (2.6)   IV Piggyback 1650 250   Total Intake(mL/kg) 3817.3 (38.3) 510 (5.1)   Urine (mL/kg/hr) 515 (0.2) 165 (0.4)   Drains 1450 (0.6) 525 (1.2)   Stool     Total Output 1965 690   Net +1852.3 -180        Stool Occurrence 3 x      PHYSICAL EXAMINATION:  Gen: No distress HEENT: Jaundiced PULM: resps even non labored, no wheeze CV:  regular AB: wound vac in place with large amount output  Ext: anasarca Derm: R leg wound> very superficial and small skin laceration < 1cm long, no surrounding erythema or warmth Neuro: Alert, normal strength, follows commands   LABS:  Recent Labs Lab 2013/01/10 2040  12/24/12 0240 12/24/12 0424  12/25/12 0420 12/25/12 1900 12/25/12 2140 12/26/12 0400 12/27/12 0400 12/27/12 0444  HGB  --   < >  --   --   < > 9.0*  --   --  8.3* 7.7*  --   WBC  --   < >  --   --   < > 13.0*  --   --  14.6* 8.6  --   PLT  --   < >  --   --   < > 137*  --   --  132* 74*  --   NA  --   --   --   --   < > 125* 126*  --  128* 129*  --   K  --   --   --   --   < > 4.3 3.8  --  3.6 2.8*  --   CL  --   --   --   --   < > 99 100  --  99 101  --   CO2  --   --   --   --   < >  16* 13*  --  15* 19  --   GLUCOSE  --   --   --   --   < > 76 244*  --  221* 169*  --   BUN  --   --   --   --   < > 24* 27*  --  28* 29*  --   CREATININE  --   < >  --   --   < > 2.88* 2.98*  --  2.98* 2.94*  --   CALCIUM  --   --   --   --   < > 7.3* 7.4*  --  7.7* 7.6*  --   MG  --   --   --   --   --  1.9  --   --   --   --   --   PHOS  --   --   --   --   --  5.7*  --   --   --   --   --   AST  --   --   --   --   --  85*  --   --  87* 85*  --   ALT  --   --   --   --   --  31  --   --  38* 41*  --   ALKPHOS  --   --   --   --   --  124*  --   --  130* 111  --   BILITOT  --   --   --   --   --  16.4*  --   --  17.6* 14.7*  --   PROT  --   --   --   --   --  5.8*  --   --  5.7* 5.0*  --   ALBUMIN  --   --   --   --   --  1.1*  --   --  1.4* 1.5*  --   APTT 73*  --   --   --   --   --   --   --   --   --   --   INR 5.02*  --   --   --   < > 3.16*  --   --  3.21* 3.09*  --   LATICACIDVEN 3.2*  --  2.4*  --   --   --   --   --   --   --   --   TROPONINI <0.30  --   --   --   --   --   --   --   --   --   --   PROCALCITON 0.58  --   --   --   --   --   --   --   --   --   --   PHART  --   --   --  7.405  --   --   --  7.365  --    --  7.415  PCO2ART  --   --   --  26.6*  --   --   --  23.5*  --   --  30.0*  PO2ART  --   --   --  71.6*  --   --   --  74.0*  --   --  67.6*  < > = values in this interval not displayed.  Recent Labs Lab 12/26/12 1134 12/26/12 1610 12/26/12 2334 12/27/12 0423 12/27/12 0811  GLUCAP 156* 160* 152* 134* 145*    Imaging: No results found.   ASSESSMENT / PLAN:  PULMONARY A: Small pleural effusions >> in setting of cirrhosis, ascites, and low albumin state. P:   -oxygen as needed to keep SpO2 > 92% -f/u CXR as needed -no indication for thoracentesis at present  CARDIOVASCULAR A: Septic shock with report of bacteremia from nursing home >> off pressors 6/22. Hx of HTN. P:  -even fluid balance -May cont require int fluid bolus/albumin for intravascular depletion -cont hold benazepril, labetalol  RENAL A: Acute renal failure likely from septic shock and ACE inhibitor use and ?hepatorenal >> baseline creatinine 0.79 from 11/26/12.  Hyponatremia in setting of cirrhosis. Non gap acidosis. P:   -continue hemodynamic support -monitor renal fx, urine outpt, electrolytes -add HCO3 to IV fluid 6/20 >> continue and f/u ABG, BMET -cont midodrine   GASTROINTESTINAL A:  ETOH cirrhosis with hypoalbuminemia. 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 Protein calorie malnutrition. P:   -wound care per CCS and wound care team -advance diet per CCS -protonix for SUP  HEMATOLOGIC A: Anemia, thrombocytopenia of critical illness and chronic disease. 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 P:  -f/u CBC, INR  -SCD for DVT prevention -plt cont trend down, likely r/t sepsis but if cont d/c heparin   INFECTIOUS A: Septic shock >> possible sources are UTI, bacteremia, colitis, and SBP.  Not enough peritoneal fluid to do paracentesis >> significant leakage from  wound site. P:   -D5/x vancomycin, imipenem -continue to call [facility] [phone]) for 6/16 blood culture results  ENDOCRINE A:  DM type II. Relative adrenal insufficiency >> cortisol 12 from 6/18. P:   -SSI -started solu cortef 6/19 - consider taper with improved BP, off pressors   NEUROLOGIC A:  Mild hepatic encephalopathy - improving  P:   -continue lactulose -minimize sedating meds   Tx to SDU.   Danford Bad, NP 12/27/2012  11:28 AM Pager: [phone] or [phone]  *Care during the described time interval was provided by me and/or other providers on the critical care team. I have reviewed this patient's available data, including medical history, events of note, physical examination and test results as part of my evaluation.    Reviewed above, examined pt, and agree with assessment/plan.  Clinically improving.  Off pressors.  Continue solu cortef, midodrine, and Abx.  Okay for transfer to SDU.  Keep on PCCM service.  Updated family at bedside.  Coralyn Helling, MD [facility] Pulmonary/Critical Care 12/27/2012, 12:27 PM Pager:  [phone] After 3pm call: [phone]

## 2012-12-28 ENCOUNTER — Encounter (HOSPITAL_COMMUNITY): Payer: Self-pay

## 2012-12-28 LAB — BASIC METABOLIC PANEL
GFR calc Af Amer: 19 mL/min — ABNORMAL LOW (ref >90)
GFR calc non Af Amer: 16 mL/min — ABNORMAL LOW (ref >90)
Glucose, Bld: 171 mg/dL — ABNORMAL HIGH (ref 70–99)
Potassium: 3.2 mEq/L — ABNORMAL LOW (ref 3.5–5.1)
Sodium: 133 mEq/L — ABNORMAL LOW (ref 135–145)

## 2012-12-28 LAB — GLUCOSE, CAPILLARY: Glucose-Capillary: 140 mg/dL — ABNORMAL HIGH (ref 70–99)

## 2012-12-28 LAB — CBC
Hemoglobin: 8.5 g/dL — ABNORMAL LOW (ref 12.0–15.0)
MCHC: 33.2 g/dL (ref 30.0–36.0)
RBC: 2.72 MIL/uL — ABNORMAL LOW (ref 3.87–5.11)

## 2012-12-28 MED ORDER — SODIUM BICARBONATE 650 MG PO TABS
650.0000 mg | ORAL_TABLET | Freq: Two times a day (BID) | ORAL | Status: DC
  Administered 2012-12-28 – 2012-12-29 (×2): 650 mg via ORAL
  Filled 2012-12-28 (×3): qty 1

## 2012-12-28 MED ORDER — POTASSIUM CHLORIDE CRYS ER 20 MEQ PO TBCR: 20.0000 meq | EXTENDED_RELEASE_TABLET | Freq: Once | ORAL | Status: DC

## 2012-12-28 MED ORDER — POTASSIUM CHLORIDE 10 MEQ/50ML IV SOLN
10.0000 meq | INTRAVENOUS | Status: AC
  Administered 2012-12-28 (×4): 10 meq via INTRAVENOUS
  Filled 2012-12-28: qty 150

## 2012-12-28 MED ORDER — DEXTROSE 5 % IV SOLN
1.0000 g | INTRAVENOUS | Status: DC
  Administered 2012-12-28 – 2013-01-04 (×8): 1 g via INTRAVENOUS
  Filled 2012-12-28 (×9): qty 10

## 2012-12-28 MED ORDER — PANTOPRAZOLE SODIUM 40 MG PO TBEC
40.0000 mg | DELAYED_RELEASE_TABLET | Freq: Every day | ORAL | Status: DC
  Administered 2012-12-29 – 2013-01-03 (×6): 40 mg via ORAL
  Filled 2012-12-28 (×7): qty 1

## 2012-12-28 NOTE — Progress Notes (Signed)
UR Completed.  [name] [name] Jane [phone] 12/28/2012  

## 2012-12-28 NOTE — Progress Notes (Signed)
PULMONARY  / CRITICAL CARE MEDICINE  Name: [name] [name] MRN: [id number] DOB: April 11, 1946    ADMISSION DATE:  01/09/13 CONSULTATION DATE:  09-Jan-2013  REFERRING MD :  EDP  CHIEF COMPLAINT:  Septic shock  BRIEF PATIENT DESCRIPTION: 67 y/o female with alcoholic cirrhosis was admitted on 6/18 through the [facility] ED for septic shock in the setting of a recent diagnosis of a "bloodstream infection".    SIGNIFICANT EVENTS: 6/19 Add solu cortef, placed wound vac 6/20 Albumin given 6/21 Albumin given, added midodrine 6/23 Transfer to SDU  STUDIES: 6/19 CT abd/pelvis >> Rt > Lt pleural effusions, numerous calcified gallstones, small volume ascites, mesenteric edema, diffuse thickening of cecum and ascending colon 6/19 Doppler abd >> no portal vein thrombosis  LINES / TUBES: 6/17 R arm PICC >> 6/18 Lt IJ CVL >> 6/23  CULTURES: 6/16 blood >> GPC [facility] 6/18 urine >>negative  ANTIBIOTICS: 6/14 bactrim >> 6/18 ([facility]) 6/17 Cipro >> 6/18 [facility]) 6/18 imipenem >> 6/23 6/18 vanc >> 6/23 Ceftrx >>   SUBJECTIVE/Overnight:  Remains off pressors. No new complaints. Pleasant but poorly oriented  VITAL SIGNS: Temp:  [97.3 F (36.3 C)-98.1 F (36.7 C)] 97.4 F (36.3 C) (06/23 1513) Pulse Rate:  [88-102] 93 (06/23 1245) Resp:  [11-20] 18 (06/23 1245) BP: (94-132)/(47-76) 107/62 mmHg (06/23 1245) SpO2:  [92 %-98 %] 96 % (06/23 1245) HEMODYNAMICS: CVP:  [7 mmHg] 7 mmHg  INTAKE / OUTPUT: Intake/Output     06/22 0701 - 06/23 0700 06/23 0701 - 06/24 0700   P.O.     I.V. (mL/kg) 1660 (16.7) 210 (2.1)   IV Piggyback 800    Total Intake(mL/kg) 2460 (24.7) 210 (2.1)   Urine (mL/kg/hr) 797 (0.3) 220 (0.2)   Drains 1515 (0.6) 500 (0.5)   Total Output 2312 720   Net +148 -510          PHYSICAL EXAMINATION:  Gen: No distress, severely icteric HEENT: sclericterus and scleredema PULM: clear CV: RRR AB: wound vac in place, abd soft Ext:  symmetric edema Derm: R leg wound> very superficial and small skin laceration < 1cm long, no surrounding erythema or warmth Neuro: Alert, normal strength, follows commands, poorly oriented   LABS:  Recent Labs Lab Jan 09, 2013 2040  12/24/12 0240 12/24/12 0424  12/25/12 0420  12/25/12 2140 12/26/12 0400 12/27/12 0400 12/27/12 0444 12/28/12 0500  HGB  --   < >  --   --   < > 9.0*  --   --  8.3* 7.7*  --  8.5*  WBC  --   < >  --   --   < > 13.0*  --   --  14.6* 8.6  --  12.3*  PLT  --   < >  --   --   < > 137*  --   --  132* 74*  --  93*  NA  --   --   --   --   < > 125*  < >  --  128* 129*  --  133*  K  --   --   --   --   < > 4.3  < >  --  3.6 2.8*  --  3.2*  CL  --   --   --   --   < > 99  < >  --  99 101  --  103  CO2  --   --   --   --   < >  16*  < >  --  15* 19  --  22  GLUCOSE  --   --   --   --   < > 76  < >  --  221* 169*  --  171*  BUN  --   --   --   --   < > 24*  < >  --  28* 29*  --  31*  CREATININE  --   < >  --   --   < > 2.88*  < >  --  2.98* 2.94*  --  2.90*  CALCIUM  --   --   --   --   < > 7.3*  < >  --  7.7* 7.6*  --  7.7*  MG  --   --   --   --   --  1.9  --   --   --   --   --   --   PHOS  --   --   --   --   --  5.7*  --   --   --   --   --   --   AST  --   --   --   --   --  85*  --   --  87* 85*  --   --   ALT  --   --   --   --   --  31  --   --  38* 41*  --   --   ALKPHOS  --   --   --   --   --  124*  --   --  130* 111  --   --   BILITOT  --   --   --   --   --  16.4*  --   --  17.6* 14.7*  --   --   PROT  --   --   --   --   --  5.8*  --   --  5.7* 5.0*  --   --   ALBUMIN  --   --   --   --   --  1.1*  --   --  1.4* 1.5*  --   --   APTT 73*  --   --   --   --   --   --   --   --   --   --   --   INR 5.02*  --   --   --   < > 3.16*  --   --  3.21* 3.09*  --   --   LATICACIDVEN 3.2*  --  2.4*  --   --   --   --   --   --   --   --   --   TROPONINI <0.30  --   --   --   --   --   --   --   --   --   --   --   PROCALCITON 0.58  --   --   --   --   --   --    --   --   --   --   --   PHART  --   --   --  7.405  --   --   --  7.365  --   --  7.415  --  PCO2ART  --   --   --  26.6*  --   --   --  23.5*  --   --  30.0*  --   PO2ART  --   --   --  71.6*  --   --   --  74.0*  --   --  67.6*  --   < > = values in this interval not displayed.  Recent Labs Lab 12/27/12 1314 12/27/12 1712 12/27/12 2009 12/28/12 0754 12/28/12 1158  GLUCAP 147* 154* 161* 131* 140*   CXR: NNF   ASSESSMENT / PLAN:  PULMONARY A: Small pleural effusions >> in setting of cirrhosis, ascites, and low albumin state. P:   -oxygen as needed to keep SpO2 > 92% -f/u CXR as needed -no indication for thoracentesis at present  CARDIOVASCULAR A: Septic shock with report of bacteremia from nursing home, resolved 6/22 Hx of HTN. P:  -even fluid balance -Cont midodrine  RENAL A: Acute renal failure likely from septic shock and ACE inhibitor use and ?hepatorenal >> baseline creatinine 0.79 from 11/26/12.  Hyponatremia, mild - likely due to cirrhosis. Hypokalemia, mild Non gap acidosis. Very poor candidate for HD P:   -cont midodrine  -Change bicarb to PO -Monitor renal panel  GASTROINTESTINAL A:  ETOH cirrhosis with hypoalbuminemia. 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 Protein calorie malnutrition. P:   -wound care per CCS and wound care team -advance diet per CCS -Cont PPI for SUP due to coagulopathy  HEMATOLOGIC A: Anemia of critical illness and chronic disease. 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 P:  -Monitor CBC -Recheck PT, PTT AM 6/24    INFECTIOUS A: Septic shock, resolved Bacteremia P:   -Abx and micro as above -Need to track down 6/16 [facility] results from [facility] [phone])  ENDOCRINE A:  H/O DM type II. Mild hyperglycemia High risk of hypoglycemia due to liver failure Relative adrenal insufficiency >>  cortisol 12 from 6/18. P:   -Monitor off SSI (CBGs q 8 hrs) -D/C hydrocortisone 6/23   NEUROLOGIC A:  Mild hepatic encephalopathy - improving  P:   -continue lactulose -minimize sedating meds   Tx to SDU 6/23.   Billy Fischer, MD ; [facility] [phone].  After 5:30 PM or weekends, call [phone]

## 2012-12-28 NOTE — Progress Notes (Signed)
CCS/Kaylina Cahue Progress Note    Subjective: Patient without many complaints  Objective: Vital signs in last 24 hours: Temp:  [95.3 F (35.2 C)-98.1 F (36.7 C)] 97.3 F (36.3 C) (06/23 0800) Pulse Rate:  [84-102] 92 (06/22 2300) Resp:  [11-18] 12 (06/22 2300) BP: (94-132)/(47-76) 113/51 mmHg (06/22 2300) SpO2:  [92 %-99 %] 97 % (06/22 2300) Last BM Date: 12/27/12  Intake/Output from previous day: 06/22 0701 - 06/23 0700 In: 2460 [I.V.:1660; IV Piggyback:800] Out: 2312 [Urine:797; Drains:1515] Intake/Output this shift:    General: No distress.  Very jaundiced.  Lungs: Clear  Abd: Benign.  VAC in place.  Working well  Extremities: No change  Neuro: Intact  Lab Results:  @LABLAST2 (wbc:2,hgb:2,hct:2,plt:2) BMET  Recent Labs  12/27/12 0400 12/28/12 0500  NA 129* 133*  K 2.8* 3.2*  CL 101 103  CO2 19 22  GLUCOSE 169* 171*  BUN 29* 31*  CREATININE 2.94* 2.90*  CALCIUM 7.6* 7.7*   PT/INR  Recent Labs  12/26/12 0400 12/27/12 0400  LABPROT 31.1* 30.2*  INR 3.21* 3.09*   ABG  Recent Labs  12/25/12 2140 12/27/12 0444  PHART 7.365 7.415  HCO3 13.4* 19.0*    Studies/Results: No results found.  Anti-infectives: Anti-infectives   Start     Dose/Rate Route Frequency Ordered Stop   12/26/12 2200  vancomycin (VANCOCIN) IVPB 1000 mg/200 mL premix     1,000 mg 200 mL/hr over 60 Minutes Intravenous Every 24 hours 12/26/12 2032     12/26/12 1400  imipenem-cilastatin (PRIMAXIN) 250 mg in sodium chloride 0.9 % 100 mL IVPB     250 mg 200 mL/hr over 30 Minutes Intravenous Every 6 hours 12/26/12 1344     12/24/12 1500  imipenem-cilastatin (PRIMAXIN) 250 mg in sodium chloride 0.9 % 100 mL IVPB  Status:  Discontinued     250 mg 200 mL/hr over 30 Minutes Intravenous 3 times per day 12/24/12 1436 12/26/12 1344   12/17/2012 1830  vancomycin (VANCOCIN) IVPB 1000 mg/200 mL premix  Status:  Discontinued     1,000 mg 200 mL/hr over 60 Minutes Intravenous Every 48 hours  12/22/2012 1820 12/26/12 2032   12/27/2012 1830  imipenem-cilastatin (PRIMAXIN) 250 mg in sodium chloride 0.9 % 100 mL IVPB  Status:  Discontinued     250 mg 200 mL/hr over 30 Minutes Intravenous Every 12 hours 12/15/2012 1820 12/24/12 1436      Assessment/Plan: s/p  Advance diet No surgical problems. We will sign off for now.  LOS: 5 days   Marta Lamas. Gae Bon, MD, FACS [phone] [facility] Surgery 12/28/2012

## 2012-12-28 NOTE — Progress Notes (Signed)
[facility] ADULT ICU REPLACEMENT PROTOCOL FOR AM LAB REPLACEMENT ONLY  The patient does not apply for the [facility] Adult ICU Electrolyte Replacment Protocol based on the criteria listed below:   1. Is GFR >/= 40 ml/min? no  Patient's GFR today is 16     Abnormal electrolyte(s): k3.2  6. If a panic level lab has been reported, has the CCM MD in charge been notified? yes.   Physician:  Holland Commons MD  Melrose Nakayama 12/28/2012 6:49 AM

## 2012-12-29 ENCOUNTER — Inpatient Hospital Stay (HOSPITAL_COMMUNITY): Payer: BC Managed Care – PPO

## 2012-12-29 LAB — COMPREHENSIVE METABOLIC PANEL
ALT: 53 U/L — ABNORMAL HIGH (ref 0–35)
AST: 102 U/L — ABNORMAL HIGH (ref 0–37)
Albumin: 1.4 g/dL — ABNORMAL LOW (ref 3.5–5.2)
Alkaline Phosphatase: 124 U/L — ABNORMAL HIGH (ref 39–117)
BUN: 36 mg/dL — ABNORMAL HIGH (ref 6–23)
CO2: 24 mEq/L (ref 19–32)
Calcium: 8 mg/dL — ABNORMAL LOW (ref 8.4–10.5)
Chloride: 102 mEq/L (ref 96–112)
Creatinine, Ser: 3.04 mg/dL — ABNORMAL HIGH (ref 0.50–1.10)
GFR calc Af Amer: 18 mL/min — ABNORMAL LOW (ref >90)
GFR calc non Af Amer: 15 mL/min — ABNORMAL LOW (ref >90)
Glucose, Bld: 108 mg/dL — ABNORMAL HIGH (ref 70–99)
Potassium: 3.9 mEq/L (ref 3.5–5.1)
Sodium: 135 mEq/L (ref 135–145)
Total Bilirubin: 13.3 mg/dL — ABNORMAL HIGH (ref 0.3–1.2)
Total Protein: 5 g/dL — ABNORMAL LOW (ref 6.0–8.3)

## 2012-12-29 LAB — CBC
HCT: 26.1 % — ABNORMAL LOW (ref 36.0–46.0)
Hemoglobin: 8.9 g/dL — ABNORMAL LOW (ref 12.0–15.0)
MCH: 31.6 pg (ref 26.0–34.0)
MCHC: 34.1 g/dL (ref 30.0–36.0)
MCV: 92.6 fL (ref 78.0–100.0)
Platelets: 88 10*3/uL — ABNORMAL LOW (ref 150–400)
RBC: 2.82 MIL/uL — ABNORMAL LOW (ref 3.87–5.11)
RDW: 23.1 % — ABNORMAL HIGH (ref 11.5–15.5)
WBC: 14.2 10*3/uL — ABNORMAL HIGH (ref 4.0–10.5)

## 2012-12-29 LAB — GLUCOSE, CAPILLARY: Glucose-Capillary: 121 mg/dL — ABNORMAL HIGH (ref 70–99)

## 2012-12-29 LAB — APTT: aPTT: 51 seconds — ABNORMAL HIGH (ref 24–37)

## 2012-12-29 MED ORDER — SODIUM CHLORIDE 0.9 % IJ SOLN
10.0000 mL | Freq: Two times a day (BID) | INTRAMUSCULAR | Status: DC
  Administered 2012-12-29 – 2012-12-30 (×2): 10 mL via INTRAVENOUS

## 2012-12-29 MED ORDER — SODIUM CHLORIDE 0.9 % IJ SOLN
INTRAMUSCULAR | Status: AC
  Administered 2012-12-29: 10 mL
  Filled 2012-12-29: qty 10

## 2012-12-29 MED ORDER — SODIUM CHLORIDE 0.9 % IJ SOLN
10.0000 mL | INTRAMUSCULAR | Status: DC | PRN
  Administered 2012-12-31 – 2013-01-01 (×3): 10 mL via INTRAVENOUS

## 2012-12-29 MED ORDER — SODIUM BICARBONATE 650 MG PO TABS
650.0000 mg | ORAL_TABLET | Freq: Two times a day (BID) | ORAL | Status: AC
  Administered 2012-12-29: 650 mg via ORAL
  Filled 2012-12-29 (×2): qty 1

## 2012-12-29 NOTE — Progress Notes (Signed)
Notified Dr. Craige Cotta of pt's low urine output and high output from wound vac.  No new orders received.  Will continue to monitor.   Roselie Awkward, RN

## 2012-12-29 NOTE — Progress Notes (Addendum)
Pharmacist confirmed with SNF that the patient has Coagulase negative bacteremia just prior to her admit.  Unclear why she did not have repeat blood cultures on admit here.  - will check repeat blood cultures today - she is currently on vancomcyin , - would consider getting echo if she has recurrent bacteremia due to concern for endocarditis

## 2012-12-29 NOTE — Significant Event (Signed)
Received phone call from pharmacist.  Received report from [facility] about blood culture >> one blood culture with coag negative staph.  Will d/c vancomycin.  Coralyn Helling, MD [facility] Pulmonary/Critical Care 12/29/2012, 3:37 PM Pager:  [phone] After 3pm call: [phone]

## 2012-12-29 NOTE — Progress Notes (Signed)
Clinical Social Work Department BRIEF PSYCHOSOCIAL ASSESSMENT 12/29/2012  Patient:  [name] [name], [name] [name]     Account Number:  [id number]     Admit date:  12/08/2012  Clinical Social Worker:  Leron Croak, CLINICAL SOCIAL WORKER  Date/Time:  12/29/2012 02:46 PM  Referred by:  Physician  Date Referred:  12/28/2012 Referred for  SNF Placement   Other Referral:   Interview type:  Patient Other interview type:   Patient also had a friend at the bedside that was able to provide information about prior placement    PSYCHOSOCIAL DATA Living Status:  FACILITY Admitted from facility:  [facility] [place] Level of care:  Skilled Nursing Facility Primary support name:  [name] [name]  [phone] Primary support relationship to patient:  SPOUSE Degree of support available:   Pt has great support from her friends and family. Pt also has support for her recovery from her prior SNF placement    CURRENT CONCERNS Current Concerns  Post-Acute Placement   Other Concerns:    SOCIAL WORK ASSESSMENT / PLAN CSW met with the Pt and her friend at the bedside. CSW introduced self to disguss Pt's prior placement and to evaluate whether Pt would be returning to prior facility or another faciloity. Pt stated that she did "not want to return to previous location because it was [place]". Pt would like CSW to search for other facilities in the [facility].   Assessment/plan status:  Information/Referral to [facility] Other assessment/ plan:   Information/referral to community resources:   CSW will provide family with a listing of facilities in the [place] area    PATIENT'S/FAMILY'S RESPONSE TO PLAN OF CARE: Pt and visitor were appreciative of visit and SNF search.       Leron Croak, LCSWA [facility] Emergency Dept.  [phone]

## 2012-12-29 NOTE — Progress Notes (Signed)
PULMONARY  / CRITICAL CARE MEDICINE  Name: [name] [name] MRN: [id number] DOB: 1946-03-07    ADMISSION DATE:  12/24/2012 CONSULTATION DATE:  12/21/2012  REFERRING MD :  EDP  CHIEF COMPLAINT:  Septic shock  BRIEF PATIENT DESCRIPTION: 67 y/o female with alcoholic cirrhosis was admitted on 6/18 through the [facility] ED for septic shock in the setting of a recent diagnosis of a "bloodstream infection".    SIGNIFICANT EVENTS: 6/19 Add solu cortef, placed wound vac 6/20 Albumin given 6/21 Albumin given, added midodrine 6/23 Transfer to SDU  STUDIES: 6/19 CT abd/pelvis >> Rt > Lt pleural effusions, numerous calcified gallstones, small volume ascites, mesenteric edema, diffuse thickening of cecum and ascending colon 6/19 Doppler abd >> no portal vein thrombosis  LINES / TUBES: 6/17 R arm PICC >> 6/18 Lt IJ CVL >> 6/23  CULTURES: 6/16 blood >> GPC in clusters/pairs ([facility]) 6/18 urine >>negative  ANTIBIOTICS: 6/14 bactrim >> 6/18 ([facility]) 6/17 Cipro >> 6/18 [facility]) 6/18 imipenem >> 6/23 6/18 vanc >> 6/23 Ceftrx >>   SUBJECTIVE/Overnight:  Feels better.  Denies chest pain, shortness of breath.  VITAL SIGNS: Temp:  [97.4 F (36.3 C)-98.4 F (36.9 C)] 97.6 F (36.4 C) (06/24 0800) Pulse Rate:  [84-95] 88 (06/24 0800) Resp:  [12-18] 16 (06/24 0800) BP: (106-123)/(52-62) 120/53 mmHg (06/24 0800) SpO2:  [94 %-96 %] 96 % (06/24 0800) Weight:  [218 lb 11.1 oz (99.2 kg)] 218 lb 11.1 oz (99.2 kg) (06/24 0500)  INTAKE / OUTPUT: Intake/Output     06/23 0701 - 06/24 0700 06/24 0701 - 06/25 0700   I.V. (mL/kg) 570 (5.7)    IV Piggyback 250    Total Intake(mL/kg) 820 (8.3)    Urine (mL/kg/hr) 620 (0.3) 250 (0.8)   Drains 900 (0.4) 250 (0.8)   Total Output 1520 500   Net -700 -500        Stool Occurrence 1 x      PHYSICAL EXAMINATION:  Gen: No distress HEENT: scleral icterus PULM: clear CV: RRR AB: wound vac in place, abd soft Ext: symmetric  edema Derm: R leg wound> very superficial and small skin laceration < 1cm long, no surrounding erythema or warmth Neuro: Alert, normal strength, follows commands, poorly oriented   LABS: CBC Recent Labs     12/27/12  0400  12/28/12  0500  12/29/12  0500  WBC  8.6  12.3*  14.2*  HGB  7.7*  8.5*  8.9*  HCT  23.5*  25.6*  26.1*  PLT  74*  93*  88*    Coag's Recent Labs     12/27/12  0400  12/29/12  0500  APTT   --   51*  INR  3.09*  3.16*    BMET Recent Labs     12/27/12  0400  12/28/12  0500  12/29/12  0500  NA  129*  133*  135  K  2.8*  3.2*  3.9  CL  101  103  102  CO2  19  22  24   BUN  29*  31*  36*  CREATININE  2.94*  2.90*  3.04*  GLUCOSE  169*  171*  108*    Electrolytes Recent Labs     12/27/12  0400  12/28/12  0500  12/29/12  0500  CALCIUM  7.6*  7.7*  8.0*    Sepsis Markers No results found for this basename: LACTICACIDVEN, PROCALCITON, O2SATVEN,  in the last 72 hours  ABG Recent Labs  12/27/12  0444  PHART  7.415  PCO2ART  30.0*  PO2ART  67.6*    Liver Enzymes Recent Labs     12/27/12  0400  12/29/12  0500  AST  85*  102*  ALT  41*  53*  ALKPHOS  111  124*  BILITOT  14.7*  13.3*  ALBUMIN  1.5*  1.4*    Cardiac Enzymes No results found for this basename: TROPONINI, PROBNP,  in the last 72 hours  Glucose Recent Labs     12/27/12  2009  12/28/12  0754  12/28/12  1158  12/28/12  1516  12/29/12  0018  12/29/12  0752  GLUCAP  161*  131*  140*  110*  121*  94    Imaging Dg Chest Port 1 View  12/29/2012   *RADIOLOGY REPORT*  Clinical Data: Follow up respiratory failure  PORTABLE CHEST - 1 VIEW  Comparison: Prior chest x-ray 12/31/2012  Findings: The left IJ central venous catheter has been removed. The PICC was drawn back the tip is now in the distal subclavian vein.  The patient is markedly rotated toward the left.  Given limitations of patient positioning, slightly enlarged bilateral layering effusions and bibasilar  opacities.  Cardiomegaly remains unchanged.  Aortic atherosclerotic calcifications again noted. Mild vascular congestion without overt edema.  IMPRESSION:  1.  The right upper extremity PICC has been pulled back.  The tip now projects over the distal subclavian vein.  As this is still within a relatively central vein, it remains in usable position as long as the line flushes and aspirates easily.  2. Slightly enlarged bilateral layering pleural effusions with associated bibasilar atelectasis.  3.  Mild vascular congestion without edema.   Original Report Authenticated By: Malachy Moan, M.D.    ASSESSMENT / PLAN:  PULMONARY A: Small pleural effusions >> in setting of cirrhosis, ascites, and low albumin state. P:   -oxygen as needed to keep SpO2 > 92% -f/u CXR as needed -no indication for thoracentesis at present  CARDIOVASCULAR A: Septic shock with report of bacteremia from nursing home, resolved 6/22 Hx of HTN. P:  -even fluid balance -Continue midodrine  RENAL A: Acute renal failure likely from septic shock and ACE inhibitor use and ?hepatorenal >> baseline creatinine 0.79 from 11/26/12.  Hyponatremia, mild - likely due to cirrhosis >> improved Hypokalemia, mild Non gap acidosis >> resolved P:   -continue midodrine  -Change bicarb to PO >> d/c after dose on 6/24 -Monitor renal panel -defer nephrology evaluation for now  GASTROINTESTINAL A:  ETOH cirrhosis with hypoalbuminemia. 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 Protein calorie malnutrition. P:   -wound care per CCS and wound care team -advance diet per CCS -Cont PPI for SUP due to coagulopathy  HEMATOLOGIC A: Anemia of critical illness and chronic disease. 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 P:  -Monitor CBC -f/u INR   INFECTIOUS A: Septic shock, resolved Bacteremia P:   -Abx  and micro as above -Need to track down 6/16 [facility] results from [facility] [phone])  ENDOCRINE A:  H/O DM type II. Mild hyperglycemia High risk of hypoglycemia due to liver failure Relative adrenal insufficiency >> cortisol 12 from 6/18 >> off solucortef 6/23. P:   -Monitor off SSI (CBGs q 8 hrs)  NEUROLOGIC A:  Mild hepatic encephalopathy - improving  P:   -continue lactulose -minimize sedating meds  Will  ask [facility] to assume care from 6/25 and PCCM sign off.  Coralyn Helling, MD [facility] Pulmonary/Critical Care 12/29/2012, 10:21 AM Pager:  [phone] After 3pm call: [phone]

## 2012-12-30 ENCOUNTER — Inpatient Hospital Stay (HOSPITAL_COMMUNITY): Payer: BC Managed Care – PPO

## 2012-12-30 LAB — RENAL FUNCTION PANEL
Albumin: 1.4 g/dL — ABNORMAL LOW (ref 3.5–5.2)
BUN: 42 mg/dL — ABNORMAL HIGH (ref 6–23)
Calcium: 7.9 mg/dL — ABNORMAL LOW (ref 8.4–10.5)
Chloride: 102 mEq/L (ref 96–112)
Creatinine, Ser: 3.26 mg/dL — ABNORMAL HIGH (ref 0.50–1.10)
GFR calc non Af Amer: 14 mL/min — ABNORMAL LOW (ref >90)
Phosphorus: 4.3 mg/dL (ref 2.3–4.6)

## 2012-12-30 LAB — CBC
MCHC: 33.5 g/dL (ref 30.0–36.0)
MCV: 93.1 fL (ref 78.0–100.0)
Platelets: 101 10*3/uL — ABNORMAL LOW (ref 150–400)
RDW: 23.8 % — ABNORMAL HIGH (ref 11.5–15.5)
WBC: 18.3 10*3/uL — ABNORMAL HIGH (ref 4.0–10.5)

## 2012-12-30 LAB — GLUCOSE, CAPILLARY
Glucose-Capillary: 77 mg/dL (ref 70–99)
Glucose-Capillary: 127 mg/dL — ABNORMAL HIGH (ref 70–99)

## 2012-12-30 MED ORDER — SODIUM CHLORIDE 0.9 % IV BOLUS (SEPSIS)
250.0000 mL | Freq: Once | INTRAVENOUS | Status: AC
  Administered 2012-12-30: 250 mL via INTRAVENOUS

## 2012-12-30 NOTE — Progress Notes (Signed)
CSW provided list of bed offers for SNF facilities.   Husband was concerned that Pt wanted to change facilities and wanted Pt to go back to her current location.   Pt is alert and oriented and still has legal right to make medical decisions on her own behalf, this was explained to the husband. Pt and husband were encouraged to discuss placement concerns and come up to an agreement for placement.   CSW to follow for placement.    Leron Croak, LCSWA [facility] Emergency Dept.  [phone]

## 2012-12-30 NOTE — Progress Notes (Signed)
CSW spoke with Pt at the bedside.   Pt stated that she and her husband discussed placement options and Pt stated she will go back to her prior SNF placement.   CSW notified nursing facility and will work with Pt for d/c planning.   Leron Croak, LCSWA [facility] Emergency Dept.  [phone]

## 2012-12-30 NOTE — Progress Notes (Signed)
[facility] Progress Note [place] TEAM 1 - Stepdown/ICU TEAM   [name] [name] ZOX:[id number] DOB: 11-Nov-1945 DOA: 01/13/2013 PCP: Judie Petit, MD  Brief narrative: 67 y/o female with alcoholic cirrhosis admitted 6/18 through the [facility] ED for septic shock in the setting of a recent diagnosis of a "bloodstream infection".  She was admitted to [facility] with septic shock and respiratory failure from a small bowel perforation from 5/7 - 5/29. 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 She was discharged to [facility] for wound care, physical therapy, and speech therapy.  For the two weeks prior to this admission she experienced some difficulty sleeping, mild confusion, and worsening jaundice. The family was unaware of whether or not she had a fever, but apparently blood cultures were collected on 6/16 and were positive for GPC's in pairs and clusters. She was also believed to have a UTI. She was started on Septra for a UTI on 6/14 and this was stopped on 6/17. A PICC was placed on 6/17 and cipro IV was started. She went to her surgeons office for a wound check on 6/18 and had a normal blood pressure. After she returned to [facility] she was told to go to the ED because she had "low calcium that needed to be treated in the hospital". In the [facility] ED she was found to be notably hypotensive.   SIGNIFICANT EVENTS:  6/19 Add solu cortef, placed wound vac  6/20 Albumin given  6/21 Albumin given, added midodrine  6/23 Transfer to SDU  Assessment/Plan:  Septic shock resolved 6/22 per PCCM - no clear source identified per PCCM notes (UTI vs/ bacteremia vs/ colits vs/ SBP) - positive blood culture from the SNF (coag neg staph) - urine cx negative x2 but UA was grossly abnormal at admit - no qualifying signs/symptoms for sepsis at this time - continue to monitor clinically   Coagulase negative bacteremia   Blood cx repeated per ID 6/24 - per ID "consider getting echo if she has recurrent bacteremia due to concern for endocarditis" - vanc was d/c 6/24 per PCCM  Small pleural effusions >> in setting of cirrhosis, ascites, and low albumin state resp status is stable  Hx of HTN Pt is relatively hypotensive at this time   Acute renal failure likely from septic shock and ACE inhibitor use and ?hepatorenal >> baseline creatinine 0.79 from 11/26/12 - crt climbing again today - check renal US to be complete - my be approaching point of intravascular volume depletion - if crt climbs further in AM will have to consider IVF   Hyponatremia, mild likely due to cirrhosis >> improved  Hypokalemia, mild No replacement today in setting of climbing crt  ETOH cirrhosis with hypoalbuminemia Makes euvolemic state difficult to achieve - follow BP and crt closely  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 No evidence of spontaneous bleeding at this time  Mild hepatic encephalopathy improving - check ammonia in AM  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 and ascitic fluid leaking from wound site  wound care per CCS and wound care team - wound vac in place   Anemia of critical illness and chronic disease Hgb steady/improving - follow trend   DM type II CBG well controlled   Code Status: FULL Family Communication: spoke w/ pt and sisters at bedside Disposition Plan: stable for transfer to tele bed  Consultants: PCCM Gen Surgery ID  Procedures: 6/17 R arm PICC >>  6/18  Lt IJ CVL >> 6/23  Antibiotics: 6/14 bactrim >> 6/18 [facility])  6/17 Cipro >> 6/18 [facility]  6/18 imipenem >> 6/23  6/18 vanc >> 6/24 6/23 Ceftriaxone >>   DVT prophylaxis: SCDs + auto-anticoag  HPI/Subjective: The patient is alert and conversant.  She is mildly confused.  She denies any complaints whatsoever at this time.  Objective: Blood pressure 103/64, pulse 70, temperature 97.4 F (36.3 C),  temperature source Oral, resp. rate 20, height 5' 4.96" (1.65 m), weight 99.2 kg (218 lb 11.1 oz), SpO2 97.00%.  Intake/Output Summary (Last 24 hours) at 12/30/12 1606 Last data filed at 12/30/12 1200  Gross per 24 hour  Intake    530 ml  Output   1400 ml  Net   -870 ml   Exam: General: No acute respiratory distress at rest  Lungs: Distant breath sounds throughout all fields with poor air movement in bilateral bases with no wheeze Cardiovascular: Distant heart sounds - 2/6 holosystolic murmur - regular rate and rhythm Abdomen: Morbidly obese, midline vacuum dressing intact, nontender, soft, bowel sounds positive, no rebound, no appreciable mass Extremities: No significant cyanosis, or clubbing;  2+ edema bilateral lower extremities  Data Reviewed: Basic Metabolic Panel:  Recent Labs Lab 12/25/12 0420  12/26/12 0400 12/27/12 0400 12/28/12 0500 12/29/12 0500 12/30/12 0455  NA 125*  < > 128* 129* 133* 135 136  K 4.3  < > 3.6 2.8* 3.2* 3.9 3.5  CL 99  < > 99 101 103 102 102  CO2 16*  < > 15* 19 22 24 23   GLUCOSE 76  < > 221* 169* 171* 108* 89  BUN 24*  < > 28* 29* 31* 36* 42*  CREATININE 2.88*  < > 2.98* 2.94* 2.90* 3.04* 3.26*  CALCIUM 7.3*  < > 7.7* 7.6* 7.7* 8.0* 7.9*  MG 1.9  --   --   --   --   --   --   PHOS 5.7*  --   --   --   --   --  4.3  < > = values in this interval not displayed. Liver Function Tests:  Recent Labs Lab 12/27/2012 1610 12/25/12 0420 12/26/12 0400 12/27/12 0400 12/29/12 0500 12/30/12 0455  AST 137* 85* 87* 85* 102*  --   ALT 18 31 38* 41* 53*  --   ALKPHOS 117 124* 130* 111 124*  --   BILITOT 15.7*  15.7* 16.4* 17.6* 14.7* 13.3*  --   PROT 6.2 5.8* 5.7* 5.0* 5.0*  --   ALBUMIN 0.9* 1.1* 1.4* 1.5* 1.4* 1.4*    Recent Labs Lab 12/11/2012 1610  LIPASE 33    Recent Labs Lab 12/28/2012 1611  AMMONIA 64*   CBC:  Recent Labs Lab 12/27/2012 1610  12/26/12 0400 12/27/12 0400 12/28/12 0500 12/29/12 0500 12/30/12 0455  WBC 12.9*  < >  14.6* 8.6 12.3* 14.2* 18.3*  NEUTROABS 11.1*  --   --   --   --   --   --   HGB 9.2*  < > 8.3* 7.7* 8.5* 8.9* 9.5*  HCT 27.4*  < > 23.9* 23.5* 25.6* 26.1* 28.4*  MCV 93.5  < > 91.2 94.4 94.1 92.6 93.1  PLT 120*  < > 132* 74* 93* 88* 101*  < > = values in this interval not displayed. Cardiac Enzymes:  Recent Labs Lab 12/17/2012 2040  TROPONINI <0.30   CBG:  Recent Labs Lab 12/29/12 0018 12/29/12 0752 12/29/12 1628 12/29/12 2338 12/30/12  0735  GLUCAP 121* 94 109* 102* 77    Recent Results (from the past 240 hour(s))  URINE CULTURE     Status: None   Collection Time    Jan 18, 2013  8:32 PM      Result Value Range Status   Specimen Description URINE, CATHETERIZED   Final   Special Requests NONE   Final   Culture  Setup Time 2013/01/18 22:41   Final   Colony Count NO GROWTH   Final   Culture NO GROWTH   Final   Report Status 12/25/2012 FINAL   Final  MRSA PCR SCREENING     Status: None   Collection Time    2013/01/18  9:55 PM      Result Value Range Status   MRSA by PCR NEGATIVE  NEGATIVE Final   Comment:            The GeneXpert MRSA Assay (FDA     approved for NASAL specimens     only), is one component of a     comprehensive MRSA colonization     surveillance program. It is not     intended to diagnose MRSA     infection nor to guide or     monitor treatment for     MRSA infections.  URINE CULTURE     Status: None   Collection Time    12/25/12  1:07 PM      Result Value Range Status   Specimen Description URINE, CATHETERIZED   Final   Special Requests NONE   Final   Culture  Setup Time 12/25/2012 15:07   Final   Colony Count NO GROWTH   Final   Culture NO GROWTH   Final   Report Status 12/26/2012 FINAL   Final     Studies:  Recent x-ray studies have been reviewed in detail by the Attending Physician  Scheduled Meds:  Scheduled Meds: . cefTRIAXone (ROCEPHIN)  IV  1 g Intravenous Q24H  . folic acid  1 mg Oral Daily  . lactulose  10 g Oral Daily  .  levothyroxine  25 mcg Oral QAC breakfast  . midodrine  5 mg Oral TID WC  . multivitamin with minerals  1 tablet Oral Daily  . pantoprazole  40 mg Oral Q1200  . sodium chloride  10 mL Intravenous Q12H  . thiamine  100 mg Oral Daily    Time spent on care of this patient:   [facility] T  [facility] Office  [phone] Pager - Text Page per Loretha Stapler as per below:  On-Call/Text Page:      [email or web address]      password [email or web address]  If 7PM-7AM, please contact night-coverage [email or web address] Password [email or web address] 12/30/2012, 4:06 PM   LOS: 7 days

## 2012-12-30 NOTE — Progress Notes (Signed)
Clinical Social Work Department CLINICAL SOCIAL WORK PLACEMENT NOTE 12/30/2012  Patient:  [name] [name], [name] [name]  Account Number:  [id number] Admit date:  12/15/2012  Clinical Social Worker:  Leron Croak, CLINICAL SOCIAL WORKER  Date/time:  12/29/2012 02:56 PM  Clinical Social Work is seeking post-discharge placement for this patient at the following level of care:   SKILLED NURSING   (*CSW will update this form in Epic as items are completed)   12/29/2012  Patient/family provided with [facility] Department of Clinical Social Work's list of facilities offering this level of care within the geographic area requested by the patient (or if unable, by the patient's family).  12/29/2012  Patient/family informed of their freedom to choose among providers that offer the needed level of care, that participate in Medicare, Medicaid or managed care program needed by the patient, have an available bed and are willing to accept the patient.  12/29/2012  Patient/family informed of [facility]' ownership interest in [facility], as well as of the fact that they are under no obligation to receive care at this facility.  PASARR submitted to EDS on  PASARR number received from EDS on   FL2 transmitted to all facilities in geographic area requested by pt/family on  12/29/2012 FL2 transmitted to all facilities within larger geographic area on 12/29/2012  Patient informed that his/her managed care company has contracts with or will negotiate with  certain facilities, including the following:     Patient/family informed of bed offers received:  12/30/2012 Patient chooses bed at [facility] [place] Physician recommends and patient chooses bed at    Patient to be transferred to [facility] [place] on   Patient to be transferred to facility by [facility]  The following physician request were entered in Epic:   Additional Comments:   Leron Croak, [facility]  Emergency Dept.  [phone]

## 2012-12-31 LAB — COMPREHENSIVE METABOLIC PANEL
ALT: 53 U/L — ABNORMAL HIGH (ref 0–35)
AST: 102 U/L — ABNORMAL HIGH (ref 0–37)
CO2: 22 mEq/L (ref 19–32)
Chloride: 103 mEq/L (ref 96–112)
GFR calc Af Amer: 17 mL/min — ABNORMAL LOW (ref >90)
GFR calc non Af Amer: 14 mL/min — ABNORMAL LOW (ref >90)
Glucose, Bld: 113 mg/dL — ABNORMAL HIGH (ref 70–99)
Sodium: 135 mEq/L (ref 135–145)
Total Bilirubin: 14.7 mg/dL — ABNORMAL HIGH (ref 0.3–1.2)

## 2012-12-31 LAB — GLUCOSE, CAPILLARY
Glucose-Capillary: 110 mg/dL — ABNORMAL HIGH (ref 70–99)
Glucose-Capillary: 110 mg/dL — ABNORMAL HIGH (ref 70–99)

## 2012-12-31 LAB — CBC
MCH: 31.6 pg (ref 26.0–34.0)
MCHC: 32.8 g/dL (ref 30.0–36.0)
Platelets: 86 10*3/uL — ABNORMAL LOW (ref 150–400)
RDW: 24.2 % — ABNORMAL HIGH (ref 11.5–15.5)

## 2012-12-31 LAB — AMMONIA: Ammonia: 29 umol/L (ref 11–60)

## 2012-12-31 MED ORDER — POTASSIUM CHLORIDE CRYS ER 20 MEQ PO TBCR
40.0000 meq | EXTENDED_RELEASE_TABLET | Freq: Once | ORAL | Status: AC
  Administered 2012-12-31: 40 meq via ORAL
  Filled 2012-12-31: qty 2

## 2012-12-31 MED ORDER — ENSURE COMPLETE PO LIQD
237.0000 mL | Freq: Two times a day (BID) | ORAL | Status: DC
  Administered 2012-12-31 – 2013-01-04 (×8): 237 mL via ORAL

## 2012-12-31 MED ORDER — HYDROMORPHONE HCL 2 MG PO TABS
1.0000 mg | ORAL_TABLET | Freq: Two times a day (BID) | ORAL | Status: DC | PRN
  Administered 2012-12-31 – 2013-01-04 (×3): 2 mg via ORAL
  Filled 2012-12-31 (×2): qty 1
  Filled 2012-12-31: qty 2

## 2012-12-31 NOTE — Progress Notes (Signed)
Pt transferred to room 6712 from 3300. Pt is alert and oriented x 3-4. Jaundice in color, wound vac in abdomen patent. Pleasant to converse with. Edematous lower extremities 3-4+ and has excoriated bottom with minimal bleeding noted. EPC applied. Kept dry and comfortable. F/C intact with orange color urine.

## 2012-12-31 NOTE — Progress Notes (Signed)
Pt transferred to 6712 with RN and NT on monitor, CCM and elink aware. Receiving NT and RN at bedside and pt placed on telebox.

## 2012-12-31 NOTE — Progress Notes (Signed)
NUTRITION FOLLOW UP  DOCUMENTATION CODES  Per approved criteria   -Severe malnutrition in the context of chronic illness    Intervention:   1. Ensure Complete po BID, each supplement provides 350 kcal and 13 grams of protein. 2. RD to continue to follow nutrition care plan.  Nutrition Dx:   Inadequate oral intake related to poor appetite as evidenced by pt report, dietary recall and ongoing weight loss. Improving.  Goal:   Intake to meet >90% of estimated nutrition needs. Improved.  Monitor:   weight trends, lab trends, I/O's, PO intake, supplement tolerance  Assessment:   Admitted with alcoholic cirrhosis. From [facility]. Note pt was admitted to [facility] 5/7 - 5/29 for small bowel perforation.   Remains jaundiced. Edematous lower extremities 3-4+. Wound VAC remains on abdomen.  Question accuracy of weights and total fluid status - weight on admit was 151 lb - however now weights are between 214 - 219 lb.   Eating 50% of Carbohydrate Modified Medium meals. Pt reports that her oral intake is much better and is agreeable to Ensure Complete (was previously ordered and discontinued.)  Pt meets criteria for severe MALNUTRITION in the context of chronic illness as evidenced by 28% wt loss x 1 month and intake of <75% x at least 1 month.   Height: Ht Readings from Last 1 Encounters:  12/31/12 5\' 5"  (1.651 m)    Weight Status:   Wt Readings from Last 1 Encounters:  12/31/12 214 lb 1.1 oz (97.1 kg)    Re-estimated needs:  Kcal: 1975 - 2100 Protein: at least 90 g Fluid: per team  Skin:  Nonhealing midline wound; VAC placed 6/19  Diet Order: Carb Control Medium (1600 - 2000)   Intake/Output Summary (Last 24 hours) at 12/31/12 1050 Last data filed at 12/31/12 0900  Gross per 24 hour  Intake    240 ml  Output   1100 ml  Net   -860 ml    Last BM: 6/25   Labs:   Recent Labs Lab 12/25/12 0420  12/29/12 0500 12/30/12 0455 12/31/12 0455  NA 125*  < >  135 136 135  K 4.3  < > 3.9 3.5 3.4*  CL 99  < > 102 102 103  CO2 16*  < > 24 23 22   BUN 24*  < > 36* 42* 45*  CREATININE 2.88*  < > 3.04* 3.26* 3.19*  CALCIUM 7.3*  < > 8.0* 7.9* 7.7*  MG 1.9  --   --   --   --   PHOS 5.7*  --   --  4.3  --   GLUCOSE 76  < > 108* 89 113*  < > = values in this interval not displayed.  CBG (last 3)   Recent Labs  12/30/12 1708 12/30/12 2127 12/31/12 0751  GLUCAP 127* 112* 98    Scheduled Meds: . cefTRIAXone (ROCEPHIN)  IV  1 g Intravenous Q24H  . folic acid  1 mg Oral Daily  . lactulose  10 g Oral Daily  . levothyroxine  25 mcg Oral QAC breakfast  . midodrine  5 mg Oral TID WC  . multivitamin with minerals  1 tablet Oral Daily  . pantoprazole  40 mg Oral Q1200  . potassium chloride  40 mEq Oral Once  . thiamine  100 mg Oral Daily    Continuous Infusions:  none  Jarold Motto MS, RD, LDN Pager: [phone] After-hours pager: [phone]

## 2012-12-31 NOTE — Progress Notes (Signed)
Thank you for consulting the Palliative Medicine Team at [facility] to meet your patient's and family's needs.   The reason that you asked [place] to see your patient is for goals of care discussion  We have scheduled your patient for a meeting: tomorrow, Friday 6/27 @ 2:00 pm  The Surrogate decision maker is: spouse Gerlene Burdock c: [phone]  Other family members that need to be present: patient's husband will inform their daughter and son of meeting time and if available they will participate  Your patient is able/unable to participate: TBD -per notes patient has had some mild confusion- pleasant, alert on visit though defers most questions/discussion and asks this Clinical research associate to contact her husband    Valente David, RN 12/31/2012, 2:06 PM Palliative Medicine Team RN Liaison [phone]

## 2012-12-31 NOTE — Progress Notes (Addendum)
[facility] Progress Note [place] TEAM 1 - Stepdown/ICU TEAM   [name] [name] NWG:[id number] DOB: [birth year] DOA: 25-Dec-2012 PCP: Judie Petit, MD  Brief narrative: 67 y/o female with alcoholic cirrhosis admitted 6/18 through the [facility] ED for septic shock in the setting of a recent diagnosis of a "bloodstream infection".  She was admitted to [facility] with septic shock and respiratory failure from a small bowel perforation from 5/7 - 5/29. 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 She was discharged to [facility] for wound care, physical therapy, and speech therapy.  For the two weeks prior to this admission she experienced some difficulty sleeping, mild confusion, and worsening jaundice. The family was unaware of whether or not she had a fever, but apparently blood cultures were collected on 6/16 and were positive for GPC's in pairs and clusters. She was also believed to have a UTI. She was started on Septra for a UTI on 6/14 and this was stopped on 6/17. A PICC was placed on 6/17 and cipro IV was started. She went to her surgeons office for a wound check on 6/18 and had a normal blood pressure. After she returned to [facility] she was told to go to the ED because she had "low calcium that needed to be treated in the hospital". In the [facility] ED she was found to be notably hypotensive.   SIGNIFICANT EVENTS:  6/19 Add solu cortef, placed wound vac  6/20 Albumin given  6/21 Albumin given, added midodrine  6/23 Transfer to SDU  Assessment/Plan:  Septic shock resolved 6/22 per PCCM - no clear source identified per PCCM notes (UTI vs/ bacteremia vs/ colits vs/ SBP) - positive blood culture from the SNF (coag neg staph) - urine cx negative x2 but UA was grossly abnormal at admit - no qualifying signs/symptoms for sepsis at this time - continue to monitor clinically   Coagulase negative bacteremia   Blood cx repeated per ID 6/24 - per ID "consider getting echo if she has recurrent bacteremia due to concern for endocarditis" - vanc was d/c 6/24 per PCCM -6/24 bllod cultures neg to date, will follow Small pleural effusions >> in setting of cirrhosis, ascites, and low albumin state resp status remains stable, follow  Hx of HTN Pt is relatively hypotensive at this time   Acute renal failure likely from septic shock and ACE inhibitor use and ?hepatorenal >> baseline creatinine 0.79 from 11/26/12 - cr trending up 6/25 -renal US neg  -cr beginning to trend down today, will hold off ivf for now given her cirrhosis with hypoalbuminemia>> continue to trend and further manage accordingly  Hyponatremia, mild likely due to cirrhosis >> improved  Hypokalemia, mild Replace k  ETOH cirrhosis with hypoalbuminemia Makes euvolemic state difficult to achieve - follow BP and cr closely  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  I have consulted palliative for goals.  Code Status: FULL Family Communication: spoke w/ pt and husband at bedside Disposition Plan: pending clinical course  Consultants: PCCM Gen Surgery ID  Procedures: 6/17 R arm PICC >>  6/18 Lt IJ CVL >> 6/23  Antibiotics: 6/14 bactrim >> 6/18 [facility])  6/17 Cipro >> 6/18 [facility])  6/18 imipenem >> 6/23  6/18 vanc >> 6/24 6/23 Ceftriaxone  >>   DVT prophylaxis: SCDs + auto-anticoag  HPI/Subjective: The patient is alert and conversant.  She denies any complaints at this time. Husband at bedside.  Objective: Blood pressure 116/52, pulse 91, temperature 97.3 F (36.3 C), temperature source Oral, resp. rate 16, height 5\' 5"  (1.651 m), weight 97.1 kg (214 lb 1.1 oz), SpO2 98.00%.  Intake/Output Summary (Last 24 hours) at 12/31/12 1032 Last data filed at 12/31/12 0900  Gross per 24 hour  Intake    240 ml  Output   1100 ml  Net   -860 ml   Exam: General: No acute respiratory distress at rest  Lungs: Distant breath sounds throughout all fields with poor air movement in bilateral bases with no wheeze Cardiovascular: Distant heart sounds - 2/6 holosystolic murmur - regular rate and rhythm Abdomen: Morbidly obese, midline vacuum dressing intact, nontender, soft, bowel sounds positive, no rebound, no appreciable mass Extremities: No significant cyanosis, or clubbing;  + edema bilateral lower extremities  Data Reviewed: Basic Metabolic Panel:  Recent Labs Lab 12/25/12 0420  12/27/12 0400 12/28/12 0500 12/29/12 0500 12/30/12 0455 12/31/12 0455  NA 125*  < > 129* 133* 135 136 135  K 4.3  < > 2.8* 3.2* 3.9 3.5 3.4*  CL 99  < > 101 103 102 102 103  CO2 16*  < > 19 22 24 23 22   GLUCOSE 76  < > 169* 171* 108* 89 113*  BUN 24*  < > 29* 31* 36* 42* 45*  CREATININE 2.88*  < > 2.94* 2.90* 3.04* 3.26* 3.19*  CALCIUM 7.3*  < > 7.6* 7.7* 8.0* 7.9* 7.7*  MG 1.9  --   --   --   --   --   --   PHOS 5.7*  --   --   --   --  4.3  --   < > = values in this interval not displayed. Liver Function Tests:  Recent Labs Lab 12/25/12 0420 12/26/12 0400 12/27/12 0400 12/29/12 0500 12/30/12 0455 12/31/12 0455  AST 85* 87* 85* 102*  --  102*  ALT 31 38* 41* 53*  --  53*  ALKPHOS 124* 130* 111 124*  --  130*  BILITOT 16.4* 17.6* 14.7* 13.3*  --  14.7*  PROT 5.8* 5.7* 5.0* 5.0*  --  4.7*  ALBUMIN 1.1* 1.4* 1.5* 1.4* 1.4* 1.2*   No  results found for this basename: LIPASE, AMYLASE,  in the last 168 hours  Recent Labs Lab 12/31/12 0455  AMMONIA 29   CBC:  Recent Labs Lab 12/27/12 0400 12/28/12 0500 12/29/12 0500 12/30/12 0455 12/31/12 0455  WBC 8.6 12.3* 14.2* 18.3* 15.6*  HGB 7.7* 8.5* 8.9* 9.5* 9.5*  HCT 23.5* 25.6* 26.1* 28.4* 29.0*  MCV 94.4 94.1 92.6 93.1 96.3  PLT 74* 93* 88* 101* 86*   Cardiac Enzymes: No results found for this basename: CKTOTAL, CKMB, CKMBINDEX, TROPONINI,  in the last 168 hours CBG:  Recent Labs Lab 12/29/12 2338 12/30/12 0735 12/30/12 1708 12/30/12 2127 12/31/12 0751  GLUCAP 102* 77 127* 112* 98  Recent Results (from the past 240 hour(s))  URINE CULTURE     Status: None   Collection Time    12/28/2012  8:32 PM      Result Value Range Status   Specimen Description URINE, CATHETERIZED   Final   Special Requests NONE   Final   Culture  Setup Time 12/18/2012 22:41   Final   Colony Count NO GROWTH   Final   Culture NO GROWTH   Final   Report Status 12/25/2012 FINAL   Final  MRSA PCR SCREENING     Status: None   Collection Time    01/03/2013  9:55 PM      Result Value Range Status   MRSA by PCR NEGATIVE  NEGATIVE Final   Comment:            The GeneXpert MRSA Assay (FDA     approved for NASAL specimens     only), is one component of a     comprehensive MRSA colonization     surveillance program. It is not     intended to diagnose MRSA     infection nor to guide or     monitor treatment for     MRSA infections.  URINE CULTURE     Status: None   Collection Time    12/25/12  1:07 PM      Result Value Range Status   Specimen Description URINE, CATHETERIZED   Final   Special Requests NONE   Final   Culture  Setup Time 12/25/2012 15:07   Final   Colony Count NO GROWTH   Final   Culture NO GROWTH   Final   Report Status 12/26/2012 FINAL   Final  CULTURE, BLOOD (ROUTINE X 2)     Status: None   Collection Time    12/29/12  4:43 PM      Result Value Range Status    Specimen Description BLOOD LEFT HAND   Final   Special Requests BOTTLES DRAWN AEROBIC ONLY 3CC   Final   Culture  Setup Time 12/30/2012 04:07   Final   Culture     Final   Value:        BLOOD CULTURE RECEIVED NO GROWTH TO DATE CULTURE WILL BE HELD FOR 5 DAYS BEFORE ISSUING A FINAL NEGATIVE REPORT   Report Status PENDING   Incomplete  CULTURE, BLOOD (ROUTINE X 2)     Status: None   Collection Time    12/29/12  4:43 PM      Result Value Range Status   Specimen Description BLOOD LEFT ARM   Final   Special Requests BOTTLES DRAWN AEROBIC ONLY 1.5CC   Final   Culture  Setup Time 12/30/2012 04:07   Final   Culture     Final   Value:        BLOOD CULTURE RECEIVED NO GROWTH TO DATE CULTURE WILL BE HELD FOR 5 DAYS BEFORE ISSUING A FINAL NEGATIVE REPORT   Report Status PENDING   Incomplete     Studies:   I have reviewed all recent imaging studies in detail   Scheduled Meds:  Scheduled Meds: . cefTRIAXone (ROCEPHIN)  IV  1 g Intravenous Q24H  . folic acid  1 mg Oral Daily  . lactulose  10 g Oral Daily  . levothyroxine  25 mcg Oral QAC breakfast  . midodrine  5 mg Oral TID WC  . multivitamin with minerals  1 tablet Oral Daily  . pantoprazole  40 mg Oral Q1200  . potassium chloride  40 mEq Oral Once  . thiamine  100 mg Oral Daily    Time spent on care of this patient:   Janziel Hockett [phone] [facility] Office  [phone] Pager - Text Page per Loretha Stapler as per below:  On-Call/Text Page:      [email or web address]      password [email or web address]  If 7PM-7AM, please contact night-coverage [email or web address] Password [facility] 12/31/2012, 10:32 AM   LOS: 8 days

## 2012-12-31 NOTE — Evaluation (Signed)
Occupational Therapy Evaluation Patient Details Name: [name] [name] MRN: [id number] DOB: [birth year] Today's Date: 12/31/2012 Time: 4098-1191 OT Time Calculation (min): 40 min  OT Assessment / Plan / Recommendation History of present illness 67 yo female with septic shock and hypotension from SNF.  She has multiple medical problems including recent hospitalization for perforated bowel resulting in wound vac, resp failure and deconditioning.  Pt also with sequelae of liver cirrhosis with jaundice   Clinical Impression   Pt currently needs total assist +2 for selfcare tasks sit to stand as well as toilet transfers and toileting.  Demonstrates decreased overall endurance as well.  Will benefit from acute care OT to help increase overall independence prior to return to SNF level rehab.    OT Assessment  Patient needs continued OT Services    Follow Up Recommendations  SNF       Equipment Recommendations  None recommended by OT       Frequency  Min 2X/week    Precautions / Restrictions Precautions Precautions: Fall Precaution Comments: pt with diarrhea and excoriated skin on buttocks Restrictions Weight Bearing Restrictions: No   Pertinent Vitals/Pain O2 sats 96%, pt with no report of pain when asked at beginning of session    ADL  Eating/Feeding: Performed;Independent Where Assessed - Eating/Feeding: Chair Grooming: Simulated;Set up Where Assessed - Grooming: Unsupported sitting Upper Body Bathing: Simulated;Set up Where Assessed - Upper Body Bathing: Unsupported sitting Lower Body Bathing: +2 Total assistance Lower Body Bathing: Patient Percentage: 40% Where Assessed - Lower Body Bathing: Supported sit to stand Upper Body Dressing: Simulated;Set up Where Assessed - Upper Body Dressing: Unsupported sitting Lower Body Dressing: +2 Total assistance;Performed Lower Body Dressing: Patient Percentage: 40% Where Assessed - Lower Body Dressing: Supported sit to stand Toilet  Transfer: Simulated;Moderate assistance Toilet Transfer Method: Stand pivot Acupuncturist: Materials engineer and Hygiene: Performed;+1 Total assistance Toileting - Architect and Hygiene: Patient Percentage: 40% Where Assessed - Toileting Clothing Manipulation and Hygiene: Supine, head of bed flat;Rolling right and/or left Tub/Shower Transfer Method: Not assessed Transfers/Ambulation Related to ADLs: Pt needing mod assist for sit to stand and to take 2-3 small steps around to the bedside chair.   ADL Comments: Pt incontinent of bowel at beginning of session.  Performed rolling in the bed with min assist while therapist cleaned up linens and pt.  Noted pt with severe redness and open areas on her bottom as well.  Nursing made aware and barrier cream applied.  Pt needing max assist from sidelying to reach sitting position.  Unable to reach down to her feet for donning gripper socks this session.  Also noted increased swelling in feet as well with some weeping noted in the anterior aspect of the lower leg.      OT Diagnosis: Generalized weakness  OT Problem List: Decreased strength;Decreased activity tolerance;Impaired balance (sitting and/or standing);Pain OT Treatment Interventions: Self-care/ADL training;DME and/or AE instruction;Therapeutic activities;Patient/family education;Balance training   OT Goals(Current goals can be found in the care plan section) Acute Rehab OT Goals Patient Stated Goal: Pt did not state but was excited and happy about getting out of the bed. OT Goal Formulation: With patient Time For Goal Achievement: 01/14/13 Potential to Achieve Goals: Good  Visit Information  Last OT Received On: 12/31/12 Assistance Needed: +2 History of Present Illness: 67 yo female with septic shock and hypotension from SNF.  She has multiple medical problems including recent hospitalization for perforated bowel resulting in wound vac,  resp failure and deconditioning.  Pt also with sequelae of liver cirrhosis with jaundice       Prior Functioning     Home Living Family/patient expects to be discharged to:: Skilled nursing facility         Vision/Perception Vision - History Baseline Vision: Wears glasses all the time Patient Visual Report: No change from baseline Vision - Assessment Eye Alignment: Within Functional Limits Vision Assessment: Vision not tested Perception Perception: Within Functional Limits Praxis Praxis: Intact   Cognition  Cognition Arousal/Alertness: Awake/alert Behavior During Therapy: WFL for tasks assessed/performed Overall Cognitive Status: Impaired/Different from baseline Area of Impairment: Orientation Orientation Level: Time Current Attention Level: Selective Memory: Decreased short-term memory    Extremity/Trunk Assessment Upper Extremity Assessment Upper Extremity Assessment: Generalized weakness Lower Extremity Assessment Lower Extremity Assessment: Defer to PT evaluation Cervical / Trunk Assessment Cervical / Trunk Assessment: Normal     Mobility Bed Mobility Bed Mobility: Rolling Right;Rolling Left;Left Sidelying to Sit Rolling Right: 4: Min assist;With rail Rolling Left: 4: Min assist;With rail Left Sidelying to Sit: 2: Max assist;With rails;HOB flat Transfers Transfers: Sit to Stand Sit to Stand: 1: +2 Total assist;Without upper extremity assist;From bed Sit to Stand: Patient Percentage: 50% Stand to Sit: 1: +2 Total assist;To bed;With upper extremity assist        Balance Balance Balance Assessed: Yes Static Sitting Balance Static Sitting - Balance Support: Bilateral upper extremity supported Static Sitting - Level of Assistance: 5: Stand by assistance Static Standing Balance Static Standing - Balance Support: Right upper extremity supported;Left upper extremity supported Static Standing - Level of Assistance: 3: Mod assist   End of Session OT - End  of Session Activity Tolerance: Patient limited by fatigue Patient left: in chair;with call bell/phone within reach;with family/visitor present Nurse Communication: Mobility status     Charice Zuno OTR/L Pager number [phone] 12/31/2012, 3:42 PM

## 2012-12-31 NOTE — Consult Note (Signed)
WOC consult Note Reason for Consult: reddened buttocks. Pt incontinent of B/B, buttocks are staying moist.  Implemented zinc based barrier  cream this am, this would aid in protection of the area from further injury.  Will add air mattress for moisture control also.  Wound type:bilateral buttocks MASD (mositure associated skin damage) Dressing procedure/placement/frequency:  Zinc based barrier cream and LALM overlay  WOC following along as needed for assistance with VAC and wound care.  Broderic Bara [place], [place] [phone]

## 2013-01-01 DIAGNOSIS — D696 Thrombocytopenia, unspecified: Secondary | ICD-10-CM

## 2013-01-01 DIAGNOSIS — M549 Dorsalgia, unspecified: Secondary | ICD-10-CM

## 2013-01-01 DIAGNOSIS — D689 Coagulation defect, unspecified: Secondary | ICD-10-CM

## 2013-01-01 DIAGNOSIS — Z515 Encounter for palliative care: Secondary | ICD-10-CM

## 2013-01-01 DIAGNOSIS — K631 Perforation of intestine (nontraumatic): Secondary | ICD-10-CM

## 2013-01-01 LAB — CBC
Hemoglobin: 9.2 g/dL — ABNORMAL LOW (ref 12.0–15.0)
MCH: 32.5 pg (ref 26.0–34.0)
MCHC: 33.2 g/dL (ref 30.0–36.0)
Platelets: 87 10*3/uL — ABNORMAL LOW (ref 150–400)
RDW: 24.2 % — ABNORMAL HIGH (ref 11.5–15.5)

## 2013-01-01 LAB — BASIC METABOLIC PANEL
Calcium: 7.7 mg/dL — ABNORMAL LOW (ref 8.4–10.5)
GFR calc Af Amer: 17 mL/min — ABNORMAL LOW (ref >90)
GFR calc non Af Amer: 14 mL/min — ABNORMAL LOW (ref >90)
Glucose, Bld: 117 mg/dL — ABNORMAL HIGH (ref 70–99)
Sodium: 137 mEq/L (ref 135–145)

## 2013-01-01 LAB — GLUCOSE, CAPILLARY
Glucose-Capillary: 102 mg/dL — ABNORMAL HIGH (ref 70–99)
Glucose-Capillary: 147 mg/dL — ABNORMAL HIGH (ref 70–99)

## 2013-01-01 MED ORDER — ONDANSETRON HCL 4 MG/2ML IJ SOLN: 4.0000 mg | Freq: Four times a day (QID) | INTRAMUSCULAR | Status: DC | PRN

## 2013-01-01 MED ORDER — ONDANSETRON HCL 4 MG/2ML IJ SOLN
INTRAMUSCULAR | Status: AC
  Administered 2013-01-01: 4 mg
  Filled 2013-01-01: qty 2

## 2013-01-01 NOTE — Consult Note (Signed)
Patient UE:[id number] B Vallandingham      DOB: 02-19-1946      JYN:[id number]     Consult Note from the Palliative Medicine Team at [facility]    Consult Requested by: Dr Donna Bernard     PCP: Judie Petit, MD Reason for Consultation:Goals of Care     Phone Number:[phone]  Assessment of patients Current state: Patient alert, sitting in bedside chair, answers simple questions appropriately, does have bouts of confusion, lacks insight, reasoning and judgement due to progressive liver disease encephalopathy. Per staff appetite has been minimal, continues to take fair amount of fluids orally, incontinent of bowel, foley catheter in place.  Reviewed chart, spoke with staff caring for patient and proceeded to have Goals of care conference with patients spouse, Gerlene Burdock [facility]), patients daughter, Idelle Jo and patients son, Gerlene Burdock.  Family discussed patients notable decline mentally and physically over past 2 years, (had 4 major abdominal surgeries, most recently for perforated jejunum 5/14, with several bouts of septic bacterial peritonitis), now with ESLD with encephalopathy (history of ETOH abuse).  We talked about desires regarding Goals of care for patient, in context of her progressed liver disease, poor nutritional status, and limited ability for wound healing (unhealed large abdominal wound incision from surgery 11/2012).  We discussed support services from palliative care and hospice, and also discussed family preferences regarding current and ongoing medical interventions by way of Medical Orders for Scope of Treatment form.   Dr Donna Bernard aware of family decision, also social worker involved in disposition discussion  Goals of Care: 1.  Code Status: DNR/DNI   Decision made to continue current level of care, including blood draws, CBG monitoring until discharge at which point transition to full comfort measures.    2. Scope of Treatment: 1. Vital Signs:  routine  2. Respiratory/Oxygen: as idicated 3. Nutritional Support/Tube Feeds: No 4. Antibiotics: Yes for identified infections 5. Blood Products: if required during this hospitalization only 6. IVF: No 7. Review of Medications to be discontinued: none 8. Labs: as indicated during this hospitalization  9. Telemetry: as indicated during this hospitalizations 10. Consults: spiritual   4. Disposition: plan is for evaluation for eligibility to residential hospice, if ineligible would consider discharge home with hospice support with transition to residential with decline   3. Symptom Management:  1. Pain:Dilaudid 1-2 mg oral twice daily as needed 2. Bowel Regimen: Lactulose 10 G daily  4. Psychosocial: Emotional support and assistance to family in goals of care decision making process  5. Spiritual: consult placed  Patient Documents Completed or Given: Document Given Completed  Advanced Directives Pkt    MOST  YES  DNR  YES  Gone from My Sight    Hard Choices  None available    Brief HPI: Patient is 67 yo WF with complicated medical history, as indicated above. 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 Recently admitted from [facility] 12/11/2012 for sepsis and abdominal pain.   ROS: + decreased appetite, +poor oral intake, + intermittent back pain, + rectal bleeding denies n/v  PMH:  Past Medical History  Diagnosis Date  . Hypertension   . H/O brain surgery   . Aneurysm of anterior cerebral artery   . Hernia   . Anxiety   . Stroke   . Anemia   . Arthritis   . DM2 (diabetes mellitus, type 2) 12/28/2012     PSH: Past Surgical History  Procedure Laterality Date  . Laparotomy  01/22/2012    Procedure: EXPLORATORY LAPAROTOMY;  Surgeon: Emelia Loron, MD;  Location: [facility] OR;  Service: General;  Laterality: N/A;  lysis of adhesions  . Hernia repair    . Esophagogastroduodenoscopy N/A 09/19/2012     Procedure: ESOPHAGOGASTRODUODENOSCOPY (EGD);  Surgeon: Charna Monta, MD;  Location: [facility] ENDOSCOPY;  Service: Endoscopy;  Laterality: N/A;  . Colonoscopy N/A 09/20/2012    Procedure: COLONOSCOPY;  Surgeon: Charna Sarabella, MD;  Location: [facility] ENDOSCOPY;  Service: Endoscopy;  Laterality: N/A;  . Givens capsule study N/A 09/21/2012    Procedure: GIVENS CAPSULE STUDY;  Surgeon: Charna Kerry, MD;  Location: [facility] ENDOSCOPY;  Service: Endoscopy;  Laterality: N/A;  . Enteroscopy N/A 09/28/2012    Procedure: ENTEROSCOPY;  Surgeon: Rachael Fee, MD;  Location: [facility] ENDOSCOPY;  Service: Endoscopy;  Laterality: N/A;  . Upper gastrointestinal endoscopy  10/05/12  . Enteroscopy N/A 10/05/2012    Procedure: ENTEROSCOPY;  Surgeon: Meryl Dare, MD;  Location: [facility] ENDOSCOPY;  Service: Endoscopy;  Laterality: N/A;  . Laparotomy N/A 11/12/2012    Procedure: EXPLORATORY LAPAROTOMY  with repair perforated small bowel, abdominal wall debridement, application of wound vac dressing;  Surgeon: Ernestene Mention, MD;  Location: [facility] ORS;  Service: General;  Laterality: N/A;  . Wound debridement  11/14/2012    Procedure: Washout Abdominal Wound, debridement of Fascia, Partial Closure;  Surgeon: Ardeth Sportsman, MD;  Location: [facility] ORS;  Service: General;;   I have reviewed the FH and SH and  If appropriate update it with new information. Allergies  Allergen Reactions  . Erythromycin Swelling  . Etomidate     Myoclonus side effect pronounced  . Cephalexin Rash  . Penicillins Rash   Scheduled Meds: . cefTRIAXone (ROCEPHIN)  IV  1 g Intravenous Q24H  . feeding supplement  237 mL Oral BID BM  . folic acid  1 mg Oral Daily  . lactulose  10 g Oral Daily  . levothyroxine  25 mcg Oral QAC breakfast  . midodrine  5 mg Oral TID WC  . multivitamin with minerals  1 tablet Oral Daily  . pantoprazole  40 mg Oral Q1200  . thiamine  100 mg Oral Daily   Continuous Infusions:  PRN Meds:.sodium chloride, HYDROmorphone, sodium chloride    BP  99/64  Pulse 78  Temp(Src) 97.4 F (36.3 C) (Oral)  Resp 18  Ht 5\' 5"  (1.651 m)  Wt 97.5 kg (214 lb 15.2 oz)  BMI 35.77 kg/m2  SpO2 95%   PPS:30%          12/26/12 Albumin 1.2    12/31/12 Creat: 3.18  12/30/12 INR 2.87   Intake/Output Summary (Last 24 hours) at 01/01/13 1621 Last data filed at 01/01/13 1540  Gross per 24 hour  Intake 2136.33 ml  Output   1675 ml  Net 461.33 ml   LBM: 12/26/12          Physical Exam:  General: Alert, but pleasantly confused HEENT: sclera jaundiced, petechiae on lateral tongue, mouth moist Chest:   CTA CVS: RRR Abdomen:distended, mid-line abdominal wound, dressing dry and intact-wound vac d/w amber colored fluid-BS audible SKIN: jaundiced Ext: 2- 3+ pedal and leg edema Neuro: pleasantly confused aware of place, recognizes family  Labs: CBC    Component Value Date/Time   WBC 15.4* 01/01/2013 0525   RBC 2.83* 01/01/2013 0525   HGB 9.2* 01/01/2013 0525   HCT 27.7* 01/01/2013 0525   PLT 87* 01/01/2013 0525   MCV 97.9 01/01/2013 0525   MCH  32.5 01/01/2013 0525   MCHC 33.2 01/01/2013 0525   RDW 24.2* 01/01/2013 0525   LYMPHSABS 0.8 01/19/2013 1610   MONOABS 0.9 01-19-13 1610   EOSABS 0.1 01-19-13 1610   BASOSABS 0.0 01-19-13 1610    BMET    Component Value Date/Time   NA 137 01/01/2013 0525   K 3.8 01/01/2013 0525   CL 106 01/01/2013 0525   CO2 24 01/01/2013 0525   GLUCOSE 117* 01/01/2013 0525   BUN 48* 01/01/2013 0525   CREATININE 3.18* 01/01/2013 0525   CALCIUM 7.7* 01/01/2013 0525   GFRNONAA 14* 01/01/2013 0525   GFRAA 17* 01/01/2013 0525    CMP     Component Value Date/Time   NA 137 01/01/2013 0525   K 3.8 01/01/2013 0525   CL 106 01/01/2013 0525   CO2 24 01/01/2013 0525   GLUCOSE 117* 01/01/2013 0525   BUN 48* 01/01/2013 0525   CREATININE 3.18* 01/01/2013 0525   CALCIUM 7.7* 01/01/2013 0525   PROT 4.7* 12/31/2012 0455   ALBUMIN 1.2* 12/31/2012 0455   AST 102* 12/31/2012 0455   ALT 53* 12/31/2012 0455   ALKPHOS 130* 12/31/2012 0455    BILITOT 14.7* 12/31/2012 0455   GFRNONAA 14* 01/01/2013 0525   GFRAA 17* 01/01/2013 0525      Time In Time Out Total Time Spent with Patient Total Overall Time  2:00p 4:00 30 min 120 min    Greater than 50%  of this time was spent counseling and coordinating care related to the above assessment and plan.  Freddie Breech, CNS-C Palliative Medicine Team [facility] Team Phone: [phone] Pager: [phone]

## 2013-01-01 NOTE — Progress Notes (Addendum)
[facility] Progress Note    [name] [name]:[id number] DOB: 17-Feb-1946 DOA: 12/17/2012 PCP: Judie Petit, MD  Brief narrative: 67 y/o female with alcoholic cirrhosis admitted 6/18 through the [facility] ED for septic shock in the setting of a recent diagnosis of a "bloodstream infection".  She was admitted to [facility] with septic shock and respiratory failure from a small bowel perforation from 5/7 - 5/29. 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 She was discharged to [facility] for wound care, physical therapy, and speech therapy.  For the two weeks prior to this admission she experienced some difficulty sleeping, mild confusion, and worsening jaundice. The family was unaware of whether or not she had a fever, but apparently blood cultures were collected on 6/16 and were positive for GPC's in pairs and clusters. She was also believed to have a UTI. She was started on Septra for a UTI on 6/14 and this was stopped on 6/17. A PICC was placed on 6/17 and cipro IV was started. She went to her surgeons office for a wound check on 6/18 and had a normal blood pressure. After she returned to [facility] she was told to go to the ED because she had "low calcium that needed to be treated in the hospital". In the [facility] ED she was found to be notably hypotensive.   SIGNIFICANT EVENTS:  6/19 Add solu cortef, placed wound vac  6/20 Albumin given  6/21 Albumin given, added midodrine  6/23 Transfer to SDU  Assessment/Plan:  Septic shock resolved 6/22 per PCCM - no clear source identified per PCCM notes (UTI vs/ bacteremia vs/ colits vs/ SBP) - positive blood culture from the SNF (coag neg staph) - urine cx negative x2 but UA was grossly abnormal at admit - no qualifying signs/symptoms for sepsis at this time - continue to monitor clinically   Coagulase negative bacteremia  Blood cx repeated per ID 6/24 - per  ID "consider getting echo if she has recurrent bacteremia due to concern for endocarditis" - vanc was d/c 6/24 per PCCM -6/24 blood cultures neg to date, will follow Small pleural effusions >> in setting of cirrhosis, ascites, and low albumin state resp status remains stable, follow  Hx of HTN Pt is relatively hypotensive at this time   Acute renal failure likely from septic shock and ACE inhibitor use and ?hepatorenal >> baseline creatinine 0.79 from 11/26/12 - cr trending up 6/25 -renal US neg  -cr gradually trending down, holding off ivf for now given her cirrhosis with hypoalbuminemia>> continue to trend and further manage accordingly  Hyponatremia, mild likely due to cirrhosis >> improved  Hypokalemia, mild Resolved  ETOH cirrhosis with hypoalbuminemia Makes euvolemic state difficult to achieve - continue to follow BP and cr closely  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 No evidence of spontaneous bleeding at this time  Mild hepatic encephalopathy improving - last ammonia ileval 6/26- 29  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 and ascitic fluid leaking from wound site  wound care per [facility] and wound care team - wound vac remains in place   Anemia of critical illness and chronic disease Hgb stable - follow trend   DM type II CBG well controlled   Dyspepsia -continue PPI   husband was at bedside on 6/26 and voiced concern about her multiiple hospitalizations and overall decline, so palliative care for goals was discussed and he agreed to this as well as does pt.  -palliative  goals meeting planned this pm.  Code Status: FULL Family Communication: spoke w/ pt and husband at bedside Disposition Plan: pending clinical course, await palliative meeting  Consultants: PCCM Gen Surgery ID  Procedures: 6/17 R arm PICC >>  6/18 Lt IJ CVL >> 6/23  Antibiotics: 6/14 bactrim >> 6/18 ([facility])  6/17 Cipro >> 6/18 [facility])  6/18 imipenem >> 6/23   6/18 vanc >> 6/24 6/23 Ceftriaxone >>   DVT prophylaxis: SCDs + auto-anticoag  HPI/Subjective: The patient is alert and conversant.  C/o indigestion following breakfast this am.   Objective: Blood pressure 102/61, pulse 83, temperature 97.5 F (36.4 C), temperature source Oral, resp. rate 18, height 5\' 5"  (1.651 m), weight 97.5 kg (214 lb 15.2 oz), SpO2 94.00%.  Intake/Output Summary (Last 24 hours) at 01/01/13 1105 Last data filed at 01/01/13 0600  Gross per 24 hour  Intake    290 ml  Output   1925 ml  Net  -1635 ml   Exam: General: No acute respiratory distress at rest  Lungs: Distant breath sounds throughout all fields with poor air movement in bilateral bases with no wheeze Cardiovascular: Distant heart sounds - 2/6 holosystolic murmur - regular rate and rhythm Abdomen: Morbidly obese, midline vacuum dressing intact, nontender, soft, bowel sounds positive, no rebound, no appreciable mass Extremities: No significant cyanosis, or clubbing;  +2-3 edema bilateral lower extremities  Data Reviewed: Basic Metabolic Panel:  Recent Labs Lab 12/28/12 0500 12/29/12 0500 12/30/12 0455 12/31/12 0455 01/01/13 0525  NA 133* 135 136 135 137  K 3.2* 3.9 3.5 3.4* 3.8  CL 103 102 102 103 106  CO2 22 24 23 22 24   GLUCOSE 171* 108* 89 113* 117*  BUN 31* 36* 42* 45* 48*  CREATININE 2.90* 3.04* 3.26* 3.19* 3.18*  CALCIUM 7.7* 8.0* 7.9* 7.7* 7.7*  PHOS  --   --  4.3  --   --    Liver Function Tests:  Recent Labs Lab 12/26/12 0400 12/27/12 0400 12/29/12 0500 12/30/12 0455 12/31/12 0455  AST 87* 85* 102*  --  102*  ALT 38* 41* 53*  --  53*  ALKPHOS 130* 111 124*  --  130*  BILITOT 17.6* 14.7* 13.3*  --  14.7*  PROT 5.7* 5.0* 5.0*  --  4.7*  ALBUMIN 1.4* 1.5* 1.4* 1.4* 1.2*   No results found for this basename: LIPASE, AMYLASE,  in the last 168 hours  Recent Labs Lab 12/31/12 0455  AMMONIA 29   CBC:  Recent Labs Lab 12/28/12 0500 12/29/12 0500 12/30/12 0455  12/31/12 0455 01/01/13 0525  WBC 12.3* 14.2* 18.3* 15.6* 15.4*  HGB 8.5* 8.9* 9.5* 9.5* 9.2*  HCT 25.6* 26.1* 28.4* 29.0* 27.7*  MCV 94.1 92.6 93.1 96.3 97.9  PLT 93* 88* 101* 86* 87*   Cardiac Enzymes: No results found for this basename: CKTOTAL, CKMB, CKMBINDEX, TROPONINI,  in the last 168 hours CBG:  Recent Labs Lab 12/31/12 0751 12/31/12 1149 12/31/12 1711 12/31/12 2153 01/01/13 0759  GLUCAP 98 110* 110* 135* 104*    Recent Results (from the past 240 hour(s))  URINE CULTURE     Status: None   Collection Time    01/20/2013  8:32 PM      Result Value Range Status   Specimen Description URINE, CATHETERIZED   Final   Special Requests NONE   Final   Culture  Setup Time 01/20/13 22:41   Final   Colony Count NO GROWTH   Final   Culture NO  GROWTH   Final   Report Status 12/25/2012 FINAL   Final  MRSA PCR SCREENING     Status: None   Collection Time    January 14, 2013  9:55 PM      Result Value Range Status   MRSA by PCR NEGATIVE  NEGATIVE Final   Comment:            The GeneXpert MRSA Assay (FDA     approved for NASAL specimens     only), is one component of a     comprehensive MRSA colonization     surveillance program. It is not     intended to diagnose MRSA     infection nor to guide or     monitor treatment for     MRSA infections.  URINE CULTURE     Status: None   Collection Time    12/25/12  1:07 PM      Result Value Range Status   Specimen Description URINE, CATHETERIZED   Final   Special Requests NONE   Final   Culture  Setup Time 12/25/2012 15:07   Final   Colony Count NO GROWTH   Final   Culture NO GROWTH   Final   Report Status 12/26/2012 FINAL   Final  CULTURE, BLOOD (ROUTINE X 2)     Status: None   Collection Time    12/29/12  4:43 PM      Result Value Range Status   Specimen Description BLOOD LEFT HAND   Final   Special Requests BOTTLES DRAWN AEROBIC ONLY 3CC   Final   Culture  Setup Time 12/30/2012 04:07   Final   Culture     Final   Value:         BLOOD CULTURE RECEIVED NO GROWTH TO DATE CULTURE WILL BE HELD FOR 5 DAYS BEFORE ISSUING A FINAL NEGATIVE REPORT   Report Status PENDING   Incomplete  CULTURE, BLOOD (ROUTINE X 2)     Status: None   Collection Time    12/29/12  4:43 PM      Result Value Range Status   Specimen Description BLOOD LEFT ARM   Final   Special Requests BOTTLES DRAWN AEROBIC ONLY 1.5CC   Final   Culture  Setup Time 12/30/2012 04:07   Final   Culture     Final   Value:        BLOOD CULTURE RECEIVED NO GROWTH TO DATE CULTURE WILL BE HELD FOR 5 DAYS BEFORE ISSUING A FINAL NEGATIVE REPORT   Report Status PENDING   Incomplete     Studies:   I have reviewed all recent imaging studies in detail   Scheduled Meds:  Scheduled Meds: . cefTRIAXone (ROCEPHIN)  IV  1 g Intravenous Q24H  . feeding supplement  237 mL Oral BID BM  . folic acid  1 mg Oral Daily  . lactulose  10 g Oral Daily  . levothyroxine  25 mcg Oral QAC breakfast  . midodrine  5 mg Oral TID WC  . multivitamin with minerals  1 tablet Oral Daily  . pantoprazole  40 mg Oral Q1200  . thiamine  100 mg Oral Daily    Time spent on care of this patient:   Marnesha Gagen C [phone] [facility] Office  [phone] Pager - Text Page per Loretha Stapler as per below:  On-Call/Text Page:      [email or web address]      password [email or web address]  If 7PM-7AM, please contact night-coverage [email or web address] Password [email or web address]  01/01/2013, 11:05 AM   LOS: 9 days

## 2013-01-01 NOTE — Progress Notes (Signed)
Physical Therapy Treatment Patient Details Name: [name] [name] MRN: [id number] DOB: Sep 27, 1945 Today's Date: 01/01/2013 Time: 0865-7846 PT Time Calculation (min): 33 min  PT Assessment / Plan / Recommendation  PT Comments   Pt assisted with rolling and positioning for hygiene, and was able to get to sitting on EOB, pivot to chair, and work on sit to stand from the chair.  Making progress with mobility.  Follow Up Recommendations  SNF;Supervision/Assistance - 24 hour     Does the patient have the potential to tolerate intense rehabilitation     Barriers to Discharge        Equipment Recommendations  None recommended by PT    Recommendations for Other Services    Frequency Min 3X/week   Progress towards PT Goals Progress towards PT goals: Progressing toward goals  Plan Current plan remains appropriate    Precautions / Restrictions Precautions Precautions: Fall Precaution Comments: pt with diarrhea and excoriated skin on buttocks Restrictions Weight Bearing Restrictions: No   Pertinent Vitals/Pain No c/o pain    Mobility  Bed Mobility Bed Mobility: Rolling Right;Rolling Left;Left Sidelying to Sit Rolling Right: 4: Min assist;With rail Rolling Left: 4: Min assist;With rail Left Sidelying to Sit: With rails;3: Mod assist;HOB elevated Details for Bed Mobility Assistance: Pt needs continued cues to push hip over to roll to get hips up in high sidelying position for hygiene after diarrhea.  Pt was able to initate bringing legs toward edge of bed to get to sitting on edge of bed Transfers Transfers: Sit to Stand;Stand to Sit;Squat Pivot Transfers Sit to Stand: Without upper extremity assist;3: Mod assist;From chair/3-in-1 Stand to Sit: With upper extremity assist;3: Mod assist;To chair/3-in-1 Squat Pivot Transfers: 3: Mod assist;With upper extremity assistance Details for Transfer Assistance: Inital transfer to chair squat pivot without use of RW.  From chair, she was able  to stand x2 with mod assist Ambulation/Gait Ambulation/Gait Assistance: Not tested (comment)    Exercises Other Exercises Other Exercises: worked on AROM to assist with rolling and moving legs to allow for hygiene after diarrhea   PT Diagnosis:    PT Problem List:   PT Treatment Interventions:     PT Goals (current goals can now be found in the care plan section) Acute Rehab PT Goals Patient Stated Goal: to sit in the chair PT Goal Formulation: With patient Time For Goal Achievement: 01/15/13  Visit Information  Last PT Received On: 01/01/13    Subjective Data  Patient Stated Goal: to sit in the chair   Cognition  Cognition Arousal/Alertness: Awake/alert Behavior During Therapy: WFL for tasks assessed/performed Overall Cognitive Status: Impaired/Different from baseline Area of Impairment: Orientation Orientation Level: Time Current Attention Level: Selective Memory: Decreased short-term memory General Comments: Pt asking if telemetry box belongs to her    Balance  Balance Balance Assessed: Yes Static Sitting Balance Static Sitting - Balance Support: Bilateral upper extremity supported Static Sitting - Level of Assistance: 5: Stand by assistance Static Sitting - Comment/# of Minutes: worked on trunk extension in sitting Static Standing Balance Static Standing - Balance Support: Bilateral upper extremity supported Static Standing - Level of Assistance: 3: Mod assist  End of Session PT - End of Session Activity Tolerance: Patient tolerated treatment well Patient left: with call bell/phone within reach;in chair (sidelying on Right side to  relieve pressure off bottom) Nurse Communication: Other (comment) (air overlay mattress)   GP     Donnetta Hail 01/01/2013, 2:50 PM

## 2013-01-01 NOTE — Progress Notes (Signed)
Chaplain Note: Accompanied MKM to GOC meeting with pt's husband, son and daughter. Listened empathically as they shared the pt's story and their story.  Left GOC meeting early when I got paged to an emergency.  Checked in with pt's daughter later.  She expressed gratitude for my presence during the GOC meeting and for listening.  Will follow up on Monday.  Rutherford Nail Chaplain [phone]

## 2013-01-02 LAB — BASIC METABOLIC PANEL
BUN: 51 mg/dL — ABNORMAL HIGH (ref 6–23)
CO2: 23 mEq/L (ref 19–32)
GFR calc non Af Amer: 13 mL/min — ABNORMAL LOW (ref >90)
Glucose, Bld: 87 mg/dL (ref 70–99)
Potassium: 3.9 mEq/L (ref 3.5–5.1)

## 2013-01-02 LAB — CBC
Hemoglobin: 8.5 g/dL — ABNORMAL LOW (ref 12.0–15.0)
MCH: 32.1 pg (ref 26.0–34.0)
MCHC: 32.6 g/dL (ref 30.0–36.0)
MCV: 98.5 fL (ref 78.0–100.0)

## 2013-01-02 LAB — GLUCOSE, CAPILLARY: Glucose-Capillary: 72 mg/dL (ref 70–99)

## 2013-01-02 NOTE — Progress Notes (Addendum)
[facility] Progress Note    NETHA DAFOE [id number] DOB: 07-12-1945 DOA: 12/27/12 PCP: Judie Petit, MD  Brief narrative: 67 y/o female with alcoholic cirrhosis admitted 6/18 through the [facility] ED for septic shock in the setting of a recent diagnosis of a "bloodstream infection".  She was admitted to [facility] with septic shock and respiratory failure from a small bowel perforation from 5/7 - 5/29. 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 She was discharged to [facility] for wound care, physical therapy, and speech therapy.  For the two weeks prior to this admission she experienced some difficulty sleeping, mild confusion, and worsening jaundice. The family was unaware of whether or not she had a fever, but apparently blood cultures were collected on 6/16 and were positive for GPC's in pairs and clusters. She was also believed to have a UTI. She was started on Septra for a UTI on 6/14 and this was stopped on 6/17. A PICC was placed on 6/17 and cipro IV was started. She went to her surgeons office for a wound check on 6/18 and had a normal blood pressure. After she returned to [facility] she was told to go to the ED because she had "low calcium that needed to be treated in the hospital". In the [facility] ED she was found to be notably hypotensive.   SIGNIFICANT EVENTS:  6/19 Add solu cortef, placed wound vac  6/20 Albumin given  6/21 Albumin given, added midodrine  6/23 Transfer to SDU  Assessment/Plan:  Septic shock resolved 6/22 per PCCM - no clear source identified per PCCM notes (UTI vs/ bacteremia vs/ colits vs/ SBP) - positive blood culture from the SNF (coag neg staph) - urine cx negative x2 but UA was grossly abnormal at admit - no qualifying signs/symptoms for sepsis at this time - continue to monitor clinically   Coagulase negative bacteremia  Blood cx repeated per ID 6/24 - per  ID "consider getting echo if she has recurrent bacteremia due to concern for endocarditis" - vanc was d/c 6/24 per PCCM -6/24 blood cultures neg to date, will follow Small pleural effusions >> in setting of cirrhosis, ascites, and low albumin state resp status remains stable, follow  Hx of HTN Pt is relatively hypotensive at this time   Acute renal failure likely from septic shock and ACE inhibitor use and ?hepatorenal >> baseline creatinine 0.79 from 11/26/12 - cr trending up 6/25 -renal US neg  -cr was gradually trending down but upward trend today, holding off ivf for now given her cirrhosis with hypoalbuminemia>> continue to trend and further manage accordingly, Noted per palliative goals pt does not want iv  Hyponatremia, mild likely due to cirrhosis >> improved  Hypokalemia, mild Resolved  ETOH cirrhosis with hypoalbuminemia Makes euvolemic state difficult to achieve - continue to follow BP and cr closely  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 No evidence of spontaneous bleeding at this time  Mild hepatic encephalopathy improving - last ammonia ileval 6/26- 29  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  -Per palliative goals meeting 6/27 - Pt DNR 1. Respiratory/Oxygen: as indicated 2. Nutritional Support/Tube Feeds: No 3. Antibiotics: Yes for identified infections 4. Blood Products: if required during this hospitalization only 5. IVF: No 6. Review of Medications to be discontinued: none 7. Labs: as indicated during this  hospitalization  8. Telemetry: as indicated during this hospitalizations 9. Consults: spiritual   Code Status: DNR Family Communication: spoke w/ pt and husband at bedside Disposition Plan: pending clinical course/[facility] eligibility eval  Consultants: PCCM Gen Surgery ID  Procedures: 6/17 R arm PICC >>  6/18 Lt IJ CVL >> 6/23  Antibiotics: 6/14 bactrim >> 6/18 ([facility])  6/17 Cipro >> 6/18 [facility])  6/18 imipenem >> 6/23  6/18 vanc >> 6/24 6/23 Ceftriaxone >>   DVT prophylaxis: SCDs + auto-anticoag  HPI/Subjective: The patient is alert and conversant.  Denies any new c/o, friends at bedside  Objective: Blood pressure 84/49, pulse 95, temperature 97.4 F (36.3 C), temperature source Oral, resp. rate 18, height 5\' 5"  (1.651 m), weight 97.5 kg (214 lb 15.2 oz), SpO2 98.00%.  Intake/Output Summary (Last 24 hours) at 01/02/13 1411 Last data filed at 01/02/13 0535  Gross per 24 hour  Intake    680 ml  Output   1000 ml  Net   -320 ml   Exam: General: No acute respiratory distress at rest  Lungs: Distant breath sounds throughout all fields with poor air movement in bilateral bases with no wheeze Cardiovascular: Distant heart sounds - 2/6 holosystolic murmur - regular rate and rhythm Abdomen: Morbidly obese, midline vacuum dressing intact, nontender, soft, bowel sounds positive, no rebound, no appreciable mass Extremities: No significant cyanosis, or clubbing;  +2-3 edema L>R extremities  Data Reviewed: Basic Metabolic Panel:  Recent Labs Lab 12/29/12 0500 12/30/12 0455 12/31/12 0455 01/01/13 0525 01/02/13 0510  NA 135 136 135 137 138  K 3.9 3.5 3.4* 3.8 3.9  CL 102 102 103 106 106  CO2 24 23 22 24 23   GLUCOSE 108* 89 113* 117* 87  BUN 36* 42* 45* 48* 51*  CREATININE 3.04* 3.26* 3.19* 3.18* 3.46*  CALCIUM 8.0* 7.9* 7.7* 7.7* 7.8*  PHOS  --  4.3  --   --   --    Liver Function Tests:  Recent Labs Lab 12/27/12 0400 12/29/12 0500  12/30/12 0455 12/31/12 0455  AST 85* 102*  --  102*  ALT 41* 53*  --  53*  ALKPHOS 111 124*  --  130*  BILITOT 14.7* 13.3*  --  14.7*  PROT 5.0* 5.0*  --  4.7*  ALBUMIN 1.5* 1.4* 1.4* 1.2*   No results found for this basename: LIPASE, AMYLASE,  in the last 168 hours  Recent Labs Lab 12/31/12 0455  AMMONIA 29   CBC:  Recent Labs Lab 12/29/12 0500 12/30/12 0455 12/31/12 0455 01/01/13 0525 01/02/13 0510  WBC 14.2* 18.3* 15.6* 15.4* 14.5*  HGB 8.9* 9.5* 9.5* 9.2* 8.5*  HCT 26.1* 28.4* 29.0* 27.7* 26.1*  MCV 92.6 93.1 96.3 97.9 98.5  PLT 88* 101* 86* 87* 87*   Cardiac Enzymes: No results found for this basename: CKTOTAL, CKMB, CKMBINDEX, TROPONINI,  in the last 168 hours CBG:  Recent Labs Lab 01/01/13 1214 01/01/13 1710 01/01/13 2100 01/02/13 0823 01/02/13 1244  GLUCAP 102* 147* 135* 72 103*    Recent Results (from the past 240 hour(s))  URINE CULTURE     Status: None  Collection Time    01/28/2013  8:32 PM      Result Value Range Status   Specimen Description URINE, CATHETERIZED   Final   Special Requests NONE   Final   Culture  Setup Time 01/25/2013 22:41   Final   Colony Count NO GROWTH   Final   Culture NO GROWTH   Final   Report Status 12/25/2012 FINAL   Final  MRSA PCR SCREENING     Status: None   Collection Time    Jan 05, 2013  9:55 PM      Result Value Range Status   MRSA by PCR NEGATIVE  NEGATIVE Final   Comment:            The GeneXpert MRSA Assay (FDA     approved for NASAL specimens     only), is one component of a     comprehensive MRSA colonization     surveillance program. It is not     intended to diagnose MRSA     infection nor to guide or     monitor treatment for     MRSA infections.  URINE CULTURE     Status: None   Collection Time    12/25/12  1:07 PM      Result Value Range Status   Specimen Description URINE, CATHETERIZED   Final   Special Requests NONE   Final   Culture  Setup Time 12/25/2012 15:07   Final   Colony Count NO  GROWTH   Final   Culture NO GROWTH   Final   Report Status 12/26/2012 FINAL   Final  CULTURE, BLOOD (ROUTINE X 2)     Status: None   Collection Time    12/29/12  4:43 PM      Result Value Range Status   Specimen Description BLOOD LEFT HAND   Final   Special Requests BOTTLES DRAWN AEROBIC ONLY 3CC   Final   Culture  Setup Time 12/30/2012 04:07   Final   Culture     Final   Value:        BLOOD CULTURE RECEIVED NO GROWTH TO DATE CULTURE WILL BE HELD FOR 5 DAYS BEFORE ISSUING A FINAL NEGATIVE REPORT   Report Status PENDING   Incomplete  CULTURE, BLOOD (ROUTINE X 2)     Status: None   Collection Time    12/29/12  4:43 PM      Result Value Range Status   Specimen Description BLOOD LEFT ARM   Final   Special Requests BOTTLES DRAWN AEROBIC ONLY 1.5CC   Final   Culture  Setup Time 12/30/2012 04:07   Final   Culture     Final   Value:        BLOOD CULTURE RECEIVED NO GROWTH TO DATE CULTURE WILL BE HELD FOR 5 DAYS BEFORE ISSUING A FINAL NEGATIVE REPORT   Report Status PENDING   Incomplete     Studies:   I have reviewed all recent imaging studies in detail   Scheduled Meds:  Scheduled Meds: . cefTRIAXone (ROCEPHIN)  IV  1 g Intravenous Q24H  . feeding supplement  237 mL Oral BID BM  . folic acid  1 mg Oral Daily  . lactulose  10 g Oral Daily  . levothyroxine  25 mcg Oral QAC breakfast  . midodrine  5 mg Oral TID WC  . multivitamin with minerals  1 tablet Oral Daily  . pantoprazole  40 mg Oral Q1200  . thiamine  100 mg Oral Daily    Time spent on care of this patient:   Kerilyn Cortner C [phone] [facility] Office  [phone] Pager - Text Page per Loretha Stapler as per below:  On-Call/Text Page:      [email or web address]      password [email or web address]  If 7PM-7AM, please contact night-coverage [email or web address] Password [email or web address] 01/02/2013, 2:11 PM   LOS: 10 days

## 2013-01-02 NOTE — Consult Note (Signed)
[facility] Liaison: Received voice message referral from CSW Erie Noe 01/01/2013 following Palliative Medicine Team consult. Chart reviewed, [facility] MD to assess for eligibility. Currently no [employer] availability. Will follow up with CSW or family Monday or sooner if eligible and room available. Thank you. Forrestine Him LCSW [phone]

## 2013-01-03 LAB — BASIC METABOLIC PANEL
CO2: 22 mEq/L (ref 19–32)
Chloride: 107 mEq/L (ref 96–112)
Glucose, Bld: 99 mg/dL (ref 70–99)
Potassium: 3.8 mEq/L (ref 3.5–5.1)
Sodium: 137 mEq/L (ref 135–145)

## 2013-01-03 LAB — GLUCOSE, CAPILLARY
Glucose-Capillary: 83 mg/dL (ref 70–99)
Glucose-Capillary: 74 mg/dL (ref 70–99)
Glucose-Capillary: 83 mg/dL (ref 70–99)

## 2013-01-03 LAB — AMMONIA: Ammonia: 36 umol/L (ref 11–60)

## 2013-01-03 NOTE — Progress Notes (Signed)
[facility] Progress Note    [name] [name] ZOX:[id number] DOB: [birth year] DOA: 12/22/2012 PCP: Judie Petit, MD  Brief narrative: 67 y/o female with alcoholic cirrhosis admitted 6/18 through the [facility] ED for septic shock in the setting of a recent diagnosis of a "bloodstream infection".  She was admitted to [facility] with septic shock and respiratory failure from a small bowel perforation from 5/7 - 5/29. 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 She was discharged to [facility] for wound care, physical therapy, and speech therapy.  For the two weeks prior to this admission she experienced some difficulty sleeping, mild confusion, and worsening jaundice. The family was unaware of whether or not she had a fever, but apparently blood cultures were collected on 6/16 and were positive for GPC's in pairs and clusters. She was also believed to have a UTI. She was started on Septra for a UTI on 6/14 and this was stopped on 6/17. A PICC was placed on 6/17 and cipro IV was started. She went to her surgeons office for a wound check on 6/18 and had a normal blood pressure. After she returned to [facility] she was told to go to the ED because she had "low calcium that needed to be treated in the hospital". In the [facility] ED she was found to be notably hypotensive.   SIGNIFICANT EVENTS:  6/19 Add solu cortef, placed wound vac  6/20 Albumin given  6/21 Albumin given, added midodrine  6/23 Transfer to SDU  Assessment/Plan:  Septic shock resolved 6/22 per PCCM - no clear source identified per PCCM notes (UTI vs/ bacteremia vs/ colits vs/ SBP) - positive blood culture from the SNF (coag neg staph) - urine cx negative x2 but UA was grossly abnormal at admit - no qualifying signs/symptoms for sepsis at this time - continue to monitor clinically   Coagulase negative bacteremia  Blood cx repeated per ID 6/24 - per  ID "consider getting echo if she has recurrent bacteremia due to concern for endocarditis" - vanc was d/c 6/24 per PCCM -6/24 blood cultures neg to date, will follow Small pleural effusions >> in setting of cirrhosis, ascites, and low albumin state resp status remains stable, follow  Hx of HTN Pt is relatively hypotensive at this time   Acute renal failure/?Hepatorenal syndrome likely from septic shock and ACE inhibitor use and ?hepatorenal >> baseline creatinine 0.79 from 11/26/12 - cr trending up 6/25 -renal US neg  -cr was gradually trending down but with furhter BUM, holding off ivf for now given her cirrhosis with hypoalbuminemia>> continue to trend and further manage accordingly, Noted per palliative goals OF 6/27 pt does not want iv  Hyponatremia, mild likely due to cirrhosis >> improved  Hypokalemia, mild Resolved  ETOH cirrhosis with hypoalbuminemia Makes euvolemic state difficult to achieve - continue to follow BP and cr closely  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 No evidence of spontaneous bleeding at this time  hepatic encephalopathy/ AMS Had improved - last ammonia ileval 6/26- 29 -Pt with worsening of mental status today, I have updated daughter at bedside and left messages for husband>>pt deteriorating>>poor prognosis -rising BUN -worsening rena function possibly contributing to AMS, will obtain ammonia level and follow  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  -Per palliative goals meeting 6/27 - Pt  DNR 1. Respiratory/Oxygen: as indicated 2. Nutritional Support/Tube Feeds: No 3. Antibiotics: Yes for identified infections 4. Blood Products: if required during this hospitalization only 5. IVF: No 6. Review of Medications to be discontinued: none 7. Labs: as indicated during this hospitalization  8. Telemetry: as indicated during this hospitalizations 9. Consults: spiritual   Code Status: DNR Family Communication: spoke w/ daughter at bedside Disposition Plan: pending clinical course  Consultants: PCCM Gen Surgery ID PALLIATIVE CARE  Procedures: 6/17 R arm PICC >>  6/18 Lt IJ CVL >> 6/23  Antibiotics: 6/14 bactrim >> 6/18 [facility])  6/17 Cipro >> 6/18 [facility])  6/18 imipenem >> 6/23  6/18 vanc >> 6/24 6/23 Ceftriaxone >>   DVT prophylaxis: SCDs + auto-anticoag  HPI/Subjective: The patient is more somnolent today but easily aroused, oriented only to person. Denies pain, daughter at bedside.  Objective: Blood pressure 99/41, pulse 88, temperature 97.9 F (36.6 C), temperature source Oral, resp. rate 18, height 5\' 5"  (1.651 m), weight 95.255 kg (210 lb), SpO2 93.00%.  Intake/Output Summary (Last 24 hours) at 01/03/13 1240 Last data filed at 01/03/13 0600  Gross per 24 hour  Intake    660 ml  Output    800 ml  Net   -140 ml   Exam: General: No acute respiratory distress at rest, very jaundiced  Lungs: Distant breath sounds throughout all fields with poor air movement in bilateral bases with no wheeze Cardiovascular: Distant heart sounds - 2/6 holosystolic murmur - regular rate and rhythm Abdomen: Morbidly obese, midline vacuum dressing intact, nontender, soft, bowel sounds positive, no rebound, no appreciable mass Extremities: No significant cyanosis, or clubbing;  +2-3 edema L>R extremities  Data Reviewed: Basic Metabolic Panel:  Recent Labs Lab 12/30/12 0455 12/31/12 0455 01/01/13 0525 01/02/13 0510 01/03/13 0550  NA 136 135 137 138 137   K 3.5 3.4* 3.8 3.9 3.8  CL 102 103 106 106 107  CO2 23 22 24 23 22   GLUCOSE 89 113* 117* 87 99  BUN 42* 45* 48* 51* 53*  CREATININE 3.26* 3.19* 3.18* 3.46* 3.84*  CALCIUM 7.9* 7.7* 7.7* 7.8* 7.7*  PHOS 4.3  --   --   --   --    Liver Function Tests:  Recent Labs Lab 12/29/12 0500 12/30/12 0455 12/31/12 0455  AST 102*  --  102*  ALT 53*  --  53*  ALKPHOS 124*  --  130*  BILITOT 13.3*  --  14.7*  PROT 5.0*  --  4.7*  ALBUMIN 1.4* 1.4* 1.2*   No results found for this basename: LIPASE, AMYLASE,  in the last 168 hours  Recent Labs Lab 12/31/12 0455  AMMONIA 29   CBC:  Recent Labs Lab 12/29/12 0500 12/30/12 0455 12/31/12 0455 01/01/13 0525 01/02/13 0510  WBC 14.2* 18.3* 15.6* 15.4* 14.5*  HGB 8.9* 9.5* 9.5* 9.2* 8.5*  HCT 26.1* 28.4* 29.0* 27.7* 26.1*  MCV 92.6 93.1 96.3 97.9 98.5  PLT 88* 101* 86* 87* 87*   Cardiac Enzymes: No results found for this basename: CKTOTAL, CKMB, CKMBINDEX, TROPONINI,  in the last 168 hours CBG:  Recent Labs Lab 01/02/13 1244  01/02/13 1722 01/02/13 2249 01/03/13 0758 01/03/13 1125  GLUCAP 103* 112* 89 89 83    Recent Results (from the past 240 hour(s))  URINE CULTURE     Status: None   Collection Time    12/25/12  1:07 PM      Result Value Range Status   Specimen Description URINE, CATHETERIZED   Final   Special Requests NONE   Final   Culture  Setup Time 12/25/2012 15:07   Final   Colony Count NO GROWTH   Final   Culture NO GROWTH   Final   Report Status 12/26/2012 FINAL   Final  CULTURE, BLOOD (ROUTINE X 2)     Status: None   Collection Time    12/29/12  4:43 PM      Result Value Range Status   Specimen Description BLOOD LEFT HAND   Final   Special Requests BOTTLES DRAWN AEROBIC ONLY 3CC   Final   Culture  Setup Time 12/30/2012 04:07   Final   Culture     Final   Value:        BLOOD CULTURE RECEIVED NO GROWTH TO DATE CULTURE WILL BE HELD FOR 5 DAYS BEFORE ISSUING A FINAL NEGATIVE REPORT   Report Status PENDING    Incomplete  CULTURE, BLOOD (ROUTINE X 2)     Status: None   Collection Time    12/29/12  4:43 PM      Result Value Range Status   Specimen Description BLOOD LEFT ARM   Final   Special Requests BOTTLES DRAWN AEROBIC ONLY 1.5CC   Final   Culture  Setup Time 12/30/2012 04:07   Final   Culture     Final   Value:        BLOOD CULTURE RECEIVED NO GROWTH TO DATE CULTURE WILL BE HELD FOR 5 DAYS BEFORE ISSUING A FINAL NEGATIVE REPORT   Report Status PENDING   Incomplete     Studies:   I have reviewed all recent imaging studies in detail   Scheduled Meds:  Scheduled Meds: . cefTRIAXone (ROCEPHIN)  IV  1 g Intravenous Q24H  . feeding supplement  237 mL Oral BID BM  . folic acid  1 mg Oral Daily  . lactulose  10 g Oral Daily  . levothyroxine  25 mcg Oral QAC breakfast  . midodrine  5 mg Oral TID WC  . multivitamin with minerals  1 tablet Oral Daily  . pantoprazole  40 mg Oral Q1200  . thiamine  100 mg Oral Daily    Time spent on care of this patient:   Jamille Fisher C [phone] [facility] Office  [phone] Pager - Text Page per Loretha Stapler as per below:  On-Call/Text Page:      [email or web address]      password [email or web address]  If 7PM-7AM, please contact night-coverage [email or web address] Password [email or web address] 01/03/2013, 12:40 PM   LOS: 11 days

## 2013-01-04 DIAGNOSIS — F411 Generalized anxiety disorder: Secondary | ICD-10-CM

## 2013-01-04 DIAGNOSIS — Z515 Encounter for palliative care: Secondary | ICD-10-CM

## 2013-01-04 DIAGNOSIS — E162 Hypoglycemia, unspecified: Secondary | ICD-10-CM

## 2013-01-04 DIAGNOSIS — R1084 Generalized abdominal pain: Secondary | ICD-10-CM

## 2013-01-04 DIAGNOSIS — R0609 Other forms of dyspnea: Secondary | ICD-10-CM

## 2013-01-04 LAB — GLUCOSE, CAPILLARY
Glucose-Capillary: 118 mg/dL — ABNORMAL HIGH (ref 70–99)
Glucose-Capillary: 62 mg/dL — ABNORMAL LOW (ref 70–99)
Glucose-Capillary: 87 mg/dL (ref 70–99)

## 2013-01-04 LAB — CLOSTRIDIUM DIFFICILE BY PCR: Toxigenic C. Difficile by PCR: NEGATIVE

## 2013-01-04 MED ORDER — DEXTROSE 50 % IV SOLN
INTRAVENOUS | Status: AC
  Administered 2013-01-04: 08:00:00
  Filled 2013-01-04: qty 50

## 2013-01-04 MED ORDER — ATROPINE SULFATE 1 % OP SOLN
2.0000 [drp] | OPHTHALMIC | Status: DC | PRN
  Filled 2013-01-04: qty 2

## 2013-01-04 MED ORDER — MORPHINE SULFATE (CONCENTRATE) 10 MG /0.5 ML PO SOLN
2.5000 mg | ORAL | Status: DC | PRN
  Administered 2013-01-04: 5 mg via ORAL
  Filled 2013-01-04: qty 0.5

## 2013-01-04 MED ORDER — DEXTROSE 50 % IV SOLN
INTRAVENOUS | Status: AC
  Administered 2013-01-04: 50 mL
  Filled 2013-01-04: qty 50

## 2013-01-04 MED ORDER — HYDROMORPHONE HCL PF 1 MG/ML IJ SOLN
1.0000 mg | INTRAMUSCULAR | Status: DC | PRN
  Administered 2013-01-04 – 2013-01-05 (×3): 1 mg via INTRAVENOUS
  Filled 2013-01-04 (×3): qty 1

## 2013-01-04 MED ORDER — LORAZEPAM 2 MG/ML IJ SOLN
0.5000 mg | INTRAMUSCULAR | Status: DC | PRN
  Administered 2013-01-04: 0.5 mg via INTRAVENOUS
  Filled 2013-01-04: qty 1

## 2013-01-04 NOTE — Progress Notes (Signed)
NUTRITION FOLLOW UP  DOCUMENTATION CODES  Per approved criteria   -Severe malnutrition in the context of chronic illness    Intervention:   Encourage oral nutrition supplements as able (currently ordered for Ensure Complete.) No artificial nutrition/TF per GOC note by Palliative Team - 6/27. RD to continue to follow nutrition care plan.  Nutrition Dx:   Inadequate oral intake related to poor appetite as evidenced by pt report, dietary recall and ongoing weight loss. Ongoing.  Goal:   Maximize oral intake as able within goals of care.  Monitor:   weight trends, lab trends, I/O's, PO intake, supplement tolerance  Assessment:   Admitted with alcoholic cirrhosis. From [facility]. Note pt was admitted to [facility] 5/7 - 5/29 for small bowel perforation.   Remains jaundiced. Edematous lower extremities 3-4+. Wound VAC remains on abdomen.  Question accuracy of weights and total fluid status - weight on admit was 151 lb - however now weights are between 210 - 219 lb.   Eating 25% of Carbohydrate Modified Medium meals. Oral intake has declined. Noted pt with poor prognosis. GOC addressed by palliative medicine team on 6/27 - noted pt does not want nutritional support or tube feedings. Likely home with hospice vs residential hospice.  Pt meets criteria for severe MALNUTRITION in the context of chronic illness as evidenced by 28% wt loss x 1 month and intake of <75% x at least 1 month.   Height: Ht Readings from Last 1 Encounters:  12/31/12 5\' 5"  (1.651 m)    Weight Status:   Wt Readings from Last 1 Encounters:  01/03/13 210 lb 15.7 oz (95.7 kg)    Re-estimated needs:  Kcal: 1975 - 2100 Protein: at least 90 g Fluid: per team  Skin:  Nonhealing midline wound; VAC placed 6/19  Diet Order: Carb Control Medium (1600 - 2000)   Intake/Output Summary (Last 24 hours) at 01/04/13 1023 Last data filed at 01/04/13 0300  Gross per 24 hour  Intake    770 ml  Output    500 ml   Net    270 ml    Last BM: 6/29   Labs:   Recent Labs Lab 12/30/12 0455  01/01/13 0525 01/02/13 0510 01/03/13 0550  NA 136  < > 137 138 137  K 3.5  < > 3.8 3.9 3.8  CL 102  < > 106 106 107  CO2 23  < > 24 23 22   BUN 42*  < > 48* 51* 53*  CREATININE 3.26*  < > 3.18* 3.46* 3.84*  CALCIUM 7.9*  < > 7.7* 7.8* 7.7*  PHOS 4.3  --   --   --   --   GLUCOSE 89  < > 117* 87 99  < > = values in this interval not displayed.  CBG (last 3)   Recent Labs  01/04/13 0825 01/04/13 0839 01/04/13 0949  GLUCAP 64* 107* 62*    Scheduled Meds: . cefTRIAXone (ROCEPHIN)  IV  1 g Intravenous Q24H  . feeding supplement  237 mL Oral BID BM  . folic acid  1 mg Oral Daily  . lactulose  10 g Oral Daily  . levothyroxine  25 mcg Oral QAC breakfast  . midodrine  5 mg Oral TID WC  . multivitamin with minerals  1 tablet Oral Daily  . pantoprazole  40 mg Oral Q1200  . thiamine  100 mg Oral Daily    Continuous Infusions:  none  Jarold Motto MS, RD, LDN Pager:  [phone] After-hours pager: [phone]

## 2013-01-04 NOTE — Progress Notes (Signed)
[facility] Progress Note    [name] [name]:[id number] DOB: [birth year] DOA: January 01, 2013 PCP: Judie Petit, MD  Brief narrative: 67 y/o female with alcoholic cirrhosis admitted 6/18 through the [facility] ED for septic shock in the setting of a recent diagnosis of a "bloodstream infection".  She was admitted to [facility] with septic shock and respiratory failure from a small bowel perforation from 5/7 - 5/29. 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 She was discharged to [facility] for wound care, physical therapy, and speech therapy.  For the two weeks prior to this admission she experienced some difficulty sleeping, mild confusion, and worsening jaundice. The family was unaware of whether or not she had a fever, but apparently blood cultures were collected on 6/16 and were positive for GPC's in pairs and clusters. She was also believed to have a UTI. She was started on Septra for a UTI on 6/14 and this was stopped on 6/17. A PICC was placed on 6/17 and cipro IV was started. She went to her surgeons office for a wound check on 6/18 and had a normal blood pressure. After she returned to [facility] she was told to go to the ED because she had "low calcium that needed to be treated in the hospital". In the [facility] ED she was found to be notably hypotensive.   SIGNIFICANT EVENTS:  6/19 Add solu cortef, placed wound vac  6/20 Albumin given  6/21 Albumin given, added midodrine  6/23 Transfer to SDU  Assessment/Plan:  Septic shock resolved 6/22 per PCCM - no clear source identified per PCCM notes (UTI vs/ bacteremia vs/ colits vs/ SBP) - positive blood culture from the SNF (coag neg staph) - urine cx negative x2 but UA was grossly abnormal at admit - no qualifying signs/symptoms for sepsis at this time - continue to monitor clinically   Coagulase negative bacteremia  Blood cx repeated per ID 6/24 - per  ID "consider getting echo if she has recurrent bacteremia due to concern for endocarditis" - vanc was d/c 6/24 per PCCM -6/24 blood cultures neg to date, will follow Small pleural effusions >> in setting of cirrhosis, ascites, and low albumin state resp status remains stable, follow  Hx of HTN Pt is relatively hypotensive at this time   Acute renal failure/?Hepatorenal syndrome likely from septic shock and ACE inhibitor use and ?hepatorenal >> baseline creatinine 0.79 from 11/26/12 - cr trending up 6/25 -renal US neg  -cr was gradually trending down but with furhter BUM, holding off ivf for now given her cirrhosis with hypoalbuminemia>> continue to trend and further manage accordingly, Noted per palliative goals OF 6/27 pt does not want iv  Hyponatremia, mild likely due to cirrhosis >> improved  Hypokalemia, mild Resolved  ETOH cirrhosis with hypoalbuminemia Makes euvolemic state difficult to achieve - continue to follow BP and cr closely  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 No evidence of spontaneous bleeding at this time  hepatic encephalopathy/ AMS Had improved - last ammonia ileval 6/29- 36 -Pt with worsening of mental status today, I  updated daughter 6/29 at bedside and left messages for husband>>pt deteriorating>>poor prognosis -rising BUN -worsening rena function possibly contributing to AMS, ammonia level wnl -continues to decline clinicallyless responsive today, updated daughter at bedside, per [facility] SW bed will be available in am  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   Anemia of critical illness and chronic disease Hgb 8.5 today- follow t   DM type II, with  hypoglycemia - hypoglycemia secondary to cirrhosis and poor po intake -D50 for now, palliative to further further clarify goals- ?continue cbg?d5   Dyspepsia -continue PPI   husband was at bedside on 6/26 and voiced concern about her multiiple hospitalizations and overall decline, so palliative care for goals was discussed and he agreed to this as well as does pt.  -Per palliative goals meeting 6/27 - Pt DNR 1. Respiratory/Oxygen: as indicated 2. Nutritional Support/Tube Feeds: No 3. Antibiotics: Yes for identified infections 4. Blood Products: if required during this hospitalization only 5. IVF: No 6. Review of Medications to be discontinued: none 7. Labs: as indicated during this hospitalization  8. Telemetry: as indicated during this hospitalizations 9. Consults: spiritual   Code Status: DNR Family Communication: spoke w/ daughter at bedside Disposition Plan: pending clinical course  Consultants: PCCM Gen Surgery ID PALLIATIVE CARE  Procedures: 6/17 R arm PICC >>  6/18 Lt IJ CVL >> 6/23  Antibiotics: 6/14 bactrim >> 6/18 [facility])  6/17 Cipro >> 6/18 [facility])  6/18 imipenem >> 6/23  6/18 vanc >> 6/24 6/23 Ceftriaxone >>   DVT prophylaxis: SCDs + auto-anticoag  HPI/Subjective: The patient less responsive today, grunting intermittently  Objective: Blood pressure 125/93, pulse 154, temperature 98.4 F (36.9 C), temperature source Oral, resp. rate 18, height 5\' 5"  (1.651 m), weight 95.7 kg (210 lb 15.7 oz), SpO2 100.00%.  Intake/Output Summary (Last 24 hours) at 01/04/13 1232 Last data filed at 01/04/13 0300  Gross per 24 hour  Intake    720 ml  Output    500 ml  Net    220 ml   Exam: General: No acute respiratory distress at rest, very jaundiced  Lungs: Distant breath sounds throughout all fields with poor air movement in bilateral bases with no wheeze Cardiovascular: Distant heart sounds - 2/6 holosystolic murmur - regular rate and  rhythm Abdomen: Morbidly obese, midline vacuum dressing intact, nontender, soft, bowel sounds positive, no rebound, no appreciable mass Extremities: No significant cyanosis, or clubbing;  +2-3 edema L>R extremities  Data Reviewed: Basic Metabolic Panel:  Recent Labs Lab 12/30/12 0455 12/31/12 0455 01/01/13 0525 01/02/13 0510 01/03/13 0550  NA 136 135 137 138 137  K 3.5 3.4* 3.8 3.9 3.8  CL 102 103 106 106 107  CO2 23 22 24 23 22   GLUCOSE 89 113* 117* 87 99  BUN 42* 45* 48* 51* 53*  CREATININE 3.26* 3.19* 3.18* 3.46* 3.84*  CALCIUM 7.9* 7.7* 7.7* 7.8* 7.7*  PHOS 4.3  --   --   --   --    Liver Function Tests:  Recent Labs Lab 12/29/12 0500 12/30/12 0455 12/31/12 0455  AST 102*  --  102*  ALT 53*  --  53*  ALKPHOS 124*  --  130*  BILITOT 13.3*  --  14.7*  PROT 5.0*  --  4.7*  ALBUMIN 1.4* 1.4* 1.2*   No results found for this basename: LIPASE, AMYLASE,  in the last 168 hours  Recent Labs Lab 12/31/12 0455 01/03/13 1430  AMMONIA 29 36   CBC:  Recent Labs Lab 12/29/12 0500 12/30/12 0455 12/31/12 0455 01/01/13  0525 01/02/13 0510  WBC 14.2* 18.3* 15.6* 15.4* 14.5*  HGB 8.9* 9.5* 9.5* 9.2* 8.5*  HCT 26.1* 28.4* 29.0* 27.7* 26.1*  MCV 92.6 93.1 96.3 97.9 98.5  PLT 88* 101* 86* 87* 87*   Cardiac Enzymes: No results found for this basename: CKTOTAL, CKMB, CKMBINDEX, TROPONINI,  in the last 168 hours CBG:  Recent Labs Lab 01/04/13 0825 01/04/13 0839 01/04/13 0949 01/04/13 1029 01/04/13 1147  GLUCAP 64* 107* 62* 118* 87    Recent Results (from the past 240 hour(s))  URINE CULTURE     Status: None   Collection Time    12/25/12  1:07 PM      Result Value Range Status   Specimen Description URINE, CATHETERIZED   Final   Special Requests NONE   Final   Culture  Setup Time 12/25/2012 15:07   Final   Colony Count NO GROWTH   Final   Culture NO GROWTH   Final   Report Status 12/26/2012 FINAL   Final  CULTURE, BLOOD (ROUTINE X 2)     Status: None    Collection Time    12/29/12  4:43 PM      Result Value Range Status   Specimen Description BLOOD LEFT HAND   Final   Special Requests BOTTLES DRAWN AEROBIC ONLY 3CC   Final   Culture  Setup Time 12/30/2012 04:07   Final   Culture     Final   Value:        BLOOD CULTURE RECEIVED NO GROWTH TO DATE CULTURE WILL BE HELD FOR 5 DAYS BEFORE ISSUING A FINAL NEGATIVE REPORT   Report Status PENDING   Incomplete  CULTURE, BLOOD (ROUTINE X 2)     Status: None   Collection Time    12/29/12  4:43 PM      Result Value Range Status   Specimen Description BLOOD LEFT ARM   Final   Special Requests BOTTLES DRAWN AEROBIC ONLY 1.5CC   Final   Culture  Setup Time 12/30/2012 04:07   Final   Culture     Final   Value:        BLOOD CULTURE RECEIVED NO GROWTH TO DATE CULTURE WILL BE HELD FOR 5 DAYS BEFORE ISSUING A FINAL NEGATIVE REPORT   Report Status PENDING   Incomplete  CLOSTRIDIUM DIFFICILE BY PCR     Status: None   Collection Time    01/04/13  7:09 AM      Result Value Range Status   C difficile by pcr NEGATIVE  NEGATIVE Final     Studies:   I have reviewed all recent imaging studies in detail   Scheduled Meds:  Scheduled Meds: . cefTRIAXone (ROCEPHIN)  IV  1 g Intravenous Q24H  . feeding supplement  237 mL Oral BID BM  . folic acid  1 mg Oral Daily  . lactulose  10 g Oral Daily  . levothyroxine  25 mcg Oral QAC breakfast  . midodrine  5 mg Oral TID WC  . multivitamin with minerals  1 tablet Oral Daily  . pantoprazole  40 mg Oral Q1200  . thiamine  100 mg Oral Daily    Time spent on care of this patient:   Willistine Ferrall C [phone] [facility] Office  [phone] Pager - Text Page per Loretha Stapler as per below:  On-Call/Text Page:      [email or web address]      password [email or web address]  If 7PM-7AM, please contact night-coverage [email or web address] Password [email or web address] 01/04/2013, 12:32 PM  LOS: 12 days

## 2013-01-04 NOTE — Consult Note (Signed)
WOC re-consult: Pt now with comfort care goals.  Requested to remove abd vac dressing and apply topical care that is consistent with comfort care foals.  Abd wound unchanged in size and appearance from previous WOC note.  Applied Xeroform to decrease discomfort with dressing changes and avoid adherence of dressing to woundbed.  Abd pad to absorb drainage.  Pt plans to transfer to [facility]. Please re-consult if further assistance is needed.  Thank-you,  Cammie Mcgee MSN, RN, CWOCN, [place], CNS [phone]

## 2013-01-04 NOTE — Progress Notes (Signed)
Physical Therapy Discharge Patient Details Name: [name] [name] MRN: [id number] DOB: 08/06/1945 Today's Date: 01/04/2013 Time:  -     Patient discharged from PT services secondary to medical decline - plans to d/c to [facility].  Please see latest therapy progress note for current level of functioning and progress toward goals.    Progress and discharge plan discussed with patient and/or caregiver: Patient/Caregiver agrees with plan Daughter reports full comfort care, declined further PT.  GP     Azell Der, PT, DPT 01/04/2013 Pager: [phone]  01/04/2013, 2:53 PM

## 2013-01-04 NOTE — Consult Note (Signed)
[facility] Liaison: [facility] room available for [name] [name] tomorrow 02/01/2013. Met with daughter at bedside to confirm interest/desire to transfer. Daughter agreeable to stopping wound vac prior to transfer. Have made Dr. Suanne Marker aware. Will need discharge summary as early as possible in am. RN please call report to [phone]. CSW Erie Noe aware of above. Thank you. [name] Him LCSW [phone]

## 2013-01-04 NOTE — Progress Notes (Signed)
CBG: 35   Treatment: D50 50ml, 15g carb  Symptoms: Shaky and Hungry  Follow-up CBG: Time:823  CBG Result:64 Time: 839    Result: 107  Possible Reasons for Event: Unknown  Comments/MD notified:

## 2013-01-04 NOTE — Progress Notes (Signed)
Patient WJ:[id number] B Tabbert      DOB: [birth year]      [id number]:[id number]   Palliative Medicine Team at [facility] Progress Note    Subjective: Patients non-responsive, eyes closed, moves head with sternal rub, no verbal response, jaundiced. Daughter Claris Che at bedside, states patient grimaces and moans when repositioned.    Filed Vitals:   01/04/13 0940  BP: 125/93  Pulse: 154  Temp: 98.4 F (36.9 C)  Resp: 18   Physical exam: General: decreased responsiveness over past 48 hours HEENT: eyes closed CHEST: BS diminished at bases, CTA presently CVS: tachycardic ABD: distended, wound vac in place SKIN: jaundiced  NEURO: obtunded  Assessment and plan: Patient is 66 yo WF with complicated medical history, as indicated above. 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 Recently admitted from [facility] 12/09/2012 for sepsis and abdominal pain.  Discussion with daughter (HPOA), in agreement with de-escalating medical interventions and focusing on EOL symptom management:    Agreeable with stopping non-essential oral medications, blood draws, and CBG, vital sign and telemetry  monitoring   Would like wound vac to be discontinued  Stop IV antibiotic therapy  Agreeable to maintaining foley catheter and PICC line upon discharge to residential hospice  Symptom Management:  1) Pain/Dyspnea: Dilaudid IV 1 mg every 3 hours as needed 2) Anxiety: Ativan IV 0.5 mg every 4 hours as needed 3) Terminal Secretions: Atropine SL 2 drops every 4 hours as needed 4) Disposition: plan for discharge to [facility] 31-Jan-2013   Time In Time Out Total Time Spent with Patient Total Overall Time  3:00p 3:30p 30 min 30 min   Freddie Breech, CNS-C Palliative Medicine Team [facility] Team Phone: [phone] Pager: [phone]

## 2013-01-05 LAB — CULTURE, BLOOD (ROUTINE X 2): Culture: NO GROWTH

## 2013-01-05 DEATH — deceased

## 2013-01-13 ENCOUNTER — Encounter (INDEPENDENT_AMBULATORY_CARE_PROVIDER_SITE_OTHER): Payer: BC Managed Care – PPO | Admitting: General Surgery

## 2013-01-28 ENCOUNTER — Encounter (INDEPENDENT_AMBULATORY_CARE_PROVIDER_SITE_OTHER): Payer: BC Managed Care – PPO | Admitting: General Surgery

## 2013-02-05 NOTE — Progress Notes (Signed)
Patient passed away in room with family present. PICC line and foley were removed. Eyes were covered in gauze per [employer] service. Patient was transported to funeral home by funeral home service. Patients belongings were brought to funeral home per daughter request.

## 2013-02-05 NOTE — Discharge Summary (Cosign Needed)
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 She was admitted to [facility] with septic shock and respiratory failure from a small bowel perforation from 5/7 through 5/29. 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 She was discharged to [facility] for wound care, physical therapy, and speech therapy. For the two weeks prior to this admission she has experienced some difficulty sleeping, mild confusion, and worsening jaundice. The family is unaware of whether or not she has had a fever, but apparently blood cultures were collected on 6/16 and are positive for GPC's in pairs and clusters. She was also believed to have a UTI. She was started on Septra for a UTI on 6/14 and this was stopped on 6/17. A PICC was placed on 6/17 and cipro IV was started. She went to her surgeons office for a wound check on 01/04/23 and had a normal blood pressure. After she returned to [facility] she was told to go to the ED because she had "low calcium that needed to be treated in the hospital". In the [facility] ED she was found to be notably hypotensive.  She was alert and oriented and denied pain or discomfort. She noted that her abdomen and R leg had been swelling over the prior two weeks. No  dysuria, no cough, no dyspnea.      Hospital Course:  Septic shock  Pt was admitted to CCM service and placed on the modified sepsis protocol with levophed and further IVF avoided due to her massive extravascular volume overload as notef per CCM -Blood cultures were obtained and she was placed on empiric vanc and imipenem -CCM managed pt and stated  no clear source was identified per (UTI vs/ bacteremia vs/ colits vs/ SBP) - as per HPI pt had had  positive blood culture from the SNF (coag neg staph) - urine cx negative x2 but UA was grossly abnormal at admit - pt improved clinically and per ccm sepsis resolved.Her abx were subsequently deescalated to rocephin and she was transferred to out of unit to hospitslist service. Coagulase negative bacteremia  Blood cx repeated per ID 6/24 - per ID "consider getting echo if she has recurrent bacteremia due to concern for endocarditis" - vanc was d/c 6/24 per PCCM  -6/24 blood cultures remained negative Small pleural effusions >> in setting of cirrhosis, ascites, and low albumin state  resp status remains stable, follow  Hx of HTN  Pt is relatively hypotensive at this time  Acute renal failure/?Hepatorenal syndrome  likely from septic shock and ACE inhibitor use and ?hepatorenal >> baseline creatinine 0.79 from 11/26/12 - cr trending up 6/25  -renal US neg  -cr was gradually trended down initially, holding off ivf for given her cirrhosis with hypoalbuminemia.  and further manage -subsequently her Cr began to trend up and she was declining clinically, and the impression was that she possibly had hepatorenal syndrome.  -Pt's husband was at bedside on 6/26 and voiced concern about her multiiple hospitalizations and overall decline, so palliative care for goals was discussed and he and pt agreed to this. -Palliative care was consulted and they followed pt and per family's wishes confirmed that she was DNR and decided on full comfort measures- per palliative  goals of 6/27 pt did not want IVF. -also Per palliative goals meeting 6/27 - Pt DNR  1. Respiratory/Oxygen: as indicated 2. Nutritional Support/Tube Feeds: No 3. Antibiotics: Yes for identified infections 4. Blood Products: if required during this hospitalization only 5. IVF: No 6. Review of Medications to be discontinued: none 7. Labs: as indicated during this hospitalization  8. Telemetry: as indicated during this hospitalizations 9. Consults: spiritual -on follow up her renal function- BUN/Cr were worsening as well as the AMS,, I updated daughter 6/29 at bedside and left messages for husband>>pt deteriorating>>poor prognosis  - She continued to deteriorate, family had made her full comfort care as above and plan was to d/c to [facility] place, but on  7/1 Patient was noted to be non-responsive, B/P via doppler: 56/15, respirations with 10 second periods of apnea and subsequently that day expired and was pronounced dead with family at bedside.Her PCP's office was notified hepatic encephalopathy/ AMS  Had improved - last ammonia was level 6/29- 36  -Pt with worsening of mental status today, I updated daughter 6/29 at bedside and left messages for husband>>pt deteriorating>>poor prognosis  -rising BUN -worsening renal function possibly contributing to AMS, ammonia level wnl  -continued to decline as above, updated family at bedside, and subsequently expired on 7/1 as above. Hyponatremia, mild  likely due to cirrhosis >> improved on follow up. 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  Anemia of critical illness and chronic disease  Hgb 8.5 today- follow t  DM type II, with hypoglycemia  - hypoglycemia secondary to cirrhosis and poor po intake  -D50 for now, palliative to further further clarify goals- ?continue cbg?d5  Dyspepsia  -continue PPI   Time: >14mins  Signed:  Nyazia Canevari C  [facility] 01/14/13, 2:57 PM

## 2013-02-05 NOTE — Progress Notes (Signed)
Patient ZO:[id number] B Lamoreaux      DOB: [birth year]      JWJ:[id number]   Palliative Medicine Team at [facility] Progress Note    Subjective: Patient non-responsive, B/P via doppler: 56/15, respirations with 10 second periods of apnea. Family at bedside.   Filed Vitals:   01/16/13 1019  BP: 56/15  Pulse:   Resp:    Physical exam: General: actively dying CHEST: respirations shallow/unlabored, diminished BS, CTA CVS: thready, bradycardic ABD: BS not audible EXT: dusky mottling of finger and toe tips   Assessment and plan: Patient with ESLD, actively dying, symptoms being managed, appears comfortable. 1) Pain/Dyspnea: Dilaudid IV 1 mg every 3 hours as needed  2) Anxiety: Ativan IV 0.5 mg every 4 hours as needed  3) Terminal Secretions: Atropine SL 2 drops every 4 hours as needed 4) Prognosis: minutes to hours    Time In Time Out Total Time Spent with Patient Total Overall Time  12 noon 12:15p 15 min 15 min   Freddie Breech, CNS-C Palliative Medicine Team [facility] Team Phone: [phone] Pager: [phone]

## 2013-02-05 DEATH — deceased

## 2013-06-08 ENCOUNTER — Encounter: Payer: Self-pay | Admitting: Internal Medicine

## 2013-06-18 NOTE — Progress Notes (Signed)
This encounter was created in error - please disregard.

## 2014-04-21 IMAGING — CT CT ABD-PELV W/ CM
2 of 4 series · 16 of 46 positions shown, 18 images · IV contrast (OMNIPAQUE)
Comparison: 09/18/2012

CLINICAL DATA: Abdominal pain, leukocytosis, recent surgery for
perforated small bowel

CT ABDOMEN AND PELVIS WITH CONTRAST
TECHNIQUE: Multidetector CT imaging of the abdomen and pelvis was
performed following the standard protocol during bolus
administration of intravenous contrast.
Contrast: 100mL OMNIPAQUE IOHEXOL 300 MG/ML  SOLN

[Series 2: rtn a/p with · axial · 0.83mm/px · z∈[-492,-32]mm · 13 of 102 slices shown, 15 images]
[im 5/102  soft-tissue]
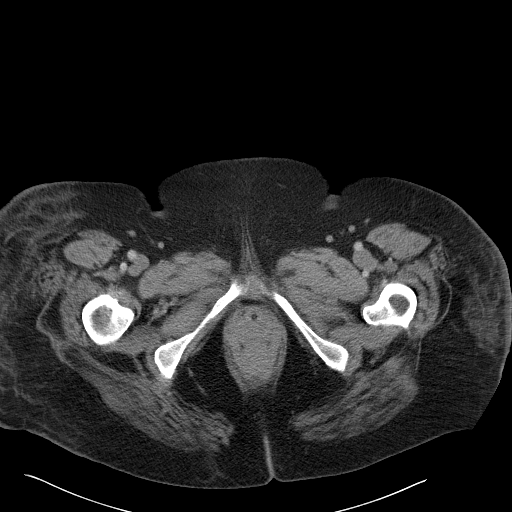
[im 5/102  bone]
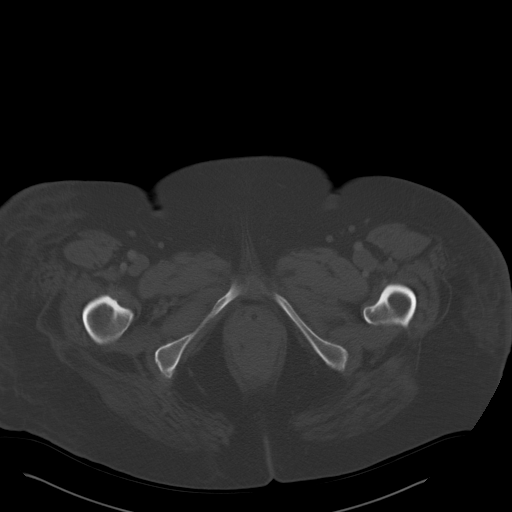
[im 14/102  soft-tissue]
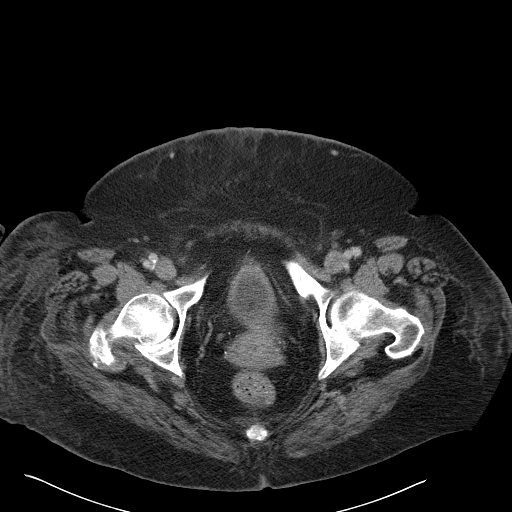
[im 22/102  soft-tissue]
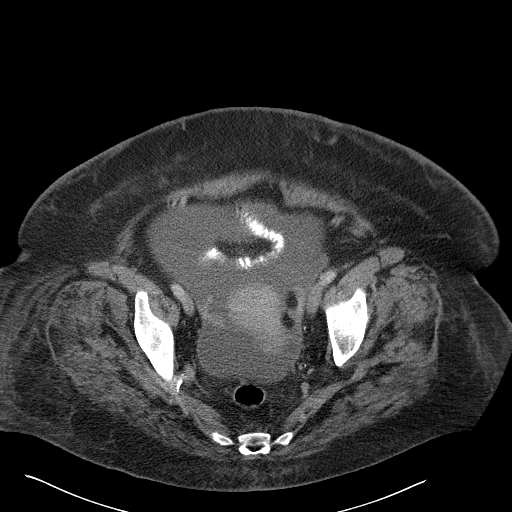
[im 27/102  soft-tissue]
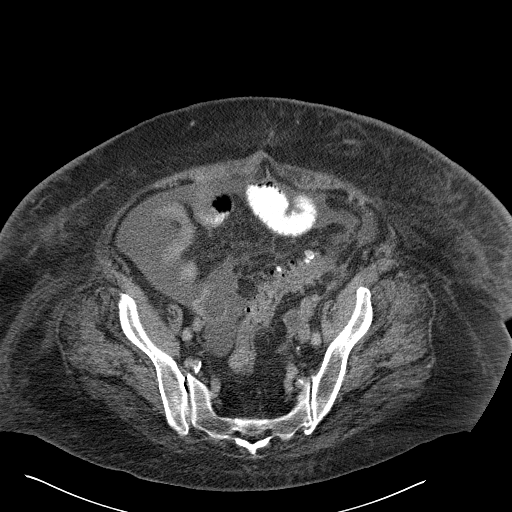
[im 36/102  soft-tissue]
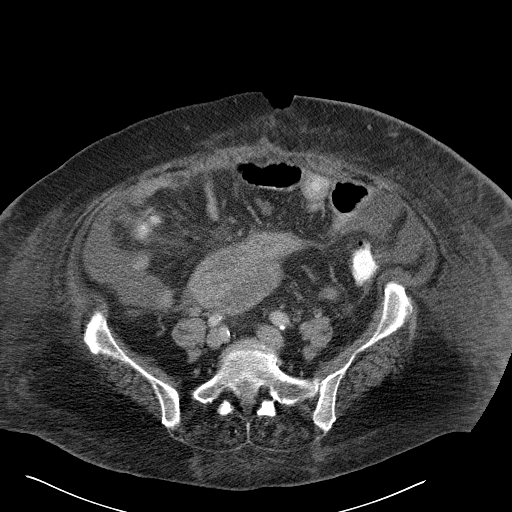
[im 44/102  soft-tissue]
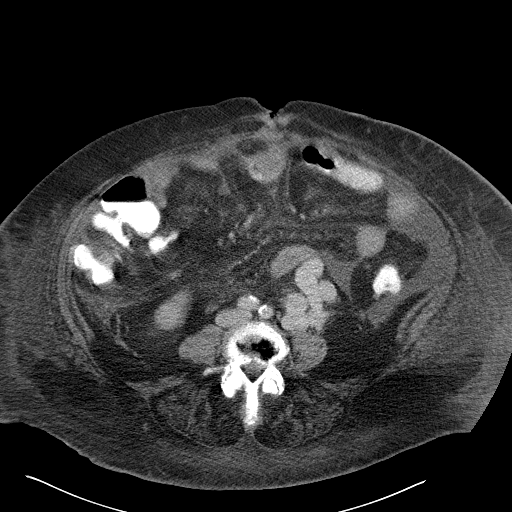
[im 53/102  soft-tissue]
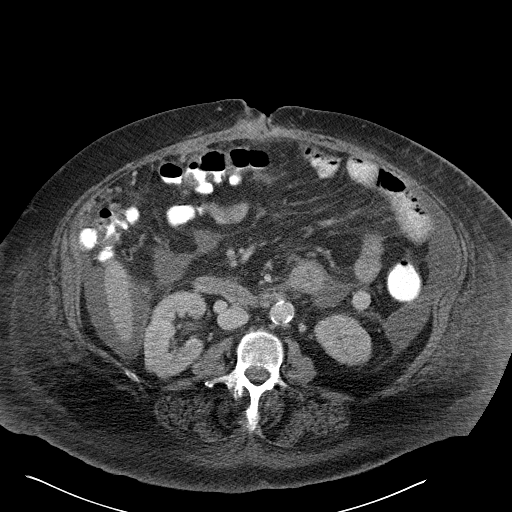
[im 58/102  soft-tissue]
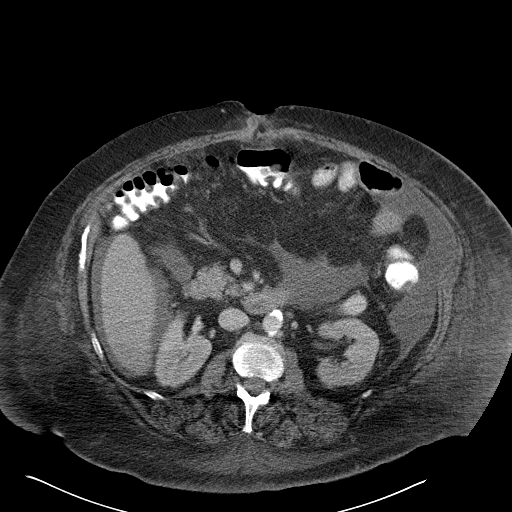
[im 66/102  soft-tissue]
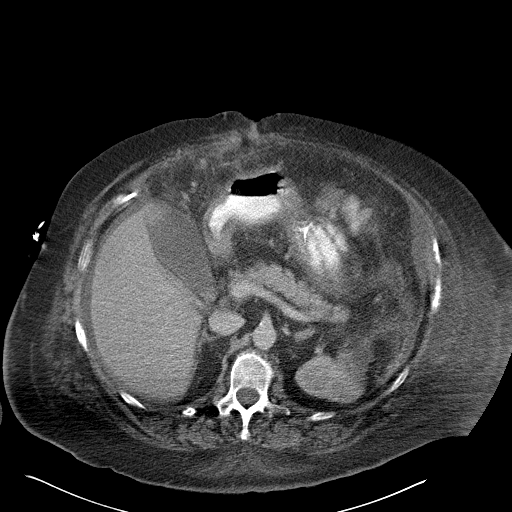
[im 66/102  bone]
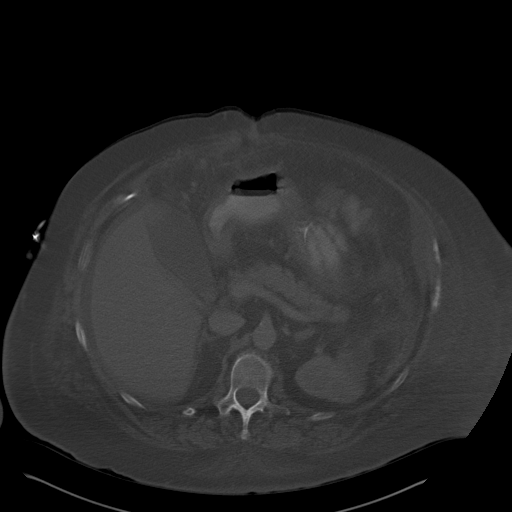
[im 75/102  soft-tissue]
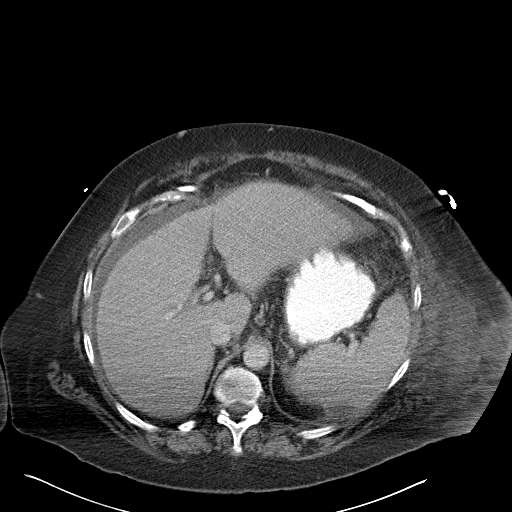
[im 80/102  soft-tissue]
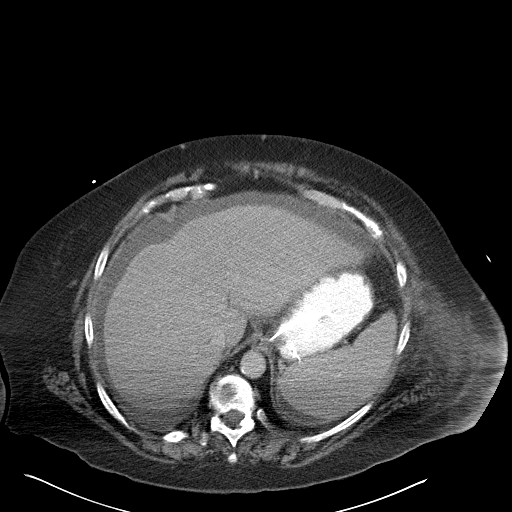
[im 88/102  soft-tissue]
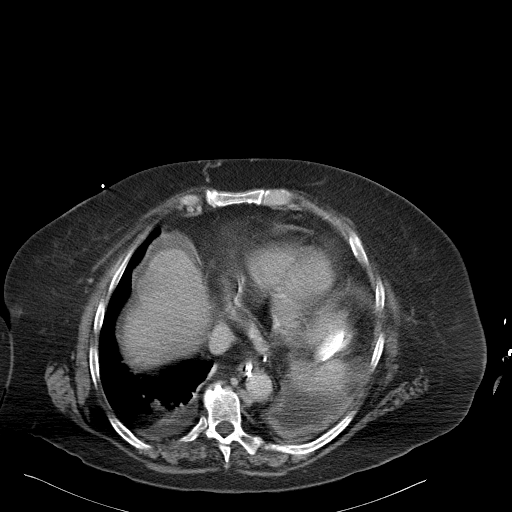
[im 97/102  soft-tissue]
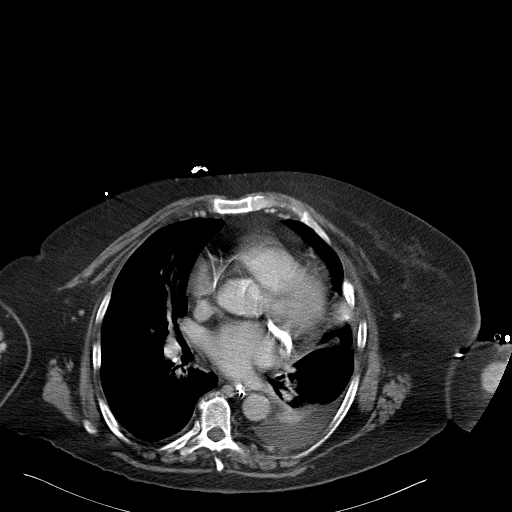

[Series 602: <mpr thick range> · coronal · 1.00mm/px · 3 of 108 slices shown]
[im 36/108  soft-tissue]
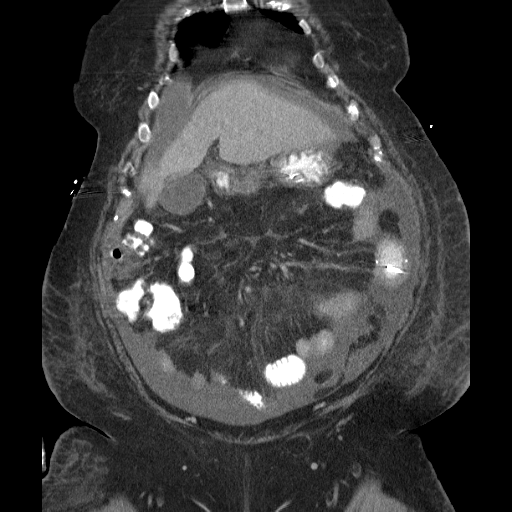
[im 48/108  soft-tissue]
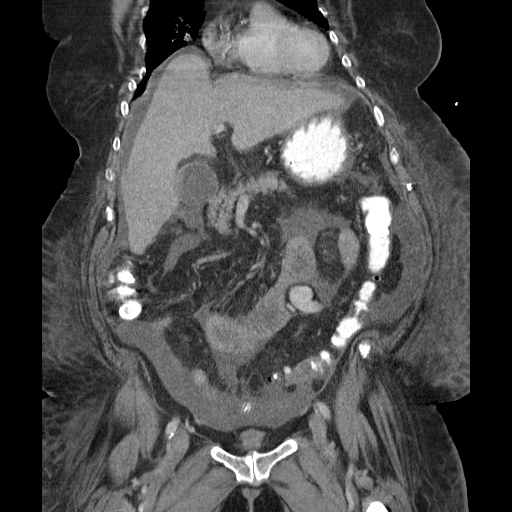
[im 60/108  soft-tissue]
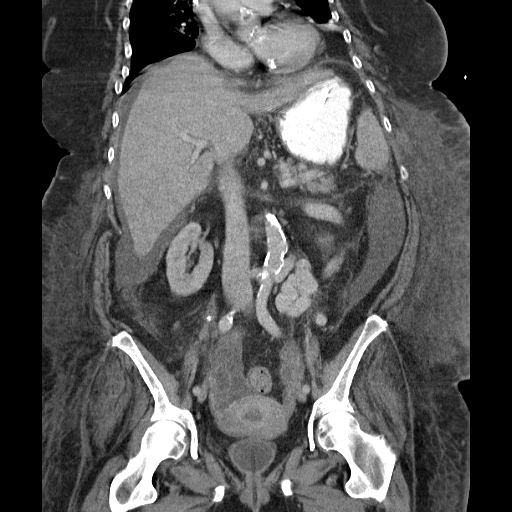

[16 of 46 positions shown; findings below may reference images not displayed]

FINDINGS: Motion degraded images.

Mild patchy opacity in the right lower lobe (series 5/image 7),
suspicious for pneumonia.  Additional patchy opacity in the left
lower lobe (series 5/image 8), suspicious for pneumonia, less
likely compressive atelectasis.

Right basilar opacity, likely atelectasis.  Small left and trace
right pleural effusions.

Cirrhotic configuration of the liver.  1.9 x 1.5 cm hypoenhancing
lesion in the lateral segment left hepatic lobe, previously favored
to reflect a benign hemangioma although indeterminate, unchanged
from 2923.

Spleen, pancreas, and adrenal glands are within normal limits.

Small layering gallstones (series 2/image 38).  No associated
inflammatory changes by CT.

Kidneys are unremarkable.  No renal calculi or hydronephrosis.

No evidence of bowel obstruction.  No contrast extravasation to
suggest leak/perforation following surgery.  Colonic
diverticulosis, without associate inflammatory changes.

No free air.

Atherosclerotic calcifications of the abdominal aorta and branch
vessels. Shunt/collateral vessel in the left mid abdomen (series
2/image 60), between the portosplenic confluence and the IVC.
Portal vein is patent.

Moderate abdominopelvic ascites.

Uterus is mildly heterogeneous, possibly reflecting uterine
fibroids.  No adnexal masses.

Bladder decompressed by indwelling Foley catheter.

Postsurgical changes in the midline anterior abdominal wall.

Body wall edema.

Degenerative changes of the visualized thoracolumbar spine.
IMPRESSION: No oral contrast extravasation to suggest leak/perforation.  No
free air.

Cirrhosis.  Venous collaterals between the portable at confluence
in the IVC.  Portal vein remains patent.

1.9 x 1.5 cm hypoenhancing lesion in the lateral segment left
hepatic lobe, indeterminate, unchanged since 2923.  Correlate with
serum AFP and consider follow-up MRI abdomen with/without contrast
for further characterization as an outpatient.

Moderate abdominopelvic ascites.  Body wall edema.

Suspected right middle lobe and possible left lower lobe pneumonia.
Small left and trace right pleural effusions.

Additional ancillary findings as above.
